# Patient Record
Sex: Male | Born: 1935 | Race: White | Hispanic: No | Marital: Married | Smoking: Former smoker
Health system: Southern US, Community
[De-identification: ages and names within clinical notes are randomized; demographics above are authoritative.]

## PROBLEM LIST (undated history)

## (undated) DIAGNOSIS — F1011 Alcohol abuse, in remission: Secondary | ICD-10-CM

## (undated) DIAGNOSIS — Z8631 Personal history of diabetic foot ulcer: Secondary | ICD-10-CM

## (undated) DIAGNOSIS — Z9181 History of falling: Secondary | ICD-10-CM

## (undated) DIAGNOSIS — I5032 Chronic diastolic (congestive) heart failure: Principal | ICD-10-CM

## (undated) DIAGNOSIS — I10 Essential (primary) hypertension: Secondary | ICD-10-CM

## (undated) DIAGNOSIS — F419 Anxiety disorder, unspecified: Secondary | ICD-10-CM

## (undated) DIAGNOSIS — E785 Hyperlipidemia, unspecified: Secondary | ICD-10-CM

## (undated) DIAGNOSIS — N289 Disorder of kidney and ureter, unspecified: Secondary | ICD-10-CM

## (undated) DIAGNOSIS — Z993 Dependence on wheelchair: Secondary | ICD-10-CM

## (undated) DIAGNOSIS — K219 Gastro-esophageal reflux disease without esophagitis: Secondary | ICD-10-CM

## (undated) DIAGNOSIS — Z860100 Personal history of colon polyps, unspecified: Secondary | ICD-10-CM

## (undated) DIAGNOSIS — M5137 Other intervertebral disc degeneration, lumbosacral region: Secondary | ICD-10-CM

## (undated) DIAGNOSIS — M51379 Other intervertebral disc degeneration, lumbosacral region without mention of lumbar back pain or lower extremity pain: Secondary | ICD-10-CM

## (undated) DIAGNOSIS — K766 Portal hypertension: Secondary | ICD-10-CM

## (undated) DIAGNOSIS — E1142 Type 2 diabetes mellitus with diabetic polyneuropathy: Secondary | ICD-10-CM

## (undated) DIAGNOSIS — F32A Depression, unspecified: Secondary | ICD-10-CM

## (undated) DIAGNOSIS — F418 Other specified anxiety disorders: Secondary | ICD-10-CM

## (undated) DIAGNOSIS — E119 Type 2 diabetes mellitus without complications: Secondary | ICD-10-CM

## (undated) DIAGNOSIS — F329 Major depressive disorder, single episode, unspecified: Secondary | ICD-10-CM

## (undated) DIAGNOSIS — L97319 Non-pressure chronic ulcer of right ankle with unspecified severity: Secondary | ICD-10-CM

## (undated) DIAGNOSIS — I739 Peripheral vascular disease, unspecified: Secondary | ICD-10-CM

## (undated) DIAGNOSIS — Z8614 Personal history of Methicillin resistant Staphylococcus aureus infection: Secondary | ICD-10-CM

## (undated) DIAGNOSIS — Z794 Long term (current) use of insulin: Secondary | ICD-10-CM

## (undated) HISTORY — PX: LAPAROSCOPIC CHOLECYSTECTOMY: SUR755

## (undated) HISTORY — DX: Portal hypertension: K76.6

## (undated) HISTORY — DX: Alcohol abuse, in remission: F10.11

## (undated) HISTORY — DX: Other specified anxiety disorders: F41.8

## (undated) HISTORY — DX: Peripheral vascular disease, unspecified: I73.9

## (undated) HISTORY — DX: Chronic diastolic (congestive) heart failure: I50.32

## (undated) HISTORY — PX: TOE AMPUTATION: SHX809

---

## 1997-06-23 ENCOUNTER — Other Ambulatory Visit: Admission: RE | Admit: 1997-06-23 | Discharge: 1997-06-23 | Payer: Self-pay | Admitting: Cardiology

## 1997-08-31 ENCOUNTER — Other Ambulatory Visit: Admission: RE | Admit: 1997-08-31 | Discharge: 1997-08-31 | Payer: Self-pay | Admitting: Cardiology

## 1997-09-26 ENCOUNTER — Other Ambulatory Visit: Admission: RE | Admit: 1997-09-26 | Discharge: 1997-09-26 | Payer: Self-pay | Admitting: Cardiology

## 1998-04-17 ENCOUNTER — Inpatient Hospital Stay (HOSPITAL_COMMUNITY): Admission: EM | Admit: 1998-04-17 | Discharge: 1998-04-19 | Payer: Self-pay | Admitting: Emergency Medicine

## 1998-04-17 ENCOUNTER — Encounter: Payer: Self-pay | Admitting: Emergency Medicine

## 1998-04-18 ENCOUNTER — Encounter: Payer: Self-pay | Admitting: Cardiology

## 1998-08-17 ENCOUNTER — Ambulatory Visit (HOSPITAL_COMMUNITY): Admission: RE | Admit: 1998-08-17 | Discharge: 1998-08-17 | Payer: Self-pay | Admitting: Psychiatry

## 1999-01-24 ENCOUNTER — Encounter: Admission: RE | Admit: 1999-01-24 | Discharge: 1999-04-24 | Payer: Self-pay | Admitting: Internal Medicine

## 1999-07-17 ENCOUNTER — Encounter: Admission: RE | Admit: 1999-07-17 | Discharge: 1999-10-15 | Payer: Self-pay | Admitting: Internal Medicine

## 1999-08-21 ENCOUNTER — Encounter: Payer: Self-pay | Admitting: Specialist

## 1999-08-21 ENCOUNTER — Encounter: Admission: RE | Admit: 1999-08-21 | Discharge: 1999-08-21 | Payer: Self-pay | Admitting: Specialist

## 1999-10-17 ENCOUNTER — Encounter: Admission: RE | Admit: 1999-10-17 | Discharge: 2000-01-15 | Payer: Self-pay | Admitting: Internal Medicine

## 2000-03-05 ENCOUNTER — Encounter: Admission: RE | Admit: 2000-03-05 | Discharge: 2000-05-26 | Payer: Self-pay | Admitting: Internal Medicine

## 2000-06-11 ENCOUNTER — Encounter: Admission: RE | Admit: 2000-06-11 | Discharge: 2000-09-09 | Payer: Self-pay | Admitting: Internal Medicine

## 2000-10-01 ENCOUNTER — Encounter: Admission: RE | Admit: 2000-10-01 | Discharge: 2000-12-01 | Payer: Self-pay | Admitting: Internal Medicine

## 2001-03-11 ENCOUNTER — Encounter: Admission: RE | Admit: 2001-03-11 | Discharge: 2001-03-16 | Payer: Self-pay | Admitting: Internal Medicine

## 2001-06-10 ENCOUNTER — Encounter (HOSPITAL_BASED_OUTPATIENT_CLINIC_OR_DEPARTMENT_OTHER): Admission: RE | Admit: 2001-06-10 | Discharge: 2001-06-15 | Payer: Self-pay | Admitting: Internal Medicine

## 2001-09-07 ENCOUNTER — Encounter (HOSPITAL_BASED_OUTPATIENT_CLINIC_OR_DEPARTMENT_OTHER): Admission: RE | Admit: 2001-09-07 | Discharge: 2001-09-11 | Payer: Self-pay | Admitting: Internal Medicine

## 2001-11-25 ENCOUNTER — Encounter: Payer: Self-pay | Admitting: General Surgery

## 2001-11-30 ENCOUNTER — Ambulatory Visit (HOSPITAL_COMMUNITY): Admission: RE | Admit: 2001-11-30 | Discharge: 2001-12-01 | Payer: Self-pay | Admitting: General Surgery

## 2001-11-30 HISTORY — PX: INGUINAL HERNIA REPAIR: SUR1180

## 2001-12-28 ENCOUNTER — Encounter (HOSPITAL_BASED_OUTPATIENT_CLINIC_OR_DEPARTMENT_OTHER): Admission: RE | Admit: 2001-12-28 | Discharge: 2002-03-28 | Payer: Self-pay | Admitting: Internal Medicine

## 2002-05-28 ENCOUNTER — Encounter: Payer: Self-pay | Admitting: Cardiology

## 2002-05-28 ENCOUNTER — Encounter: Admission: RE | Admit: 2002-05-28 | Discharge: 2002-05-28 | Payer: Self-pay | Admitting: Cardiology

## 2002-09-21 ENCOUNTER — Ambulatory Visit (HOSPITAL_COMMUNITY): Admission: RE | Admit: 2002-09-21 | Discharge: 2002-09-21 | Payer: Self-pay | Admitting: Gastroenterology

## 2002-09-21 ENCOUNTER — Encounter (INDEPENDENT_AMBULATORY_CARE_PROVIDER_SITE_OTHER): Payer: Self-pay | Admitting: *Deleted

## 2002-09-21 HISTORY — PX: COLONOSCOPY WITH ESOPHAGOGASTRODUODENOSCOPY (EGD): SHX5779

## 2003-05-10 ENCOUNTER — Encounter (HOSPITAL_BASED_OUTPATIENT_CLINIC_OR_DEPARTMENT_OTHER): Admission: RE | Admit: 2003-05-10 | Discharge: 2003-05-16 | Payer: Self-pay | Admitting: Internal Medicine

## 2003-05-17 ENCOUNTER — Inpatient Hospital Stay (HOSPITAL_COMMUNITY): Admission: EM | Admit: 2003-05-17 | Discharge: 2003-05-24 | Payer: Self-pay | Admitting: Emergency Medicine

## 2003-06-21 ENCOUNTER — Encounter (HOSPITAL_BASED_OUTPATIENT_CLINIC_OR_DEPARTMENT_OTHER): Admission: RE | Admit: 2003-06-21 | Discharge: 2003-08-25 | Payer: Self-pay | Admitting: Internal Medicine

## 2003-10-18 ENCOUNTER — Inpatient Hospital Stay (HOSPITAL_COMMUNITY): Admission: EM | Admit: 2003-10-18 | Discharge: 2003-11-01 | Payer: Self-pay

## 2003-11-17 ENCOUNTER — Ambulatory Visit (HOSPITAL_COMMUNITY): Admission: RE | Admit: 2003-11-17 | Discharge: 2003-11-17 | Payer: Self-pay | Admitting: Internal Medicine

## 2004-05-22 ENCOUNTER — Encounter: Admission: RE | Admit: 2004-05-22 | Discharge: 2004-05-22 | Payer: Self-pay | Admitting: Internal Medicine

## 2004-05-23 ENCOUNTER — Ambulatory Visit (HOSPITAL_COMMUNITY): Admission: RE | Admit: 2004-05-23 | Discharge: 2004-05-23 | Payer: Self-pay | Admitting: Internal Medicine

## 2004-07-26 ENCOUNTER — Encounter (HOSPITAL_BASED_OUTPATIENT_CLINIC_OR_DEPARTMENT_OTHER): Admission: RE | Admit: 2004-07-26 | Discharge: 2004-10-09 | Payer: Self-pay | Admitting: Surgery

## 2005-04-02 ENCOUNTER — Ambulatory Visit (HOSPITAL_COMMUNITY): Admission: RE | Admit: 2005-04-02 | Discharge: 2005-04-02 | Payer: Self-pay | Admitting: Internal Medicine

## 2005-07-31 ENCOUNTER — Encounter (HOSPITAL_BASED_OUTPATIENT_CLINIC_OR_DEPARTMENT_OTHER): Admission: RE | Admit: 2005-07-31 | Discharge: 2005-10-29 | Payer: Self-pay | Admitting: Surgery

## 2005-08-02 ENCOUNTER — Ambulatory Visit (HOSPITAL_COMMUNITY): Admission: RE | Admit: 2005-08-02 | Discharge: 2005-08-02 | Payer: Self-pay | Admitting: Internal Medicine

## 2005-08-02 ENCOUNTER — Encounter: Payer: Self-pay | Admitting: Vascular Surgery

## 2005-10-30 ENCOUNTER — Encounter (HOSPITAL_BASED_OUTPATIENT_CLINIC_OR_DEPARTMENT_OTHER): Admission: RE | Admit: 2005-10-30 | Discharge: 2005-11-28 | Payer: Self-pay | Admitting: Surgery

## 2005-12-05 ENCOUNTER — Encounter (HOSPITAL_BASED_OUTPATIENT_CLINIC_OR_DEPARTMENT_OTHER): Admission: RE | Admit: 2005-12-05 | Discharge: 2006-01-06 | Payer: Self-pay | Admitting: Surgery

## 2006-01-16 ENCOUNTER — Encounter (HOSPITAL_BASED_OUTPATIENT_CLINIC_OR_DEPARTMENT_OTHER): Admission: RE | Admit: 2006-01-16 | Discharge: 2006-02-27 | Payer: Self-pay | Admitting: Surgery

## 2006-02-28 ENCOUNTER — Encounter (HOSPITAL_BASED_OUTPATIENT_CLINIC_OR_DEPARTMENT_OTHER): Admission: RE | Admit: 2006-02-28 | Discharge: 2006-03-03 | Payer: Self-pay | Admitting: Surgery

## 2006-03-07 ENCOUNTER — Encounter (HOSPITAL_BASED_OUTPATIENT_CLINIC_OR_DEPARTMENT_OTHER): Admission: RE | Admit: 2006-03-07 | Discharge: 2006-06-05 | Payer: Self-pay | Admitting: Surgery

## 2006-06-10 ENCOUNTER — Encounter (HOSPITAL_BASED_OUTPATIENT_CLINIC_OR_DEPARTMENT_OTHER): Admission: RE | Admit: 2006-06-10 | Discharge: 2006-09-08 | Payer: Self-pay | Admitting: Surgery

## 2006-09-03 ENCOUNTER — Encounter (HOSPITAL_BASED_OUTPATIENT_CLINIC_OR_DEPARTMENT_OTHER): Admission: RE | Admit: 2006-09-03 | Discharge: 2006-09-12 | Payer: Self-pay | Admitting: Surgery

## 2007-01-05 ENCOUNTER — Ambulatory Visit (HOSPITAL_COMMUNITY): Admission: RE | Admit: 2007-01-05 | Discharge: 2007-01-05 | Payer: Self-pay | Admitting: Internal Medicine

## 2007-01-27 ENCOUNTER — Ambulatory Visit (HOSPITAL_COMMUNITY): Admission: RE | Admit: 2007-01-27 | Discharge: 2007-01-27 | Payer: Self-pay | Admitting: Internal Medicine

## 2007-05-28 ENCOUNTER — Ambulatory Visit (HOSPITAL_COMMUNITY): Admission: RE | Admit: 2007-05-28 | Discharge: 2007-05-28 | Payer: Self-pay | Admitting: Internal Medicine

## 2007-07-06 ENCOUNTER — Ambulatory Visit (HOSPITAL_COMMUNITY): Admission: RE | Admit: 2007-07-06 | Discharge: 2007-07-06 | Payer: Self-pay | Admitting: Internal Medicine

## 2008-04-18 ENCOUNTER — Encounter (HOSPITAL_BASED_OUTPATIENT_CLINIC_OR_DEPARTMENT_OTHER): Admission: RE | Admit: 2008-04-18 | Discharge: 2008-07-17 | Payer: Self-pay | Admitting: General Surgery

## 2008-07-19 ENCOUNTER — Encounter (HOSPITAL_BASED_OUTPATIENT_CLINIC_OR_DEPARTMENT_OTHER): Admission: RE | Admit: 2008-07-19 | Discharge: 2008-10-17 | Payer: Self-pay | Admitting: Internal Medicine

## 2008-10-01 ENCOUNTER — Ambulatory Visit (HOSPITAL_COMMUNITY): Admission: RE | Admit: 2008-10-01 | Discharge: 2008-10-01 | Payer: Self-pay | Admitting: Internal Medicine

## 2008-10-19 ENCOUNTER — Encounter (HOSPITAL_BASED_OUTPATIENT_CLINIC_OR_DEPARTMENT_OTHER): Admission: RE | Admit: 2008-10-19 | Discharge: 2008-12-01 | Payer: Self-pay | Admitting: Internal Medicine

## 2008-12-22 ENCOUNTER — Inpatient Hospital Stay (HOSPITAL_COMMUNITY): Admission: EM | Admit: 2008-12-22 | Discharge: 2008-12-26 | Payer: Self-pay | Admitting: Emergency Medicine

## 2008-12-29 ENCOUNTER — Encounter (HOSPITAL_BASED_OUTPATIENT_CLINIC_OR_DEPARTMENT_OTHER): Admission: RE | Admit: 2008-12-29 | Discharge: 2009-02-27 | Payer: Self-pay | Admitting: Internal Medicine

## 2009-02-03 ENCOUNTER — Emergency Department (HOSPITAL_COMMUNITY): Admission: EM | Admit: 2009-02-03 | Discharge: 2009-02-03 | Payer: Self-pay | Admitting: Emergency Medicine

## 2009-03-09 ENCOUNTER — Ambulatory Visit: Payer: Self-pay | Admitting: Cardiovascular Disease

## 2009-03-09 ENCOUNTER — Inpatient Hospital Stay (HOSPITAL_COMMUNITY): Admission: EM | Admit: 2009-03-09 | Discharge: 2009-03-27 | Payer: Self-pay | Admitting: Emergency Medicine

## 2009-03-10 ENCOUNTER — Encounter (INDEPENDENT_AMBULATORY_CARE_PROVIDER_SITE_OTHER): Payer: Self-pay | Admitting: Internal Medicine

## 2009-04-12 ENCOUNTER — Encounter (HOSPITAL_BASED_OUTPATIENT_CLINIC_OR_DEPARTMENT_OTHER): Admission: RE | Admit: 2009-04-12 | Discharge: 2009-07-11 | Payer: Self-pay | Admitting: Internal Medicine

## 2009-07-19 ENCOUNTER — Encounter (HOSPITAL_BASED_OUTPATIENT_CLINIC_OR_DEPARTMENT_OTHER): Admission: RE | Admit: 2009-07-19 | Discharge: 2009-10-17 | Payer: Self-pay | Admitting: Internal Medicine

## 2009-10-19 ENCOUNTER — Encounter: Admission: RE | Admit: 2009-10-19 | Discharge: 2009-12-02 | Payer: Self-pay | Admitting: Internal Medicine

## 2009-11-09 ENCOUNTER — Ambulatory Visit (HOSPITAL_COMMUNITY): Admission: RE | Admit: 2009-11-09 | Discharge: 2009-11-09 | Payer: Self-pay | Admitting: Internal Medicine

## 2009-11-14 ENCOUNTER — Ambulatory Visit (HOSPITAL_COMMUNITY): Admission: RE | Admit: 2009-11-14 | Discharge: 2009-11-14 | Payer: Self-pay | Admitting: Internal Medicine

## 2009-11-21 ENCOUNTER — Encounter (HOSPITAL_BASED_OUTPATIENT_CLINIC_OR_DEPARTMENT_OTHER): Payer: Self-pay | Admitting: Internal Medicine

## 2009-11-21 ENCOUNTER — Ambulatory Visit (HOSPITAL_COMMUNITY): Admission: RE | Admit: 2009-11-21 | Discharge: 2009-11-21 | Payer: Self-pay | Admitting: Internal Medicine

## 2009-11-21 ENCOUNTER — Ambulatory Visit: Payer: Self-pay | Admitting: Cardiology

## 2009-12-05 ENCOUNTER — Encounter (HOSPITAL_BASED_OUTPATIENT_CLINIC_OR_DEPARTMENT_OTHER): Admission: RE | Admit: 2009-12-05 | Discharge: 2009-12-29 | Payer: Self-pay | Admitting: Internal Medicine

## 2009-12-15 ENCOUNTER — Emergency Department (HOSPITAL_COMMUNITY): Admission: EM | Admit: 2009-12-15 | Discharge: 2009-12-15 | Payer: Self-pay | Admitting: Emergency Medicine

## 2009-12-20 ENCOUNTER — Encounter (HOSPITAL_BASED_OUTPATIENT_CLINIC_OR_DEPARTMENT_OTHER): Payer: Self-pay | Admitting: General Surgery

## 2009-12-20 ENCOUNTER — Ambulatory Visit: Payer: Self-pay | Admitting: Cardiology

## 2009-12-20 ENCOUNTER — Ambulatory Visit (HOSPITAL_COMMUNITY): Admission: RE | Admit: 2009-12-20 | Discharge: 2009-12-20 | Payer: Self-pay | Admitting: General Surgery

## 2009-12-20 HISTORY — PX: TRANSTHORACIC ECHOCARDIOGRAM: SHX275

## 2010-01-01 ENCOUNTER — Encounter (HOSPITAL_BASED_OUTPATIENT_CLINIC_OR_DEPARTMENT_OTHER): Admission: RE | Admit: 2010-01-01 | Discharge: 2010-01-17 | Payer: Self-pay | Admitting: Internal Medicine

## 2010-01-02 ENCOUNTER — Encounter: Payer: Self-pay | Admitting: Endocrinology

## 2010-01-15 ENCOUNTER — Ambulatory Visit: Payer: Self-pay | Admitting: Endocrinology

## 2010-01-15 DIAGNOSIS — K219 Gastro-esophageal reflux disease without esophagitis: Secondary | ICD-10-CM

## 2010-01-15 DIAGNOSIS — F1011 Alcohol abuse, in remission: Secondary | ICD-10-CM | POA: Insufficient documentation

## 2010-01-15 DIAGNOSIS — I10 Essential (primary) hypertension: Secondary | ICD-10-CM | POA: Insufficient documentation

## 2010-01-15 DIAGNOSIS — E785 Hyperlipidemia, unspecified: Secondary | ICD-10-CM | POA: Insufficient documentation

## 2010-01-15 DIAGNOSIS — I739 Peripheral vascular disease, unspecified: Secondary | ICD-10-CM

## 2010-01-15 DIAGNOSIS — K766 Portal hypertension: Secondary | ICD-10-CM

## 2010-01-15 DIAGNOSIS — E1142 Type 2 diabetes mellitus with diabetic polyneuropathy: Secondary | ICD-10-CM

## 2010-01-15 DIAGNOSIS — D649 Anemia, unspecified: Secondary | ICD-10-CM

## 2010-01-15 DIAGNOSIS — L97409 Non-pressure chronic ulcer of unspecified heel and midfoot with unspecified severity: Secondary | ICD-10-CM | POA: Insufficient documentation

## 2010-01-15 DIAGNOSIS — E876 Hypokalemia: Secondary | ICD-10-CM

## 2010-01-15 DIAGNOSIS — E109 Type 1 diabetes mellitus without complications: Secondary | ICD-10-CM | POA: Insufficient documentation

## 2010-01-15 DIAGNOSIS — E871 Hypo-osmolality and hyponatremia: Secondary | ICD-10-CM | POA: Insufficient documentation

## 2010-01-15 DIAGNOSIS — R0989 Other specified symptoms and signs involving the circulatory and respiratory systems: Secondary | ICD-10-CM | POA: Insufficient documentation

## 2010-01-15 DIAGNOSIS — F329 Major depressive disorder, single episode, unspecified: Secondary | ICD-10-CM

## 2010-01-15 DIAGNOSIS — M545 Low back pain: Secondary | ICD-10-CM

## 2010-01-15 HISTORY — DX: Alcohol abuse, in remission: F10.11

## 2010-01-15 HISTORY — DX: Peripheral vascular disease, unspecified: I73.9

## 2010-01-15 HISTORY — DX: Portal hypertension: K76.6

## 2010-02-07 ENCOUNTER — Encounter: Payer: Self-pay | Admitting: Endocrinology

## 2010-02-12 ENCOUNTER — Ambulatory Visit: Payer: Self-pay | Admitting: Endocrinology

## 2010-02-19 ENCOUNTER — Inpatient Hospital Stay (HOSPITAL_COMMUNITY)
Admission: EM | Admit: 2010-02-19 | Discharge: 2010-02-22 | Payer: Self-pay | Source: Home / Self Care | Attending: Internal Medicine | Admitting: Internal Medicine

## 2010-02-27 ENCOUNTER — Inpatient Hospital Stay (HOSPITAL_COMMUNITY)
Admission: EM | Admit: 2010-02-27 | Discharge: 2010-03-07 | Payer: Self-pay | Source: Home / Self Care | Attending: Internal Medicine | Admitting: Internal Medicine

## 2010-03-07 LAB — GLUCOSE, CAPILLARY
Glucose-Capillary: 112 mg/dL — ABNORMAL HIGH (ref 70–99)
Glucose-Capillary: 172 mg/dL — ABNORMAL HIGH (ref 70–99)
Glucose-Capillary: 129 mg/dL — ABNORMAL HIGH (ref 70–99)
Glucose-Capillary: 145 mg/dL — ABNORMAL HIGH (ref 70–99)

## 2010-03-22 ENCOUNTER — Encounter: Payer: Self-pay | Admitting: Endocrinology

## 2010-03-22 ENCOUNTER — Ambulatory Visit
Admission: RE | Admit: 2010-03-22 | Discharge: 2010-03-22 | Payer: Self-pay | Source: Home / Self Care | Attending: Endocrinology | Admitting: Endocrinology

## 2010-03-25 ENCOUNTER — Encounter (HOSPITAL_BASED_OUTPATIENT_CLINIC_OR_DEPARTMENT_OTHER): Payer: Self-pay | Admitting: Internal Medicine

## 2010-04-05 NOTE — Assessment & Plan Note (Signed)
Summary: 1 MO ROV /NWS #   Vital Signs:  Patient profile:   77 year old male Height:      74 inches (187.96 cm) Weight:      281 pounds (127.73 kg) BMI:     36.21 O2 Sat:      92 % on Room air Temp:     98.3 degrees F (36.83 degrees C) oral Pulse rate:   72 / minute Pulse rhythm:   regular BP sitting:   128 / 72  (left arm) Cuff size:   large  Vitals Entered By: Brenton Grills CMA Duncan Dull) (March 22, 2010 11:31 AM)  O2 Flow:  Room air CC: 1 month F/U/pt is no longer taking Hydroxyine, Lisinopril 20mg , Clonidine, Ceftin, or using Systane eye drops.aj Is Patient Diabetic? Yes   Primary Provider:  Baltazar Najjar MD  CC:  1 month F/U/pt is no longer taking Hydroxyine, Lisinopril 20mg , Clonidine, Ceftin, and or using Systane eye drops.aj.  History of Present Illness: he brings a record of his cbg's from [facility], which i have reviewed today.  despite increasing lantus to 80 units two times a day, he continues to require approx 40 units of as needed novolog, total per day.  pt states he feels well in general.    Current Medications (verified): 1)  Silvasorb  Gel (Wound Dressings) .... Apply To Affected Areas Once Daily 2)  Amitriptyline Hcl 50 Mg Tabs (Amitriptyline Hcl) .Marland Kitchen.. 1 By Mouth At Bedtime 3)  Lexapro 10 Mg Tabs (Escitalopram Oxalate) .Marland Kitchen.. 1 By Mouth Once Daily 4)  Hydroxyzine Hcl 25 Mg Tabs (Hydroxyzine Hcl) .Marland Kitchen.. 1 Every 6 Hours As Needed For Itching 5)  Imdur 60 Mg Xr24h-Tab (Isosorbide Mononitrate) .Marland Kitchen.. 1 By Mouth Once Daily 6)  Lopressor 100 Mg Tabs (Metoprolol Tartrate) .Marland Kitchen.. 1 By Mouth Two Times A Day (150mg ) 7)  Ativan 0.5 Mg Tabs (Lorazepam) .Marland Kitchen.. 1 Every 8 Hours As Needed For Anxiety 8)  Lantus 100 Unit/ml Soln (Insulin Glargine) .... 25 Units At Bedtime 9)  Novolog 100 Unit/ml Soln (Insulin Aspart) .... Before Meals and At Bedtime Sliding Scale 15-20-23-25-28 Units 10)  Zyprexa 5 Mg Tabs (Olanzapine) .Marland Kitchen.. 1 By Mouth At Bedtime 11)  Aspirin Ec 81 Mg Tbec  (Aspirin) .Marland Kitchen.. 1 By Mouth Once Daily 12)  Neurontin 600 Mg Tabs (Gabapentin) .Marland Kitchen.. 1 By Mouth Two Times A Day 13)  Oxycodone Hcl 5 Mg Tabs (Oxycodone Hcl) .Marland Kitchen.. 1 Every 4 Hours As Needed For Pain 14)  Oxycontin 30 Mg Xr12h-Tab (Oxycodone Hcl) .Marland Kitchen.. 1 By Mouth Every 12 Hours As Needed For Pain 15)  Artificial Tears  Soln (Artificial Tear Solution) .Marland Kitchen.. 1 Drop in Each Each Eye Every 6 Hours While Awake 16)  Decubi-Vite  Caps (Multiple Vitamins-Minerals) .... 2 By Mouth Once Daily 17)  Ferrous Sulfate 325 (65 Fe) Mg Tabs (Ferrous Sulfate) .Marland Kitchen.. 1 By Mouth Once Daily 18)  Folic Acid 1 Mg Tabs (Folic Acid) .Marland Kitchen.. 1 By Mouth Once Daily 19)  Lidoderm 5 % Ptch (Lidocaine) .Marland Kitchen.. 1 Patch Every 12 Hours 20)  Lisinopril 40 Mg Tabs (Lisinopril) .Marland Kitchen.. 1 By Mouth Once Daily 21)  Loratadine 10 Mg Tabs (Loratadine) .Marland Kitchen.. 1 By Mouth Once Daily 22)  Milk of Magnesia 400 Mg/63ml Susp (Magnesium Hydroxide) .... As Needed 23)  Miralax  Powd (Polyethylene Glycol 3350) .Marland KitchenMarland KitchenMarland Kitchen 17 Grams Once Daily 24)  Oxybutynin Chloride 10 Mg Xr24h-Tab (Oxybutynin Chloride) .Marland Kitchen.. 1 By Mouth At Bedtime 25)  Senna Lax 8.6 Mg Tabs (Sennosides) .... 2  Tablets By Mouth At Bedtime 26)  Systane Ultra 0.4-0.3 % Soln (Polyethyl Glycol-Propyl Glycol) .Marland Kitchen.. 1 Drop in Each Eye Three Times A Day 27)  Zantac 150 Mg Tabs (Ranitidine Hcl) .Marland Kitchen.. 1 By Mouth At Bedtime 28)  Albuterol Sulfate 0.63 Mg/66ml Nebu (Albuterol Sulfate) .... As Needed Every 4 Hours 29)  Lisinopril 20 Mg Tabs (Lisinopril) .Marland Kitchen.. 1 Tablet By Mouth Once Daily 30)  Clonidine Hcl 0.1 Mg Tabs (Clonidine Hcl) .Marland Kitchen.. 1 Tablet Every 6 Hours As Needed 31)  Furosemide 40 Mg Tabs (Furosemide) .Marland Kitchen.. 1 1/2  Tablets (60mg ) By Mouth Once Daily 32)  Tramadol Hcl 50 Mg  Tabs (Tramadol Hcl) .Marland Kitchen.. 1 Tablet Every 6 Hours As Needed For Pain 33)  Ipratropium Bromide 0.02 % Soln (Ipratropium Bromide) .... As Needed Every 4 Hours For Sob 34)  Hydralazine Hcl 25 Mg Tabs (Hydralazine Hcl) .Marland Kitchen.. 1 Tablet By Mouth Every 6  Hours  Allergies (verified): No Known Drug Allergies  Past History:  Past Medical History: Last updated: 01/15/2010 ANEMIA (ICD-285.9) DIABETIC FOOT ULCER (ICD-707.14) UNSPECIFIED PERIPHERAL VASCULAR DISEASE (ICD-443.9) BACK PAIN, LUMBAR (ICD-724.2) POLYNEUROPATHY (ICD-357.9) HYPONATREMIA (ICD-276.1) DEPRESSION (ICD-311) HYPERTENSION (ICD-401.9) PORTAL HYPERTENSION (ICD-572.3) ALCOHOL ABUSE, IN REMISSION (ICD-305.03) HYPOKALEMIA (ICD-276.8) DYSLIPIDEMIA (ICD-272.4) GERD (ICD-530.81) IDDM (ICD-250.01)  Review of Systems  The patient denies hypoglycemia.    Physical Exam  General:  morbidly obese.  no distress.  in wheelchair Lungs:  Clear to auscultation bilaterally, except for rales at the left base (unchanged). Normal respiratory effort.    Impression & Recommendations:  Problem # 1:  IDDM (ICD-250.01) his cbg's are still too high to see patterns throughout the day.    Medications Added to Medication List This Visit: 1)  Novolog 100 Unit/ml Soln (Insulin aspart) .... Before meals and at bedtime sliding scale 2)  Oxycontin 30 Mg Xr12h-tab (Oxycodone hcl) .Marland Kitchen.. 1 by mouth every 12 hours as needed for pain 3)  Furosemide 40 Mg Tabs (Furosemide) .Marland Kitchen.. 1 1/2  tablets (60mg ) by mouth once daily 4)  Tramadol Hcl 50 Mg Tabs (Tramadol hcl) .Marland Kitchen.. 1 tablet every 6 hours as needed for pain 5)  Ipratropium Bromide 0.02 % Soln (Ipratropium bromide) .... As needed every 4 hours for sob 6)  Hydralazine Hcl 25 Mg Tabs (Hydralazine hcl) .Marland Kitchen.. 1 tablet by mouth every 6 hours 7)  Lantus 100 Unit/ml Soln (Insulin glargine) .... 90 units two times a day  Other Orders: Est. Patient Level III (13244)  Patient Instructions: 1)  check your blood sugar 4 times a day--before the 3 meals, and at bedtime.  also check if you have symptoms of your blood sugar being too high or too low.  please keep a record of the readings and bring it to your next appointment here.  please call [place] sooner if  resident is having cbg < 100. 2)  for now, increase lantus to 90 units two times a day 3)  same prn novolog for now. 4)  Please schedule a follow-up appointment in 2-3 weeks.   Orders Added: 1)  Est. Patient Level III [01027]  Appended Document: 1 MO ROV /NWS # on the way out of the office after appointment, pt stumbles and abrades right forearm.   dressing applied.

## 2010-04-05 NOTE — Assessment & Plan Note (Signed)
Summary: NEW ENDO CONSULT/ DM / MEDICARE/BCBS/MEDICAID [place] /NWS  #   Vital Signs:  Patient profile:   77 year old male Height:      74 inches (187.96 cm) Weight:      292.25 pounds (132.84 kg) BMI:     37.66 O2 Sat:      91 % on Room air Temp:     98.3 degrees F (36.83 degrees C) oral Pulse rate:   67 / minute BP sitting:   146 / 82  (left arm) Cuff size:   large  Vitals Entered By: Brenton Grills CMA Duncan Dull) (January 15, 2010 2:42 PM)  O2 Flow:  Room air CC: New Endo/DM/[facility]/aj   Primary [name]:  Baltazar Najjar MD  CC:  New Endo/DM/[facility]/aj.  History of Present Illness: pt states many years h/o dm.  he has several chronic complications, as liosted under [facility].  he has been on insulin x "a few years."  he takes lantus and prn novolog.  my nurse has obtained a cbg record. it varies from high-100's to mid-300's.   pt says his diet is provided to him at [facility].  his exercise is severely limited by being confined to a wheelchair.   symptomatically, pt states a few years of mild easy bruising at the forearms, but no assoc pain.  Current Medications (verified): 1)  Silvasorb  Gel (Wound Dressings) .... Apply To Affected Areas Once Daily 2)  Amitriptyline Hcl 50 Mg Tabs (Amitriptyline Hcl) .Marland Kitchen.. 1 By Mouth At Bedtime 3)  Lexapro 10 Mg Tabs (Escitalopram Oxalate) .Marland Kitchen.. 1 By Mouth Once Daily 4)  Hydroxyzine Hcl 25 Mg Tabs (Hydroxyzine Hcl) .Marland Kitchen.. 1 Every 6 Hours As Needed For Itching 5)  Imdur 60 Mg Xr24h-Tab (Isosorbide Mononitrate) .Marland Kitchen.. 1 By Mouth Once Daily 6)  Lopressor 100 Mg Tabs (Metoprolol Tartrate) .Marland Kitchen.. 1 By Mouth Two Times A Day 7)  Ativan 0.5 Mg Tabs (Lorazepam) .Marland Kitchen.. 1 Every 8 Hours As Needed For Anxiety 8)  Lantus 100 Unit/ml Soln (Insulin Glargine) .... 50 Units Two Times A Day 9)  Novolog 100 Unit/ml Soln (Insulin Aspart) .... Before Meals and At Bedtime Sliding Scale 15-20-23-25-28 Units 10)  Zyprexa 5 Mg Tabs (Olanzapine) .Marland Kitchen.. 1 By Mouth At  Bedtime 11)  Aspirin Ec 81 Mg Tbec (Aspirin) .Marland Kitchen.. 1 By Mouth Once Daily 12)  Neurontin 600 Mg Tabs (Gabapentin) .Marland Kitchen.. 1 By Mouth Two Times A Day 13)  Oxycodone Hcl 5 Mg Tabs (Oxycodone Hcl) .Marland Kitchen.. 1 Every 4 Hours As Needed For Pain 14)  Oxycontin 15 Mg Xr12h-Tab (Oxycodone Hcl) .Marland Kitchen.. 1 By Mouth Every 12 Hours 15)  Artificial Tears  Soln (Artificial Tear Solution) .Marland Kitchen.. 1 Drop in Each Each Eye Every 6 Hours While Awake 16)  Decubi-Vite  Caps (Multiple Vitamins-Minerals) .... 2 By Mouth Once Daily 17)  Ferrous Sulfate 325 (65 Fe) Mg Tabs (Ferrous Sulfate) .Marland Kitchen.. 1 By Mouth Once Daily 18)  Folic Acid 1 Mg Tabs (Folic Acid) .Marland Kitchen.. 1 By Mouth Once Daily 19)  Lidoderm 5 % Ptch (Lidocaine) .Marland Kitchen.. 1 Patch Every 12 Hours 20)  Lisinopril 40 Mg Tabs (Lisinopril) .Marland Kitchen.. 1 By Mouth Once Daily 21)  Loratadine 10 Mg Tabs (Loratadine) .Marland Kitchen.. 1 By Mouth Once Daily 22)  Milk of Magnesia 400 Mg/69ml Susp (Magnesium Hydroxide) .... As Needed 23)  Miralax  Powd (Polyethylene Glycol 3350) .Marland KitchenMarland KitchenMarland Kitchen 17 Grams Once Daily 24)  Oxybutynin Chloride 10 Mg Xr24h-Tab (Oxybutynin Chloride) .Marland Kitchen.. 1 By Mouth At Bedtime 25)  Senna Lax 8.6  Mg Tabs (Sennosides) .... 2 Tablets By Mouth At Bedtime 26)  Systane Ultra 0.4-0.3 % Soln (Polyethyl Glycol-Propyl Glycol) .Marland Kitchen.. 1 Drop in Each Eye Three Times A Day 27)  Zantac 150 Mg Tabs (Ranitidine Hcl) .Marland Kitchen.. 1 By Mouth At Bedtime 28)  Albuterol Sulfate 0.63 Mg/25ml Nebu (Albuterol Sulfate) .... As Needed Every 4 Hours  Allergies (verified): No Known Drug Allergies  Past History:  Past Medical History: ANEMIA (ICD-285.9) DIABETIC FOOT ULCER (ICD-707.14) UNSPECIFIED PERIPHERAL VASCULAR DISEASE (ICD-443.9) BACK PAIN, LUMBAR (ICD-724.2) POLYNEUROPATHY (ICD-357.9) HYPONATREMIA (ICD-276.1) DEPRESSION (ICD-311) HYPERTENSION (ICD-401.9) PORTAL HYPERTENSION (ICD-572.3) ALCOHOL ABUSE, IN REMISSION (ICD-305.03) HYPOKALEMIA (ICD-276.8) DYSLIPIDEMIA (ICD-272.4) GERD (ICD-530.81) IDDM (ICD-250.01)  Family  History: Reviewed history and no changes required. no dm in immediate family  Social History: Reviewed history and no changes required. lives at [facility] married retired  Review of Systems       The patient complains of weight gain and headaches.  The patient denies fever.         denies blurry vision, chest pain, n/v, urinary frequency, cramps, excessive diaphoresis, memory loss, depression, hypoglycemia, and rhinorrhea.  he has chronic sob.  Physical Exam  General:  morbidly obese.  no distress.  in wheelchair Head:  head: no deformity eyes: no periorbital swelling, no proptosis external nose and ears are normal mouth: no lesion seen Neck:  Supple without thyroid enlargement or tenderness.  Lungs:  Clear to auscultation bilaterally, except for rales at the left base. Normal respiratory effort.  Heart:  Regular rate and rhythm without gallops noted. Normal S1,S2.   there is a soft syst murmur Msk:  muscle bulk is grossly normal, but strength is diffusely 4/5.  no obvious joint swelling.   Pulses:  no carotid bruit  Extremities:  legs are bandaged trace right pedal edema and trace left pedal edema.  trace right pedal edema, trace left pedal edema, and mycotic toenails.   Neurologic:  cn 2-12 grossly intact.   readily moves all 4's.   sensation is intact to touch on the feet  Skin:  normal texture and temp.  no rash.  not diaphoretic  Cervical Nodes:  No significant adenopathy.  Psych:  Alert and cooperative; normal mood and affect; normal attention span and concentration.     Impression & Recommendations:  Problem # 1:  IDDM (ICD-250.01) needs increased rx.  Problem # 2:  ABNORMAL CHEST SOUNDS (ICD-786.7) ? new incidental finding  Problem # 3:  DEPRESSION (ICD-311) this complicates the rx of #1, although his residing at [facility] helps a lot  Medications Added to Medication List This Visit: 1)  Silvasorb Gel (Wound dressings) .... Apply to affected  areas once daily 2)  Amitriptyline Hcl 50 Mg Tabs (Amitriptyline hcl) .Marland Kitchen.. 1 by mouth at bedtime 3)  Lexapro 10 Mg Tabs (Escitalopram oxalate) .Marland Kitchen.. 1 by mouth once daily 4)  Hydroxyzine Hcl 25 Mg Tabs (Hydroxyzine hcl) .Marland Kitchen.. 1 every 6 hours as needed for itching 5)  Imdur 60 Mg Xr24h-tab (Isosorbide mononitrate) .Marland Kitchen.. 1 by mouth once daily 6)  Lopressor 100 Mg Tabs (Metoprolol tartrate) .Marland Kitchen.. 1 by mouth two times a day 7)  Ativan 0.5 Mg Tabs (Lorazepam) .Marland Kitchen.. 1 every 8 hours as needed for anxiety 8)  Lantus 100 Unit/ml Soln (Insulin glargine) .... 65 units two times a day 9)  Novolog 100 Unit/ml Soln (Insulin aspart) .... Before meals and at bedtime sliding scale 15-20-23-25-28 units 10)  Zyprexa 5 Mg Tabs (Olanzapine) .Marland Kitchen.. 1 by mouth at bedtime 11)  Aspirin Ec 81 Mg Tbec (Aspirin) .Marland Kitchen.. 1 by mouth once daily 12)  Neurontin 600 Mg Tabs (Gabapentin) .Marland Kitchen.. 1 by mouth two times a day 13)  Oxycodone Hcl 5 Mg Tabs (Oxycodone hcl) .Marland Kitchen.. 1 every 4 hours as needed for pain 14)  Oxycontin 15 Mg Xr12h-tab (Oxycodone hcl) .Marland Kitchen.. 1 by mouth every 12 hours 15)  Artificial Tears Soln (Artificial tear solution) .Marland Kitchen.. 1 drop in each each eye every 6 hours while awake 16)  Decubi-vite Caps (Multiple vitamins-minerals) .... 2 by mouth once daily 17)  Ferrous Sulfate 325 (65 Fe) Mg Tabs (Ferrous sulfate) .Marland Kitchen.. 1 by mouth once daily 18)  Folic Acid 1 Mg Tabs (Folic acid) .Marland Kitchen.. 1 by mouth once daily 19)  Lidoderm 5 % Ptch (Lidocaine) .Marland Kitchen.. 1 patch every 12 hours 20)  Lisinopril 40 Mg Tabs (Lisinopril) .Marland Kitchen.. 1 by mouth once daily 21)  Loratadine 10 Mg Tabs (Loratadine) .Marland Kitchen.. 1 by mouth once daily 22)  Milk of Magnesia 400 Mg/38ml Susp (Magnesium hydroxide) .... As needed 23)  Miralax Powd (Polyethylene glycol 3350) .Marland KitchenMarland KitchenMarland Kitchen 17 grams once daily 24)  Oxybutynin Chloride 10 Mg Xr24h-tab (Oxybutynin chloride) .Marland Kitchen.. 1 by mouth at bedtime 25)  Senna Lax 8.6 Mg Tabs (Sennosides) .... 2 tablets by mouth at bedtime 26)  Systane Ultra 0.4-0.3  % Soln (Polyethyl glycol-propyl glycol) .Marland Kitchen.. 1 drop in each eye three times a day 27)  Zantac 150 Mg Tabs (Ranitidine hcl) .Marland Kitchen.. 1 by mouth at bedtime 28)  Albuterol Sulfate 0.63 Mg/63ml Nebu (Albuterol sulfate) .... As needed every 4 hours  Other Orders: New Patient Level IV (04540)  Patient Instructions: 1)  good diet and exercise habits significanly improve the control of your diabetes.  please let me know if you wish to be referred to a dietician.  high blood sugar is very risky to your health.  you should see an eye doctor every year. 2)  controlling your blood pressure and cholesterol drastically reduces the damage diabetes does to your body.  this also applies to quitting smoking.  please discuss these with your doctor.  you should take an aspirin every day, unless you have been advised by a doctor not to. 3)  check your blood sugar 4 times a day--before the 3 meals, and at bedtime.  also check if you have symptoms of your blood sugar being too high or too low.  please keep a record of the readings and bring it to your next appointment here.  please call us sooner if you are having low blood sugar episodes. 4)  we will need to take this complex situation in stages 5)  for now, increase lantus to 65 units two times a day 6)  same novolog for now. 7)  the rales at the left base need follow-up, if this is a new finding. 8)  Please schedule a follow-up appointment in 1 month.   Orders Added: 1)  New Patient Level IV [[id number]]

## 2010-04-05 NOTE — Miscellaneous (Signed)
Summary: [facility]   Imported By: Sherian Rein 01/19/2010 10:41:22  _____________________________________________________________________  External Attachment:    Type:   Image     Comment:   External Document

## 2010-04-05 NOTE — Assessment & Plan Note (Signed)
Summary: 1 MTH FU---STC   Vital Signs:  Patient profile:   77 year old male Height:      74 inches (187.96 cm) Weight:      292.19 pounds (132.81 kg) BMI:     37.65 O2 Sat:      93 % on Room air Temp:     97.7 degrees F (36.50 degrees C) oral Pulse rate:   86 / minute BP sitting:   160 / 90  (right arm) Cuff size:   large  Vitals Entered By: Brenton Grills CMA Duncan Dull) (February 13, 2010 8:45 AM)  O2 Flow:  Room air CC: 1 month F/U/aj Is Patient Diabetic? Yes   Primary Provider:  Baltazar Najjar MD  CC:  1 month F/U/aj.  History of Present Illness: pt states he feels well in general.   ashley, ma, has called [facility], and obtained a cbg record, which i have reviwed today.  it varies from 170-300, with no trend throughout the day.    Current Medications (verified): 1)  Silvasorb  Gel (Wound Dressings) .... Apply To Affected Areas Once Daily 2)  Amitriptyline Hcl 50 Mg Tabs (Amitriptyline Hcl) .Marland Kitchen.. 1 By Mouth At Bedtime 3)  Lexapro 10 Mg Tabs (Escitalopram Oxalate) .Marland Kitchen.. 1 By Mouth Once Daily 4)  Hydroxyzine Hcl 25 Mg Tabs (Hydroxyzine Hcl) .Marland Kitchen.. 1 Every 6 Hours As Needed For Itching 5)  Imdur 60 Mg Xr24h-Tab (Isosorbide Mononitrate) .Marland Kitchen.. 1 By Mouth Once Daily 6)  Lopressor 100 Mg Tabs (Metoprolol Tartrate) .Marland Kitchen.. 1 By Mouth Two Times A Day (150mg ) 7)  Ativan 0.5 Mg Tabs (Lorazepam) .Marland Kitchen.. 1 Every 8 Hours As Needed For Anxiety 8)  Lantus 100 Unit/ml Soln (Insulin Glargine) .... 65 Units Two Times A Day 9)  Novolog 100 Unit/ml Soln (Insulin Aspart) .... Before Meals and At Bedtime Sliding Scale 15-20-23-25-28 Units 10)  Zyprexa 5 Mg Tabs (Olanzapine) .Marland Kitchen.. 1 By Mouth At Bedtime 11)  Aspirin Ec 81 Mg Tbec (Aspirin) .Marland Kitchen.. 1 By Mouth Once Daily 12)  Neurontin 600 Mg Tabs (Gabapentin) .Marland Kitchen.. 1 By Mouth Two Times A Day 13)  Oxycodone Hcl 5 Mg Tabs (Oxycodone Hcl) .Marland Kitchen.. 1 Every 4 Hours As Needed For Pain 14)  Oxycontin 15 Mg Xr12h-Tab (Oxycodone Hcl) .Marland Kitchen.. 1 By Mouth Every 12 Hours 15)   Artificial Tears  Soln (Artificial Tear Solution) .Marland Kitchen.. 1 Drop in Each Each Eye Every 6 Hours While Awake 16)  Decubi-Vite  Caps (Multiple Vitamins-Minerals) .... 2 By Mouth Once Daily 17)  Ferrous Sulfate 325 (65 Fe) Mg Tabs (Ferrous Sulfate) .Marland Kitchen.. 1 By Mouth Once Daily 18)  Folic Acid 1 Mg Tabs (Folic Acid) .Marland Kitchen.. 1 By Mouth Once Daily 19)  Lidoderm 5 % Ptch (Lidocaine) .Marland Kitchen.. 1 Patch Every 12 Hours 20)  Lisinopril 40 Mg Tabs (Lisinopril) .Marland Kitchen.. 1 By Mouth Once Daily 21)  Loratadine 10 Mg Tabs (Loratadine) .Marland Kitchen.. 1 By Mouth Once Daily 22)  Milk of Magnesia 400 Mg/22ml Susp (Magnesium Hydroxide) .... As Needed 23)  Miralax  Powd (Polyethylene Glycol 3350) .Marland KitchenMarland KitchenMarland Kitchen 17 Grams Once Daily 24)  Oxybutynin Chloride 10 Mg Xr24h-Tab (Oxybutynin Chloride) .Marland Kitchen.. 1 By Mouth At Bedtime 25)  Senna Lax 8.6 Mg Tabs (Sennosides) .... 2 Tablets By Mouth At Bedtime 26)  Systane Ultra 0.4-0.3 % Soln (Polyethyl Glycol-Propyl Glycol) .Marland Kitchen.. 1 Drop in Each Eye Three Times A Day 27)  Zantac 150 Mg Tabs (Ranitidine Hcl) .Marland Kitchen.. 1 By Mouth At Bedtime 28)  Albuterol Sulfate 0.63 Mg/26ml Nebu (Albuterol Sulfate) .... As  Needed Every 4 Hours 29)  Lisinopril 20 Mg Tabs (Lisinopril) .Marland Kitchen.. 1 Tablet By Mouth Once Daily 30)  Clonidine Hcl 0.1 Mg Tabs (Clonidine Hcl) .Marland Kitchen.. 1 Tablet Every 6 Hours As Needed 31)  Furosemide 40 Mg Tabs (Furosemide) .Marland Kitchen.. 1 Tablet By Mouth Once Daily 32)  Ceftin 500 Mg Tabs (Cefuroxime Axetil) .Marland Kitchen.. 1 Tablet Two Times A Day X 10 Days  Allergies (verified): No Known Drug Allergies  Past History:  Past Medical History: Last updated: 01/15/2010 ANEMIA (ICD-285.9) DIABETIC FOOT ULCER (ICD-707.14) UNSPECIFIED PERIPHERAL VASCULAR DISEASE (ICD-443.9) BACK PAIN, LUMBAR (ICD-724.2) POLYNEUROPATHY (ICD-357.9) HYPONATREMIA (ICD-276.1) DEPRESSION (ICD-311) HYPERTENSION (ICD-401.9) PORTAL HYPERTENSION (ICD-572.3) ALCOHOL ABUSE, IN REMISSION (ICD-305.03) HYPOKALEMIA (ICD-276.8) DYSLIPIDEMIA (ICD-272.4) GERD  (ICD-530.81) IDDM (ICD-250.01)  Review of Systems  The patient denies hypoglycemia.    Physical Exam  General:  morbidly obese.  no distress.  in wheelchair Skin:  injection sites at the anterior abdomen are without lesions.     Impression & Recommendations:  Problem # 1:  IDDM (ICD-250.01) needs increased rx  Medications Added to Medication List This Visit: 1)  Lopressor 100 Mg Tabs (Metoprolol tartrate) .Marland Kitchen.. 1 by mouth two times a day (150mg ) 2)  Lantus 100 Unit/ml Soln (Insulin glargine) .... 80 units two times a day 3)  Lisinopril 20 Mg Tabs (Lisinopril) .Marland Kitchen.. 1 tablet by mouth once daily 4)  Clonidine Hcl 0.1 Mg Tabs (Clonidine hcl) .Marland Kitchen.. 1 tablet every 6 hours as needed 5)  Furosemide 40 Mg Tabs (Furosemide) .Marland Kitchen.. 1 tablet by mouth once daily 6)  Ceftin 500 Mg Tabs (Cefuroxime axetil) .Marland Kitchen.. 1 tablet two times a day x 10 days  Other Orders: Est. Patient Level III (16109)  Patient Instructions: 1)  check your blood sugar 4 times a day--before the 3 meals, and at bedtime.  also check if you have symptoms of your blood sugar being too high or too low.  please keep a record of the readings and bring it to your next appointment here.  please call [place] sooner if resident is having cbg < 100. 2)  please note it is important to check cbg at hs, as well as qac. 3)  we will need to take this complex situation in stages. 4)  for now, increase lantus to 80 units two times a day 5)  same novolog for now. 6)  Please schedule a follow-up appointment in 1 month.   Orders Added: 1)  Est. Patient Level III [[id number]]

## 2010-04-05 NOTE — Miscellaneous (Signed)
Summary: [facility]   Imported By: Lester Dwight [place] 03/27/2010 12:30:51  _____________________________________________________________________  External Attachment:    Type:   Image     Comment:   External Document

## 2010-04-05 NOTE — Letter (Signed)
Summary: [facility]   [facility]   Imported By: Sherian Rein 02/16/2010 09:01:02  _____________________________________________________________________  External Attachment:    Type:   Image     Comment:   External Document

## 2010-04-12 ENCOUNTER — Ambulatory Visit (INDEPENDENT_AMBULATORY_CARE_PROVIDER_SITE_OTHER): Payer: Medicare Other | Admitting: Endocrinology

## 2010-04-12 ENCOUNTER — Encounter: Payer: Self-pay | Admitting: Endocrinology

## 2010-04-12 DIAGNOSIS — E109 Type 1 diabetes mellitus without complications: Secondary | ICD-10-CM

## 2010-04-12 DIAGNOSIS — M6530 Trigger finger, unspecified finger: Secondary | ICD-10-CM | POA: Insufficient documentation

## 2010-04-19 NOTE — Assessment & Plan Note (Signed)
Summary: 2-3 wk f.u  #/cd   Vital Signs:  Patient profile:   77 year old male Height:      74 inches (187.96 cm) Weight:      272.25 pounds (123.75 kg) BMI:     35.08 O2 Sat:      95 % on Room air Temp:     100.0 degrees F (37.78 degrees C) oral Pulse rate:   68 / minute Pulse rhythm:   regular BP sitting:   130 / 78  (left arm) Cuff size:   large  Vitals Entered By: Brenton Grills CMA Duncan Dull) (April 12, 2010 11:15 AM)  O2 Flow:  Room air CC: 2-3 week F/U/aj Is Patient Diabetic? Yes   Primary Provider:  Baltazar Najjar MD  CC:  2-3 week F/U/aj.  History of Present Illness: pt states he feels well in general, except for few weeks of slight "trigger finger," symptoms of several fingers of the left hand.  no assoc pain.  [facility], and obtained a copy of the cbg record.  it varies from 69-300.  it is in general lowest in the am.  on some of the entries, it is not possible to determine if the entry is "6u," or "64."    Current Medications (verified): 1)  Silvasorb  Gel (Wound Dressings) .... Apply To Affected Areas Once Daily 2)  Amitriptyline Hcl 50 Mg Tabs (Amitriptyline Hcl) .Marland Kitchen.. 1 By Mouth At Bedtime 3)  Lexapro 10 Mg Tabs (Escitalopram Oxalate) .Marland Kitchen.. 1 By Mouth Once Daily 4)  Hydroxyzine Hcl 25 Mg Tabs (Hydroxyzine Hcl) .Marland Kitchen.. 1 Every 6 Hours As Needed For Itching 5)  Imdur 60 Mg Xr24h-Tab (Isosorbide Mononitrate) .Marland Kitchen.. 1 By Mouth Once Daily 6)  Lopressor 100 Mg Tabs (Metoprolol Tartrate) .Marland Kitchen.. 1 By Mouth Two Times A Day (150mg ) 7)  Ativan 0.5 Mg Tabs (Lorazepam) .Marland Kitchen.. 1 Every 8 Hours As Needed For Anxiety 8)  Novolog 100 Unit/ml Soln (Insulin Aspart) .... Before Meals and At Bedtime Sliding Scale 9)  Zyprexa 5 Mg Tabs (Olanzapine) .Marland Kitchen.. 1 By Mouth At Bedtime 10)  Aspirin Ec 81 Mg Tbec (Aspirin) .Marland Kitchen.. 1 By Mouth Once Daily 11)  Neurontin 600 Mg Tabs (Gabapentin) .Marland Kitchen.. 1 By Mouth Two Times A Day 12)  Oxycodone Hcl 5 Mg Tabs (Oxycodone Hcl) .Marland Kitchen.. 1 Every 4  Hours As Needed For Pain 13)  Oxycontin 30 Mg Xr12h-Tab (Oxycodone Hcl) .Marland Kitchen.. 1 By Mouth Every 12 Hours As Needed For Pain 14)  Artificial Tears  Soln (Artificial Tear Solution) .Marland Kitchen.. 1 Drop in Each Each Eye Every 6 Hours While Awake 15)  Decubi-Vite  Caps (Multiple Vitamins-Minerals) .... 2 By Mouth Once Daily 16)  Ferrous Sulfate 325 (65 Fe) Mg Tabs (Ferrous Sulfate) .Marland Kitchen.. 1 By Mouth Once Daily 17)  Folic Acid 1 Mg Tabs (Folic Acid) .Marland Kitchen.. 1 By Mouth Once Daily 18)  Lidoderm 5 % Ptch (Lidocaine) .Marland Kitchen.. 1 Patch Every 12 Hours 19)  Lisinopril 40 Mg Tabs (Lisinopril) .Marland Kitchen.. 1 By Mouth Once Daily 20)  Loratadine 10 Mg Tabs (Loratadine) .Marland Kitchen.. 1 By Mouth Once Daily 21)  Milk of Magnesia 400 Mg/27ml Susp (Magnesium Hydroxide) .... As Needed 22)  Miralax  Powd (Polyethylene Glycol 3350) .Marland KitchenMarland KitchenMarland Kitchen 17 Grams Once Daily 23)  Oxybutynin Chloride 10 Mg Xr24h-Tab (Oxybutynin Chloride) .Marland Kitchen.. 1 By Mouth At Bedtime 24)  Senna Lax 8.6 Mg Tabs (Sennosides) .... 2 Tablets By Mouth At Bedtime 25)  Systane Ultra 0.4-0.3 % Soln (Polyethyl Glycol-Propyl Glycol) .Marland KitchenMarland KitchenMarland Kitchen  1 Drop in Each Eye Three Times A Day 26)  Zantac 150 Mg Tabs (Ranitidine Hcl) .Marland Kitchen.. 1 By Mouth At Bedtime 27)  Albuterol Sulfate 0.63 Mg/13ml Nebu (Albuterol Sulfate) .... As Needed Every 4 Hours 28)  Lisinopril 20 Mg Tabs (Lisinopril) .Marland Kitchen.. 1 Tablet By Mouth Once Daily 29)  Clonidine Hcl 0.1 Mg Tabs (Clonidine Hcl) .Marland Kitchen.. 1 Tablet Every 6 Hours As Needed 30)  Furosemide 40 Mg Tabs (Furosemide) .Marland Kitchen.. 1 1/2  Tablets (60mg ) By Mouth Once Daily 31)  Tramadol Hcl 50 Mg  Tabs (Tramadol Hcl) .Marland Kitchen.. 1 Tablet Every 6 Hours As Needed For Pain 32)  Ipratropium Bromide 0.02 % Soln (Ipratropium Bromide) .... As Needed Every 4 Hours For Sob 33)  Hydralazine Hcl 25 Mg Tabs (Hydralazine Hcl) .Marland Kitchen.. 1 Tablet By Mouth Every 6 Hours 34)  Lantus 100 Unit/ml Soln (Insulin Glargine) .... 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  The patient denies syncope.    Physical Exam  General:  morbidly obese.  no distress.  in wheelchair Extremities:  left hand: few flexion contractures   Impression & Recommendations:  Problem # 1:  IDDM (ICD-250.01) he needs some adjustment in his therapy  Problem # 2:  TRIGGER FINGER (ICD-727.03) Assessment: New  Other Orders: Est. Patient Level III (16109) Est. Patient Level IV (60454)  Patient Instructions: 1)  check your blood sugar 4 times a day--before the 3 meals, and at bedtime.  also check if you have symptoms of your blood sugar being too high or too low.  please keep a record of the readings and bring it to your next appointment here.  please call [place] sooner if resident is having cbg < 100. 2)  for now, change lantus to 110 units each am, and 70 units each evening. 3)  change prn novolog to: 4)  < 200: none 5)  200's: 2 units 6)  300's:  4 units 7)  over 400: 6 units 8)  Please schedule a follow-up appointment in 2-3 weeks. 9)  please send a legible cbg record with resident to each appointment.  10)  resident is advised to ask pcp if he should have any treatment for his "trigger fingers."   Orders Added: 1)  Est. Patient Level III [09811] 2)  Est. Patient Level IV [91478]

## 2010-05-14 ENCOUNTER — Encounter: Payer: Self-pay | Admitting: Endocrinology

## 2010-05-14 ENCOUNTER — Ambulatory Visit (INDEPENDENT_AMBULATORY_CARE_PROVIDER_SITE_OTHER): Payer: Medicare Other | Admitting: Endocrinology

## 2010-05-14 DIAGNOSIS — E109 Type 1 diabetes mellitus without complications: Secondary | ICD-10-CM

## 2010-05-14 LAB — TSH: TSH: 1.974 u[IU]/mL (ref 0.350–4.500)

## 2010-05-14 LAB — POCT CARDIAC MARKERS: CKMB, poc: 1.6 ng/mL (ref 1.0–8.0)

## 2010-05-14 LAB — POCT I-STAT, CHEM 8
Glucose, Bld: 51 mg/dL — ABNORMAL LOW (ref 70–99)
HCT: 37 % — ABNORMAL LOW (ref 39.0–52.0)
Hemoglobin: 12.6 g/dL — ABNORMAL LOW (ref 13.0–17.0)
Potassium: 3.3 mEq/L — ABNORMAL LOW (ref 3.5–5.1)
Sodium: 145 mEq/L (ref 135–145)
TCO2: 35 mmol/L (ref 0–100)

## 2010-05-14 LAB — WOUND CULTURE

## 2010-05-14 LAB — GLUCOSE, CAPILLARY
Glucose-Capillary: 103 mg/dL — ABNORMAL HIGH (ref 70–99)
Glucose-Capillary: 107 mg/dL — ABNORMAL HIGH (ref 70–99)
Glucose-Capillary: 109 mg/dL — ABNORMAL HIGH (ref 70–99)
Glucose-Capillary: 111 mg/dL — ABNORMAL HIGH (ref 70–99)
Glucose-Capillary: 111 mg/dL — ABNORMAL HIGH (ref 70–99)
Glucose-Capillary: 115 mg/dL — ABNORMAL HIGH (ref 70–99)
Glucose-Capillary: 118 mg/dL — ABNORMAL HIGH (ref 70–99)
Glucose-Capillary: 122 mg/dL — ABNORMAL HIGH (ref 70–99)
Glucose-Capillary: 123 mg/dL — ABNORMAL HIGH (ref 70–99)
Glucose-Capillary: 123 mg/dL — ABNORMAL HIGH (ref 70–99)
Glucose-Capillary: 127 mg/dL — ABNORMAL HIGH (ref 70–99)
Glucose-Capillary: 128 mg/dL — ABNORMAL HIGH (ref 70–99)
Glucose-Capillary: 134 mg/dL — ABNORMAL HIGH (ref 70–99)
Glucose-Capillary: 135 mg/dL — ABNORMAL HIGH (ref 70–99)
Glucose-Capillary: 138 mg/dL — ABNORMAL HIGH (ref 70–99)
Glucose-Capillary: 139 mg/dL — ABNORMAL HIGH (ref 70–99)
Glucose-Capillary: 139 mg/dL — ABNORMAL HIGH (ref 70–99)
Glucose-Capillary: 141 mg/dL — ABNORMAL HIGH (ref 70–99)
Glucose-Capillary: 141 mg/dL — ABNORMAL HIGH (ref 70–99)
Glucose-Capillary: 143 mg/dL — ABNORMAL HIGH (ref 70–99)
Glucose-Capillary: 152 mg/dL — ABNORMAL HIGH (ref 70–99)
Glucose-Capillary: 156 mg/dL — ABNORMAL HIGH (ref 70–99)
Glucose-Capillary: 159 mg/dL — ABNORMAL HIGH (ref 70–99)
Glucose-Capillary: 163 mg/dL — ABNORMAL HIGH (ref 70–99)
Glucose-Capillary: 164 mg/dL — ABNORMAL HIGH (ref 70–99)
Glucose-Capillary: 164 mg/dL — ABNORMAL HIGH (ref 70–99)
Glucose-Capillary: 169 mg/dL — ABNORMAL HIGH (ref 70–99)
Glucose-Capillary: 171 mg/dL — ABNORMAL HIGH (ref 70–99)
Glucose-Capillary: 193 mg/dL — ABNORMAL HIGH (ref 70–99)
Glucose-Capillary: 220 mg/dL — ABNORMAL HIGH (ref 70–99)
Glucose-Capillary: 247 mg/dL — ABNORMAL HIGH (ref 70–99)
Glucose-Capillary: 31 mg/dL — CL (ref 70–99)
Glucose-Capillary: 32 mg/dL — CL (ref 70–99)
Glucose-Capillary: 79 mg/dL (ref 70–99)
Glucose-Capillary: 93 mg/dL (ref 70–99)
Glucose-Capillary: 98 mg/dL (ref 70–99)

## 2010-05-14 LAB — COMPREHENSIVE METABOLIC PANEL
ALT: 12 U/L (ref 0–53)
ALT: 13 U/L (ref 0–53)
ALT: 17 U/L (ref 0–53)
AST: 21 U/L (ref 0–37)
AST: 22 U/L (ref 0–37)
Alkaline Phosphatase: 101 U/L (ref 39–117)
Alkaline Phosphatase: 75 U/L (ref 39–117)
Alkaline Phosphatase: 90 U/L (ref 39–117)
Alkaline Phosphatase: 93 U/L (ref 39–117)
BUN: 19 mg/dL (ref 6–23)
BUN: 29 mg/dL — ABNORMAL HIGH (ref 6–23)
CO2: 31 mEq/L (ref 19–32)
CO2: 31 mEq/L (ref 19–32)
CO2: 33 mEq/L — ABNORMAL HIGH (ref 19–32)
CO2: 35 mEq/L — ABNORMAL HIGH (ref 19–32)
Calcium: 9.1 mg/dL (ref 8.4–10.5)
Calcium: 9.1 mg/dL (ref 8.4–10.5)
Chloride: 102 mEq/L (ref 96–112)
Chloride: 108 mEq/L (ref 96–112)
Chloride: 108 mEq/L (ref 96–112)
Creatinine, Ser: 1.24 mg/dL (ref 0.4–1.5)
Creatinine, Ser: 4.03 mg/dL — ABNORMAL HIGH (ref 0.4–1.5)
GFR calc Af Amer: 18 mL/min — ABNORMAL LOW (ref >60)
GFR calc non Af Amer: 14 mL/min — ABNORMAL LOW (ref >60)
GFR calc non Af Amer: 43 mL/min — ABNORMAL LOW (ref >60)
GFR calc non Af Amer: 46 mL/min — ABNORMAL LOW (ref >60)
GFR calc non Af Amer: 47 mL/min — ABNORMAL LOW (ref >60)
Glucose, Bld: 137 mg/dL — ABNORMAL HIGH (ref 70–99)
Glucose, Bld: 159 mg/dL — ABNORMAL HIGH (ref 70–99)
Glucose, Bld: 42 mg/dL — CL (ref 70–99)
Glucose, Bld: 52 mg/dL — ABNORMAL LOW (ref 70–99)
Glucose, Bld: 77 mg/dL (ref 70–99)
Potassium: 3.3 mEq/L — ABNORMAL LOW (ref 3.5–5.1)
Potassium: 3.6 mEq/L (ref 3.5–5.1)
Potassium: 4.2 mEq/L (ref 3.5–5.1)
Potassium: 4.4 mEq/L (ref 3.5–5.1)
Sodium: 142 mEq/L (ref 135–145)
Sodium: 145 mEq/L (ref 135–145)
Sodium: 146 mEq/L — ABNORMAL HIGH (ref 135–145)
Total Bilirubin: 0.4 mg/dL (ref 0.3–1.2)
Total Bilirubin: 0.5 mg/dL (ref 0.3–1.2)
Total Protein: 6.5 g/dL (ref 6.0–8.3)
Total Protein: 7.5 g/dL (ref 6.0–8.3)

## 2010-05-14 LAB — CBC
HCT: 33.8 % — ABNORMAL LOW (ref 39.0–52.0)
HCT: 36.7 % — ABNORMAL LOW (ref 39.0–52.0)
HCT: 37 % — ABNORMAL LOW (ref 39.0–52.0)
HCT: 40.1 % (ref 39.0–52.0)
Hemoglobin: 11 g/dL — ABNORMAL LOW (ref 13.0–17.0)
Hemoglobin: 11.1 g/dL — ABNORMAL LOW (ref 13.0–17.0)
Hemoglobin: 12.3 g/dL — ABNORMAL LOW (ref 13.0–17.0)
Hemoglobin: 13.3 g/dL (ref 13.0–17.0)
MCH: 30.1 pg (ref 26.0–34.0)
MCH: 30.3 pg (ref 26.0–34.0)
MCH: 30.4 pg (ref 26.0–34.0)
MCH: 30.7 pg (ref 26.0–34.0)
MCH: 30.9 pg (ref 26.0–34.0)
MCHC: 32.3 g/dL (ref 30.0–36.0)
MCHC: 32.8 g/dL (ref 30.0–36.0)
MCHC: 33.2 g/dL (ref 30.0–36.0)
MCHC: 33.2 g/dL (ref 30.0–36.0)
MCV: 93.2 fL (ref 78.0–100.0)
MCV: 96.3 fL (ref 78.0–100.0)
Platelets: 110 10*3/uL — ABNORMAL LOW (ref 150–400)
Platelets: 129 10*3/uL — ABNORMAL LOW (ref 150–400)
Platelets: 135 10*3/uL — ABNORMAL LOW (ref 150–400)
Platelets: 194 10*3/uL (ref 150–400)
RBC: 3.66 MIL/uL — ABNORMAL LOW (ref 4.22–5.81)
RDW: 16.3 % — ABNORMAL HIGH (ref 11.5–15.5)
RDW: 16.4 % — ABNORMAL HIGH (ref 11.5–15.5)
RDW: 16.4 % — ABNORMAL HIGH (ref 11.5–15.5)
RDW: 16.9 % — ABNORMAL HIGH (ref 11.5–15.5)
RDW: 16.9 % — ABNORMAL HIGH (ref 11.5–15.5)
WBC: 10.4 10*3/uL (ref 4.0–10.5)
WBC: 8 10*3/uL (ref 4.0–10.5)
WBC: 8 10*3/uL (ref 4.0–10.5)

## 2010-05-14 LAB — MAGNESIUM
Magnesium: 2.2 mg/dL (ref 1.5–2.5)
Magnesium: 2.9 mg/dL — ABNORMAL HIGH (ref 1.5–2.5)

## 2010-05-14 LAB — BASIC METABOLIC PANEL
BUN: 20 mg/dL (ref 6–23)
BUN: 22 mg/dL (ref 6–23)
BUN: 68 mg/dL — ABNORMAL HIGH (ref 6–23)
BUN: 90 mg/dL — ABNORMAL HIGH (ref 6–23)
CO2: 27 mEq/L (ref 19–32)
CO2: 30 mEq/L (ref 19–32)
CO2: 35 mEq/L — ABNORMAL HIGH (ref 19–32)
Calcium: 8.8 mg/dL (ref 8.4–10.5)
Calcium: 9.2 mg/dL (ref 8.4–10.5)
Chloride: 102 mEq/L (ref 96–112)
Chloride: 106 mEq/L (ref 96–112)
Chloride: 98 mEq/L (ref 96–112)
Creatinine, Ser: 1.42 mg/dL (ref 0.4–1.5)
Creatinine, Ser: 2.01 mg/dL — ABNORMAL HIGH (ref 0.4–1.5)
GFR calc Af Amer: 58 mL/min — ABNORMAL LOW (ref >60)
GFR calc Af Amer: >60 mL/min (ref >60)
GFR calc non Af Amer: 32 mL/min — ABNORMAL LOW (ref >60)
GFR calc non Af Amer: 52 mL/min — ABNORMAL LOW (ref >60)
Glucose, Bld: 132 mg/dL — ABNORMAL HIGH (ref 70–99)
Glucose, Bld: 141 mg/dL — ABNORMAL HIGH (ref 70–99)
Glucose, Bld: 154 mg/dL — ABNORMAL HIGH (ref 70–99)
Potassium: 3.1 mEq/L — ABNORMAL LOW (ref 3.5–5.1)
Potassium: 3.3 mEq/L — ABNORMAL LOW (ref 3.5–5.1)
Potassium: 4.1 mEq/L (ref 3.5–5.1)
Potassium: 4.1 mEq/L (ref 3.5–5.1)
Potassium: 4.3 mEq/L (ref 3.5–5.1)
Potassium: 4.3 mEq/L (ref 3.5–5.1)
Sodium: 138 mEq/L (ref 135–145)
Sodium: 140 mEq/L (ref 135–145)
Sodium: 142 mEq/L (ref 135–145)
Sodium: 142 mEq/L (ref 135–145)

## 2010-05-14 LAB — POCT I-STAT 3, ART BLOOD GAS (G3+)
Acid-Base Excess: 9 mmol/L — ABNORMAL HIGH (ref 0.0–2.0)
O2 Saturation: 93 %
TCO2: 38 mmol/L (ref 0–100)
pO2, Arterial: 70 mmHg — ABNORMAL LOW (ref 80.0–100.0)

## 2010-05-14 LAB — BLOOD GAS, ARTERIAL
Acid-Base Excess: 5.2 mmol/L — ABNORMAL HIGH (ref 0.0–2.0)
Drawn by: 24513
FIO2: 0.32 %
O2 Saturation: 98.6 %
TCO2: 32.7 mmol/L (ref 0–100)
pO2, Arterial: 108 mmHg — ABNORMAL HIGH (ref 80.0–100.0)

## 2010-05-14 LAB — CK TOTAL AND CKMB (NOT AT ARMC)
CK, MB: 1.9 ng/mL (ref 0.3–4.0)
CK, MB: 2.1 ng/mL (ref 0.3–4.0)
CK, MB: 7.6 ng/mL (ref 0.3–4.0)
Relative Index: 1.6 (ref 0.0–2.5)
Relative Index: 2.7 — ABNORMAL HIGH (ref 0.0–2.5)
Total CK: 117 U/L (ref 7–232)
Total CK: 136 U/L (ref 7–232)
Total CK: 143 U/L (ref 7–232)

## 2010-05-14 LAB — PHOSPHORUS: Phosphorus: 3.6 mg/dL (ref 2.3–4.6)

## 2010-05-14 LAB — RENAL FUNCTION PANEL
CO2: 30 mEq/L (ref 19–32)
Calcium: 9.6 mg/dL (ref 8.4–10.5)
Creatinine, Ser: 1.38 mg/dL (ref 0.4–1.5)
Glucose, Bld: 178 mg/dL — ABNORMAL HIGH (ref 70–99)
Phosphorus: 2.3 mg/dL (ref 2.3–4.6)
Sodium: 140 mEq/L (ref 135–145)

## 2010-05-14 LAB — DIFFERENTIAL
Basophils Relative: 0 % (ref 0–1)
Basophils Relative: 0 % (ref 0–1)
Eosinophils Absolute: 0 10*3/uL (ref 0.0–0.7)
Eosinophils Absolute: 0.1 10*3/uL (ref 0.0–0.7)
Eosinophils Absolute: 0.4 10*3/uL (ref 0.0–0.7)
Eosinophils Relative: 0 % (ref 0–5)
Eosinophils Relative: 2 % (ref 0–5)
Lymphs Abs: 3.6 10*3/uL (ref 0.7–4.0)
Monocytes Absolute: 0.2 10*3/uL (ref 0.1–1.0)
Monocytes Relative: 1 % — ABNORMAL LOW (ref 3–12)
Monocytes Relative: 10 % (ref 3–12)
Neutro Abs: 12.5 10*3/uL — ABNORMAL HIGH (ref 1.7–7.7)
Neutrophils Relative %: 68 % (ref 43–77)
Neutrophils Relative %: 72 % (ref 43–77)
Neutrophils Relative %: 77 % (ref 43–77)
nRBC: 0 /100 WBC

## 2010-05-14 LAB — CARDIAC PANEL(CRET KIN+CKTOT+MB+TROPI)
Relative Index: 2.2 (ref 0.0–2.5)
Total CK: 212 U/L (ref 7–232)
Troponin I: 0.03 ng/mL (ref 0.00–0.06)

## 2010-05-14 LAB — HEPARIN INDUCED THROMBOCYTOPENIA PNL
Heparin Induced Plt Ab: NEGATIVE
Patient O.D.: 0.14
UFH High Dose UFH H: 0 % Release
UFH Low Dose 0.1 IU/mL: 1 % Release
UFH Low Dose 0.5 IU/mL: 1 % Release
UFH SRA Result: NEGATIVE

## 2010-05-14 LAB — MRSA PCR SCREENING
MRSA by PCR: NEGATIVE
MRSA by PCR: NEGATIVE

## 2010-05-14 LAB — AMMONIA: Ammonia: 16 umol/L (ref 11–35)

## 2010-05-15 LAB — GLUCOSE, CAPILLARY
Glucose-Capillary: 155 mg/dL — ABNORMAL HIGH (ref 70–99)
Glucose-Capillary: 192 mg/dL — ABNORMAL HIGH (ref 70–99)
Glucose-Capillary: 206 mg/dL — ABNORMAL HIGH (ref 70–99)
Glucose-Capillary: 225 mg/dL — ABNORMAL HIGH (ref 70–99)
Glucose-Capillary: 226 mg/dL — ABNORMAL HIGH (ref 70–99)
Glucose-Capillary: 249 mg/dL — ABNORMAL HIGH (ref 70–99)
Glucose-Capillary: 258 mg/dL — ABNORMAL HIGH (ref 70–99)
Glucose-Capillary: 315 mg/dL — ABNORMAL HIGH (ref 70–99)
Glucose-Capillary: 344 mg/dL — ABNORMAL HIGH (ref 70–99)
Glucose-Capillary: 378 mg/dL — ABNORMAL HIGH (ref 70–99)
Glucose-Capillary: 413 mg/dL — ABNORMAL HIGH (ref 70–99)

## 2010-05-16 LAB — CBC
HCT: 35.6 % — ABNORMAL LOW (ref 39.0–52.0)
Platelets: 167 10*3/uL (ref 150–400)
RDW: 16.8 % — ABNORMAL HIGH (ref 11.5–15.5)
WBC: 8.5 10*3/uL (ref 4.0–10.5)

## 2010-05-16 LAB — GLUCOSE, CAPILLARY
Glucose-Capillary: 287 mg/dL — ABNORMAL HIGH (ref 70–99)
Glucose-Capillary: 295 mg/dL — ABNORMAL HIGH (ref 70–99)
Glucose-Capillary: 346 mg/dL — ABNORMAL HIGH (ref 70–99)
Glucose-Capillary: 350 mg/dL — ABNORMAL HIGH (ref 70–99)
Glucose-Capillary: 386 mg/dL — ABNORMAL HIGH (ref 70–99)
Glucose-Capillary: 409 mg/dL — ABNORMAL HIGH (ref 70–99)
Glucose-Capillary: 432 mg/dL — ABNORMAL HIGH (ref 70–99)
Glucose-Capillary: 490 mg/dL — ABNORMAL HIGH (ref 70–99)
Glucose-Capillary: 216 mg/dL — ABNORMAL HIGH (ref 70–99)
Glucose-Capillary: 242 mg/dL — ABNORMAL HIGH (ref 70–99)
Glucose-Capillary: 261 mg/dL — ABNORMAL HIGH (ref 70–99)
Glucose-Capillary: 388 mg/dL — ABNORMAL HIGH (ref 70–99)

## 2010-05-16 LAB — BASIC METABOLIC PANEL
BUN: 18 mg/dL (ref 6–23)
GFR calc non Af Amer: >60 mL/min (ref >60)
Potassium: 3.9 mEq/L (ref 3.5–5.1)

## 2010-05-16 LAB — DIFFERENTIAL
Basophils Absolute: 0 10*3/uL (ref 0.0–0.1)
Lymphocytes Relative: 11 % — ABNORMAL LOW (ref 12–46)
Neutro Abs: 6.8 10*3/uL (ref 1.7–7.7)

## 2010-05-16 LAB — TROPONIN I: Troponin I: 0.05 ng/mL (ref 0.00–0.06)

## 2010-05-16 LAB — CK TOTAL AND CKMB (NOT AT ARMC)
CK, MB: 2.2 ng/mL (ref 0.3–4.0)
Relative Index: INVALID (ref 0.0–2.5)
Total CK: 62 U/L (ref 7–232)

## 2010-05-17 LAB — GLUCOSE, CAPILLARY: Glucose-Capillary: 477 mg/dL — ABNORMAL HIGH (ref 70–99)

## 2010-05-20 LAB — BASIC METABOLIC PANEL
BUN: 10 mg/dL (ref 6–23)
BUN: 25 mg/dL — ABNORMAL HIGH (ref 6–23)
BUN: 75 mg/dL — ABNORMAL HIGH (ref 6–23)
CO2: 23 mEq/L (ref 19–32)
CO2: 25 mEq/L (ref 19–32)
CO2: 26 mEq/L (ref 19–32)
CO2: 26 mEq/L (ref 19–32)
CO2: 28 mEq/L (ref 19–32)
CO2: 20 mEq/L (ref 19–32)
Calcium: 8.1 mg/dL — ABNORMAL LOW (ref 8.4–10.5)
Calcium: 8.4 mg/dL (ref 8.4–10.5)
Calcium: 8.4 mg/dL (ref 8.4–10.5)
Calcium: 8.4 mg/dL (ref 8.4–10.5)
Calcium: 8.6 mg/dL (ref 8.4–10.5)
Chloride: 105 mEq/L (ref 96–112)
Chloride: 107 mEq/L (ref 96–112)
Chloride: 109 mEq/L (ref 96–112)
Chloride: 101 mEq/L (ref 96–112)
Creatinine, Ser: 1.05 mg/dL (ref 0.4–1.5)
Creatinine, Ser: 6.54 mg/dL — ABNORMAL HIGH (ref 0.4–1.5)
Creatinine, Ser: 8.78 mg/dL — ABNORMAL HIGH (ref 0.4–1.5)
Creatinine, Ser: 9.02 mg/dL — ABNORMAL HIGH (ref 0.4–1.5)
GFR calc Af Amer: 18 mL/min — ABNORMAL LOW (ref >60)
GFR calc Af Amer: 7 mL/min — ABNORMAL LOW (ref >60)
GFR calc Af Amer: >60 mL/min (ref >60)
GFR calc Af Amer: 7 mL/min — ABNORMAL LOW (ref >60)
GFR calc non Af Amer: >60 mL/min (ref >60)
Glucose, Bld: 162 mg/dL — ABNORMAL HIGH (ref 70–99)
Glucose, Bld: 197 mg/dL — ABNORMAL HIGH (ref 70–99)
Glucose, Bld: 104 mg/dL — ABNORMAL HIGH (ref 70–99)
Potassium: 3.3 mEq/L — ABNORMAL LOW (ref 3.5–5.1)
Potassium: 3.6 mEq/L (ref 3.5–5.1)
Potassium: 4 mEq/L (ref 3.5–5.1)
Potassium: 5.9 mEq/L — ABNORMAL HIGH (ref 3.5–5.1)
Sodium: 137 mEq/L (ref 135–145)
Sodium: 138 mEq/L (ref 135–145)
Sodium: 141 mEq/L (ref 135–145)
Sodium: 145 mEq/L (ref 135–145)

## 2010-05-20 LAB — URINALYSIS, ROUTINE W REFLEX MICROSCOPIC
Ketones, ur: NEGATIVE mg/dL
Leukocytes, UA: NEGATIVE
Leukocytes, UA: NEGATIVE
Nitrite: NEGATIVE
Nitrite: NEGATIVE
Protein, ur: 100 mg/dL — AB
Protein, ur: >300 mg/dL — AB
Specific Gravity, Urine: 1.027 (ref 1.005–1.030)
Urobilinogen, UA: 0.2 mg/dL (ref 0.0–1.0)
Urobilinogen, UA: 1 mg/dL (ref 0.0–1.0)
pH: 5.5 (ref 5.0–8.0)

## 2010-05-20 LAB — RENAL FUNCTION PANEL
Albumin: 3.1 g/dL — ABNORMAL LOW (ref 3.5–5.2)
Albumin: 3.4 g/dL — ABNORMAL LOW (ref 3.5–5.2)
BUN: 37 mg/dL — ABNORMAL HIGH (ref 6–23)
BUN: 59 mg/dL — ABNORMAL HIGH (ref 6–23)
BUN: 78 mg/dL — ABNORMAL HIGH (ref 6–23)
CO2: 22 mEq/L (ref 19–32)
CO2: 24 mEq/L (ref 19–32)
CO2: 24 mEq/L (ref 19–32)
Calcium: 8.2 mg/dL — ABNORMAL LOW (ref 8.4–10.5)
Calcium: 8.6 mg/dL (ref 8.4–10.5)
Calcium: 7.7 mg/dL — ABNORMAL LOW (ref 8.4–10.5)
Calcium: 7.8 mg/dL — ABNORMAL LOW (ref 8.4–10.5)
Chloride: 96 mEq/L (ref 96–112)
Creatinine, Ser: 10.23 mg/dL — ABNORMAL HIGH (ref 0.4–1.5)
Creatinine, Ser: 12.1 mg/dL — ABNORMAL HIGH (ref 0.4–1.5)
Creatinine, Ser: 11.23 mg/dL — ABNORMAL HIGH (ref 0.4–1.5)
Creatinine, Ser: 15.76 mg/dL — ABNORMAL HIGH (ref 0.4–1.5)
GFR calc Af Amer: 5 mL/min — ABNORMAL LOW (ref >60)
GFR calc Af Amer: 3 mL/min — ABNORMAL LOW (ref >60)
GFR calc Af Amer: 4 mL/min — ABNORMAL LOW (ref >60)
GFR calc non Af Amer: 4 mL/min — ABNORMAL LOW (ref >60)
GFR calc non Af Amer: 3 mL/min — ABNORMAL LOW (ref >60)
GFR calc non Af Amer: 3 mL/min — ABNORMAL LOW (ref >60)
Glucose, Bld: 207 mg/dL — ABNORMAL HIGH (ref 70–99)
Glucose, Bld: 121 mg/dL — ABNORMAL HIGH (ref 70–99)
Phosphorus: 5.4 mg/dL — ABNORMAL HIGH (ref 2.3–4.6)
Phosphorus: 6.8 mg/dL — ABNORMAL HIGH (ref 2.3–4.6)
Phosphorus: 8 mg/dL — ABNORMAL HIGH (ref 2.3–4.6)
Phosphorus: 8.9 mg/dL — ABNORMAL HIGH (ref 2.3–4.6)
Potassium: 4.5 mEq/L (ref 3.5–5.1)
Sodium: 136 mEq/L (ref 135–145)
Sodium: 140 mEq/L (ref 135–145)

## 2010-05-20 LAB — GLUCOSE, CAPILLARY
Glucose-Capillary: 105 mg/dL — ABNORMAL HIGH (ref 70–99)
Glucose-Capillary: 117 mg/dL — ABNORMAL HIGH (ref 70–99)
Glucose-Capillary: 126 mg/dL — ABNORMAL HIGH (ref 70–99)
Glucose-Capillary: 146 mg/dL — ABNORMAL HIGH (ref 70–99)
Glucose-Capillary: 153 mg/dL — ABNORMAL HIGH (ref 70–99)
Glucose-Capillary: 157 mg/dL — ABNORMAL HIGH (ref 70–99)
Glucose-Capillary: 160 mg/dL — ABNORMAL HIGH (ref 70–99)
Glucose-Capillary: 176 mg/dL — ABNORMAL HIGH (ref 70–99)
Glucose-Capillary: 183 mg/dL — ABNORMAL HIGH (ref 70–99)
Glucose-Capillary: 196 mg/dL — ABNORMAL HIGH (ref 70–99)
Glucose-Capillary: 204 mg/dL — ABNORMAL HIGH (ref 70–99)
Glucose-Capillary: 212 mg/dL — ABNORMAL HIGH (ref 70–99)
Glucose-Capillary: 222 mg/dL — ABNORMAL HIGH (ref 70–99)
Glucose-Capillary: 233 mg/dL — ABNORMAL HIGH (ref 70–99)
Glucose-Capillary: 234 mg/dL — ABNORMAL HIGH (ref 70–99)
Glucose-Capillary: 262 mg/dL — ABNORMAL HIGH (ref 70–99)
Glucose-Capillary: 272 mg/dL — ABNORMAL HIGH (ref 70–99)
Glucose-Capillary: 275 mg/dL — ABNORMAL HIGH (ref 70–99)
Glucose-Capillary: 366 mg/dL — ABNORMAL HIGH (ref 70–99)
Glucose-Capillary: 101 mg/dL — ABNORMAL HIGH (ref 70–99)
Glucose-Capillary: 102 mg/dL — ABNORMAL HIGH (ref 70–99)
Glucose-Capillary: 106 mg/dL — ABNORMAL HIGH (ref 70–99)
Glucose-Capillary: 111 mg/dL — ABNORMAL HIGH (ref 70–99)
Glucose-Capillary: 129 mg/dL — ABNORMAL HIGH (ref 70–99)
Glucose-Capillary: 148 mg/dL — ABNORMAL HIGH (ref 70–99)
Glucose-Capillary: 157 mg/dL — ABNORMAL HIGH (ref 70–99)
Glucose-Capillary: 160 mg/dL — ABNORMAL HIGH (ref 70–99)
Glucose-Capillary: 166 mg/dL — ABNORMAL HIGH (ref 70–99)
Glucose-Capillary: 178 mg/dL — ABNORMAL HIGH (ref 70–99)
Glucose-Capillary: 67 mg/dL — ABNORMAL LOW (ref 70–99)
Glucose-Capillary: 73 mg/dL (ref 70–99)
Glucose-Capillary: 79 mg/dL (ref 70–99)
Glucose-Capillary: 98 mg/dL (ref 70–99)
Glucose-Capillary: 98 mg/dL (ref 70–99)

## 2010-05-20 LAB — CBC
HCT: 25 % — ABNORMAL LOW (ref 39.0–52.0)
HCT: 25.3 % — ABNORMAL LOW (ref 39.0–52.0)
HCT: 25.8 % — ABNORMAL LOW (ref 39.0–52.0)
HCT: 26.1 % — ABNORMAL LOW (ref 39.0–52.0)
HCT: 26.1 % — ABNORMAL LOW (ref 39.0–52.0)
HCT: 27.4 % — ABNORMAL LOW (ref 39.0–52.0)
HCT: 30.8 % — ABNORMAL LOW (ref 39.0–52.0)
Hemoglobin: 8.9 g/dL — ABNORMAL LOW (ref 13.0–17.0)
Hemoglobin: 9 g/dL — ABNORMAL LOW (ref 13.0–17.0)
Hemoglobin: 9.3 g/dL — ABNORMAL LOW (ref 13.0–17.0)
Hemoglobin: 9.4 g/dL — ABNORMAL LOW (ref 13.0–17.0)
Hemoglobin: 9.4 g/dL — ABNORMAL LOW (ref 13.0–17.0)
Hemoglobin: 9.9 g/dL — ABNORMAL LOW (ref 13.0–17.0)
MCHC: 35.3 g/dL (ref 30.0–36.0)
MCHC: 35.3 g/dL (ref 30.0–36.0)
MCHC: 35.5 g/dL (ref 30.0–36.0)
MCHC: 35.5 g/dL (ref 30.0–36.0)
MCHC: 35.8 g/dL (ref 30.0–36.0)
MCHC: 36 g/dL (ref 30.0–36.0)
MCV: 91.2 fL (ref 78.0–100.0)
MCV: 91.3 fL (ref 78.0–100.0)
MCV: 91.5 fL (ref 78.0–100.0)
MCV: 92.2 fL (ref 78.0–100.0)
MCV: 92.3 fL (ref 78.0–100.0)
Platelets: 211 10*3/uL (ref 150–400)
Platelets: 74 10*3/uL — ABNORMAL LOW (ref 150–400)
Platelets: 81 10*3/uL — ABNORMAL LOW (ref 150–400)
Platelets: DECREASED 10*3/uL (ref 150–400)
RBC: 2.74 MIL/uL — ABNORMAL LOW (ref 4.22–5.81)
RBC: 2.74 MIL/uL — ABNORMAL LOW (ref 4.22–5.81)
RBC: 2.83 MIL/uL — ABNORMAL LOW (ref 4.22–5.81)
RBC: 3.01 MIL/uL — ABNORMAL LOW (ref 4.22–5.81)
RBC: 3.38 MIL/uL — ABNORMAL LOW (ref 4.22–5.81)
RDW: 16.1 % — ABNORMAL HIGH (ref 11.5–15.5)
RDW: 16.2 % — ABNORMAL HIGH (ref 11.5–15.5)
RDW: 16.5 % — ABNORMAL HIGH (ref 11.5–15.5)
RDW: 16.6 % — ABNORMAL HIGH (ref 11.5–15.5)
RDW: 16.8 % — ABNORMAL HIGH (ref 11.5–15.5)
WBC: 4.2 10*3/uL (ref 4.0–10.5)
WBC: 5.3 10*3/uL (ref 4.0–10.5)
WBC: 6.5 10*3/uL (ref 4.0–10.5)

## 2010-05-20 LAB — CK TOTAL AND CKMB (NOT AT ARMC)
CK, MB: 2.1 ng/mL (ref 0.3–4.0)
Relative Index: 1.1 (ref 0.0–2.5)
Total CK: 187 U/L (ref 7–232)

## 2010-05-20 LAB — COMPREHENSIVE METABOLIC PANEL
Albumin: 2.7 g/dL — ABNORMAL LOW (ref 3.5–5.2)
BUN: 23 mg/dL (ref 6–23)
BUN: 38 mg/dL — ABNORMAL HIGH (ref 6–23)
CO2: 26 mEq/L (ref 19–32)
Chloride: 97 mEq/L (ref 96–112)
Creatinine, Ser: 1.28 mg/dL (ref 0.4–1.5)
Creatinine, Ser: 1.83 mg/dL — ABNORMAL HIGH (ref 0.4–1.5)
GFR calc non Af Amer: 36 mL/min — ABNORMAL LOW (ref >60)
Glucose, Bld: 100 mg/dL — ABNORMAL HIGH (ref 70–99)
Total Bilirubin: 0.6 mg/dL (ref 0.3–1.2)
Total Bilirubin: 0.8 mg/dL (ref 0.3–1.2)
Total Protein: 5.5 g/dL — ABNORMAL LOW (ref 6.0–8.3)

## 2010-05-20 LAB — CARDIAC PANEL(CRET KIN+CKTOT+MB+TROPI)
Relative Index: 0.9 (ref 0.0–2.5)
Total CK: 227 U/L (ref 7–232)
Troponin I: 0.05 ng/mL (ref 0.00–0.06)
Troponin I: 0.05 ng/mL (ref 0.00–0.06)

## 2010-05-20 LAB — TSH: TSH: 0.954 u[IU]/mL (ref 0.350–4.500)

## 2010-05-20 LAB — URINE CULTURE: Culture: NO GROWTH

## 2010-05-20 LAB — DIFFERENTIAL
Basophils Absolute: 0 10*3/uL (ref 0.0–0.1)
Lymphocytes Relative: 4 % — ABNORMAL LOW (ref 12–46)
Neutro Abs: 4.7 10*3/uL (ref 1.7–7.7)
Neutrophils Relative %: 89 % — ABNORMAL HIGH (ref 43–77)

## 2010-05-20 LAB — URINE MICROSCOPIC-ADD ON

## 2010-05-20 LAB — POCT CARDIAC MARKERS: Troponin i, poc: <0.05 ng/mL (ref 0.00–0.09)

## 2010-05-20 LAB — POCT I-STAT 3, ART BLOOD GAS (G3+)
Acid-Base Excess: 2 mmol/L (ref 0.0–2.0)
Bicarbonate: 25.5 mEq/L — ABNORMAL HIGH (ref 20.0–24.0)
O2 Saturation: 92 %
pO2, Arterial: 59 mmHg — ABNORMAL LOW (ref 80.0–100.0)

## 2010-05-20 LAB — WOUND CULTURE

## 2010-05-20 LAB — TROPONIN I: Troponin I: 0.02 ng/mL (ref 0.00–0.06)

## 2010-05-20 LAB — VANCOMYCIN, TROUGH: Vancomycin Tr: 59 ug/mL (ref 10.0–20.0)

## 2010-05-20 LAB — CULTURE, BLOOD (ROUTINE X 2): Culture: NO GROWTH

## 2010-05-22 NOTE — Assessment & Plan Note (Signed)
Summary: 1 mos f/u   Vital Signs:  Patient profile:   77 year old male Height:      74 inches (187.96 cm) Weight:      267.38 pounds (121.54 kg) BMI:     34.45 O2 Sat:      95 % on Room air Temp:     98.9 degrees F (37.17 degrees C) oral Pulse rate:   77 / minute Pulse rhythm:   regular BP sitting:   140 / 76  (left arm) Cuff size:   large  Vitals Entered By: Brenton Grills CMA Duncan Dull) (May 14, 2010 11:01 AM)  O2 Flow:  Room air CC: 1 month F/U/episodes of syncope per pt Low CBG/aj Is Patient Diabetic? Yes Comments Pt is no longer taking Lisinopril 20mg , Clonidine or Tramadol   Primary Provider:  Baltazar Najjar MD  CC:  1 month F/U/episodes of syncope per pt Low CBG/aj.  History of Present Illness: pt states he feels well in general, except during hypoglycemia.  he brings a record of his cbg's which i have reviewed today.  it is often 50-70 in the early hrs of the am.   he reports weight gain.    Current Medications (verified): 1)  Woun'dres Hydrogel Wound Dress  Gel (Wound Dressings) .... Apply To Affected Areas Once Daily 2)  Amitriptyline Hcl 50 Mg Tabs (Amitriptyline Hcl) .Marland Kitchen.. 1 By Mouth At Bedtime 3)  Lexapro 20 Mg Tabs (Escitalopram Oxalate) .Marland Kitchen.. 1 Tablet By Mouth Once Daily 4)  Hydroxyzine Hcl 25 Mg Tabs (Hydroxyzine Hcl) .Marland Kitchen.. 1 Every 6 Hours As Needed For Itching 5)  Imdur 60 Mg Xr24h-Tab (Isosorbide Mononitrate) .Marland Kitchen.. 1 By Mouth Once Daily 6)  Lopressor 100 Mg Tabs (Metoprolol Tartrate) .Marland Kitchen.. 1 By Mouth Two Times A Day (150mg ) 7)  Ativan 0.5 Mg Tabs (Lorazepam) .Marland Kitchen.. 1 Every 8 Hours As Needed For Anxiety 8)  Novolog 100 Unit/ml Soln (Insulin Aspart) .... Before Meals and At Bedtime Sliding Scale 9)  Zyprexa 5 Mg Tabs (Olanzapine) .Marland Kitchen.. 1 By Mouth At Bedtime 10)  Aspirin Ec 81 Mg Tbec (Aspirin) .Marland Kitchen.. 1 By Mouth Once Daily 11)  Neurontin 600 Mg Tabs (Gabapentin) .Marland Kitchen.. 1 By Mouth Two Times A Day 12)  Oxycodone Hcl 5 Mg Tabs (Oxycodone Hcl) .Marland Kitchen.. 1 Every 4 Hours As Needed  For Pain 13)  Oxycontin 30 Mg Xr12h-Tab (Oxycodone Hcl) .Marland Kitchen.. 1 By Mouth Every 12 Hours As Needed For Pain 14)  Artificial Tears  Soln (Artificial Tear Solution) .Marland Kitchen.. 1 Drop in Each Each Eye Every 6 Hours While Awake 15)  Decubi-Vite  Caps (Multiple Vitamins-Minerals) .... 2 By Mouth Once Daily 16)  Ferrous Sulfate 325 (65 Fe) Mg Tabs (Ferrous Sulfate) .Marland Kitchen.. 1 By Mouth Once Daily 17)  Folic Acid 1 Mg Tabs (Folic Acid) .Marland Kitchen.. 1 By Mouth Once Daily 18)  Lidoderm 5 % Ptch (Lidocaine) .Marland Kitchen.. 1 Patch Every 12 Hours 19)  Lisinopril 40 Mg Tabs (Lisinopril) .Marland Kitchen.. 1 By Mouth Once Daily 20)  Loratadine 10 Mg Tabs (Loratadine) .Marland Kitchen.. 1 By Mouth Once Daily 21)  Milk of Magnesia 400 Mg/57ml Susp (Magnesium Hydroxide) .... As Needed 22)  Miralax  Powd (Polyethylene Glycol 3350) .Marland KitchenMarland KitchenMarland Kitchen 17 Grams Once Daily 23)  Oxybutynin Chloride 10 Mg Xr24h-Tab (Oxybutynin Chloride) .Marland Kitchen.. 1 By Mouth At Bedtime 24)  Senna Lax 8.6 Mg Tabs (Sennosides) .... 2 Tablets By Mouth At Bedtime 25)  Systane Ultra 0.4-0.3 % Soln (Polyethyl Glycol-Propyl Glycol) .Marland Kitchen.. 1 Drop in Each Eye Three Times A Day 26)  Zantac 150 Mg Tabs (Ranitidine Hcl) .Marland Kitchen.. 1 By Mouth At Bedtime 27)  Albuterol Sulfate 0.63 Mg/86ml Nebu (Albuterol Sulfate) .... As Needed Every 4 Hours 28)  Lisinopril 20 Mg Tabs (Lisinopril) .Marland Kitchen.. 1 Tablet By Mouth Once Daily 29)  Clonidine Hcl 0.1 Mg Tabs (Clonidine Hcl) .Marland Kitchen.. 1 Tablet Every 6 Hours As Needed 30)  Furosemide 40 Mg Tabs (Furosemide) .Marland Kitchen.. 1 1/2  Tablets (60mg ) By Mouth Twice Daily 31)  Tramadol Hcl 50 Mg  Tabs (Tramadol Hcl) .Marland Kitchen.. 1 Tablet Every 6 Hours As Needed For Pain 32)  Ipratropium Bromide 0.02 % Soln (Ipratropium Bromide) .... As Needed Every 4 Hours For Sob 33)  Hydralazine Hcl 25 Mg Tabs (Hydralazine Hcl) .Marland Kitchen.. 1 Tablet By Mouth Every 6 Hours 34)  Lantus 100 Unit/ml Soln (Insulin Glargine) .... 90 Units Two Times A Day 35)  Santyl 250 Unit/gm Oint (Collagenase) .... Apply Once Daily 36)  Cymbalta 30 Mg Cpep (Duloxetine  Hcl) .Marland Kitchen.. 1 Capsule By Mouth At Bedtime  Allergies (verified): No Known Drug Allergies  Past History:  Past Medical History: Last updated: 01/15/2010 ANEMIA (ICD-285.9) DIABETIC FOOT ULCER (ICD-707.14) UNSPECIFIED PERIPHERAL VASCULAR DISEASE (ICD-443.9) BACK PAIN, LUMBAR (ICD-724.2) POLYNEUROPATHY (ICD-357.9) HYPONATREMIA (ICD-276.1) DEPRESSION (ICD-311) HYPERTENSION (ICD-401.9) PORTAL HYPERTENSION (ICD-572.3) ALCOHOL ABUSE, IN REMISSION (ICD-305.03) HYPOKALEMIA (ICD-276.8) DYSLIPIDEMIA (ICD-272.4) GERD (ICD-530.81) IDDM (ICD-250.01)  Review of Systems  The patient denies syncope.    Physical Exam  General:  morbidly obese.  no distress.  in wheelchair. Skin:  injection sites at the anterior abdomen are without lesions.     Impression & Recommendations:  Problem # 1:  IDDM (ICD-250.01) overcontrolled.    Medications Added to Medication List This Visit: 1)  Woun'dres Hydrogel Wound Dress Gel (Wound dressings) .... Apply to affected areas once daily 2)  Lexapro 20 Mg Tabs (Escitalopram oxalate) .Marland Kitchen.. 1 tablet by mouth once daily 3)  Furosemide 40 Mg Tabs (Furosemide) .Marland Kitchen.. 1 1/2  tablets (60mg ) by mouth twice daily 4)  Santyl 250 Unit/gm Oint (Collagenase) .... Apply once daily 5)  Cymbalta 30 Mg Cpep (Duloxetine hcl) .Marland Kitchen.. 1 capsule by mouth at bedtime  Other Orders: Est. Patient Level III (91478)  Patient Instructions: 1)  check your blood sugar 4 times a day--before the 3 meals, and at bedtime.  also check if you have symptoms of your blood sugar being too high or too low.  please keep a record of the readings and bring it to your next appointment here.  please call [place] sooner if resident is having cbg < 100. 2)  reduce lantus to 110 units each am, and 60 units each evening. 3)  continue prn novolog: 4)  < 200: none 5)  200's: 2 units 6)  300's:  4 units 7)  over 400: 6 units 8)  Please schedule a follow-up appointment in 6 weeks. 9)  please send a legible cbg  record with resident to each appointment.    Orders Added: 1)  Est. Patient Level III [29562]

## 2010-05-22 NOTE — Miscellaneous (Signed)
Summary: BG log sheet/[facility]  BG log sheet/[facility]   Imported By: Lester [place] 05/16/2010 08:56:48  _____________________________________________________________________  External Attachment:    Type:   Image     Comment:   External Document

## 2010-05-23 LAB — GLUCOSE, CAPILLARY: Glucose-Capillary: 420 mg/dL — ABNORMAL HIGH (ref 70–99)

## 2010-06-06 LAB — GLUCOSE, CAPILLARY
Glucose-Capillary: 385 mg/dL — ABNORMAL HIGH (ref 70–99)
Glucose-Capillary: 327 mg/dL — ABNORMAL HIGH (ref 70–99)

## 2010-06-07 LAB — BASIC METABOLIC PANEL
BUN: 17 mg/dL (ref 6–23)
BUN: 24 mg/dL — ABNORMAL HIGH (ref 6–23)
CO2: 31 mEq/L (ref 19–32)
CO2: 31 mEq/L (ref 19–32)
Calcium: 8.8 mg/dL (ref 8.4–10.5)
Chloride: 101 mEq/L (ref 96–112)
Creatinine, Ser: 1.43 mg/dL (ref 0.4–1.5)
Creatinine, Ser: 1.55 mg/dL — ABNORMAL HIGH (ref 0.4–1.5)
Creatinine, Ser: 1.55 mg/dL — ABNORMAL HIGH (ref 0.4–1.5)
GFR calc Af Amer: 53 mL/min — ABNORMAL LOW (ref >60)
GFR calc non Af Amer: 44 mL/min — ABNORMAL LOW (ref >60)
GFR calc non Af Amer: 48 mL/min — ABNORMAL LOW (ref >60)
Glucose, Bld: 168 mg/dL — ABNORMAL HIGH (ref 70–99)
Potassium: 3.7 mEq/L (ref 3.5–5.1)
Sodium: 138 mEq/L (ref 135–145)

## 2010-06-07 LAB — CBC
HCT: 33 % — ABNORMAL LOW (ref 39.0–52.0)
HCT: 36.3 % — ABNORMAL LOW (ref 39.0–52.0)
Hemoglobin: 11 g/dL — ABNORMAL LOW (ref 13.0–17.0)
Hemoglobin: 11.1 g/dL — ABNORMAL LOW (ref 13.0–17.0)
Hemoglobin: 12.6 g/dL — ABNORMAL LOW (ref 13.0–17.0)
MCHC: 34.6 g/dL (ref 30.0–36.0)
MCHC: 35.1 g/dL (ref 30.0–36.0)
MCHC: 35.3 g/dL (ref 30.0–36.0)
MCHC: 35.7 g/dL (ref 30.0–36.0)
MCV: 95.1 fL (ref 78.0–100.0)
MCV: 95.2 fL (ref 78.0–100.0)
MCV: 95.3 fL (ref 78.0–100.0)
Platelets: 117 10*3/uL — ABNORMAL LOW (ref 150–400)
Platelets: 130 10*3/uL — ABNORMAL LOW (ref 150–400)
RBC: 3.26 MIL/uL — ABNORMAL LOW (ref 4.22–5.81)
RBC: 3.31 MIL/uL — ABNORMAL LOW (ref 4.22–5.81)
RBC: 3.81 MIL/uL — ABNORMAL LOW (ref 4.22–5.81)
RDW: 15.1 % (ref 11.5–15.5)
RDW: 15.1 % (ref 11.5–15.5)
RDW: 15.2 % (ref 11.5–15.5)
RDW: 15.8 % — ABNORMAL HIGH (ref 11.5–15.5)
WBC: 17.7 10*3/uL — ABNORMAL HIGH (ref 4.0–10.5)

## 2010-06-07 LAB — POCT I-STAT 3, ART BLOOD GAS (G3+)
Acid-Base Excess: 5 mmol/L — ABNORMAL HIGH (ref 0.0–2.0)
Bicarbonate: 31 mEq/L — ABNORMAL HIGH (ref 20.0–24.0)
O2 Saturation: 93 %
Patient temperature: 101
TCO2: 32 mmol/L (ref 0–100)
pH, Arterial: 7.392 (ref 7.350–7.450)
pO2, Arterial: 73 mmHg — ABNORMAL LOW (ref 80.0–100.0)

## 2010-06-07 LAB — COMPREHENSIVE METABOLIC PANEL
ALT: 18 U/L (ref 0–53)
AST: 27 U/L (ref 0–37)
Alkaline Phosphatase: 67 U/L (ref 39–117)
CO2: 29 mEq/L (ref 19–32)
Calcium: 9.3 mg/dL (ref 8.4–10.5)
GFR calc Af Amer: 58 mL/min — ABNORMAL LOW (ref >60)
Glucose, Bld: 133 mg/dL — ABNORMAL HIGH (ref 70–99)
Potassium: 3.8 mEq/L (ref 3.5–5.1)
Sodium: 143 mEq/L (ref 135–145)
Total Protein: 6.9 g/dL (ref 6.0–8.3)

## 2010-06-07 LAB — DIFFERENTIAL
Basophils Absolute: 0 10*3/uL (ref 0.0–0.1)
Basophils Relative: 0 % (ref 0–1)
Basophils Relative: 0 % (ref 0–1)
Basophils Relative: 0 % (ref 0–1)
Eosinophils Absolute: 0.1 10*3/uL (ref 0.0–0.7)
Eosinophils Absolute: 0.3 10*3/uL (ref 0.0–0.7)
Eosinophils Relative: 1 % (ref 0–5)
Eosinophils Relative: 4 % (ref 0–5)
Lymphocytes Relative: 10 % — ABNORMAL LOW (ref 12–46)
Lymphs Abs: 0.8 10*3/uL (ref 0.7–4.0)
Monocytes Absolute: 0.7 10*3/uL (ref 0.1–1.0)
Monocytes Absolute: 1.1 10*3/uL — ABNORMAL HIGH (ref 0.1–1.0)
Monocytes Relative: 10 % (ref 3–12)
Monocytes Relative: 6 % (ref 3–12)
Monocytes Relative: 7 % (ref 3–12)
Neutro Abs: 8.5 10*3/uL — ABNORMAL HIGH (ref 1.7–7.7)
Neutrophils Relative %: 80 % — ABNORMAL HIGH (ref 43–77)

## 2010-06-07 LAB — URINALYSIS, ROUTINE W REFLEX MICROSCOPIC
Ketones, ur: NEGATIVE mg/dL
Protein, ur: 100 mg/dL — AB
Urobilinogen, UA: 1 mg/dL (ref 0.0–1.0)
pH: 8 (ref 5.0–8.0)

## 2010-06-07 LAB — CULTURE, BLOOD (ROUTINE X 2): Culture: NO GROWTH

## 2010-06-07 LAB — URINE CULTURE: Colony Count: 60000

## 2010-06-07 LAB — GLUCOSE, CAPILLARY
Glucose-Capillary: 141 mg/dL — ABNORMAL HIGH (ref 70–99)
Glucose-Capillary: 158 mg/dL — ABNORMAL HIGH (ref 70–99)
Glucose-Capillary: 203 mg/dL — ABNORMAL HIGH (ref 70–99)
Glucose-Capillary: 217 mg/dL — ABNORMAL HIGH (ref 70–99)
Glucose-Capillary: 219 mg/dL — ABNORMAL HIGH (ref 70–99)
Glucose-Capillary: 240 mg/dL — ABNORMAL HIGH (ref 70–99)
Glucose-Capillary: 264 mg/dL — ABNORMAL HIGH (ref 70–99)
Glucose-Capillary: 272 mg/dL — ABNORMAL HIGH (ref 70–99)
Glucose-Capillary: 276 mg/dL — ABNORMAL HIGH (ref 70–99)
Glucose-Capillary: 393 mg/dL — ABNORMAL HIGH (ref 70–99)
Glucose-Capillary: 64 mg/dL — ABNORMAL LOW (ref 70–99)
Glucose-Capillary: 91 mg/dL (ref 70–99)
Glucose-Capillary: 91 mg/dL (ref 70–99)
Glucose-Capillary: 98 mg/dL (ref 70–99)

## 2010-06-07 LAB — CK TOTAL AND CKMB (NOT AT ARMC)
CK, MB: 0.7 ng/mL (ref 0.3–4.0)
Relative Index: INVALID (ref 0.0–2.5)

## 2010-06-07 LAB — CARDIAC PANEL(CRET KIN+CKTOT+MB+TROPI)
Relative Index: INVALID (ref 0.0–2.5)
Total CK: 93 U/L (ref 7–232)
Total CK: 95 U/L (ref 7–232)
Troponin I: 0.02 ng/mL (ref 0.00–0.06)

## 2010-06-07 LAB — URINE MICROSCOPIC-ADD ON

## 2010-06-07 LAB — BRAIN NATRIURETIC PEPTIDE: Pro B Natriuretic peptide (BNP): 333 pg/mL — ABNORMAL HIGH (ref 0.0–100.0)

## 2010-06-07 LAB — PROTIME-INR
INR: 1.16 (ref 0.00–1.49)
Prothrombin Time: 14.7 seconds (ref 11.6–15.2)

## 2010-06-07 LAB — APTT: aPTT: 34 seconds (ref 24–37)

## 2010-06-08 LAB — GLUCOSE, CAPILLARY: Glucose-Capillary: 216 mg/dL — ABNORMAL HIGH (ref 70–99)

## 2010-06-10 LAB — GLUCOSE, CAPILLARY: Glucose-Capillary: 223 mg/dL — ABNORMAL HIGH (ref 70–99)

## 2010-06-25 ENCOUNTER — Other Ambulatory Visit (INDEPENDENT_AMBULATORY_CARE_PROVIDER_SITE_OTHER): Payer: Medicare Other

## 2010-06-25 ENCOUNTER — Encounter: Payer: Self-pay | Admitting: Endocrinology

## 2010-06-25 ENCOUNTER — Ambulatory Visit (INDEPENDENT_AMBULATORY_CARE_PROVIDER_SITE_OTHER): Payer: Medicare Other | Admitting: Endocrinology

## 2010-06-25 VITALS — BP 152/92 | HR 68 | Temp 98.0°F | Wt 270.0 lb

## 2010-06-25 DIAGNOSIS — E109 Type 1 diabetes mellitus without complications: Secondary | ICD-10-CM

## 2010-06-25 DIAGNOSIS — E119 Type 2 diabetes mellitus without complications: Secondary | ICD-10-CM

## 2010-06-25 NOTE — Patient Instructions (Addendum)
blood tests are being ordered for you today.  We will forward your test results to [facility]. Please make a follow-up appointment in 3 months

## 2010-06-25 NOTE — Progress Notes (Signed)
  Subjective:    [name], male    DOB: [birth year], 77 y.o.   MRN: [id number]  HPI pt states he feels well in general--better than at last ov.  Our office has [facility], and has obtained a record of his cbg's.  It varies from 110-400.  It is in general higher as the day goes on.   Past Medical History  Diagnosis Date  . ANEMIA 01/15/2010  . DIABETIC FOOT ULCER 01/15/2010  . UNSPECIFIED PERIPHERAL VASCULAR DISEASE 01/15/2010  . BACK PAIN, LUMBAR 01/15/2010  . POLYNEUROPATHY 01/15/2010  . HYPONATREMIA 01/15/2010  . DEPRESSION 01/15/2010  . HYPERTENSION 01/15/2010  . 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 DYSLIPIDEMIA 01/15/2010  . HYPOKALEMIA 01/15/2010  . GERD 01/15/2010  . IDDM 01/15/2010   No past surgical history on file.  reports that he has quit smoking. He does not have any smokeless tobacco history on file. His alcohol and drug histories not on file. Retired Lives at [employer] [facility]. Married. family history is negative for Diabetes. Allergies not on file  Review of Systems denies hypoglycemia sxs.    Objective:   Physical Exam elderly, frail, no distress.  In wheelchair. Ext:  Bandages present.  No leg edema    Fructosamine=310 (converts to a1c of 6.8) Lab Results  Component Value Date   HGBA1C 6.6* 06/25/2010   Assessment & Plan:  Dm is still overcontrolled.

## 2010-06-26 LAB — FRUCTOSAMINE: Fructosamine: 310 umol/L — ABNORMAL HIGH (ref <285)

## 2010-07-17 NOTE — Assessment & Plan Note (Signed)
[facility]   NAME:  SANTE, BIEDERMANN                  ACCOUNT NO.:  [id number]   MEDICAL RECORD NO.:  [id number]      DATE OF BIRTH:  1929/03/15   PHYSICIAN:  Joanne Gavel, M.D.         VISIT DATE:  08/24/2008                                   OFFICE VISIT   HISTORY OF PRESENT ILLNESS:  Mr. Carpenter is a 77 year old male with multiple  venous ulcerations and possibly also there is an arterial component and  a neuropathic component.   PHYSICAL EXAMINATION:  A left plantar ulcer 1.5 x 1.6, a right heel  ulcer 1.0 x 1.0, a left posterior leg ulceration which is 1.9 x 1.0, and  a left medial ankle 3.5 x 1.4.  Aside from the left lateral leg which  has healthy-appearing base, the others have thick slough and I believe  there will benefit by Santyl dressings.   PLAN:  Puracol and hydrogel to left lateral wound.  The right heel, left  medial, and plantar are wounds dressed with Santyl and Adaptic and to be  changed 3 times a week.  See the patient in 7 days.      Joanne Gavel, M.D.  Electronically Signed     RA/MEDQ  D:  08/24/2008  T:  08/25/2008  Job:  [id number]

## 2010-07-17 NOTE — Assessment & Plan Note (Signed)
[facility]   NAME:  [name], [name]                  ACCOUNT NO.:  [id number]   MEDICAL RECORD NO.:  [id number]      DATE OF BIRTH:  1929/04/17   PHYSICIAN:  Maxwell Caul, M.D.      VISIT DATE:                                   OFFICE VISIT   [name] is a gentleman we have [facility]  for essentially 3 major wounds at this point;  1. He has a wound on the medial aspect of his left ankle just inferior      to the medial malleolus.  2. On the left plantar foot.  3. On the right heel.   All of these wounds have essentially been in the same state and have  been receiving Puracol AG.  There has been some improvement in the  general appearance of all of these wounds.  All of them underwent a  light non-excisional debridement to remove surface slough.  We then  reapplied the Puracol AG to his major wounds.  He is to continue in the  healing sandal.  To the left leg, the Puracol, hydrogel will be covered  by Adaptic, Kerlix, and Coban.  To the right heel, the Puracol, hydrogel  will be covered by a dry dressing.  We will see him again in 2 weeks.           ______________________________  Maxwell Caul, M.D.     MGR/MEDQ  D:  10/20/2008  T:  10/20/2008  Job:  [id number]

## 2010-07-17 NOTE — Consult Note (Signed)
[name], [name]                  ACCOUNT NO.:  [id number]   MEDICAL RECORD NO.:  [id number]          PATIENT TYPE:  REC   LOCATION:  FOOT                         FACILITY:  [facility]   PHYSICIAN:  Jonelle Sports. Cheryll Cockayne, M.D. DATE OF BIRTH:  06-06-1929   DATE OF CONSULTATION:  04/20/2008  DATE OF DISCHARGE:                                 CONSULTATION   HISTORY:  This 77 year old white male who is currently a resident of the  [facility] is seen for management of a plantar ulcer on the  left foot.  He has, in addition, areas of dermatitis extending up on the  inner right ankle and up almost to mid calf and also a small open ulcer  just inferior to the malleolus on that side.   On the right heel posteriorly, he has a small pressure area that is  blister like in appearance.   These apparently have been present for several weeks and most recently  had some topical treatments applied by [facility] (Dr. Leanord Hawking) without improvement and he is now referred for more  aggressive treatment here.   The patient is formally a patient in this facility and has a fairly  detailed history on the file such as simply not available at this moment  due to his inability to provide it.   We do know that he has a history of tuberculosis, chronic asthma, and  emphysema, has had back and neck surgery in the past presumably due to  degenerative disk disease.  He has longstanding diabetes with neuropathy  and insensate feet.  He has history of chronic alcohol abuse with  chronic liver disease on that basis and he has known cataracts.   He does not currently smoke, and of course, where he does not have  access to alcoholic beverage, etc.   Regular medications include Precose, Lantus twice daily, OxyContin,  Ultram, folic acid, multiple vitamin, hydrochlorothiazide, vitamin C,  aspirin, Prilosec, potassium chloride, Lyrica, MiraLax, Pamelor, Zoloft,  Imdur, Lopressor, Lopid,  TriCor, clonidine, Norvasc, lisinopril,  hydralazine, and Artificial Tears.  The exact doses of these are  recorded in the nurses' notes and are not repeated here.   He has no known medicinal allergies.   On examination today, blood pressure is 115/63, pulse 76, respirations  18, temperature 97.8.  This is an elderly gentleman who appears his  stated age and is in no immediate distress.  He does not have jaundice  or ascites despite his known chronic liver disease.  He is cooperative  with the examiner but not reliable in his history that he is able to  give.   In his lower extremities, pulses are palpable but diminished.  He has a  small pressure area on the posterior aspect of his right heel which  measures approximately  0.9 x 0.9 cm and is covered with a blister.   On his left lower extremity, there is evidence of an inflammatory  dermatitis extending from about mid calf on the medial aspect of the leg  down over the  entire medial malleolar area through the instep and onto  the plantar aspect of the foot.  At the malleolus itself, there is a  small ulcer measuring 0.5 x 0.3 x 0.2 cm which is fairly clean in its  base and so forth.   On the plantar aspect of the foot, there is an ulcer which is partially  covered by calloused skin which is very macerated and it is only after  debridement that we can determine the size of this ulcer 3.0 x 2.6 x 0.2  cm.  There is modest drainage but no specific odor.  More proximal to  this, there is a more shallow ulcer measuring 0.9 x 2.0 x 0.1 cm again  covered largely with macerated skin.   IMPRESSION:  Diabetes mellitus with plantar and medial malleolar ulcers  on the left and posterior heel pressure sore on the right.   DISPOSITION:  1. The wound on the right heel is debrided selectively unroofing the      skin and this is then treated with an application of Neosporin and      covered with a protective pad.  2. The wound on the medial  malleolar aspect of the left lower      extremity requires no debridement and it will be treated with an      application of ketoconazole and a small protective pad.  3. The plantar ulcers are both widely debrided selectively of the      macerated skin and loose tissue at the margins of the wound and      then they are treated likewise with applications of ketoconazole      covered then with heavy alginate padding and absorptive pad and      that extremity will be placed in a total contact cast (EZ Cast).  4. The receiving facility will be instructed to change the dressing on      the right heel, cleansing it and replacing it with similar dressing      on a daily basis.  They are encouraged to leave the total contact      cast unattended but to protect it from injury or moisture.  5. Followup visit will be here in 5 days.  6. Incidentally, fungal and bacterial cultures were made from the      plantar ulcer on the left foot.           ______________________________  Jonelle Sports. Cheryll Cockayne, M.D.     RES/MEDQ  D:  04/20/2008  T:  04/21/2008  Job:  [id number]

## 2010-07-17 NOTE — Assessment & Plan Note (Signed)
[facility]   NAME:  [name], [name] NO.:  [id number]   MEDICAL RECORD NO.:  [id number]      DATE OF BIRTH:  Niemczyk 19, 1931   PHYSICIAN:  Maxwell Caul, M.D.      VISIT DATE:                                   OFFICE VISIT   [name] is a gentleman we have been [facility]  for essentially 3 major wounds at this point.  He has a wound on the  medial aspect of his left ankle just inferior to the medial malleolus, 2  on the left plantar foot roughly the fourth metatarsophalangeal joint on  the right heel lateral aspect.  All of these wounds have been  essentially in the same state and is receiving for Puracol AG.  All of  them have some improvement in the general appearance.  None of them  required debridement or culture today.  I was somewhat concerned about  the degree of erythema in the left leg and put in a call to the facility  to see if this was felt to be changed.  I will follow up on this.  The  right leg has chronic venous stasis changes.  Both of them, I felt  needed, Kerlix, Coban wraps.  We applied TCA bilaterally.   To the wounds we continue with the Puracol AG hydrogel regimen.  I have  previously thought about putting Apligraf on all of these wounds and  that will certainly be a consideration if we stall at this point.           ______________________________  Maxwell Caul, M.D.     MGR/MEDQ  D:  11/03/2008  T:  11/04/2008  Job:  [id number]

## 2010-07-17 NOTE — Assessment & Plan Note (Signed)
[facility]   [name], [name]                  ACCOUNT NO.:  [id number]   MEDICAL RECORD NO.:  [id number]      DATE OF BIRTH:  Dec 22, 1929   PHYSICIAN:  Jake Shark A. Tanda Rockers, M.D. VISIT DATE:  08/27/2006                                   OFFICE VISIT   SUBJECTIVE:  [name] is a 77 year old man whom we have followed for  bilateral Wagner 2 diabetic foot ulcers.  Over the last 2 weeks.  We  have treated him with Iodosorb ointment daily with antiseptic soap  washes and offloading utilizing his custom shoes and the wearing of  cotton socks.  He returns.  He denies excessive pain, malodor or fever.  He is accompanied by his wife.   OBJECTIVE:  Blood pressure is 139/82, respirations 20, pulse rate 72,  temperature 98.2.  Inspection of the wounds is as follows.  Wound #1 on the left plantar  surface shows decrease in area.  There is 100% granulation.  No evidence  of infection.  Wound #7 on the right second toe shows a healthy  granulating base following removal of the impacted Iodosorb.  Wound #8  similarly shows dramatic improvement with decrease in volume.  There is  no drainage from either wound.  There is no evidence of active  infection.  The capillary refill is normal.  The pulse is slightly  palpable bilaterally.   ASSESSMENT:  Clinical improvement with offloading and Iodosorb gel.   PLAN:  We will continue local care utilizing antiseptic soap, the  topical application of Iodosorb gel daily, and continuation of  offloading with [place] custom shoes.  We will reevaluate the patient in 2  weeks.  We have given the patient and the wife an opportunity to ask  questions.  They seem to understand.  They are encouraged and express  gratitude for having been seen in the clinic      Harold A. Tanda Rockers, M.D.  Electronically Signed     HAN/MEDQ  D:  08/27/2006  T:  08/27/2006  Job:  [id number]

## 2010-07-17 NOTE — Assessment & Plan Note (Signed)
[facility]   NAMEHERRON, FERO                  ACCOUNT NO.:  [id number]   MEDICAL RECORD NO.:  [id number]      DATE OF BIRTH:  11-26-1929   PHYSICIAN:  Jonelle Sports. Sevier, M.D.  VISIT DATE:  08/04/2008                                   OFFICE VISIT   HISTORY:  This 77 year old white male is followed for a diabetic foot  ulcer on the plantar aspect of the left foot, near the fourth metatarsal  head area, as well as for several venous stasis-type wounds on the  distal lower extremities and one is probably a pressure area on the  posterior aspect of his right heel.  Since he was last seen by me  several weeks ago, he has indeed made significant improvement in all of  these wounds and frankly, his legs overall looked the best I have seen.  He arrives today really with only 2 wounds of particular concern, the  one is the plantar wound which now measures 1.5 x 1.5 x 0.1 cm and has a  reasonably clean base with a little surrounding callus.  His right heel  wound measures 1.3 x 1.5 x 0.1 and still is slightly shaggy in the base  but with no surrounding erythema, infection, and so forth.  What  previously had been called an ulcer and a old scar area on the medial  aspect of his left ankle, now is dry and appears essentially healed and  a small posterior lesion on his left lower extremity has essentially  healed as well.  He still has a great deal of dry skin and scaling.  He  has had no fever, chills, or systemic symptoms.   EXAMINATION:  Blood pressure is 118/54, pulse 69, respirations 22,  temperature 98.3.  The wound sizes are as indicated above.  The patient  is in no distress.   IMPRESSION:  Satisfactory course of multiple wounds on bilateral lower  extremities, diabetic, pressure, and venous stasis in nature.   DISPOSITION:  The plantar wound is debrided of its surrounding callus  otherwise needs very little.  Likewise, the posterior heel wound is not  quite  ready for any active debridement.   We will continue with Santyl and Adaptic and an absorptive gauze on the  heel wound, and this will be changed 3 times weekly at his receiving  facility.  We will use Puracol Ag and hydrogel on the plantar wound.  This likewise will be changed 3 times weekly.  We have treated his legs  extensively with UltraMide 25 to deal with a scaly skin, and he has been  placed in bilateral Kerlix and Coban wraps.  These will be replaced with  each dressing change at the receiving facility.   Followup visit will be here in 1 week.           ______________________________  Jonelle Sports. Cheryll Cockayne, M.D.     RES/MEDQ  D:  08/31/2008  T:  09/01/2008  Job:  [id number]

## 2010-07-17 NOTE — Assessment & Plan Note (Signed)
[facility]   NAMEASAPH, SERENA                  ACCOUNT NO.:  [id number]   MEDICAL RECORD NO.:  [id number]      DATE OF BIRTH:  [birth year]   PHYSICIAN:  Jonelle Sports. Sevier, M.D.  VISIT DATE:  06/22/2008                                   OFFICE VISIT   HISTORY:  This 77 year old white man, who is confined in a continuing  care institution, is followed for several wounds on his lower extremity  to include a lateral pressure wound on his right heel, a plantar ulcer  underlying his fifth MP joint area on the plantar aspect of the left  foot, and also a very superficial ulcer on the medial malleolar aspect  of that left lower extremity.   He has been in a total contact cast on the left lower extremity since  his last visit and this apparently has been well tolerated.   PHYSICAL EXAMINATION:  Blood pressure 145/69, pulse 73, respirations 18,  and temperature 97.7.  Blood glucose determination not recorded, but was  done at his facility this morning.   The wound on the plantar aspect of the left foot now measures 0.7 x 0.3  x 0.1 cm, looks quite healthy, still has some surrounding callus.  The  right lateral heel wound prior to debridement measures 0.9 x 0.7 x 0.2  cm, but does have some overhang of skin from approximately 11 o'clock to  6 o'clock.  This is estimated at about 2 mm.   There is no significant drainage or odor and the wound base looks  reasonably healthy.   The area of the left medial malleolus is partially epithelialized, but  continues apparently to weep and has some accumulated debris, which  appears to be somewhat macerated over much of the surface of that foot.   IMPRESSION:  Generally, satisfactory course of multiple wounds of both  lower extremities.   DISPOSITION:  The wound on the right heel is debrided by obliterating  the undermining or overlap of the loose skin and the wound appears  pretty satisfactory with very little needing to be  done to the wound  base.   The wound on the plantar aspect of the left foot is debrided of  surrounding callus and loose skin at the wound with selective  debridements.   The wound on the medial aspect of the left lower extremity at the  malleolar area is debrided of the accumulated debris and macerated  tissue.   The wounds will then be treated with an Allevyn pad to that on the right  heel, Neosporin and Allevyn pad to that on the plantar aspect of the  left foot, and silver alginate with a SofSorb pad to the medial  malleolar area on the left.  The left lower extremity then be returned  to a total contact cast.  The wound on the dorsal PIP joint of the right  second toe, which is a hammertoe has healed and that will be treated  simply with application of a Silopad.   The receiving facility is given instructions to leave all dressings in  place and to keep them clean and dry.   They are encouraged try to keep the Silopad on the patient's second  toe  as we have done here.   Follow up visit will be here in 1 week.           ______________________________  Jonelle Sports. Cheryll Cockayne, M.D.     RES/MEDQ  D:  06/22/2008  T:  06/22/2008  Job:  [id number]

## 2010-07-17 NOTE — Assessment & Plan Note (Signed)
[facility]   NAMEMarland Kitchen  ALANN, AVEY NO.:  [id number]   MEDICAL RECORD NO.:  [id number]      DATE OF BIRTH:  06-Aug-1929   PHYSICIAN:  Maxwell Caul, M.D. VISIT DATE:  09/08/2008                                   OFFICE VISIT   LOCATION:  [facility].   Mr. Macek is a gentleman, who is a type 2 diabetic.  We have been  following him chronically for diabetic ulcers on his feet as well as  probable coexistent venous stasis ulcers on his distal lower  extremities.  Currently, his wounds include a wound on the right heel,  his left medial ankle, his left plantar foot, and the left lower  extremity posteriorly.  The wounds on his heel and the plantar foot are  diabetic wounds.  The other wounds are probably stasis.  Most recently  Dr. Cheryll Cockayne has been following him.  He has been getting Santyl to the  wounds on his legs, largely Puracol AG and hydrogel to the diabetic  wounds on his feet.   On examination, his temperature is 98 at September 04, 2008.  All of these  wounds look dramatically improved to me since the last time, I saw him.  The left plantar foot wound measures 1.9 x 1.9 x 0.3 and this is a  healthy-looking wound without any drainage.  The area on the right heel  measures 1.9 x 1.7 x 0.3, again much healthier than the last time I saw  this, and although this is not changed all that dramatically in terms of  size and certainly appearance of the wound is better.  He has an  extensive venous stasis ulcer on the left medial ankle just below the  medial malleolus.   IMPRESSION:  Combined diabetic and venous stasis ulcerations.  I have  changed all of these dressings to Puracol AG, hydrogel.  We have used  Kerlix, Coban wraps, which will be changed at the facility 3 times a  week.  He is to continue in the bilateral healing sandal.  We will see  him again in 2 weeks.           ______________________________  Maxwell Caul, M.D.     MGR/MEDQ  D:  09/08/2008  T:  09/09/2008  Job:  [id number]

## 2010-07-17 NOTE — Assessment & Plan Note (Signed)
[facility]   NAMEKINGSTEN, ENFIELD                  ACCOUNT NO.:  [id number]   MEDICAL RECORD NO.:  [id number]      DATE OF BIRTH:  [birth year]   PHYSICIAN:  Leonie Man, M.D.    VISIT DATE:  06/08/2008                                   OFFICE VISIT   PROBLEMS:  1. Diabetic foot ulcer left foot at the plantar fourth metacarpal      phalangeal joint.  2. Left shin ulcer which is new, probably secondary to chafing in his      easy cast.  3. Left medial malleolar wound, now fully epithelialized but with      erythema.  4. Left heel lateral plantar ulcer.  5. Right heel Achilles insertion ulcer.  6. Second toe right foot proximal interphalangeal joint hammertoe with      ulcer.   HISTORY OF PRESENT ILLNESS:  Mr. Tienda is a 77 year old gentleman  currently a resident in a nursing home with a history of asthma,  emphysema, diabetes 2 with nephropathy, alcoholic liver disease, and  hypertension.  He is being followed by our [facility] for the  wound issues as outlined above.   His current therapy as of May 25, 2008, he had a callus excision  around his diabetic foot ulcer and had this covered with an Allevyn pad  and to his left heel and left foot and he had an easy cast placed on the  left side.  On the right foot, he had an Allevyn pad placed on the right  heel and a Silopad placed over the second toe.  He returns today for  reevaluation.  He is an obese man who shows some mild dementia and is a  poor historian.  He is here with his wife who has been taking care of  him in the nursing home.   PHYSICAL EXAMINATION:  His vital signs were stable.  1. On examination, the diabetic foot ulcer on the left side at the      fourth MP joint is clean.  There is no drainage.  On probing, there      is no penetration down to the toe bone or tendon.  This is a Wagner      grade 2 ulcer.  2. Left shin.  There is a stage II abrasion over the shin which is      clean.   This Descoteaux have been caused by some shear injury and is      within the cast.  3. Left medial malleolar ulcer is now clean.  However, this area is      somewhat macerated and is erythematous, although it is not tender.  4. Left heel is small and clean, also without any odor or discharge,      not requiring debridement.  5. Right heel is clean, also without odor or discharge.  No      debridement required.  6. Second toe right foot is clean with a small eschar subjacent to      which are clean epithelializing tissues.   In general, the periwound skin of all wounds with the exception for the  left medial malleolar area is intact.  The left medial malleolar area  does shows some erythema around this area of maceration.  There is a  mild inflammatory dermatitis of both legs with associated edema.  The  left leg being somewhat more smaller than the right.   ASSESSMENT:  Modest improvement as compared to his previous estimate on  May 25, 2008.  Today's treatment, the wounds were cleaned and there  was no debridement necessary.  The left lower extremity and the Allevyn  pad was placed over the shin wounds and the plantar wounds and total  contact cast is placed on the left  lower extremity.  On the right side, I removed the toenail of the hallux  of the right foot which was being sloughed off.  We put a toe sock on  his second and Allevyn pad on the right heel.   We will follow up with him in 1 week for cast change and wound  evaluation.      Leonie Man, M.D.  Electronically Signed     PB/MEDQ  D:  06/08/2008  T:  06/09/2008  Job:  [id number]

## 2010-07-17 NOTE — Assessment & Plan Note (Signed)
[facility]   NAMEMarland [name]  DYKE, WEIBLE                  ACCOUNT NO.:  [id number]   MEDICAL RECORD NO.:  [id number]      DATE OF BIRTH:  1929-05-10   PHYSICIAN:  Joanne Gavel, M.D.         VISIT DATE:  07/20/2008                                   OFFICE VISIT   This is a 77 year old diabetic male with several ulcerations of the left  foot.  There is a plantar surface ulceration and there was a medial  malleolus ulceration.  He has been treated with total contact casting.  Today, the plantar wound is 0.7 x 0.1 cm and is very superficial.  There  is a tiny abrasion which is new on the lateral aspect of the left foot  and the medial malleolus lesion appears essentially healed.   PLAN:  Neosporin and Allevyn to the abrasion of the lateral aspect of  the foot.  Neosporin and Allevyn on the plantar surface.  Allevyn on the  medial malleolar surface and total contact casting.   FOLLOW UP:  See in 7 days.      Joanne Gavel, M.D.  Electronically Signed     RA/MEDQ  D:  07/20/2008  T:  07/21/2008  Job:  [id number]

## 2010-07-17 NOTE — Assessment & Plan Note (Signed)
[facility]   NAMEHORRACE, [name]                  ACCOUNT NO.:  [id number]   MEDICAL RECORD NO.:  [id number]      DATE OF BIRTH:  1929/10/21   PHYSICIAN:  Leonie Man, M.D.    VISIT DATE:  05/12/2008                                   OFFICE VISIT   PROBLEM?  WagnerI DFU left foot  [name] is a 77 year old gentleman and a resident of the [facility].  He is being followed for a plantar ulcer, diabetic foot, Wagner  1 on his left foot at the fourth metatarsal head.  He also has a small  ulcer on the right heel, which is healing well.  The ulcer of his left  heel is now completely healed with just some loose eschar, which was  removed.   On examination, the plantar ulcer at the metatarsal head on the left  foot is clean.  There is some mild amount of callus overlying the wound,  which is trimmed away.  The base of the wound appears to be clean.  On  probing the wound, there is no sinus track going deep into the foot.   This wound and the heel wound are now treated with hydrogel and a bulky  wrap.  This is to be changed every other day by the nurses at the living  center and followup here will be in 2 weeks. Consideration for an EZ  Cast will be made at that time      Leonie Man, M.D.  Electronically Signed     PB/MEDQ  D:  05/12/2008  T:  05/13/2008  Job:  [id number]

## 2010-07-17 NOTE — Assessment & Plan Note (Signed)
[facility]   NAMEMarland Kitchen  GINA, COSTILLA                  ACCOUNT NO.:  [id number]   MEDICAL RECORD NO.:  [id number]      DATE OF BIRTH:  1930-01-09   PHYSICIAN:  Joanne Gavel, M.D.         VISIT DATE:  07/13/2008                                   OFFICE VISIT   HISTORY OF PRESENT ILLNESS:  A 77 year old male with diabetic foot  ulcers possibly with a venous stasis also present.  He had a total  contact cast on his left foot, this has been removed.  It shows mostly  healing particularly of the medial malleolus, but the plantar wound is  still present and unchanged.  It does not require debridement today.  The right foot, however, has a heel ulcer which appears to be  reactivated.  This was debrided including removal of slough and some  surrounding skin and it is now bright red at its base.  For the left  foot, we will continue Allevyn dressings and a full contact casting.  For the right heel, we will use Allevyn or Profore dressing and see in 7  days.      Joanne Gavel, M.D.  Electronically Signed     RA/MEDQ  D:  07/13/2008  T:  07/14/2008  Job:  [id number]

## 2010-07-17 NOTE — Assessment & Plan Note (Signed)
[facility]   NAMEMICHAIAH, [name]                  ACCOUNT NO.:  [id number]   MEDICAL RECORD NO.:  [id number]      DATE OF BIRTH:  1929/12/10   PHYSICIAN:  Jonelle Sports. Sevier, M.D.  VISIT DATE:  07/06/2008                                   OFFICE VISIT   HISTORY:  This 77 year old white male was followed for a diabetic foot  ulcers on the plantar aspect of his left foot at the first MP joint  area, one on the posterior aspect of his left heel and big macerated  venous drainage area on the left medial malleolar area.   When seen last week, he had apparently scratched underneath his total  contact cast with coat hanger and had induced some damage to the left  pretibial area.  This was covered with an Allevyn pad.  Upon cast  removal today, the Allevyn pad was nowhere in evidence and there was  some neatly folded paper towels there.  Despite all this, he denies any  manipulation of the wound.   Examination today shows the macerated area on the left medial malleolar  area to be somewhat better, although there is still considerable  superficial debris there.  The right heel wound is dramatically improved  and appears to have one layer of epithelialization on it.  The plantar  wound on the left measures 0.6 x 0.6 x 0.2 cm and again has some slough,  but generally looks improved.   Last week he had for the first time a new wound over the medial aspect  of the first MP joint likely sustained with cast removal with a cast  cutter having generated a linear wound here.  That had been treated with  application of Steri-Strips.  That area now measures 2.0 x 0.3 x 0.2 cm  having dehisced slightly, but appears to be progressing toward healing.   IMPRESSION:  Improvement all wounds, right heel and left lower  extremity.   DISPOSITION:  The plantar wound on the left foot is debrided of the  slough and loosened skin without difficulty.  It was then treated with  an application  of triple antibiotic and Allevyn pad.  The wounds on the  anterior pretibial area do not require debridement.  They are treated  with an application of Allevyn pad.   The right heel wound does not require any debridement and again was  treated with an application of Allevyn pad.   The left medial malleolar area is debrided of lot of macerated skin  debris, but really appears better than it has in the past.  It was  treated again with an application of silver alginate, covered by an  absorptive pad.   The left lower extremity was then placed in a total contact cast.   Followup visit will be in 1 week.          ______________________________  Jonelle Sports. Cheryll Cockayne, M.D.    RES/MEDQ  D:  07/06/2008  T:  07/07/2008  Job:  [id number]

## 2010-07-17 NOTE — Assessment & Plan Note (Signed)
[facility]   NAMETAYVEON, LOMBARDO                  ACCOUNT NO.:  [id number]   MEDICAL RECORD NO.:  [id number]      DATE OF BIRTH:  19-Dec-1929   PHYSICIAN:  Maxwell Caul, M.D. VISIT DATE:  09/09/2006                                   OFFICE VISIT   PURPOSE AT TODAY'S VISIT:  Review of Wegener's grade II by the bilateral  diabetic wounds.  He has been treated with Iodosorb ointment daily with  antiseptic soap washes and offloading using his custom shoes and cotton  socks.  He returns.  He denies excessive pain, malodor, or fever.  He is  accompanied by his wife.   EXAMINATION:  Temperature is 97.5, pulse 91, respirations 18, blood  pressure is 188/92.  CBG 126.  WOUND EXAM:  The aforementioned wounds, which have been recorded in our  wound planner, are all considerably improved.  The wound on the right  second toe previously labored as #7, the wound on his left second toe  previously labeled as #8, all are close to being resolved.  The area on  his left plantar surface is almost completely epithelialized.  I am  optimistic that everything is going to heal over.   IMPRESSION:  Wegener's grade II diabetic wounds.  These have all  considerably improved.  I am going to go ahead and discontinue him on  the antiseptic soap washes.  His custom-made foot wear seems to be doing  a good job on this and we will follow him in the facility.  I do not  think there is a need for him to return here.  His wife was somewhat  concerned about further breakdown.  However, I will try and follow this  in the nursing facility.  He is discharged to his own custom foot wear.           ______________________________  Maxwell Caul, M.D.     MGR/MEDQ  D:  09/09/2006  T:  09/09/2006  Job:  [id number]

## 2010-07-17 NOTE — Assessment & Plan Note (Signed)
[facility]   NAMEMOHANAD, CARSTEN                  ACCOUNT NO.:  [id number]   MEDICAL RECORD NO.:  [id number]      DATE OF BIRTH:  Jun 25, 1929   PHYSICIAN:  Lenon Curt. Chilton Si, M.D.   VISIT DATE:  10/13/2008                                   OFFICE VISIT   HISTORY:  A 77 year old male who reports to Wound Care for recheck of  multiple diabetic ulcers of his leg and feet.  The wounds will be  described below.  He feels that he is doing fairly well.  There  continues to be erythema and scaling of his lower legs.  There is  perhaps a new scab on the right shin in the upper area that has not  really fully opened up yet.  Other wounds are about the same as  previously described but certainly appeared clean in regards to wound  base at this time.   PHYSICAL EXAMINATION:  Wound #15 on the left plantar surface is a very  chronic wound measuring 1.4 x 1.3 x 0.1 cm.  It is over the fifth  metatarsal head.  There is pink material which penetrates rather deeply  and I think the depth of the wound actually is more than 0.1 cm noted  above.  However, this tissue is reasonably clean appearing and healthy  pink in color.  Wound #19 of the left medial ankle is very shallow.  It  is 4.4 x 1.3 x 0.1 cm.  It has new tissue growth from the bottom and  edges and again appears quite clean and there is a wound of the left  lower extremity now measuring 3.0 x 3.0 x 0.1 cm in diameter and the  wound to the right heel measuring 1.1 x 1.3 x 0.1.  The right heel  ulceration has some darkening of the central portion.  It does not show  any evidence of purulent drainage or odor.  This wound is not as healthy  as the others in appearance.  He will ultimately need some debridement.  We elected to let it mature longer before and this was done.   TREATMENT:  All wounds described above received Puracol AG, hydrogel,  Adaptic, and gauze dressing.  This is to be repeated every 3 days at his  nursing  home at [facility] on [address].   There continue to be areas of scabbing at the right hallux major and  right second toe which only need to be observed and to have Neosporin  and a Band-Aid applied daily.   ICD-9 CODE:  707.14, ulcer of the heel.  707.15, ulcer of the foot.  707.13, ulcer of the ankle.  250.83 diabetes mellitus with ulcer.      Lenon Curt Chilton Si, M.D.  Electronically Signed     AGG/MEDQ  D:  10/13/2008  T:  10/13/2008  Job:  [id number]   cc:   [facility]

## 2010-07-17 NOTE — Assessment & Plan Note (Signed)
[facility]   [name], [name]                  ACCOUNT NO.:  [id number]   MEDICAL RECORD NO.:  [id number]      DATE OF BIRTH:  Sep 24, 1929   PHYSICIAN:  Jonelle Sports. Sevier, M.D.  VISIT DATE:  07/27/2008                                   OFFICE VISIT   HISTORY:  This is a 77 year old white male who is rather debilitated is  followed for diabetic ulcers one on the plantar aspect of the left foot  at the fifth metatarsal head area, another medially on the left foot at  the first MP joint area, and one in the posterior supramalleolar aspect  of the left heel and ankle.  In addition, he has had an ulcer on the  right posterior heel, which is thought to be primarily pressure in  nature.  The left lower extremity has been treated with total contact  cast with appropriate topical treatments to the wounds and the right  heel has been treated with application of Neosporin and Aleve.   The patient arrives today with a note from the home health nurse  indicating that there has been more drainage from the right heel.  He  has had no fever, chills, or systemic symptoms.   PHYSICAL EXAMINATION:  VITAL SIGNS:  Blood pressure 124/69, pulse 54,  respirations 16, and temperature 98.2.   Three wounds on the left foot are documented in the chart and all are  making progress.   The wound on the right heel now measures 0.2 x 0.2 x 0.2 cm, does not  probe to bone, but there is considerable surrounding erythema and warmth  to include up into the fatty part of the calf in that leg itself.  This  certainly would indicate likely cellulitis, if not some underlying  osteomyelitis.   IMPRESSION:  Satisfactory course and diabetic foot ulcers, left lower  extremity, possible deep infection in association with right heel ulcer.   DISPOSITION:  The wounds do not specifically require debridement today,  those on the left foot are treated with applications of Aleve and that  extremity is  placed in a total contact cast.   The right heel wound is cultured and following this, the patient was  placed on doxycycline 100 mg b.i.d. for 3 days, then 100 per day with a  total of 16 given.  The right lower extremity wound has been cultured  and that area will be x-rayed to rule out osteomyelitis.   The patient's follow up visit will be here in 1 week.           ______________________________  Jonelle Sports. Cheryll Cockayne, M.D.     RES/MEDQ  D:  07/27/2008  T:  07/28/2008  Job:  [id number]

## 2010-07-17 NOTE — Assessment & Plan Note (Signed)
[facility]   NAMEJACON, [name]                  ACCOUNT NO.:  [id number]   MEDICAL RECORD NO.:  [id number]      DATE OF BIRTH:  1929-03-11   PHYSICIAN:  Leonie Man, M.D.    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   HPI  77 year old morbidly obese gentleman.  The patient has 3 major  ulcerations on his right heel.  There is a 1.0 x 1.0 x 0.2 ulcer.  Next  on the left plantar surface, there is a 0.3 x 0.3 x 0.2 plantar ulcer  overlying the fourth metatarsophalangeal joint and on the area of the  left heel medially, there is a diabetic foot ulcer that is posterior to  the lateral malleolus.  The patient was treated with some selective  debridement, Allevyn pads and a left-sided total contact cast on his  last visit here.  He returns today for evaluation.   EXAM  The patient does not appear to be in any acute distress.  On examination, his temperature is 98, pulse 56, respirations 18, and  blood pressure 137/73.  There is a significant amount of slough on the left posterior medial  malleolar area.  This is debrided with a curette down to cleaner  granulating tissues.  The right-sided heel ulcer is clean and does not  require any debridement.  However, the patient does have significant  amount of venous stasis and multiple bullous formations over the  anterior right leg and significant swelling.  We will treat his right  heel ulcer with an Allevyn pad, and we will put him in a Profore Lite.  Hopefully, this will significantly offload his heel.  On the left leg, I  decided not to put his contact cast back on but to treat this left ankle  ulcer with which is posterior to the malleolus with a calcium alginate  and Promogran.  The left plantar will be treated with an offloading felt  strip and the foot will be placed in a healing sandal.   DISPOSITION:  We  will follow up with [name] in 1 week.      Leonie Man, M.D.  Electronically Signed     PB/MEDQ  D:  08/03/2008  T:  08/04/2008  Job:  [id number]

## 2010-07-17 NOTE — Assessment & Plan Note (Signed)
[facility]   NAMECORBETT, MOULDER                  ACCOUNT NO.:  [id number]   MEDICAL RECORD NO.:  [id number]      DATE OF BIRTH:  November 26, 1929   PHYSICIAN:  Joanne Gavel, M.D.              VISIT DATE:                                   OFFICE VISIT   PROBLEMS:  1. Diabetic foot ulcer, left foot, fourth metacarpophalangeal joint.  2. Left shin ulcer.  3. Left medial malleolar wound, almost fully epithelialized.  4. Left heel plantar wound.  5. Right heel Achilles ulcer  6. Second toe right foot proximal interphalangeal joint hammertoe with      ulcer.   HISTORY OF PRESENT ILLNESS:  The patient was seen 7 days ago.  He is a  diabetic with nephropathy.  He was treated with Allevyn pads on the  wounds, on the left and right foot and received a total contact cast on  the left.   PHYSICAL EXAMINATION:  VITAL SIGNS:  Stable.  GENERAL:  The ulcers are essentially unchanged.  He complains of pain  only in the heel ulcer on the right foot.  There is no sign of  inflammation or infection in this area.  There is some desquamation  superficially of all the periwound skin, but aside from some stasis  changes of both calves the situation appears good.   ASSESSMENT:  Improvement is very modest, but certainly no sign of  increased difficulty.  No debridement is necessary.  Previous dressings  are replaced.  Follow up in 1 week for wound evaluation and cast change.           ______________________________  Joanne Gavel, M.D.     RA/MEDQ  D:  06/15/2008  T:  06/16/2008  Job:  [id number]

## 2010-07-17 NOTE — Assessment & Plan Note (Signed)
[facility]   NAMEMONTREY, [name]                  ACCOUNT NO.:  [id number]   MEDICAL RECORD NO.:  [id number]      DATE OF BIRTH:  1929-06-24   PHYSICIAN:  Jonelle Sports. Sevier, M.D.  VISIT DATE:  05/25/2008                                   OFFICE VISIT   HISTORY OF PRESENT ILLNESS:  This is a 77 year old white male resident  of [facility] who has been followed primarily for a diabetic foot  ulcer on the plantar aspect of the left foot underlying the fourth  metatarsal head.  He has from time to time had another little minor  bumps and bruises and is not particularly attentive to the self-care of  his lower extremities.  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   The repeated accumulation of heavy callus on his left foot shows that  our efforts at offloading with a posted healing sandal have not been  satisfactory.   He comes today with a new area of partial-thickness skin avulsion on the  anterior pretibial area on the right and also a developing pressure sore  on the dorsal aspect of the PIP joint of the right second toe which is a  hammertoe.   As far as the general symptoms of concern, he is oblivious to any  particular problems.   PHYSICAL EXAMINATION:  Blood pressure 162/92, pulse 69, respirations 20,  and temperature 98.1.  The ulcer on the plantar aspect of his left foot  now measures 1.1 x 0.2 cm, is not malodorous, has minimal drainage, but  has again much surrounding callus on the large part of the weightbearing  aspect of that foot.   On the ankle area, that foot is a rough area that is not clearly an open  ulcer, but measures approximately 0.2 x 0.2 x 0.1 cm.   In addition on the right heel, he has an old ulcer which is healed and  has had eschar removed.  On the right pretibial area, he has a small  partial-thickness abraded area measuring 0.9 x 0.1 cm.   He does indeed have a  pressure area on the dorsal aspect of the right  second toe measuring 0.2 x 0.3 x 0.1 cm with a tiny opening therein.   IMPRESSION:  Failure to progress, left plantar diabetic foot ulcer,  likely secondary to failure to offload.   The small skin avulsion in the pretibial area on the right developing  pressure sore on the dorsal aspect of the right second toe.   DISPOSITION:  The ulcer itself does not require direct debridement on  the left foot, but he does require repairing away of extensive callus  extending around the margins of that wound.   None of his other wounds require any debridement at this point.   He will be treated with an Allevyn pad to the right heel, Silopad to the  right second toe and his wife is given a second one and I have asked her  to come to manage that in terms of this being a difficult item for the  facility to keep up with.   The partial-thickness skin avulsed area  on the right pretibial area will  be treated with Neosporin and a Band-Aid to be changed daily.   The ulcer on the left foot will be treated with an application of  Neosporin and absorptive pad and the rough area on the medial aspect of  that heel will be covered with an Allevyn pad and that extremity will be  placed in EasyCast.   The patient does use a walker and all involved are cautioned that this  Backes change his whole balance and he will have to be extremely careful to  avoid falling.   Followup visit here will be in 1 week for cast change and then in 2  weeks to the physician.           ______________________________  Jonelle Sports. Cheryll Cockayne, M.D.     RES/MEDQ  D:  05/25/2008  T:  05/26/2008  Job:  [id number]

## 2010-07-17 NOTE — Assessment & Plan Note (Signed)
[facility]   NAME:  HILLIS, MCPHATTER NO.:  [id number]   MEDICAL RECORD NO.:  [id number]      DATE OF BIRTH:  1930/02/08   PHYSICIAN:  Maxwell Caul, M.D. VISIT DATE:  04/25/2008                                   OFFICE VISIT   Mr. [name] is a gentleman who has [facility]  for diabetic-related foot ulcers.  He had a reopening of a wound on his  left plantar foot, and he has been referred here for further evaluation.  He was seen by Dr. Cheryll Cockayne last week who noted a wound on the right heel  and a wound on the left plantar foot.  He also noted a wound on the  medial malleolar aspect of the left lower extremity with significant  coexistent maceration.  He described the ketoconazole cream widely to  the lower extremity, an alginate dressing, absorptive pad, and put the  patient in a TCC.  The right heel had Neosporin and Allevyn pad.   Cultures done by Dr. Cheryll Cockayne grew methicillin-resistant staph aureus from  the left plantar wound.  The patient returns in followup.   On examination, he is afebrile.  The area on the left medial surrounding  the left calcaneus, left medial malleolus, and extending up into the  left lateral leg is macerated, erythematous, and warm.  He is insensate,  so he does not feel much pain; however, I could not quite tell whether  this was a contact dermatitis, perhaps a fungal dermatitis, or a  cellulitis.  The wound on the left plantar foot was debrided of some  surface slough, but does not look unhealthy.  Also, the right heel wound  appears to be on its way to resolving.   IMPRESSION:  1. Wagner grade 2 wound, left plantar foot.  We continued with a      silver-based dressing to this in the form of Silverlon, a bulky      dressing, and put him back in a healing sandal.  2. Question cellulitis, left leg.  I am really uncertain whether this      degree of erythema extended for what Dr.  Cheryll Cockayne saw or not and      whether the etiology of this was contact dermatitis from maceration      or whether this with cellulitis or plus or minus a fungal      cellulitis.  I prescribed Lotrisone.  I gave him a course of      doxycycline and rifampin.  Because of my uncertainty about this      area of his left leg, I did not put him back in a total contact      cast.  We will follow this in the facility.  3. Right heel decubitus.  We continued with Neosporin and a protective      dressing today.  We applied an Allevyn Heel.   I will follow him in the facility.  I contacted the facility nurse to  let her know about my observations.  She actually came with the patient  last week and noted some maceration around the small wound on the medial  malleolus.  However, I think this is more extensive and I simply cannot  rule out cellulitis.  Therefore, doxycycline and rifampin was started.  He will follow this closely in the nursing home facility and also in 2  weeks' time here.           ______________________________  Maxwell Caul, M.D.     MGR/MEDQ  D:  04/25/2008  T:  04/26/2008  Job:  [id number]

## 2010-07-17 NOTE — Assessment & Plan Note (Signed)
[facility]   NAMEMURLIN, SCHRIEBER                  ACCOUNT NO.:  [id number]   MEDICAL RECORD NO.:  [id number]      DATE OF BIRTH:  [birth year]   PHYSICIAN:  Jonelle Sports. Sevier, M.D.  VISIT DATE:  06/29/2008                                   OFFICE VISIT   HISTORY:  This 77 year old white male has been followed for diabetic  foot ulcers on the posterior aspect of the right heel at the area of the  Achilles tendon insertion and another on the plantar aspect of the left  foot and finally a third one on the pretibial area on the left.  In  addition, he has a generally weepy area not actually ulcerated on the  medial aspect of his left ankle and he also has a small rubbed area on  the dorsal aspect of the PIP joint of the right second toe.   He reports generally no problems since his last visit here.  He has been  in a total contact cast on the left lower extremity and with focal  dressings on the right.   PHYSICAL EXAMINATION:  Blood pressure 148/67, pulse 69, respirations 18,  and temperature 97.5.   The patient seems to have a new injury today in the area of the left  first MP joint, a quite linear in nature and breathing briskly, which  would appear to have been generated unintentionally with the cast  removal saw.  This area is cleaned up and a small amount of Neosporin  was applied and using tincture and benzoin for adhesability, the area is  pulled together with Steri-Strips and should do quite well.   His plantar ulcer on that left lower extremity now measures 0.2 x 0.2 x  0.1 cm and is rapidly approaching healing with no spreading inflammation  and no concerns regarding infection.   The wound on the posterior right heel measures 0.9 x 1.0 x 0.2 cm and  again is quite clean.   The draining sort of macerated the area on the medial aspect of the left  lower extremity at the ankle area persist.   Of concern on the right lower extremity is considerable  warmth,  erythema, and edema in the most of that distal calf, which would appear  to be associated with some degree of cellulitis.   IMPRESSION:  Satisfactory course with the multiple diabetic foot wounds  with the exception of possible cellulitis, now right lower extremity.   DISPOSITION:  1. The new wound on the left first metatarsophalangeal area is dressed      as described above.  2. The wound on the plantar aspect of the left foot will be dealt with      an application of Neosporin and an Allevyn pad.  The area on the      medial ankle that will be covered with silver alginate.  The entire      extremity will receive 0.1% triamcinolone cream application and      that extremity will then be placed in a total contact cast.   The right lower extremity will be treated likewise with a widespread  application of 0.1% triamcinolone cream.  The heel wound will be treated  with Neosporin  and an Allevyn pad.  The toe will be protected with a  sore pad and that extremity will be placed in a Profore wrap with  approximately 15% reduction in tension.   Because the concern of cellulitis in that extremity and with his having  previously had MRSA in that heel ulcer, he is to be restarted on his  doxycycline and rifampin, which he had taken for a course earlier since  under our care.   Follow up visit here will be in 1 week.            ______________________________  Jonelle Sports. Cheryll Cockayne, M.D.     RES/MEDQ  D:  06/29/2008  T:  06/30/2008  Job:  [id number]

## 2010-07-17 NOTE — Assessment & Plan Note (Signed)
[facility]   NAME:  [name], [name]                  ACCOUNT NO.:  [id number]   MEDICAL RECORD NO.:  [id number]      DATE OF BIRTH:  [birth year]   PHYSICIAN:  Maxwell Caul, M.D. VISIT DATE:  09/22/2008                                   OFFICE VISIT   HISTORY:  Mr. Woulfe is a gentleman we have been following for a difficult  set of diabetic wounds.  He also has probably coexistent venous stasis  on his distal lower extremities.  We have largely been following wounds  on the right heel, his left medial ankle, and his left plantar foot.  I  have been using Puracol AG to all of these wounds.   PHYSICAL EXAMINATION:  His temperature is 97.6.  Although, the wounds  look considerably better than what I have seen him in the past, they  really have not changed much from his last visit on September 08, 2008.  The  area on the left medial ankle underwent a nonexcisional debridement of  surface slough, callus, and eschar.  This cleans up nicely.  The area on  the right heel measuring 1.3 x 1.5 x 0.3 does not look much different  from last time.  In similar fashion, the left plantar foot wound does  not look any different at all.   IMPRESSION:  Combined diabetic and venous stasis ulceration.  I  continued the Puracol AG hydrogel based dressings under Kerlix and Coban  wrap.  I have asked for an Apligraf to see if we can get these wounds to  close down some.  At that point, I Charrette put him in bilateral total  contact cast to fully protect the wound area.  We will see him again in  2 weeks.           ______________________________  Maxwell Caul, M.D.     MGR/MEDQ  D:  09/22/2008  T:  09/23/2008  Job:  [id number]

## 2010-07-17 NOTE — Assessment & Plan Note (Signed)
[facility]   NAME:  [name], [name]                  ACCOUNT NO.:  [id number]   MEDICAL RECORD NO.:  [id number]      DATE OF BIRTH:  Harnois 27, 1931   PHYSICIAN:  Maxwell Caul, M.D. VISIT DATE:  08/18/2008                                   OFFICE VISIT   [name] is a 77 year old man that we have been following for severe  venous stasis ulcerations, probably complicated by arterial  insufficiency.  He has wounds on his left medial malleolus and left  lateral leg.  He also has a wound on the right heel, which is probably  mostly a decubitus ulceration.  Most recently, he has been treated with  mostly alginate-based dressings to the left leg under a Profore Lite.   On examination, he returns today.  He is afebrile.  Both of his legs had  tense edema, I think secondary to severe venous stasis.  All of his  wounds on the left leg all underwent nonexcisional debridements except  for the one on the medial and the lateral left calf.  We applied silver  alginate to all the wounds with an Xtrasorb over the left medial  malleolus.  The area on the right heel, we applied Puracol Ag, a dry  dressing and we put both legs in Profore Lites.  I will review the  wounds with a wound care nurse at the nursing home facility where he  lives on Monday.  He is already on Keflex, and I think this should  continue.   IMPRESSION:  Severe bilateral venous stasis disease.  We have dressed  the left leg predominately with silver alginate, the right heel with  Puracol, both legs in Profore Lite, and we continued with the Keflex  that has been ordered.           ______________________________  Maxwell Caul, M.D.     MGR/MEDQ  D:  08/18/2008  T:  08/19/2008  Job:  [id number]

## 2010-07-17 NOTE — Assessment & Plan Note (Signed)
[facility]   [name], [name]                  ACCOUNT NO.:  [id number]   MEDICAL RECORD NO.:  [id number]      DATE OF BIRTH:  1929/09/07   PHYSICIAN:  Jonelle Sports. Sevier, M.D.  VISIT DATE:  05/04/2008                                   OFFICE VISIT   HISTORY:  This 77 year old resident of [name] is being followed  for a plantar ulcer, diabetic in nature, on the left foot at the fourth  metatarsal head area.  He also has a small ulcer on the heel of that  foot and another at the medial ankle area there.   He arrives today also with a small ulcer on the lateral aspect of the  right heel, which he says occurred when somebody pulled off loose skin.   He was found to have a few staph growing in his primary wound a week  ago, MRSA in nature and so was begun on doxycycline and rifampin, which  he apparently has tolerated adequately.   Blood pressure 194/86, pulse 69, respirations 18, temperature 97.6.  The wounds are all characterized in the nurse's notes in the chart with  exact measurements etc.  Basically, they all look satisfactory and  should be expected to heal although the offloading of the principal  ulcer on the plantar aspect of the left foot apparently is a problem.   IMPRESSION:  Gradual progress in principal diabetic foot ulcer of left  foot and also other minor pressure-type lesions.   DISPOSITION:  The primary wound is debrided both of a small amount of  slough in its base and of loose skin and surrounding callus at its  margins.  They remains fairly granular, does not have any striking  amount of drainage, and is free of odor.   The wound on the posteromedial aspect of the left heel, as well as that  on the medial left ankle and the new wound on the right heel all looks  satisfactory and require no significant debridement.  Some dry flaky  skin is removed from several areas, but this is really not a significant  debridement.   The wounds  will be treated with applications of Neosporin and absorptive  pads.  He will be maintained in the healing sandal with 2 layers of felt  padding posting that left foot to proximal to the wound.   The receiving facility is instructed to continue him on his antibiotics  as before and to change his dressings on a daily basis, cleansing the  wounds, and replacing the Neosporin and appropriate dressings.   Followup visit will be here in 8 days because I am having the facility  to obtain a comprehensive metabolic panel and make sure there has been  no intolerance of either the doxycycline or the rifampin.           ______________________________  Jonelle Sports. Cheryll Cockayne, M.D.     RES/MEDQ  D:  05/04/2008  T:  05/05/2008  Job:  [id number]

## 2010-07-17 NOTE — Assessment & Plan Note (Signed)
[facility]   NAMESHAARAV, [name]                  ACCOUNT NO.:  [id number]   MEDICAL RECORD NO.:  [id number]      DATE OF BIRTH:  06/26/1929   PHYSICIAN:  Jonelle Sports. Cheryll Cockayne, M.D.  VISIT DATE:  09/28/2008                                   OFFICE VISIT   HISTORY:  This 77 [facility] is being  followed for multiple wounds, some probably diabetic in nature and  others venous stasis related.  He has a plantar ulcer in the left fifth  metatarsal head area, an ulcer on the tip of the right second toe, an  ulcer on the medial left ankle, and several stasis ulcers on the right  lower extremity as well as a pressure ulcer on the posterior right heel.  Most recently, these have all been treated similarly because of the  limitations to the patient's management where he is located and that  management has consisted of Puracol Ag, hydrogel, dry dressings, Kerlix,  and Coban wraps to the extremities.  These have been changed three times  weekly at the nursing center.   The patient had been seen last week by Dr. Leanord Hawking, who had suggested  putting Apligraf on all these wounds just to try to reduce the wound  burden.   The patient arrives today reporting no real change.  His right posterior  heel lesion remains the most painful all of his lesions to him.   He also has 1 new area of abraded skin on the right pretibial area.   PHYSICAL EXAMINATION:  Blood pressure 143/61, pulse 54, respirations 19,  temperature 98.5, and blood sugar 145 mg/dL at the facility this  morning.   All the various wounds as outlined above have been measured and those  are recorded in the nurse's notes within the chart, will not be repeated  in detail here.   It is noted that none of the wounds at this point are entirely clean and  certainly not clean enough to place Apligraf, but my concern would be  that with the conditions and the degree of followup at the  facility  where he is located, it will be hard to justify the cost and other  issues associated with the use of Apligraf.   Of some concern is the fact that the patient now apparently has some  cellulitis with warmth and swelling in the right lower leg more  anteriorly than posteriorly and probably related to the low-grade  cellulitis secondary to his stasis and/or traumatic wounds as opposed to  being related to the right posterior heel pressure ulcer.  All the other  ulcers are essentially the same in size as they have been and none  appear to be hugely problematic.   IMPRESSION:  1. Multiple wounds, diabetic and stasis.  2. Probable cellulitis, right lower extremity.  3. Probable overall situation, which dictates more or less Palliative      Care.   DISPOSITION:  Now, the wounds are actively debrided today, all will be  treated with reapplications of Puracol Ag, hydrogel, bulky dressings,  and then a Kerlix wrap.  He will be placed on cephalexin 500 mg t.i.d.  for total of 10 days for his  right lower extremity cellulitis.  There is  nothing specifically that I could culture with any confidence  that it would be related to what Harjo be in that extremity and if he  fails to respond, he Kage need to go on antistaphylococcal medication.   Follow up visit will be here in 1 week.           ______________________________  Jonelle Sports. Cheryll Cockayne, M.D.     RES/MEDQ  D:  09/28/2008  T:  09/29/2008  Job:  [id number]
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
## 2010-07-17 NOTE — Assessment & Plan Note (Signed)
[facility]   [name], [name]                  ACCOUNT NO.:  [id number]   MEDICAL RECORD NO.:  [id number]      DATE OF BIRTH:  Oct 09, 1929   PHYSICIAN:  Jonelle Sports. Sevier, M.D.  VISIT DATE:  08/10/2008                                   OFFICE VISIT   HISTORY:  This 77 year old white male with vascular compromise in his  lower extremities as well as venous stasis, has been followed for a  diabetic foot ulcer on the plantar aspect of the left foot at the fifth  metatarsal head area, a stasis-type lesion on the medial aspect of the  left heel, and a posterior lesion at the right heel.  He had previously  been treated with total contact cast, but this has been poorly  tolerated.  Since coming out of the cast, he has now developed a new  partial-thickness wound on the posterolateral aspect of the left calf  which appears to be stasis in etiology.  It measures 5.0 x 5.5 cm.  The  other wounds are as described in the nurse's notes.   IMPRESSION:  Extremely difficult patient who is in essentially a  palliative care situation with [place], who now has a new stasis wound in the  left lower extremity as well.   DISPOSITION:  The plantar wound on the left is sharply debrided of some  callus and semi-necrotic material.  It is then treated with a donut for  its protection and silver alginate.  The new wound on the left lateral  calf is treated with application of an Adaptic pad.  The left medial  malleolar wound likewise is treated with calcium alginate, and then that  entire left lower extremity was placed in a Profore Lite dressing and  the patient in a healing sandal.  The wound on the right heel is doing  satisfactorily and is treated simply with application of a healing pad  and is kept in healing sandal there as well.  He is to continue on  cephalexin at his present dose which was first prescribed last week, and  he is to use moisturizing lotion on the right lower  extremity.   His followup visit will be here in 1 week.           ______________________________  Jonelle Sports. Cheryll Cockayne, M.D.     RES/MEDQ  D:  08/10/2008  T:  08/11/2008  Job:  [id number]

## 2010-07-20 NOTE — Assessment & Plan Note (Signed)
[facility]   [name], [name]                  ACCOUNT NO.:  [id number]   MEDICAL RECORD NO.:  [id number]      DATE OF BIRTH:  11/04/1929   PHYSICIAN:  Theresia Majors. Tanda Rockers, M.D. VISIT DATE:  10/25/2005                                     OFFICE VISIT   SUBJECTIVE:  [name] is a 77 year old man who has undergone hyperbaric  oxygen treatment and concurrent antibiotic treatment for a Wagner's grade 3  diabetic foot ulcer.  He has completed 28 dives of an ordered 30 dives.  He  is for wound evaluation.  During the interim he has had less drainage.  There has been no pain and no fever.   OBJECTIVE:  VITAL SIGNS:  Blood pressure 190/86, respirations 18, pulse rate  72 and regular.  He is afebrile.  His capillary blood glucose is 162 mg%.  EXTREMITIES:  Inspection of the wound shows a completely granulated bed on  the volar aspect of the left foot at the head of the metatarsal.  There is  no excessive drainage and there is no malodor.  An area of thick exudate was  sharply debrided using a #10 blade with hemorrhage control with direct  pressure.  There was little or no pain.   ASSESSMENT:  Improved wound.   PLAN:  We are recommending that we proceed with the placement of an Apligraf  and the continuation of HBO for an additional 10 dives, for a total of 40  dives.  We will proceed with insurance pre-certification.  In the interim,  we will continue his dives.  We will place him in a Promogram dressing with  off-loading provided by a Cam walker.           ______________________________  Theresia Majors Tanda Rockers, M.D.     [name]  D:  10/25/2005  T:  10/25/2005  Job:  [id number]

## 2010-07-20 NOTE — H&P (Signed)
NAME:  [name], [name]                            ACCOUNT NO.:  [id number]   MEDICAL RECORD NO.:  [id number]                   PATIENT TYPE:  INP   LOCATION:  1843                                 FACILITY:  [facility]   PHYSICIAN:  Lonia Blood, M.D.                   DATE OF BIRTH:  01/05/1930   DATE OF ADMISSION:  10/18/2003  DATE OF DISCHARGE:                                HISTORY & PHYSICAL   REASON FOR ADMISSION:  Weakness.   HISTORY OF PRESENT ILLNESS:  This is a 77 year old white male brought to the  emergency room by his wife who said that he has been progressively getting  weak in the past days to weeks, and the patient has been sitting for the  most part on the recliner and drinking.  Per wife also, the patient has not  only been unable to move but has had some incontinence of urine too.  Wife  was tired and unable to take care of the patient any more.  The patient  denied any trauma.  He just mentions elements of back pain.  He has had a  previous history of staph sepsis with osteomyelitis in 1988 with some  elements of myelopathy.  The patient is not a good historian.  The wife has  already left the hospital and indicated that she does not want to be called  regarding the patient's care unless it is an absolute emergency.   PAST MEDICAL HISTORY:  From records:  1. Hemachromatosis.  2. Diabetes type 2.  3. Hypertension.  4. 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  5. Incarcerated hernia status post repair in September 2003.  6. Previous Cardiolite that was negative in 2003 by Dr. Patty Sermons.   MEDICATIONS:  Unknown at this point.   ALLERGIES:  The patient has no known drug allergies.   FAMILY HISTORY/SOCIAL HISTORY:  Not obtainable at this point because the  patient is mildly obtunded.   REVIEW OF SYSTEMS:  Grossly positive.  Patient answers yes to virtually  everything most likely secondary to his obtundation.   PHYSICAL EXAMINATION:  VITAL SIGNS:   Temperature is 98.4, blood pressure  153/78, pulse is 118, respiratory rate is 22.  His sats are 98% on room air.  GENERAL:  He is alert.  He is confused, obese, no acute distress.  HEENT:  He is disheveled with some poor dentition.  No pallor.  Mild  jaundice.  NECK:  Supple.  No lymphadenopathy.  CHEST:  Clear to auscultation bilaterally with no wheezes.  HEART:  Tachycardic with no murmurs.  ABDOMEN:  Obese, nontender, with positive bowel sounds, with questionable  ascites.  GU:  The patient has evidence of incontinence.  Difficulty passing urine.  His bladder seems full.  EXTREMITIES:  Show 3+ pedal edema bilaterally.  SKIN:  Mottled and unkempt.   His labs showed sodium of  129, potassium is 4, chloride 96, BUN is 31,  creatinine is 1.  White count is 9, hemoglobin 13.5, platelets 93.  His CK  is 160 total.  UA shows an orange urine with trace hemoglobin, trace  bilirubin, ketones of 40, total protein 30, positive leukocyte esterase,  urine WBCs 3-6 with many bacteria and some hyaline casts.  His alcohol level  is 295.  UDS also is positive for benzodiazepines.   ASSESSMENT:  This is a 77 year old gentleman with a history of  hemachromatosis, questionable diabetes, and hypertension brought in by wife  secondary to gradual worsening and weakness and failure to thrive.  The most  likely picture based on all the archives is a history of alcohol  intoxication in patient at this point and history is not really readily  available.  Other possible causes of the patient's symptoms include  depression secondary to his medical issues and his inability to walk Howry  also be related to the myelopathy and surgery he had on the back previously.  The patient seems to have anasarca and some tell tale signs of liver  disease.   PLAN:  1. Alcohol intoxication.  We will admit the patient to telemetry, observe     him closely, and start him on Librium taper for alcohol withdrawal.  In     the  meantime, we will give him thiamine and folate and follow him     closely.  We will also check Tylenol level if his LFTs seem to be     elevated.  2. Hyponatremia.  This is most likely secondary to some liver involvement.     We will check LFTs at this point and see what the patient's liver     function looks like.  I suspect the patient Parr have had cirrhosis from a     combination of his hemachromatosis and his alcohol abuse which Bozza have     been chronic.  We will cautiously observe the patient, not give him so     much fluid at this point, until we known what his liver status is.  In     the meantime, however, the patient has massive lower extremity swelling.     We will probably start him on something like spironolactone at this     point.  3. Thrombocytopenia.  This Schermerhorn also be related to his alcohol abuse as     mentioned above.  4. Bacteruria and pyuria.  The patient Agar have some elements of urinary     tract infection, other possibilities are benign prostatic hypertrophy or     prostatitis.  He is not febrile and there is no evidence of     pyelonephritis.  However, since the patient is symptomatic and having     problems passing urine, we will insert a Foley in the patient, and we     will culture his urine.  In the meantime, I will empirically start him on     Cipro times three days.  5. Mild acidosis.  The patient's bicarb is 19 which is slightly acidotic but     his gap is only 4 indicating that it is probably not a non-gap acidosis     Fennema be related to his alcoholism.  6. History of diabetes.  The patient has a remote history of diabetes,     however, his sugar looks okay today.  We will go ahead and check a     hemoglobin A1c  and see what happens.  7. Hypertension.  __________  hypertension from previous history and the     patient's blood pressure is elevated today.  However, it is not clear    what is going on, so I will start him on spironolactone for both his      ascites versus lower extremity edema and we will also follow his blood     pressure while in the hospital.   DISPOSITION:  We will get case manager involved for both his polysubstance  abuse as well as placement since the patient's wife seems to be having  problem.  She is already angry and left and insists that she can not take  care of the patient on her own.                                                Lonia Blood, M.D.    Verlin Grills  D:  10/18/2003  T:  10/19/2003  Job:  [id number]

## 2010-07-20 NOTE — Assessment & Plan Note (Signed)
[facility]   NAMEZACHARIUS, FUNARI                  ACCOUNT NO.:  [id number]   MEDICAL RECORD NO.:  [id number]      DATE OF BIRTH:  07-11-1929   PHYSICIAN:  Theresia Majors. Tanda Rockers, M.D.      VISIT DATE:                                   OFFICE VISIT   VITAL SIGNS:  Blood pressure 172/71, respirations 20, pulse rate 67,  temperature 98.3.   PURPOSE OF TODAY'S VISIT:  Mr. [name] returns for followup of a diabetic  foot ulcer of his left volar foot.  He has also been treated for an  atopic dermatitic lesion on the medial aspect of the left foot.  In the  interim, he denies excessive drainage, malodor, pain, or fever.   WOUND EXAM:  Inspection of the lower extremity shows that there is trace  edema.  The wound on the left plantar surface is clean.  There is no  exudate.  There appears to be advancement of epithelium.  There is no  exposed bone or tendon.  There is no evidence of infection.  Wound #4 on  the right second toe has a clean eschar and is determined to be healed.  Wound #5 of the left ankle has responded to the Protopic ointment.   DIAGNOSIS:  Improved wounds as per above.   MANAGEMENT PLAN & GOAL:  We will continue the offloading healing sandal  and reevaluate the patient in 2 weeks.  In the interim, he will continue  to have daily antiseptic soap washings of the foot and will utilize the  offloading healing sandal.  If he develops recurrence of the ulceration  on the medial left foot, we will consider resumption of Protopic  ointment.  We have explained this approach to the patient and his wife  in terms that they seem to understand.  We will make a copy of this note  available to the nursing center at [facility].           ______________________________  Theresia Majors. Tanda Rockers, M.D.     [name]  D:  03/17/2006  T:  03/17/2006  Job:  [id number]   cc:   Joetta Manners [facility]

## 2010-07-20 NOTE — Assessment & Plan Note (Signed)
[facility]   NAMECARRICK, RIJOS                  ACCOUNT NO.:  [id number]   MEDICAL RECORD NO.:  [id number]      DATE OF BIRTH:  04-Feb-1930   PHYSICIAN:  Theresia Majors. Tanda Rockers, M.D. VISIT DATE:  03/31/2006                                   OFFICE VISIT   VITAL SIGNS:  Blood pressure is 182/89, respirations 24, pulse rate 82,  temperature 98.1.  Capillary blood glucose was 335 mg percent.   PURPOSE OF TODAY'S VISIT:  Mr. Clabaugh returns for followup of a left  plantar ulcer associated with diabetes.  In the interim, we have treated  him with healing sandal and felt strip offloading. In the interim he  denies that there has been excessive drainage, malodor, pain, or fever.   WOUND EXAM:  Inspection of the lower extremity shows that wounds #4 and  #5 have completely resolved.  The area on the plantar surface of the  left foot shows dramatic decrease in size.  There are areas of  desquamation and callus which underwent a debridement.  There was a  small area of necrosis which underwent a full thickness debridement with  hemorrhage control with direct pressure.   ASSESSMENT:  Clinical response with improving wound, diabetic foot  wound.   MANAGEMENT PLAN & GOAL:  We will continue offloading with the healing  sandal.  We will continue daily antiseptic soap washings and a cotton  white sock for dressing.  We will reevaluate the patient in two weeks.           ______________________________  Theresia Majors Tanda Rockers, M.D.     [name]  D:  03/31/2006  T:  04/01/2006  Job:  [id number]

## 2010-07-20 NOTE — Assessment & Plan Note (Signed)
[facility]   NAMEFREDDIE, [name]                  ACCOUNT NO.:  [id number]   MEDICAL RECORD NO.:  [id number]      DATE OF BIRTH:  Apr 19, 1929   PHYSICIAN:  Jake Shark A. Tanda Rockers, M.D. VISIT DATE:  08/13/2006                                   OFFICE VISIT   SUBJECTIVE:  [name] is a 77 year old man who we follow for multiple  Wagner grade 2 diabetic foot ulcers involving his lower extremities.  In  the interim he has been treated with Iodosorb gel, daily antiseptic soap  washes and wearing custom shoes.  He returns complaining of new  breakdowns on both feet.  There has been no interim fever or excessive  pain, drainage or malodor.  The patient is accompanied by his wife.   OBJECTIVE:  Blood pressure 183/96, respirations 18, pulse rate 93,  temperature 97.7.  Inspection of the lower extremities shows that there  has been a blister over the right second toe, as well as the left second  toe.  These blisters have spontaneously ruptured leaving behind  nonviable tissue.   Full-thickness debridements were performed of both wounds #7 and 8  without difficulty.  The patient tolerated the debridement well.  Hemorrhage was controlled with direct pressure.  Wound #1 on the left  plantar surface underwent a full-thickness debridement of nonviable  tissue.  Wound #1 shows clinical improvement.  Wounds #7 and 8 are new  and appear to be the result of direct pressure.   PLAN:  We have changed the wound care orders to include the continuation  of antiseptic soap washes daily, thorough drying of the feet, and the  topical application of Iodosorb gel.  He is to continuously wear his  custom orthotics except when he is in the bed.  We will reevaluate this  patient in two weeks to assess his response to offloading therapy and  infection control.  There is no evidence of ascending infection.  There  is no evidence of associated ischemia.      Harold A. Tanda Rockers, M.D.  Electronically Signed     HAN/MEDQ  D:  08/13/2006  T:  08/13/2006  Job:  [id number]

## 2010-07-20 NOTE — Assessment & Plan Note (Signed)
[facility]   NAME:  [name], [name]                  ACCOUNT NO.:  [id number]   MEDICAL RECORD NO.:  [id number]            DATE OF BIRTH:   PHYSICIAN:  Theresia Majors. Tanda Rockers, M.D. VISIT DATE:  10/02/2005                                     OFFICE VISIT   SUBJECTIVE:  [name] is a 77 year old man who is currently undergoing  hyperbaric oxygen treatment for a left Wagner grade 3 ulceration of his left  foot.  During the interim he has been treated with a bulky dressing and  support offloading.  He reports that there has been some drainage and  malodor.  He denies fever or pain.  Today he has completed 13 of a total  ordered 30 dives.   VITAL SIGNS:  His blood pressure is 176/100, respiratory rate 20, pulse rate  is 94, he is afebrile.   PURPOSE OF TODAY'S VISIT:   WOUND EXAM:  Inspection of the left lower extremity shows that there is a  hyperemic area surrounding the volar ulceration.  The ulcer was  photographed, measured and entered into the wound expert.  There was a halo  of erythema and a moderate amount of drainage.  Areas of nonviable tissue  and desquamated skin were sharply debrided with hemorrhage controlled with  silver nitrate.  There was minimum pain.   WOUND SINCE LAST VISIT:   CHANGE IN INTERVAL MEDICAL HISTORY:   DIAGNOSIS:   TREATMENT:   ANESTHETIC USED:   TISSUE DEBRIDED:   LEVEL:   CHANGE IN MEDS:   COMPRESSION BANDAGE:   OTHER:   ASSESSMENT:  Probable concurrent methicillin-resistant Staphylococcus aureus  infection.   MANAGEMENT PLAN & GOAL:  We will continue his HBO, continue serial  debridements as needed with daily dressing change to include __________ gel.  In addition, we will add doxycycline 100 mg by mouth twice daily and Septra  DS 1 by mouth twice daily.  We will re-evaluate the patient's wound in one  week but we will continue his daily HBO treatment per protocol.           ______________________________  Theresia Majors.  Tanda Rockers, M.D.     [name]  D:  10/02/2005  T:  10/02/2005  Job:  [id number]

## 2010-07-20 NOTE — Assessment & Plan Note (Signed)
[facility]   NAMEBROOX, LONIGRO                  ACCOUNT NO.:  [id number]   MEDICAL RECORD NO.:  [id number]      DATE OF BIRTH:  1929-06-24   PHYSICIAN:  Theresia Majors. Tanda Rockers, M.D. VISIT DATE:  01/27/2006                                     OFFICE VISIT   VITAL SIGNS:  The blood pressure is 170/80, respirations 16, pulse rate 72,  and he is afebrile.   PURPOSE OF TODAY'S VISIT:  [name] is a 77 year old man who we have followed  for a Wagner grade 3 diabetic foot ulcer.  This wound responded to  hyperbaric oxygen treatment and off-loading.  He is now completely healed.  We had fitted him with custom shoes and inserts.  He presented a week ago  with some areas of abrasion due to tight fitting of the shoe.  He was  referred back to the orthotist for shoe modification.  He returns for  followup.  In the interim, he has worn a Darco healing sandal.   WOUND EXAM:  The left third toe dorsal ulcer is resolved.  There is an area  of full thickness blister, but this appears to be resolving.  There has been  no evidence of continued trauma from the shoe.  On the left foot, there is  no ulcer at all.  We have recommended that the patient resume.   DIAGNOSIS:  No evidence of continued injury.  Apparent adequate adjustment  of inserts and shoes.   MANAGEMENT PLAN & GOAL:  The patient is to resume wearing his custom shoes  and inserts.  He is to inspect his feet frequently.  We have sent  instructions to the institution in which he resides.  If any evidence of  injury is noted, he is to discontinue wearing the shoes and be reevaluated  by the orthotist, or to be re-referred to the Wound Center.  The patient and  his wife seem to understand.  We will reevaluate him in 2 weeks p.r.n.           ______________________________  Theresia Majors. Tanda Rockers, M.D.     [name]  D:  01/27/2006  T:  01/27/2006  Job:  [id number]

## 2010-07-20 NOTE — Assessment & Plan Note (Signed)
[facility]   NAMEMarland Kitchen  [name], [name]                  ACCOUNT NO.:  [id number]   MEDICAL RECORD NO.:  [id number]      DATE OF BIRTH:  August 03, 1929   PHYSICIAN:  Maxwell Caul, M.D. VISIT DATE:  11/01/2005                                     OFFICE VISIT   PURPOSE OF TODAY'S VISIT:  [name] was seen today in followup from his  hyperbaric oxygen.  He is currently being treated for a Wagner's grade 3  diabetic foot ulcer.  I am seeing him today for wound evaluation.  During  the interim, he has had less drainage, no pain or fever.   WOUND EXAM:  There is a completely granulated wound bed on the volar aspect  of his left foot at the head of the left fifth metatarsal.  There is no  excessive drainage and no malodor.  No debridement was felt to be required.   DIAGNOSIS:  Improved Wagner's grade 3 diabetic foot ulcer.  He is continuing  his hyperbaric oxygen treatments.  His dressings were not changed and  include Prisma, Hydrogel with a Profore Lite and Cam walker.      Maxwell Caul, M.D.     MGR/MEDQ  D:  11/01/2005  T:  11/02/2005  Job:  [id number]

## 2010-07-20 NOTE — Assessment & Plan Note (Signed)
[facility]   NAMEPHILLIPPE, [name]                  ACCOUNT NO.:  [id number]   MEDICAL RECORD NO.:  [id number]      DATE OF BIRTH:  12/03/1929   PHYSICIAN:  Jonelle Sports. Sevier, M.D.  VISIT DATE:  08/23/2005                                     OFFICE VISIT   VITAL SIGNS:  Examination reveals blood pressure of 158/98, pulse 64,  regular, respirations 24, temperature 98.4.   PURPOSE OF TODAY'S VISIT:  This 77 year old white male is seen by me for the  first time but has been followed for an extended period of time with an open  diabetic ulcer underlying the fifth metatarsal head on the left foot.  Apparently there was MRSA infection in this, and that has been treated, and  he is off all such treatment now.   The patient's wife says that his wound she thinks looks better, although it  is still source of occasional pain to him, but this is usually a shooting  pain that occurs without predictability and is not in any way directly  related to pressure or position.  There has been no increased odor, no  increased swelling, no fever or systemic symptoms.   As stated above, he has finished all his antibiotics.   WOUND EXAM:  Exam is otherwise limited to the left lower extremity, where on  its plantar surface there is a very shallow ulcer now, measuring 1.4 x 1.4 x  0.5 cm with a partially granulated, somewhat speckled base with minimal to  moderate serous drainage and with no odor.  There is some slight partial  thickness undermining at the wound margins.   DIAGNOSIS:  Satisfactory course of diabetic wound, plantar aspect, left  foot.   MANAGEMENT PLAN & GOAL:  1.  The wound is sharply debrided, partial thickness, to remove some of the      adjacent macerated callus and the slight partial-thickness overhang at      the wound's margins.  2.  The wound base is then treated with an application of Prisma, and this      is covered with Mepitel, in turn overlain with  Neosporin and an      absorptive pad.  The foot is then dressed with a dry dressing, and a      foam donut is placed around the area.  3.  He is returned to his posted healing sandal.  4.  A small amount of materials, namely Prisma and Mepitel, are sent with      the patient to the receiving facility along with directions on how the      wound should be dressed and the request that this be done every 3 days.  5.  Return visit here will be in 2 weeks.           ______________________________  Jonelle Sports Cheryll Cockayne, M.D.     RES/MEDQ  D:  08/23/2005  T:  08/23/2005  Job:  [id number]

## 2010-07-20 NOTE — Assessment & Plan Note (Signed)
[facility]   NAME:  [name], [name]                  ACCOUNT NO.:  [id number]   MEDICAL RECORD NO.:  [id number]            DATE OF BIRTH:   PHYSICIAN:  Theresia Majors. Tanda Rockers, M.D. VISIT DATE:  06/24/2006                                   OFFICE VISIT   VITAL SIGNS:  Blood pressure is 130/71, respirations are 18, pulse rate  63, temperature 97.6.  Capillary blood glucose is 140 mg%.   PURPOSE OF TODAY'S VISIT:  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  He returns for followup.  In the interim, he reports that there has been  recurrent blister formation on the tops of the right foot and there has  noted some drainage on the white sock that he wears on the left foot.  There has been no fever.  There is no excessive pain.  He continues to  be ambulatory.  He is accompanied by his wife.   WOUND EXAM:  Inspection of the right foot shows that there is minimum  edema.  There is a bounding dorsalis pedis pulse and there is no obvious  ulceration.  On the left foot, there is trace edema on the plantar  surface of the lateral remnant of the third metatarsal.  There is a  superficial ulcer that extends into the dermis.  There is no  inflammation. There is no evidence of infection.  The dorsalis pedis  pulse is readily palpable.  We have inspected his offloading healing  sandal and there appears to be undue pressure, which corresponds with  the ulceration.   DIAGNOSIS:  Plantar ulceration, likely related to inadequate offloading.   MANAGEMENT PLAN & GOAL:  We have readjusted his healing sandal by adding  a felt strip to offload the culprit area.   In addition, we have made a requisition requesting adjustment of his  custom shoes and orthotics to relieve the crowding in the toe box.  We  have instructed Ms. Tomasetti to return to [facility] with  the shoes and the  requisition and, perhaps, a modification can be made to allow for Mr.  Lagares to continue to wear the custom insert with, if necessary, removal of  the complete toe box from the shoe.  We will reevaluate the patient in 2  weeks.  In the interim, he will continue to wear the offloading healing  sandal.  We have given the patient, and his wife, an opportunity to ask  questions.  They seem to be satisfied with this approach and express  gratitude for having been seen in the clinic.      Harold A. Tanda Rockers, M.D.  Electronically Signed     HAN/MEDQ  D:  06/24/2006  T:  06/24/2006  Job:  [id number]

## 2010-07-20 NOTE — Assessment & Plan Note (Signed)
[facility]   NAMEMarland Kitchen  THEODEN, MAUCH                  ACCOUNT NO.:  [id number]   MEDICAL RECORD NO.:  [id number]      DATE OF BIRTH:  27-Sep-1929   PHYSICIAN:  Maxwell Caul, M.D.      VISIT DATE:                                   OFFICE VISIT   PURPOSE OF TODAY'S VISIT:  Followup Wagner's grade 2 wounds on the  plantar surface of his left foot and venous statis ulcer on the right  leg.  He has been using antiseptic soap washes to the plantar ulcer on  the left foot and being offload with a healing sandal.  He had received  a Profore to the right leg.  He reports no excessive drainage, fever or  pain.  He remains at [facility].   WOUND EXAM:  The wound on the right leg has healed.  There is good edema  control.  On the left plantar ulcer, this wound is contracting.  There  is good epithelization starting.   WOUND CARE PLAN AND FOLLOWUP:  The left plantar wound received a partial  thickness debridement.  No anesthesia was necessary.  There was no  hemostasis required.  I used a #10 blade for this.  The wound underneath  is epithelizing nicely.  We were applied a Profore of the right leg.  I  have written a prescription for bilateral below knee pressure stockings  20-40 mm.  I will discuss this with the facility next week.  His wife  tells me he has Medicare and Medicaid in the facility.   We will see him back in one week.  If we can obtain the gradient  pressure stockings I think the Profore can be removed at this point.  He  is to continue with the healing sandal.           ______________________________  Maxwell Caul, M.D.     MGR/MEDQ  D:  05/02/2006  T:  05/03/2006  Job:  [id number]

## 2010-07-20 NOTE — Assessment & Plan Note (Signed)
[facility]   NAME:  HODGES, TREIBER                  ACCOUNT NO.:  [id number]   MEDICAL RECORD NO.:  [id number]           DATE OF BIRTH:   PHYSICIAN:  Theresia Majors. Tanda Rockers, M.D. VISIT DATE:  10/08/2005                                     OFFICE VISIT   SUBJECTIVE:  Mr. Flannagan is seen in follow-up for a Wegner grade 3 ulceration on  the volar aspect of his left foot.  He is concurrently undergoing hyperbaric  oxygen treatment and has completed dive #17 out of a prescribed treatment  plan of 30.  He reports that there has been moderate drainage from the wound  but there has been no fever and no increased pain.   OBJECTIVE:  VITAL SIGNS:  Blood pressure is 176/96, respirations are 20,  pulse rate is 84, temperature is 98.9.  Capillary blood glucose is 299 mg  percent.  EXTREMITIES:  Inspection of the foot shows that there is moderate amount of  peri wound erythema.  The wound was photographed, cultured, and entered into  the Wound Expert.  Areas of nonviable tissue were debrided with a #10 blade  with hemorrhage control with direct pressure.  A Q-tip was used to probe the  wound, extend it down to periosteum.  There was some serous drainage, but  absolutely no malodor.   ASSESSMENT:  Improved wound but there appears to be significant failure to  off-load.   PLAN:  We will continue the HBO.  We will continue doxycycline and Septra  and we will place the patient in a Cam walker with fiberglass webbing to  avoid removal.  We will reevaluate the wound in 24-48 hours.           ______________________________  Theresia Majors Tanda Rockers, M.D.     [name]  D:  10/08/2005  T:  10/08/2005  Job:  [id number]

## 2010-07-20 NOTE — Assessment & Plan Note (Signed)
[facility]   NAMEGIAVANNI, ODONOVAN                  ACCOUNT NO.:  [id number]   MEDICAL RECORD NO.:  [id number]      DATE OF BIRTH:  [birth year]   PHYSICIAN:  Theresia Majors. Tanda Rockers, M.D. VISIT DATE:  01/06/2006                                     OFFICE VISIT   VITAL SIGNS:  Blood pressure is 160/90, respirations 24, pulse rate 76, and  he is afebrile.  His capillary blood glucose is 150 mg percent.   PURPOSE OF TODAY'S VISIT:  Mr. Hanigan returns for followup of a healed diabetic  foot ulcer.  In the interim, he has noted some blueness in his foot,  especially in the dependent position.  He denies drainage, malodor, or  significant pain.   WOUND EXAM:  Inspect of the left foot shows that there is no open wound on  the volar aspect of the previously located Wagner grade 3 ulcer.  There is  complete healing.  Inspection of his custom shoes shows that there appears  to be still less than optimal offloading of the 4th met head of the left  foot.  This appears to have some undue pressure.   DIAGNOSIS:  Healed wound.   MANAGEMENT PLAN & GOAL:  We are referring the patient to the orthotist to  have the custom insert modified to offload the 4th met head.  We will  reevaluate the patient following this adjustment to make sure he is  adequately offloaded and that he would not experience any complication  thereof.  We have explained this approach to the patient and his wife.  They  seem to understand and agree to be seen by [employer].  We will reevaluate them  following their adjustments.           ______________________________  Theresia Majors. Tanda Rockers, M.D.     [name]  D:  01/06/2006  T:  01/07/2006  Job:  [id number]

## 2010-07-20 NOTE — Assessment & Plan Note (Signed)
[facility]   NAME:  [name], [name]                  ACCOUNT NO.:  [id number]   MEDICAL RECORD NO.:  [id number]      DATE OF BIRTH:  1929-08-09   PHYSICIAN:  Theresia Majors. Tanda Rockers, M.D. VISIT DATE:  03/03/2006                                   OFFICE VISIT   VITAL SIGNS:  Blood pressure is 140/80, respirations 18, pulse rate 72,  his temperature is 97.7.   PURPOSE OF TODAY'S VISIT:  Mr. Lafever is a 77 year old man who is being  followed in the Wound Center for multiple ulcerations of his lower  extremities associated with his insensate diabetic feet.  His wife noted  that he had new ulcerations on his toes 3 days ago, made an immediate  appointment with the Wound Center and he has presented this morning for  evaluation.  He has had no interim fever, no excessive malodorous  drainage, and he reports no pain.   WOUND EXAM:  Inspection of his lower extremity shows that there is trace  edema on the right lower extremity.  On the dorsal aspect of the right  second toe there is a clean granulating wound which has improved since  his last visit.  There is no exudate and there is no malodor.  On the  left foot on the plantar aspect, there is a new ulceration consistent  with pressure.  The wound is oval in configuration and extends into the  subcutaneous area.  There is no excessive malodor, there is no  hyperemia, there is no drainage.  There is a new area, wound #5, on the  left medial ankle which has a dermatitic appearance with desquamation  and hyperemia but no active hemorrhage or malodor.   DIAGNOSIS:  Patient has recurrent neuropathic diabetic foot ulcers,  Wagner grade 2 on wounds #4 and #1.  Wound #5 is a new dermatitic lesion  which we have cultured, and we will rule out the concurrent presence of  fungi.   MANAGEMENT PLAN & GOAL:  We have advised the patient and have noticed  the nursing facility to discontinue the use of his custom shoes.  We are  placing him in  healing sandals with fashioned transverse felt strips to  offload the toes and to prevent further injury.  We will reevaluate him  at a weekly interval to make sure that offloading is effective and that  there are no areas of infection.  We have explained this approach to the  patient and his wife in terms that they seem to understand.  They have  been given an opportunity to ask questions.  They appreciate gratitude  for having been seen in the clinic and indicate that they will be  compliant with the above.           ______________________________  Theresia Majors. Tanda Rockers, M.D.     [name]  D:  03/03/2006  T:  03/04/2006  Job:  [id number]   cc:   [facility]

## 2010-07-20 NOTE — Assessment & Plan Note (Signed)
[facility]   [name], [name]                  ACCOUNT NO.:  [id number]   MEDICAL RECORD NO.:  [id number]      DATE OF BIRTH:  03-Oct-1929   PHYSICIAN:  Theresia Majors. Tanda Rockers, M.D. VISIT DATE:  04/14/2006                                   OFFICE VISIT   SUBJECTIVE:  The patient is a 77 year old man who we followed for a  Wagner grade-2 diabetic foot ulcer involving the plantar surface of his  left foot.  He has in the interim developed an abrasive injury to the  anterior shin of his right lower extremity.  During the interim, he  denies excessive drainage, malodor, pain, or fever.   OBJECTIVE:  Blood pressure is 175/94, respirations 18, pulse rate 85,  temperature 97.9.  Inspection of the lower extremity shows that on the  right there are fresh, full-thickness wounds to the anterior shin.  These were photographed, measured, and entered into the wound expert.  On the left plantar surface, the ulcer is clean, 100% granulated with  evidence of decrease in diameter.   ASSESSMENT:  Clinical improvement of Wagner grade-2 wound, and a new  stasis ulcer most likely related to trauma involving the right lower  extremity.   PLAN:  We will place the patient in a Pro-for wrap for his right lower  extremity, and we will continue antiseptic soap washes and offloading of  the left foot.  We will re-evaluate the patient in one week.           ______________________________  Theresia Majors. Tanda Rockers, M.D.     [name]  D:  04/14/2006  T:  04/15/2006  Job:  [id number]

## 2010-07-20 NOTE — Assessment & Plan Note (Signed)
[facility]   NAME:  [name], [name]                  ACCOUNT NO.:  [id number]   MEDICAL RECORD NO.:  [id number]      DATE OF BIRTH:  November 06, 1929   PHYSICIAN:  Theresia Majors. Tanda Rockers, M.D. VISIT DATE:  11/27/2005                                     OFFICE VISIT   SUBJECTIVE:  [name] returns for follow up of a Wagner grade 3 ulceration  involving the lateral aspect of the left foot.  The patient has undergone a  course of hyperbaric oxygen treatment and Apligraf.  He has been placed in  an offloading CAM Walker.  He denies pain, fever of swelling.   OBJECTIVE:  VITAL SIGNS:  Blood pressure is 150/90, respirations 26, pulse  rate 98 and he is afebrile.  Capillary blood glucose is 120 mg%.  EXTREMITIES:  Inspection of the wound shows that there has been significant  constriction.  The wound is of a circular configuration with a maximum  diameter 1 cm.  There is scant depth of approximately a millimeter.  Thee is  no necrosis.  There is a healthy-appearing granulating bed.   ASSESSMENT:  Improved wound.   PLAN:  1. We will continue offloading with the CAM Walker.  2. We reevaluate the patient in 10 days as needed.           ______________________________  Theresia Majors Tanda Rockers, M.D.     [name]  D:  11/27/2005  T:  11/28/2005  Job:  [id number]

## 2010-07-20 NOTE — Assessment & Plan Note (Signed)
[facility]   [name], [name]                  ACCOUNT NO.:  [id number]   MEDICAL RECORD NO.:  [id number]      DATE OF BIRTH:  April 06, 1929   PHYSICIAN:  Jake Shark A. Tanda Rockers, M.D. VISIT DATE:  07/08/2006                                   OFFICE VISIT   SUBJECTIVE:  Mr. Bry is a 77 year old man who we have followed for a  Wagner grade 2 diabetic foot ulcer involving the left volar foot.  In  the interim, he has developed a new wound on the right second toe.  He  denies interim fever, drainage, or malodor.   OBJECTIVE:  His blood pressure is 177/93, respirations are 20, pulse  rate 81, temperature 98.1.  Capillary glucose is 125 mg%.  Inspection of  the lower extremity shows that there is trace edema.  Wound #1 on the  plantar aspect of the left foot was debrided without difficulty  utilizing a 10 blade.  The hemorrhage was controlled with silver  nitrate.  The new wound, #7, is over the distal interphalangeal joint of  the second toe.  This has a crusted exudate, but there is no evidence of  deep seated infection or ascending cellulitis or lymphangitis.  There is  no critical ischemia.  The dorsalis pedis pulses are readily palpable  bilaterally.   IMPRESSION:  Wagner grade 2 diabetic foot ulcers.   PLAN:  The patient has in his possession a recently modified diabetic  foot shoe inserts.  We will begin wearing the inserts as prescribed 15  minutes at a time with frequent inspection of the foot to rule out  injury.  We will reevaluate him in 1 week to assess the continued  offloading or the adequacy of offloading of his custom orthotics.  We  have explained this approach to the wife and the patient in terms that  they both seem to understand.  They indicate that they will be compliant  as per above.      Harold A. Tanda Rockers, M.D.  Electronically Signed     HAN/MEDQ  D:  07/08/2006  T:  07/08/2006  Job:  [id number]

## 2010-07-20 NOTE — Assessment & Plan Note (Signed)
[facility]   NAMEAADYN, [name]                  ACCOUNT NO.:  [id number]   MEDICAL RECORD NO.:  [id number]      DATE OF BIRTH:  [birth year]   PHYSICIAN:  Theresia Majors. Tanda Rockers, M.D. VISIT DATE:  09/06/2005                                     OFFICE VISIT   VITAL SIGNS:  Blood pressure is 120/60, respirations 30, and he is afebrile.  Blood glucose was 139 mg%.   PURPOSE OF TODAY'S VISIT:  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  He has previously undergone a  transmetatarsal amputation of the fifth digit of the left foot.  This ulcer  has been treated with prolonged intravenous antibiotics using a PICC line.  We have seen him on occasion for debridement in the Wound Center.  During  the interim, he reports that there is persistent drainage with soilage of  his sock.  There is no particular malodor.  There has been no pain radiating  up the leg.  His blood sugars have been under reasonable control.   WOUND EXAM:  Inspection of the left foot shows that there is 1 to 2+ edema  with a volar ulcer on a prominence of the remnant of the shaft of the fifth  metatarsal.  The ulceration has necrotic tissue on the periphery and in its  center.  This was full-thickness debrided, with hemorrhage controlled with  cautery.  A Q-tip passed directly onto the periosteum, and this was  cultured.   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   TREATMENT:   ANESTHETIC USED:   TISSUE DEBRIDED:   LEVEL:   CHANGE IN MEDS:   COMPRESSION BANDAGE:   OTHER:   MANAGEMENT PLAN & GOAL:  We are proceeding with the collection of the  __________  to determine the patient's acceptability for hyperbaric oxygen  therapy.  If there are no contraindications, we will offer this patient HBO  therapy as soon as a spot is available.     ______________________________  Theresia Majors. Tanda Rockers, M.D.     [name]  D:  09/06/2005  T:  09/06/2005  Job:  [id number]

## 2010-07-20 NOTE — Assessment & Plan Note (Signed)
[facility]   NAME:  RONDAL, VANDEVELDE                  ACCOUNT NO.:  [id number]   MEDICAL RECORD NO.:  [id number]            DATE OF BIRTH:   PHYSICIAN:  Maxwell Caul, M.D. VISIT DATE:  12/16/2005                                     OFFICE VISIT   PURPOSE OF TODAY'S VISIT:  Followup of wound on his left foot that was  originally a Wagner's grade III.  He is status post HBO, also status post  Apligraf treatment  approximately 5 weeks ago.   WOUND EXAM:  This wound has completely resolved.   MANAGEMENT PLANS AND GOALS:  He can now go and have his final diabetic shoe  fitted.  He has been in a cam walker, which allowed complete resolution of  his wound; however, I have given him clearance to go back to his diabetic  foot wear.  His family and wife is much aware of what to look for, which  would necessitate for referral back here and I have discharged him at this  point with complete resolution of what was and originally Wagner's grade  III.  This is all gratifying and the patient and family expressed their  satisfaction.           ______________________________  Maxwell Caul, M.D.     MGR/MEDQ  D:  12/16/2005  T:  12/16/2005  Job:  [id number]

## 2010-07-20 NOTE — H&P (Signed)
NAME:  [name], [name]                            ACCOUNT NO.:  [id number]   MEDICAL RECORD NO.:  [id number]                   PATIENT TYPE:  INP   LOCATION:  0343                                 FACILITY:  [facility]   PHYSICIAN:  Colleen Can. Deborah Chalk, M.D.            DATE OF BIRTH:  June 29, 1929   DATE OF ADMISSION:  05/17/2003  DATE OF DISCHARGE:                                HISTORY & PHYSICAL   CHIEF COMPLAINT:  Chest pain and weakness.   HISTORY OF PRESENT ILLNESS:  The patient is a 77 year old white male who has  multiple medical problems.  He presents to the emergency room department at  [facility] today on May 17, 2003, with multiple somatic  complaints.  He has a known history of essential hypertension with excessive  alcohol consumption.   He presents to the emergency room department today with a several-day  history of chest pain, poor p.o. intake, as well as multiple episodes of  vomiting.  He states that he is basically throwing up green liquid.  However, he is able to continue to consume a significant amount of alcohol.  He drinks one fifth of moonshine each day.  His wife, who accompanies him to  the emergency room, notes that he is basically unable to walk.  He has had  significant falls.  He requires excessive assistance in order to remain  mobile.  He had significant swelling of his lower extremities yesterday and  called the office, yet he refused evaluation and stated that he was just too  weak to come into the office.  He was given Lasix with some improvement.   Here in the emergency room, he is no longer having chest pain.  He has a  fever of 102 degrees.  He is also noted to have an elevated troponin level,  however, with a negative CK-MB.  His EKG is satisfactory.  He is  subsequently admitted for medical management.   PAST MEDICAL HISTORY:  1. Hypertensive heart disease.  2. Past history of angina.  He has had previous Cardiolite in the past.  3.  Ongoing alcohol use.  4. Past tobacco use.  5. Peripheral neuropathy.  6. History of falls.  7. Wound infection of the right elbow.  8. History of urinary retention.  9. PVD with previous amputation of the toes.  10.      History of gout.  11.      Diabetes.  12.      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  13.      Previous staphylococcal infection of the spine.  14.      Multiple back surgeries.  15.      History of cholecystectomy.   ALLERGIES:  ACTOS.   CURRENT MEDICATIONS:  1. Bethanechol 10 mg t.i.d.  2. Trental 400 mg t.i.d.  3. Allopurinol 150 mg a day.  4. Lopressor 50 b.i.d.  5. Diazide 37.5/25 mg daily.  6. Glyburide 5 mg a day.  7. Nitroglycerin p.r.n.  8. Tranxene p.r.n.  9. Imdur 60 mg a day.  10.      Norvasc 5 mg a day.  11.      Potassium 8 mEq t.i.d.  12.      Quinine sulfate p.r.n.  13.      Neurontin 800 mg q.i.d.  14.      Aleve p.r.n.  15.      Humibid p.r.n.  16.      Phazyme p.r.n.  17.      Celebrex 200 mg every day.  18.      Aerobid inhaler twice a day.  19.      Tylenol No. 4 twice a day.  20.      Prevacid 30 mg a day.   FAMILY HISTORY:  Positive for hypertension and cancer.   SOCIAL HISTORY:  He is married.  He has significant alcohol consumption.  He  currently denies tobacco use.   REVIEW OF SYSTEMS:  He has had poor p.o. intake.  He has had fever over the  last few days.  He has recently finished a round of antibiotics for his  wound infection.  He has continued to have significant episodes of falling.  He basically is unable to walk.  He has had episodes of diarrhea.   Otherwise the review of systems is as noted above.   PHYSICAL EXAMINATION:  GENERAL APPEARANCE:  He is in no acute distress.  He  is conversive and able to answer questions appropriately.  VITAL SIGNS:  Temperature 102 degrees, blood pressure 109/77, heart rate  110, respirations 20.  HEENT:  He has a resolving bruise over the right orbit.  SKIN:  Warm and dry.   Color is basically unremarkable.  NECK:  Previous history of operative scaring.  LUNGS:  Basically clear.  SPINE:  Multiple scars on the posterior aspect.  CARDIAC:  Tachycardic rhythm.  There is no murmur.  ABDOMEN:  Soft.  Positive bowel sounds.  Nontender.  EXTREMITIES:  No edema.  He does have deformities of the feet with previous  amputation, as well as hammer toe.   PERTINENT LABORATORY DATA:  The troponin is 0.20.  The CK-MB is negative.  The hematocrit is 39, the white count is 5000, and the platelet count is  105,000.   Pertinent chemistries show a potassium of 2.9.  The glucose is 230.   LFTs are elevated with an SGOT of 107, SGPT 49, and total bilirubin 2.6.  His EKG shows sinus tachycardia with no acute changes.  His chest x-ray is  currently pending.   OVERALL IMPRESSION:  1. Atypical chest pain, doubtful for myocardial infarction.  2. Fever.  3. Wound infection of the right elbow.  4. Significant alcohol abuse.  5. Increased liver function tests.  6. Hypokalemia questionably related to previous diuretic use.  7. Diabetes.  8. Hypertension.  9. Falls.   PLAN:  He will be admitted to the service of Cassell Clement, M.D.  Most of  his home medicines will be continued, except will hold his diuretic therapy  for now.  Complete cultures will be obtained.  Will go ahead and start him  on vancomycin per pharmacy protocol.  He will need to be watched for alcohol  withdrawal and will be treated with Librium.  Further management to occur  per Dr. Yevonne Pax discretion.      Sharlee Blew,  N.P.                     Colleen Can. Deborah Chalk, M.D.    LC/MEDQ  D:  05/17/2003  T:  05/19/2003  Job:  [id number]

## 2010-07-20 NOTE — Assessment & Plan Note (Signed)
[facility]   NAME:  [name], [name]                  ACCOUNT NO.:  [id number]   MEDICAL RECORD NO.:  [id number]      DATE OF BIRTH:  [birth year]   PHYSICIAN:  Theresia Majors. Tanda Rockers, M.D. VISIT DATE:  11/05/2005                                     OFFICE VISIT   SUBJECTIVE:  [name] is a 77 year old man with a Wagner grade 3 ulceration  on the volar aspect of his left foot.  The patient has been treated with  hyperbaric oxygen.  He is currently in his fourth treatment for total number  of treatments of 10.  He has completed a previous 30 dives.  The wound shows  a complete granulated base and we have recommended that we proceed with an  Apligraf.  In the interim, he denies increasing fever or increasing  drainage.   VITAL SIGNS:  Blood pressure is 180/96, respiratory rate 16, pulse rate 70,  he is afebrile.  Capillary blood glucose 208 mg%.   PURPOSE OF TODAY'S VISIT:   WOUND EXAM:  Inspection of the left foot shows that the wound is clean.   WOUND SINCE LAST VISIT:   CHANGE IN INTERVAL MEDICAL HISTORY:   DIAGNOSIS:   TREATMENT:  It was copiously irrigated with saline and mechanically debrided  with a 4 x 4 cotton sponge.  Thereafter an Apligraf was placed using a  fenestrated technique without difficulty.  A Mepitel dressing was used to  hold the Apligraf in place and appropriate light dressing was applied.  The  patient was returned to a cam walker for off loading protection.   ANESTHETIC USED:   TISSUE DEBRIDED:   LEVEL:   CHANGE IN MEDS:   COMPRESSION BANDAGE:   OTHER:   IMPRESSION:  Adequate applied Apligraf.   MANAGEMENT PLAN & GOAL:  We will continue the HBO treatments.  We will  change the dressing in one week and evaluate the wound to assess the  Apligraf effect in 14 days.           ______________________________  Theresia Majors Tanda Rockers, M.D.     [name]  D:  11/05/2005  T:  11/05/2005  Job:  [id number]

## 2010-07-20 NOTE — Assessment & Plan Note (Signed)
[facility]   NAMECHATHAM, HOWINGTON                  ACCOUNT NO.:  [id number]   MEDICAL RECORD NO.:  [id number]      DATE OF BIRTH:  20-Jun-1929   PHYSICIAN:  Theresia Majors. Tanda Rockers, M.D. VISIT DATE:  02/17/2006                                   OFFICE VISIT   VITAL SIGNS:  Blood pressure is 130/87, respirations 20, pulse rate 76,  and he is afebrile.   PURPOSE OF TODAY'S VISIT:  Mr. Klutts is a 77 year old man whom we follow  for a neuropathic ulcer, classified as a Wagner's grade 2.  We have  referred him to the orthotist for interim adjustment of his footwear to  avoid pressure ulcers on the dorsum of the toes of the right foot.  He  has had that adjustment made and he returns for followup.  In the  interim he denies fever, pain or malodorous drainage.   WOUND EXAM:  Inspection of the right foot, specifically the dorsum of  the second toe, shows that the callused area has receded.  There is no  inflammation and there is no drainage.  There remains a punctate wound,  which was photographed and entered into the wound expert, but there is  no evidence of acute inflammation or infection.  Inspection of the shoe  shows that there is an enlarged area in the toe box, specifically on the  dorsum of the foot to allow for a better fit.   WOUND SINCE LAST VISIT:   CHANGE IN INTERVAL MEDICAL HISTORY:   DIAGNOSIS:  Adequate removal of the noxious footwear via modification.  No new ulcerations.   TREATMENT:   ANESTHETIC USED:   TISSUE DEBRIDED:   LEVEL:   CHANGE IN MEDS:   COMPRESSION BANDAGE:   OTHER:   MANAGEMENT PLAN & GOAL:  We are discharging the patient from the wound  center with instructions that his feet be inspected on a daily basis at  his facility.  At the first sign of any injury, he is to discontinue use  of the  footwear and he should be reevaluated by the orthotist for modification  of the footwear.  We have explained this approach to the patient and  his  wife in terms that they seem to understand.  We reassured them that we  are willing to reevaluate them in the wound center as needed.  The  patient is discharged.           ______________________________  Theresia Majors Tanda Rockers, M.D.     [name]  D:  02/17/2006  T:  02/18/2006  Job:  [id number]

## 2010-07-20 NOTE — Assessment & Plan Note (Signed)
[facility]   [name], [name]                  ACCOUNT NO.:  [id number]   MEDICAL RECORD NO.:  [id number]      DATE OF BIRTH:  1929-09-10   PHYSICIAN:  Theresia Majors. Tanda Rockers, M.D.      VISIT DATE:                                     OFFICE VISIT   SUBJECTIVE:  [name] is a 76 year old man who has undergone successful  treatment of a plantar ulcer on his left foot.  He returns complaining of a  recurrent ulceration on his 3rd toe of the left foot, on the dorsal aspect  of the toe.  In the interim, he has been fitted for custom shoes and  inserts.   OBJECTIVE:  Blood pressure is 157/80, respirations 16, pulse rate 67, and he  is afebrile.  Capillary blood glucose is 106 mg per cent.  Inspection of the  left lower extremity shows that there is a Wagner grade 1 ulceration over  the proximal interphalangeal joint dorsally of the 3rd digit.  There is no  exudate.  There is no associated cellulitis.   IMPRESSION:  Pressure ulceration due to relative diabetic neuropathy.   PLAN:  We are referring the patient immediately to [facility] for modification  of his shoe to relieve pressure.  In the interim, he is to have a padded  dressing and to resume wear of the Darco healing sandal with felt strips to  offload the 1st met head.  We will re evaluate the patient in 1 week.  Hopefully, he will have an adequate readjustment of his shoe to relieve this  pressure.  We have explained this approach to the patient and his wife in  terms that they understand.  We have complimented them for their early  intervention.           ______________________________  Theresia Majors Tanda Rockers, M.D.     [name]  D:  01/16/2006  T:  01/16/2006  Job:  [id number]   cc:   Maxwell Caul, M.D.

## 2010-07-20 NOTE — Assessment & Plan Note (Signed)
[facility]   [name], [name]                  ACCOUNT NO.:  [id number]   MEDICAL RECORD NO.:  [id number]      DATE OF BIRTH:  04-08-1929   PHYSICIAN:  Theresia Majors. Tanda Rockers, M.D. VISIT DATE:  03/10/2006                                   OFFICE VISIT   VITAL SIGNS:  Blood pressure is 167/73, respirations 20, pulse rate 78,  temperature 97.9.   PURPOSE OF TODAY'S VISIT:  [name] is a 77 year old man whom we follow  for diabetic foot ulcers on both feet.  Most recently he has had a  recurrent ulcer on the left plantar surface related to inadequate off  loading.  He also had an area of dermatitis on the medial aspect of the  left ankle.  In the interim, he reports that there has been some  malodorous drainage but there has been no fever and no swelling and no  erythema.   WOUND EXAM:  Inspection of the lower extremity shows that there is  bilateral 1+ edema.  The right second toe wound #4 has completely  resolved.  Wound #1 on the left plantar surface is completely  granulated.  There is no excessive drainage and no malodor.  The wound  was photographed and entered into the files.  Wound #5 on the ankle has  an erythematous, scaly rash with some weeping, but no definite  penetrating ulceration.   WOUND SINCE LAST VISIT:   CHANGE IN INTERVAL MEDICAL HISTORY:   DIAGNOSIS:  Resolved wound #4, stable wound #1, and wound #5 shows  evidence of dermatitic features.   TREATMENT:   ANESTHETIC USED:   TISSUE DEBRIDED:   LEVEL:   CHANGE IN MEDS:   COMPRESSION BANDAGE:   OTHER:   MANAGEMENT PLAN & GOAL:  We will continue the off loading healing sandal  for wound #1.  We will apply topical Protopic ointment to the medial  ulcer on the left malleolus and continue the local wound care.  We will  reevaluate the patient in one week.  The most recent culture showed a  Pseudomonas aeruginosa, but there is no evidence of active infection.  We will not specifically  add an antibiotic for this wound.  Rather we  will institute local  care to avoid moisture and use topical antiseptic techniques.  We have  explained this to the patient and his wife in terms that they seem to  understand.  We will reevaluate the patient in one week and change the  treatment possibly to include a CAM walker for more effective off  loading if there is no demonstrable improvement in the wound.           ______________________________  Theresia Majors Tanda Rockers, M.D.     [name]  D:  03/10/2006  T:  03/11/2006  Job:  [id number]

## 2010-07-20 NOTE — Assessment & Plan Note (Signed)
[facility]   NAME:  [name], [name]                  ACCOUNT NO.:  [id number]   MEDICAL RECORD NO.:  [id number]      DATE OF BIRTH:  [birth year]   PHYSICIAN:  Theresia Majors. Tanda Rockers, M.D. VISIT DATE:  11/19/2005                                     OFFICE VISIT   VITAL SIGNS:  Blood pressure 135/82, respirations 20, pulse rate 72.  He is  afebrile.  His capillary blood glucose is 236 mg%.   PURPOSE OF TODAY'S VISIT:  [name] is a 77 year old man who underwent a VLU  A Apligraf placement onto the left foot 2 weeks ago.  He returns for removal  of the dressing and inspection of the tape.   WOUND EXAM:  After removing the cam walker, the Apligraf take appears to  have covered the complete wound.  There is no evidence of bone exposure.  Granulation is present throughout the wound.   WOUND SINCE LAST VISIT:   CHANGE IN INTERVAL MEDICAL HISTORY:  In the interim, he denies fever, pain  or excessive swelling.   DIAGNOSIS:  Improvement of the wound status post Apligraf.   TREATMENT:  Mild blotting was performed at the edges but not directly onto  the wound itself.   ANESTHETIC USED:  None.   TISSUE DEBRIDED:  None.   LEVEL:   CHANGE IN MEDS:   COMPRESSION BANDAGE:   OTHER:   MANAGEMENT PLAN & GOAL:  We placed the patient in Hydrogel a SofSorb and re-  placed him in the cam walker.  We will reevaluate him in one week.           ______________________________  Theresia Majors. Tanda Rockers, M.D.     [name]  D:  11/19/2005  T:  11/20/2005  Job:  [id number]

## 2010-07-20 NOTE — Assessment & Plan Note (Signed)
[facility]   NAME:  ELLERY, MERONEY                  ACCOUNT NO.:  [id number]   MEDICAL RECORD NO.:  [id number]           DATE OF BIRTH:   PHYSICIAN:  Maxwell Caul, M.D. VISIT DATE:  10/18/2005                                     OFFICE VISIT   VITAL SIGNS:   PURPOSE OF TODAY'S VISIT:  Mr. Iodice was seen in conjunction with his  hyperbaric oxygen treatment today.  In the interim he feels that his wound  is improving, this seems to be also the opinion of his family.  He remains  on Doxycycline and Septra.   WOUND EXAM:  The wound on the left lateral foot has actually done very  nicely.  It has good granulation.  The central area which had been the most  penetrating part of the wound appears to be closing as well.  There was no  particular debridement that was required today.  The wound dimensions were  reviewed with the staff and appeared to be remarkably improving.  As  mentioned, there is good granulation.  The dorsalis pedis pulse is palpable.  There does not appear to be excessive amount of pain or discharge and no  evidence of infection.   IMPRESSION:  Improved Wagner grade III diabetic ulceration.  His hyperbaric  oxygen treatment is continuing and he will require reevaluation in one week.  All of this seems much improved.   WOUND SINCE LAST VISIT:   CHANGE IN INTERVAL MEDICAL HISTORY:   DIAGNOSIS:   TREATMENT:   ANESTHETIC USED:   TISSUE DEBRIDED:   LEVEL:   CHANGE IN MEDS:   COMPRESSION BANDAGE:   OTHER:   MANAGEMENT PLAN & GOAL:      Maxwell Caul, M.D.  Electronically Signed     MGR/MEDQ  D:  10/18/2005  T:  10/18/2005  Job:  [id number]

## 2010-07-20 NOTE — Discharge Summary (Signed)
NAME:  [name], [name]                            ACCOUNT NO.:  [id number]   MEDICAL RECORD NO.:  [id number]                   PATIENT TYPE:  INP   LOCATION:  0343                                 FACILITY:  [facility]   PHYSICIAN:  Cassell Clement, M.D.              DATE OF BIRTH:  01/21/1930   DATE OF ADMISSION:  05/17/2003  DATE OF DISCHARGE:  05/24/2003                                 DISCHARGE SUMMARY   FINAL DIAGNOSES:  1. Chest pain.  2. Ongoing alcohol abuse.  3. Cellulitis of arm secondary to accident at home.  4. Diabetes with diabetic neuropathy, type 2 non-insulin-dependent diabetes.  5. Hypertensive cardiovascular disease.  6. Hypokalemia.  7. Abnormal liver function studies.  8. Open wound of elbow, complicated.   OPERATIONS PERFORMED:  None.   HISTORY:  This is a 77 year old gentleman admitted as an emergency by Dr.  Deborah Chalk because of chest pain.  He has multiple medical problems and  presented with a 2-day history of chest pain.  The chest was tender to  touch.  He is also experiencing some peripheral edema.  He had called our  office the previous day with the complaint of edema but refused to come in  for an office visit.  He had a temperature of 102 and noted to have  increased troponin in the emergency room on admission here.   PAST MEDICAL HISTORY:  Positive for hypertension, angina, alcohol abuse,  tobacco abuse, neuropathy, frequent falls, wound infection, urinary  retention, peripheral vascular disease, gout, diabetes mellitus,  macrocytosis, prior staph infection of the spine, and status post  cholecystectomy and status post multiple back operations.  His orthopedist  is Dr. Otelia Sergeant.  The physical exam on admission showed vital signs with a  temperature of 102, blood pressure 109/77, heart rate of 110.  Chest is  clear.  The heart reveals sinus tachycardia.  Abdomen is nontender.  Extremities show no pedal edema of the feet.  He does have a recent  penetrating  wound of the right elbow.   Initial labs included troponin of 0.20, CK-MB was negative.  EKG showed  sinus tachycardia.  SGOT 107, SGPT 49, total bilirubin 2.6, blood sugars  230, potassium 2.9, sodium 132, BUN 10, creatinine 1.1.   HOSPITAL COURSE:  The patient was admitted to a telemetry bed.  Serial  antibiotics were obtained.  Because of his history of a long-term high-dose  alcohol intake, we watched him closely for evidence of DTs and alcohol  withdrawal.  He was given Librium and multivitamins and thiamine.  Because  of his wound infection, he was placed on IV vancomycin by pharmacy protocol.  By May 18, 2003 he was more stable.  He had had no further chest pain.  Rhythm was stable at normal sinus rhythm at 88 per minute and his  temperature had come down to 99 degrees on IV vancomycin.  His  urinalysis  was noted to be nitrite-positive.  I talked to the patient at length about  the importance of cessation of alcohol ingestion.  The patient indicated  that he did not have any intention of stopping drinking alcohol when he  returned home.  The right elbow laceration was followed closely and we asked  Dr. Otelia Sergeant to see the patient and make further recommendations regarding  wound care.  Metabolically, the patient remained stable and potassium  responded to oral potassium repletion.  Dr. Barbaraann Faster service saw the patient  and noted the deep laceration overlying the olecranon on the right felt to  be responding well to local care and antibiotics with mild surrounding  cellulitis.  They gave orders for packing with saline dressing while here  and continue that at home when he was discharged.  By March 18th, the  patient continued to complain of overall weakness but he was afebrile.  Potassium was up to 3.9.  Kidney function remained stable.  We requested  that the patient be ambulated with assistance and that he receive physical  therapy.  By March 19th, his antibiotic was changed to oral  antibiotics.  By  March 21st, he again spiked a temperature to 102 delaying his discharge.  The fever apparently was associated with difficulty voiding once the Foley  was out and the Foley actually had to be replaced therefore.  By March 22nd,  the patient was improved, had no further fever, urinalysis was negative  nitrite, lungs were clear, elbow was improving, and it was felt that the  patient was stable enough for a discharge home.  We had spoken to the  patient about possible nursing home placement but the patient was adamant  that he was going to go home.  His wife understood that if he failed the  attempt to stay at home the next step would have to be a nursing home since  she really is not able to care for him unless he is able to help himself  some.  We did order home health R.N., home health OT and PT, and an aide.  We reduced his Librium to 25 mg twice a day at discharge.  We continued Pen-  Vee K for an additional 10 days after discharge.  He is to see Dr. Patty Sermons  and also Dr. Otelia Sergeant in about 2 weeks.  Once again, we stressed the importance  of no alcohol.   LABORATORY STUDIES:  EKG showed sinus tachycardia and minimal voltage  criteria for LVH but no acute ischemic changes.  Chest x-ray showed no acute  abnormalities and minimal bibasilar atelectasis.  Hemoglobin on admission  was 13.6, on discharge 12.4.  White count was normal throughout the hospital  course.  MCV was elevated consistent with alcohol and his platelets were low  also consistent with alcohol.  Platelets ranged from 105,000 down to 79,000.  Although his total white count was normal, he had 78% polys and 11% lymphs.  Clotting studies were normal.  Potassium on admission 2.9, on discharge 4.2.  Blood sugar on admission 230, on discharge 193.  In the hospital, his liver  function studies improved reflecting the benefits of abstinence from alcohol.  His troponin-I was elevated at 0.20 and 0.18 and CK-MB's were   negative x3.  Initial urinalysis was positive nitrite and after antibiotic  therapy was negative nitrite.  Blood cultures were negative x2.  Initial  urine was no growth.  Culture of urine grew Pseudomonas aeruginosa greater  than 100,000 colonies and wound culture grew Enterococcus.   The patient was discharged improved on Norvasc 5 mg one daily, Imdur 60 mg  one daily, Urecholine 10 mg three times a day, Trental 400 mg t.i.d.,  allopurinol 300 mg 1/2 tablet daily, Lopressor 50 mg twice a day, glyburide  5 mg daily, Flovent or Aerobid 44 mcg two puffs twice a day, Protonix 40 mg  daily, Neurontin 800 mg four times a day, Therapeutic multivitamin once a  day, Librium 25 mg twice a day, K-Dur 20 mEq one four times a day,  hydrochlorothiazide 12.5 mg daily, Tylenol or Darvocet for pain, Pen-Vee K  500 mg four times a day for ten days, Nitrostat 1/150 gr. sublingually  p.r.n.  The patient is to walk with a walker and he will use a bedside  commode if necessary.  He will be on a diabetic diet, no free sweets, no  alcohol.  He is to apply Neosporin ointment and dry sterile dressing to the  right elbow daily.  He will be followed with a home health R.N., PT, OT and  aide.  He is to see Dr. Patty Sermons and Dr. Otelia Sergeant in two weeks.   CONDITION ON DISCHARGE:  Improved.                                               Cassell Clement, M.D.    TB/MEDQ  D:  06/14/2003  T:  06/14/2003  Job:  [id number]   cc:   Kerrin Champagne, M.D.  [address]  [place]  [place] [address]  Fax: [phone]

## 2010-07-20 NOTE — Discharge Summary (Signed)
NAME:  [name], [name]                            ACCOUNT NO.:  [id number]   MEDICAL RECORD NO.:  [id number]                   PATIENT TYPE:  INP   LOCATION:  5155                                 FACILITY:  [facility]   PHYSICIAN:  Lonia Blood, M.D.                   DATE OF BIRTH:  19-Apr-1929   DATE OF ADMISSION:  10/18/2003  DATE OF DISCHARGE:  11/01/2003                                 DISCHARGE SUMMARY   PRIMARY CARE PHYSICIAN:  The patient is unassigned to [place].   DISCHARGE DIAGNOSES:  1.  Chronic alcoholism and acute intoxication.  2.  Alcoholic psychosis.  3.  Hypertension.  4.  Diabetes, type 2.  5.  Hyponatremia.  6.  Thrombocytopenia.  7.  Bacterial urinary tract infection.  8.  Mild acidosis.  9.  Gout.  10. History of osteomyelitis.  11. Alcoholic liver disease.  12. Dysphagia.  13. Malnutrition.   DISCHARGE MEDICATIONS:  1.  Thiamine 100 mg p.o. daily.  2.  Folic acid 1 mg p.o. daily.  3.  Insulin sliding scale.  4.  Nicotine patch 21 mg in 24 hours daily.  5.  Prevacid 30 mg daily.  6.  Aldactone 25 mg daily.  7.  K-Dur 20 mEq daily.  8.  Lexapro 10 mg p.o. q.a.m.  9.  Lantus 4 units q.12 h.  10. Ensure with Fiber, vanilla, daily.  11. Augmentin 875 mg q.12 h. for 5 more days.   DIET:  Dysphagia I diet.   CONSULTATIONS:  1.  Gastroenterology, Dr. Petra Kuba.  2.  Psychiatry, Dr. Antonietta Breach.   PROCEDURES PERFORMED:  1.  CT of head without contrast performed on October 18, 2003 that showed      chronic ischemic changes and atrophy with no acute changes.  2.  Chest CT without contrast that shows no evidence like a pulmonary      embolism, also performed on October 22, 2003.  It showed mild posterior      bilateral lower lobe infiltrates versus atelectasis.  3.  Chest x-ray performed on October 22, 2003 that shows mildly improved left      basilar atelectasis and possible pneumonia with stable mild bronchitic      changes.  4.  Abdominal ultrasound  performed on October 20, 2003 that showed renal      cortical atrophy, mild splenomegaly, incomplete visualization of      pancreas and aorta, markedly echogenic liver with question of fatty      infiltration versus cirrhosis.   BRIEF HISTORY AND PHYSICAL:  Mr. Bergemann was admitted on October 18, 2003, having  been brought in by his wife with history of passing out and weakness.  Per  wife, the patient was unable to move anywhere.  He was previously being seen  by Dr. Cassell Clement, who was his cardiologist.  While arriving in the  hospital, the patient was found to be intoxicated with an alcohol level of  over 200.  He was also felt to be hypertensive with a low sodium and also  had low platelet count.  The patient was also unable to put weight on his  feet.   HOSPITAL COURSE:  PROBLEM #1 - ALCOHOL INTOXICATION:  The patient was placed  on alcohol intoxication and withdrawal protocol.  He was initially on the  Librium protocol and completed that; he was still confused and was placed on  Ativan withdrawal protocol.  He continued to remain confused and was  subsequently seen by psychiatry.  It was assumed that his alcohol  intoxication was just an acute on chronic event.  Probably, the patient is  having some psychosis secondary to alcoholism.  He was placed on thiamine  and folate at that point.  __________ at the time of discharge.   PROBLEM #2 - HYPONATREMIA:  This was found on admission, however, was  thought to be a combination of his liver problem as well as dehydration.  That was corrected also prior to his discharge.   PROBLEM #3 - THROMBOCYTOPENIA:  He had thrombocytopenia also secondary to  his alcoholism.   PROBLEM #4 - URINARY TRACT INFECTION:  The patient had a UTI, thus he was  started on ciprofloxacin initially and at the time of discharge, the patient  was discharged on Augmentin.   PROBLEM #5 - ASPIRATION PNEUMONIA:  The patient's subsequent chest x-ray  showed that  the patient Carrigan have aspirated while he was intoxicated, as he  had bilateral infiltrates, questionable pneumonia.  He was started on Zosyn  IV during hospitalization and discharged on oral Augmentin for a further 3  days.   PROBLEM #6 - CIRRHOSIS:  The patient obviously had elevated LFTs and  evidence of chronic alcoholism and per abdominal ultrasound, it seems like  patient had liver cirrhosis as well.  He was on lactulose, which was  discontinued secondary to patient's diarrhea.  The patient Degollado resume  lactulose in the nursing home when he is stable.   PROBLEM #7 - GASTROINTESTINAL BLEED:  While in the hospital, the patient had  positive blood in his stool.  He was seen by gastroenterology for that, as  well as possibly evaluation for PEG tube placement.  The patient did not get  any colonoscopy as the patient's wife insists that the patient does not want  any heroic measures and he is to continue with some form of comfort  measures.   PROBLEM #8 - HYPERTENSION:  The patient had high blood pressure on admission  and subsequently treated and while in the hospital, his blood pressure has  remained stable in the acceptable range.   PROBLEM #9 - FAILURE TO THRIVE:  With all the deteriorating medical  problems, the patient seemed to have had severe failure to thrive; this is  probably secondary to his alcoholism as well as his medical problems.  He  had previously been doing okay prior to all these episodes, according to  wife, but now wife says she is tired of taking care of him and he is not  taking care of himself.  He was seen by psychiatry and also was deemed not  able to take care of himself so subsequently, wife made a decision to put  him in a skilled nursing facility and we referred the patient's wife there.  He has been agitated occasionally, but he is currently on the right  medication per psychiatry.  He has suffered with depression since 1986, which Lavine be playing a role  into all this.   PROBLEM #10 - DIABETES:  There is a history of diabetes in the patient,  however, his blood sugar has remained adequately controlled during this  hospitalization.  He is currently on insulin and I will send him to a  nursing facility on the same insulin.                                                Lonia Blood, M.D.    Verlin Grills  D:  11/01/2003  T:  11/01/2003  Job:  [id number]

## 2010-07-20 NOTE — Assessment & Plan Note (Signed)
[facility]   NAME:  [name], [name]                  ACCOUNT NO.:  [id number]   MEDICAL RECORD NO.:  [id number]           DATE OF BIRTH:   PHYSICIAN:  Theresia Majors. Tanda Rockers, M.D. VISIT DATE:  07/26/2005                                     OFFICE VISIT   SUBJECTIVE:  Mr. [name] is a 77 year old man who is undergoing hyperbaric  oxygen treatment for a Wagner's 3 diabetic foot ulcer.  He has completed his  eighth dive.  We are reevaluating his wound.  In the interim he has had a  moderate amount of serous exudate with no particular malodor and no increase  in cellulitis or fever.   OBJECTIVE:  His vital signs are stable.  He is afebrile.  Inspection of the  wound shows that there is indeed a moderate amount of exudate, some which  has been thickened and adherent to the base of the wound.  The wound was  photographed, measured, and entered into wound expert.  Thereafter, the  wound was debrided full thickness with hemorrhage control with direct  pressure.  We have applied a layered portion of prisma colloidal dressing  for matrix dressing to provide for protection and antibiotic sterilization.   ASSESSMENT:  Response to hyperbaric oxygen treatment.   PLAN:  We will continue to his HBO treatment and reevaluate him in five  days.           ______________________________  Theresia Majors Tanda Rockers, M.D.     [name]  D:  09/25/2005  T:  09/25/2005  Job:  [id number]

## 2010-07-20 NOTE — Op Note (Signed)
   NAME:  [name], [name]                            ACCOUNT NO.:  [id number]   MEDICAL RECORD NO.:  [id number]                   PATIENT TYPE:  AMB   LOCATION:  ENDO                                 [facility]ACILITY[facility]  [facility]   PHYSICIAN:  Graylin Shiver, M.D.                DATE OF BIRTH:  Oct 03, 1929   DATE OF PROCEDURE:  09/21/2002  DATE OF DISCHARGE:                                 OPERATIVE REPORT   PROCEDURE:  Colonoscopy.   INDICATIONS FOR PROCEDURE:  A 77 year old male with constipation, weight  loss.  This is a screening exam to evaluate the colon.   CONSENT:  Informed consent was obtained after explanation of the risks of  bleeding, infection and perforation.   PREMEDICATION:  The procedure was done immediately after an EGD.  An  additional 12.5 mcg of fentanyl  and 1 mg of Versed were given.   DESCRIPTION OF PROCEDURE:  With the patient in the left lateral decubitus  position, the rectal exam was performed and no masses were felt.  The  Olympus colonoscope was inserted into the rectum and advanced around the  colon to the cecum.  Cecal landmarks were identified.  The cecum appeared  normal.  The ascending colon revealed three small diverticulum.  The  transverse colon looked normal.  There was a small 3 mm polyp in the  descending colon which was removed with cold forceps.  There were two small  3 mm polyps in the sigmoid removed with cold forceps.  The rectum looked  normal.  He tolerated the procedure well without complications.   IMPRESSION:  1. A few scattered diverticula in the ascending colon.  2. Small descending colon polyp.  3. Two small sigmoid polyps.   PLAN:  The pathology will be checked on the polyps.                                                Graylin Shiver, M.D.    Germain Osgood  D:  09/21/2002  T:  09/21/2002  Job:  [id number]   cc:   Cassell Clement, M.D.  [address], [address]  [place]  [place] [address]  Fax: [phone]

## 2010-07-20 NOTE — Op Note (Signed)
   NAME:  [name], [name]                            ACCOUNT NO.:  [id number]   MEDICAL RECORD NO.:  [id number]                   PATIENT TYPE:  AMB   LOCATION:  ENDO                                 FACILITY:  [facility]   PHYSICIAN:  Graylin Shiver, M.D.                DATE OF BIRTH:  1929-09-04   DATE OF PROCEDURE:  09/21/2002  DATE OF DISCHARGE:                                 OPERATIVE REPORT   PROCEDURE:  Esophagogastroduodenoscopy with biopsy for CLO-test.   INDICATIONS FOR PROCEDURE:  Abdominal pain and weight loss, nausea, rule out  upper GI abnormality.  Informed consent was obtained after explanation of  the risks of bleeding, infection, and perforation.   PREMEDICATIONS:  Fentanyl 50 mcg IV, Versed 5 mg IV.   DESCRIPTION OF PROCEDURE:  With the patient in the left lateral decubitus  position, the Olympus gastroscope was inserted into the oropharynx and  passed into the esophagus.  It was advanced down the esophagus and into the  stomach and into the duodenum.  The second portion and bulb of the duodenum  looked normal.  The stomach showed a diffuse, erythematous appearance to the  mucosa compatible with gastritis.  There were a few scattered erosions  present in the antrum.  Biopsy for CLO-test was obtained from the stomach.  No lesions were seen in either the fundus or cardia on retroflexion of the  gastroscope.  The esophagus looked normal in its entirety.  He tolerated the  procedure well without complications.   IMPRESSION:  Gastritis with scattered erosions at the antrum. This patient  does take Celebrex which contribute to the gastritis and presence of  erosions of the antrum.  He is also on Prevacid.  I would recommend that he  would remain no the Prevacid as long as he takes the Celebrex.  The biopsy  for CLO-test will be checked to look for any evidence of Helicobacter pylori  and if it is present, it will be treated.  It is possible that the patient's  nausea and  intermittent vomiting could be secondary to the presence of  gastritis with erosions.                                                  Graylin Shiver, M.D.    Germain Osgood  D:  09/21/2002  T:  09/21/2002  Job:  [id number]   cc:   Cassell Clement, M.D.  [address], [address]  [place]  [place] [address]  Fax: [phone]

## 2010-07-20 NOTE — Assessment & Plan Note (Signed)
[facility]   NAMEMarland Kitchen  [name], [name]                  ACCOUNT NO.:  [id number]   MEDICAL RECORD NO.:  [id number]      DATE OF BIRTH:  October 28, 1929   PHYSICIAN:  Maxwell Caul, M.D. VISIT DATE:  05/16/2006                                   OFFICE VISIT   VITAL SIGNS:   PURPOSE OF TODAY'S VISIT:  Followup of diabetic foot wound on the bottom  of the plantar aspect of his right foot and venous stasis ulcerations on  the legs.  To the left plantar wound we have been using antiseptic soap,  protective adhesive and a healing sandal.  On the predominant right leg  stasis ulcerations, he had been receiving a Profore.   WOUND EXAM:  The stasis wounds on the right leg have healed completely.  We have managed to obtain graded pressure stockings.  The left plantar  ulcer did not look quite as good as last time; however, inspecting his  healing sandal showed that the felt had moved and I suspect this was all  related to inadequate pressure relief.   WOUND SINCE LAST VISIT:   CHANGE IN INTERVAL MEDICAL HISTORY:   DIAGNOSIS:   TREATMENT:   ANESTHETIC USED:   TISSUE DEBRIDED:   LEVEL:   CHANGE IN MEDS:   COMPRESSION BANDAGE:   OTHER:   MANAGEMENT PLAN & GOAL:  No debridement was necessary to the left  plantar wound.  We have continued with the adhesive protective dressing  with felt offloading and a new healing sandal.  We have applied his own  graded pressure stockings, which the facility can take off at night.  We  will see him again in two weeks; however, I am hopeful that with the new  healing sandal and proper offloading we will continue to see improvement  in the plantar wound.           ______________________________  Maxwell Caul, M.D.     MGR/MEDQ  D:  05/16/2006  T:  05/17/2006  Job:  [id number]

## 2010-07-20 NOTE — Assessment & Plan Note (Signed)
[facility]   NAME:  [name], [name]                  ACCOUNT NO.:  [id number]   MEDICAL RECORD NO.:  [id number]            DATE OF BIRTH:   PHYSICIAN:  Theresia Majors. Tanda Rockers, M.D. VISIT DATE:  05/30/2006                                   OFFICE VISIT   VITAL SIGNS:  Blood pressure is 170/80, respirations 20, pulse rate 86,  temperature 97.8.   PURPOSE OF TODAY'S VISIT:  Mr. Gangi is a 77 year old man who we follow  for Wegner grade II ulceration over the volar aspect of his left lower  extremity.  The patient has been interimly treated with a modified  offloading healing sandal using felt strips.  There has been no interim  drainage, malodor, pain or fever.  He does not inspect his feet daily,  but his wife, who visits him in the nursing facility, watches the foot  on a daily basis.   WOUND EXAM:  Inspection of the left foot shows that there is trace  edema.  There is some desquamation, but there is no penetration of the  ulceration.  The desquamation was sharply debrided.  There was no  hemorrhage.  There appears to be 100% reepithelization.  There is no  evidence of infection.  Capillary refill is brisk.  There are faint  pedal pulses appreciated.  There is no evidence of ischemia.   DIAGNOSIS:  Clinical improvement.   MANAGEMENT PLAN & GOAL:  We will continue the patient in the modified  healing sandal.  We will see him in 2 weeks.  Hopefully, he will be  completely resolved and he will be able to return to his custom  orthotics.  The wife will continue to inspect his feet daily and if she  notices any breakdown, she will call for an interim appointment.      Harold A. Tanda Rockers, M.D.  Electronically Signed     HAN/MEDQ  D:  05/30/2006  T:  05/30/2006  Job:  [id number]

## 2010-07-20 NOTE — Assessment & Plan Note (Signed)
[facility]   NAMEALDEN, [name]                  ACCOUNT NO.:  [id number]   MEDICAL RECORD NO.:  [id number]      DATE OF BIRTH:  02-26-1930   PHYSICIAN:  Theresia Majors. Tanda Rockers, M.D.      VISIT DATE:                                   OFFICE VISIT   VITAL SIGNS:  Blood pressure is 140/80, respirations 20, pulse rate 80,  and he is afebrile.   PURPOSE OF TODAY'S VISIT:  [name] return for followup of a plantar  ulcer on the volar aspect of the left foot.  The patient has completed  HBO therapy and currently has custom shoes and orthotics.  He denies  pain, fever.  He is complaining of a new wound on his right lower  extremity.   WOUND EXAM:  Inspection of the feet shows that on the right there is a  Wagner grade 1 ulceration involving the second distal interphalangeal  joint dorsally.  There is a halo of erythema.  This wound has been  covered by DuoDerm pad.  Wounds #3 and #1 are completely resolved.   DIAGNOSIS:  New Wagner grade 1 ulceration of the second toe of the right  foot.   MANAGEMENT PLAN & GOAL:  We are referring the patient to [facility] to have  his custom shoe adjusted.  Most likely, all he needs is a bubble stretch  on the dorsum of the second toe.  In the interim, he will continue to  wear a thick sock and apply antiseptic soap.  We will reevaluate the  patient in 1 week to make sure that the orthotics has been adjusted and  that there is no extension of the ulcer.           ______________________________  Theresia Majors. Tanda Rockers, M.D.     [name]  D:  02/10/2006  T:  02/11/2006  Job:  [id number]

## 2010-07-20 NOTE — Assessment & Plan Note (Signed)
[facility]   NAMEJABAR, [name]                  ACCOUNT NO.:  [id number]   MEDICAL RECORD NO.:  [id number]      DATE OF BIRTH:  22-Dec-1929   PHYSICIAN:  Theresia Majors. Tanda Rockers, M.D.      VISIT DATE:                                     OFFICE VISIT   SUBJECTIVE:  [name] returns for a followup of a Wagner 3 ulceration of the  left lateral foot.  The patient is concurrently undergoing hyperbaric oxygen  treatment.  During the interim, he reports that there has been significantly  decreased drainage and decreased malodor.  He has had an interim culture and  remains on Doxycycline and Septra.   OBJECTIVE:  VITAL SIGNS:  Are stable.  He is afebrile.  EXTREMITIES:  Inspection of the foot shows that the ulcer has been  adequately off-loaded with the cam walker and the fiberglass.  His dressing  shows some evidence of saturation and continues to weep most likely a serous  drainage from deep penetration of the ulcer down into the joint.  A full  thickness debridement was achieved with rongeurs.  There was bleeding that  was controlled easily with digital pressure.  The wound is a circumferential  wound with a diameter of 2.6 cm.  The more superficial areas of the wound  have a depth of 0.3 cm.  There is a sinus tract at the center of the wound  which extends down to bone.  The dorsalis pedis pulse is readily palpable.  There was no pain with the debridement.  No specimens were forwarded.  The  last culture obtained on October 08, 2005 has been re-incubated.  They did  show a few gram-positive rods and a few positive cocci in pairs.   ASSESSMENT:  Improved wound plan.  We will continue his hyperbaric oxygen  treatment, and we will re-evaluate him with a wound assessment in one week.           ______________________________  Theresia Majors. Tanda Rockers, M.D.     [name]  D:  10/11/2005  T:  10/11/2005  Job:  [id number]

## 2010-07-20 NOTE — Assessment & Plan Note (Signed)
[facility]   NAMEMarland Kitchen  CRISTOFHER, LIVECCHI                  ACCOUNT NO.:  [id number]   MEDICAL RECORD NO.:  [id number]      DATE OF BIRTH:  November 19, 1929   PHYSICIAN:  Maxwell Caul, M.D.      VISIT DATE:                                   OFFICE VISIT   PURPOSE FOR TODAY'S VISIT:  Mr. Menz returns today in followup.  He is a  gentleman who we have been following for a Wagner's grade 2 ulceration  of the vulvar aspect of his left lower extremity.  He has been using a  healing sandal.  The last time he was here we felt that this was well on  the way to resolution.  He returns today in followup.   Unfortunately, his wife states that probably sometime over the last week  he has developed what looks to be a blood blister under the  aforementioned area.  There is no evidence that he is systemically ill.  There is no fever.  And no other drainage was noted.  He had continued  to use the healing sandal.   On examination, his temperature was 98.2.  Pulse 74.  Respirations 22.  Blood pressure 130/87.   WOUND EXAM:  There was indeed sanguinous drainage under a blister in the  exact same area that we have been treating previously.  This underwent a  full thickness debridement with a #15 blade.  We did not use anything  for anesthesia and hemostasis was with a silver nitrate stick and direct  pressure.  The blister was debrided down to a clean base.   IMPRESSION:  Recurrent ulcer Wagner's grade 2 wound.  Unfortunately he  has developed a blister here.  The reason for this is unclear.  It looks  as though his felt offloading healing sandals really should have been  doing a better job loading this area.  Nevertheless, we ahve rechecked  the sandals.  We will apply daily antiseptic soap washes.  Polysporin of  Theodora Blow and continue in the healing sandal.  Also unfortunately  we had written for him to return to his diabetic footwear on the right.  However, this developed a  pressure area in the top of his right second  toe which is a hammered deformity.  This appears healed by he is also  back in a healing sandal on the right.   He very well might need to be remeasured for better diabetic footwear.   We will see him again in a weeks time.           ______________________________  Maxwell Caul, M.D.     MGR/MEDQ  D:  06/13/2006  T:  06/13/2006  Job:  [phone] [id number]

## 2010-07-20 NOTE — Assessment & Plan Note (Signed)
[facility]   NAME:  [name], [name]                  ACCOUNT NO.:  [id number]   MEDICAL RECORD NO.:  [id number]      DATE OF BIRTH:  1929-06-13   PHYSICIAN:  Theresia Majors. Tanda Rockers, M.D. VISIT DATE:  08/09/2005                                     OFFICE VISIT   VITAL SIGNS:  Blood pressure 180/88, pulse rate 68, respiratory rate 20, he  is afebrile.   PURPOSE OF TODAY'S VISIT:  [name] returns for followup of an ulceration on  the volar aspect of his left foot. In the interim he has not had the  dressing changed but two times. He denies increased drainage, malodor or  pain.   WOUND EXAM:  There is 2+ edema associated bilaterally. There dorsalis pedis  is 2+ on the left foot. The ulcer itself shows an area of maceration. The  dimensions have decreased minimally.   WOUND SINCE LAST VISIT:  Slow healing most likely related to inadequate off  loading.   CHANGE IN INTERVAL MEDICAL HISTORY:  Not given.   DIAGNOSIS:  Not Given.   TREATMENT:  Not given.   ANESTHETIC USED:  Not given.   TISSUE DEBRIDED:  Not given.   LEVEL:  Not given.   CHANGE IN MEDS:  Not given.   COMPRESSION BANDAGE:  Not given.   OTHER:  The footwear is that of a healing sandle but there is no provision  for off loading.   MANAGEMENT PLAN & GOAL:  We placed a Felt strip in the bottom of the sandle  to provide off loading. We will institute daily antiseptic soak with a dry  dressing and topical neosporin. We will see the patient in two weeks. We  have indicated these change and orders on the accompanying records to be  returned to [facility].           ______________________________  Theresia Majors. Tanda Rockers, M.D.    [name]  D:  08/09/2005  T:  08/09/2005  Job:  [id number]

## 2010-07-20 NOTE — Assessment & Plan Note (Signed)
[facility]   NAME:  [name], [name]                  ACCOUNT NO.:  [id number]   MEDICAL RECORD NO.:  [id number]      DATE OF BIRTH:  30-Jan-1930   PHYSICIAN:  Theresia Majors. Tanda Rockers, M.D. VISIT DATE:  09/18/2005                                     OFFICE VISIT   VITAL SIGNS:  Stable; he is afebrile.   PURPOSE OF TODAY'S VISIT:  [name] is a 77 year old man with a Wagner 3  diabetic foot ulcer who is currently undergoing HBO therapy.  He is due for  his wound assessment following his third dive.   WOUND EXAM:  The inspection of the volar aspect of the left foot shows that  there is a persistence of the ulceration.  A Q-tip was used to probe down to  periosteum.  There are no loculations.  The hyperemia is significantly  receded.  Pedal pulses remain palpable.   DIAGNOSIS:  Improving wound.   MANAGEMENT PLAN & GOAL:  We will continue his HBO therapy.  We will  reevaluate the wound in 5 days. In the interim, we will fit him with a Darco  healing sandal and provide him with felt strips to off-load the head of the  metatarsal.           ______________________________  Theresia Majors. Tanda Rockers, M.D.     [name]  D:  09/18/2005  T:  09/18/2005  Job:  [id number]

## 2010-07-20 NOTE — Op Note (Signed)
NAME:  [name], [name]                            ACCOUNT NO.:  [id number]   MEDICAL RECORD NO.:  [id number]                   PATIENT TYPE:  OIB   LOCATION:  2899                                 FACILITY:  [facility]   PHYSICIAN:  Gita Kudo, M.D.              DATE OF BIRTH:  1930/02/23   DATE OF PROCEDURE:  11/30/2001  DATE OF DISCHARGE:                                 OPERATIVE REPORT   PREOPERATIVE DIAGNOSIS:  Left inguinal hernia, sliding.   POSTOPERATIVE DIAGNOSIS:  Left inguinal hernia, sliding.   PROCEDURE:  Large left indirect sliding inguinal hernia repair with Prolene  mesh plug and patch.   SURGEON:  Gita Kudo, M.D.   ANESTHESIA:  General.   CLINICAL HISTORY:  A 76 year old male with an enlarging bulge in his left  groin over at least five years.  Physical examination discloses an only  partially-reducible inguinal hernia.   OPERATIVE FINDINGS:  The patient had a large, incarcerated left inguinal  hernia that was sliding with colon as the abdominal component.   DESCRIPTION OF PROCEDURE:  Under satisfactory general endotracheal  anesthesia, having received 1.0 g Ancef preop, the patient's abdomen was  prepped and draped in a standard fashion.  A left lower abdominal transverse  incision was made and carried down to and through external oblique and  external ring.  The cord and its contents were mobilized and the large sac  freed all around up to the internal ring.  It was then opened and excess sac  trimmed away.  Then the sac was closed securely with 0 Prolene suture and  returned to the abdominal cavity.  The floor of the canal was weak with a  small defect consistent with a direct hernia.   The floor was then closed with a running 0 Prolene suture, taking wide bites  of the transversalis fascia.  After two bites, a 2 x 4 inch piece of Prolene  mesh was tailored into a plug and inserted with the suture.  Then the  closure was continued up to the internal  ring and the ring made snug and the  ends of the suture left long, and the plug was in good position at the  internal ring.  Following this the remaining mesh was tailored into an oval  to go around the cord structures.  It fit in the floor of the canal and a  slit was made to go at the internal ring, where the cord could exit.  It was  then anchored at the internal ring with the previously-placed suture and  tacked around the periphery with interrupted 0 Prolene to the inguinal  ligament below and the internal oblique above.  The tails were then brought  around the cord and sutured to each other in the fascia above and lateral to  the cord.  The wound was lavaged with saline, infiltrated with Marcaine,  and  closed in layers with running 2-0 Vicryl for external oblique, 2-0 Vicryl  for  deep fascia, 3-0 Vicryl for subcu, and Steri-Strips for skin.  Sterile  absorbent dressings were applied.  Sponge and needle counts correct.  No  complications.  The patient will be kept overnight for observation and  discharged in the morning.                                               Gita Kudo, M.D.    MRL/MEDQ  D:  11/30/2001  T:  11/30/2001  Job:  [id number]   cc:   Bryan Lemma. Manus Gunning, M.D.  [address] [place]  [place] [address]  Fax: [phone]   Clovis Pu. Patty Sermons, M.D.

## 2010-07-20 NOTE — Assessment & Plan Note (Signed)
[facility]   NAME:  SAMIR, ISHAQ                  ACCOUNT NO.:  [id number]   MEDICAL RECORD NO.:  [id number]      DATE OF BIRTH:  07-05-1929   PHYSICIAN:  Theresia Majors. Tanda Rockers, M.D.      VISIT DATE:                                     OFFICE VISIT   PURPOSE OF TODAY'S VISIT:  Mr. Milledge Tkach is a 77 year old man who is  currently a resident at the [facility]. His primary care physician  is Dr. Leanord Hawking. The patient is re-referred to the Wound Center for evaluation  of an ulceration of his left foot.   IMPRESSION:  1.  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  2.  Severe stasis cellulitis of the right lower extremity.   RECOMMENDATIONS:  On wound number one, the Wagoner 2 diabetic foot ulcer, we  will offer the patient off loading therapy initially with a custom felt  donut to be placed in his healing sandal. We will continue diabetic foot  hygiene utilizing antiseptic soap and rinsing daily. He Willeford wear a plain  cotton sock as the dressing. No antibiotic is recommended.   For wound number two, the severe stasis in the right lower extremity, we are  recommending placement of a multi-layered compression wrap.   SUBJECTIVE:  Mr. Chrostowski is a 77 year old who was last seen in the Wound Center  on Mcmackin 25, 2006. At that time he was a resident of [facility] and  under the primary care of Dr. Chilton Si. The patient was successfully treated  over a course of weeks for stasis ulceration and stasis cellulitis. He had  done well until approximately 2-3 months ago when a small amount of  breakdown was noted over the fourth metatarsal phalangeal joint. 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 He was  ultimately referred to the Wound Center for evaluation.   OBJECTIVE:  VITAL SIGNS:  On Today's exam the patient's vital signs are  stable. His blood pressure is 150/86, temperature 98.4, pulse rate 60,  respirations 20.  HEENT EXAM:  Clear.  NECK:  Supple, trachea is midline.  LUNGS:  Clear.  ABDOMEN:  Soft.  EXTREMITIES:  Exam is abnormal. On the right there is 2-3+ edema with  tenderness in the gator and the soleus area. There are no ulcerations. There  is a +2 dorsalis pedis pulse readily palpable. There are no ischemic changes  in the foot. The hyperemia and the stasis changes are prominent.   On the left lower extremity there is a surgical absence of the fifth digit.  At the head of the fourth metatarsal phalangeal joint on the volar aspect of  the foot there is a full thickness ulceration measuring 1.7 cm in diameter,  it does not appear to extend down into the bone or tendon. The dorsalis  pedis pulse is palpable. There is no malodor, there is scant drainage.   NEUROLOGIC:  The patient retains protective sensation.   ASSESSMENT:  1.  Wagoner grade 3 ulceration volar aspect of the left foot.  2.  Stasis cellulitis, right foot.   PLAN:  Offloading of wound number one with antiseptic soap  and daily  cleansing and a white cotton sock is the dressing. Follow up in 1 week.   Wound number two - multi-layer compression wrap. Follow up in the Wound  Center in 1 week.           ______________________________  Theresia Majors. Tanda Rockers, M.D.     [name]  D:  08/01/2005  T:  08/01/2005  Job:  [id number]   cc:   Maxwell Caul, M.D.  Fax: [phone]

## 2010-07-20 NOTE — Assessment & Plan Note (Signed)
[facility]   NAMETIMMY, BUBECK                  ACCOUNT NO.:  [id number]   MEDICAL RECORD NO.:  [id number]      DATE OF BIRTH:  September 04, 1929   PHYSICIAN:  Theresia Majors. Tanda Rockers, M.D.      VISIT DATE:                                     OFFICE VISIT   SUBJECTIVE:  [name] is a 77 year old diabetic with a Wagner's wound that  has been refractory to standard treatment involving serial debridements,  antibiotics and off-loading. He continues to have this ulcer on the left  fold of foot at the head of the 4th metatarsal.  We have recommended that we  proceed with hyperbaric oxygen treatment.  During the interim from his last  visit he has continued to have off-loading and Pitressin.   OBJECTIVE:  VITAL SIGNS:  Stable.  He is afebrile.  HEENT:  Clear.  The neck is supple.  There is no evidence of tympanic  membrane injury.  CARDIOVASCULAR:  The heart sounds are distant.  ABDOMEN:  Soft.  EXTREMITIES:  Suggest 2+ edema bilaterally.  The dorsalis pedis pulse are +3  and bounding bilaterally.  The wound itself has been photographed and  entered into the wound expert.  It has a transverse oval diameter of  approximately 2.2 cm.  It extends via a crevice down to and involves the  shaft and articular cartilage of the metatarsal phalangeal joint.  There is  minimum erythematous reaction. There is no callous suggestive of inadequate  off-loading.  NEUROLOGICAL:  Neurologically the patient has decreased sensation.   ASSESSMENT:  1.  A Wagner's III wound.   PLAN:  We will initiate hyperbaric oxygen utilizing 100% oxygen in two  atmospheres for 90 minutes duration and no air breaks.  We will initiate his therapy on July 16th at 10:15 a.m. We have discussed  the indications, the expectations, possible complications in terms that both  the patient and his wife seem to understand.  They give there individual and  collective verbal and written informed consent to proceed as  outlined.  We  have discussed specifically baro trauma, oxygen toxicity, claustrophobia.  We have given them an opportunity to ask questions.  They are fully informed  and wish to initiate the therapy as outlined above.           ______________________________  Theresia Majors. Tanda Rockers, M.D.     [name]  D:  09/13/2005  T:  09/13/2005  Job:  [id number]

## 2010-07-20 NOTE — Assessment & Plan Note (Signed)
[facility]   NAMEMarland [name]  RAYE, SLYTER                  ACCOUNT NO.:  [id number]   MEDICAL RECORD NO.:  [id number]      DATE OF BIRTH:  01/18/1930   PHYSICIAN:  Maxwell Caul, M.D. VISIT DATE:  05/09/2006                                   OFFICE VISIT   PURPOSE OF TODAY'S VISIT:  [name] continues to be followed here for  what was a diabetic wound on the bottom of his plantar surface of his  right foot and venous stasis ulcerations on the right leg.  To the left  foot we have been using antiseptic soap washes, a protective adhesive,  and a healing sandal.  On the right leg he has been receiving a Profore.   WOUND EXAM:  The wounds on the right leg have completely healed.  Unfortunately we have had difficulty getting graded pressure stockings,  which I am trying to work through today.  To the left plantar ulcer, the  wound continues to contract.  There is good epithelization.   MANAGEMENT PLAN & GOAL:  No debridement was necessary to the left  plantar wound.  We have continued with the adhesive protective dressing  as well as a healing sandal.  We have continued with a Profore to the  right leg.  He needs a bilateral below knee pressure stockings 20 to 40  mmHg.  We have attempted to see if we can get these covered through [facility] by ordering them through the wound care center.  It  was seen there were problems trying to order this through the nursing  home.  We will see him again in a week.           ______________________________  Maxwell Caul, M.D.     MGR/MEDQ  D:  05/09/2006  T:  05/10/2006  Job:  [id number]

## 2010-07-20 NOTE — Assessment & Plan Note (Signed)
[facility]   [name], [name]                  ACCOUNT NO.:  [id number]   MEDICAL RECORD NO.:  [id number]      DATE OF BIRTH:  26-Oct-1929   PHYSICIAN:  [name], M.D.      VISIT DATE:                                   OFFICE VISIT   SUBJECTIVE:  [name] is a 77 year old man who we followed for the  management of Wagner grade II diabetic foot ulcers.  We have had  significant difficulty in achieving adequate offloading.  Most recently  we have treated the volar aspect on the plantar surface of the left foot  and the dorsum of the right second toe with Iodosorb gel.  In the  interim, he has worn a custom shoe and insert.  There has been no  excessive drainage, malodor or fever.   OBJECTIVE:  Blood pressure 188/98.  Respirations are 18.  Pulse rate is  87.  Temperature 98.3. Inspection of the lower extremity shows that  there is trace edema. The wound on the left plantar surface shows  evidence of advance of epithelium and it is likely directly related to  adequate offloading from the patient's use of his offloading orthotics  in the interim.  The ulcer on the second toe has responded with  contraction and reepithelialization.  There is no evidence of infection.  The feet are well perfused.   IMPRESSION:  Clinical improvement.   PLAN:  We will continue the use of the Iodosorb gel sparingly on all the  wounds.  The patient will continue the custom orthotics.  We recommend  that he sleep in sheepskin booties or protective booties.  We will  reevaluate him in one month p.r.n.      [name], M.D.  Electronically Signed     HAN/MEDQ  D:  07/16/2006  T:  07/16/2006  Job:  [id number]

## 2010-07-20 NOTE — Assessment & Plan Note (Signed)
[facility]   NAMEMarland Kitchen  [name], [name]                  ACCOUNT NO.:  [id number]   MEDICAL RECORD NO.:  [id number]      DATE OF BIRTH:  22-Apr-1929   PHYSICIAN:  Maxwell Caul, M.D.      VISIT DATE:                                     OFFICE VISIT   VITAL SIGNS:  Temperature 97.8, pulse 74, respirations 16, blood pressure is  104/86.   PURPOSE OF TODAY'S VISIT:  Follow up of a wound on his left foot that was  originally a Wagoner's grade 3. He is status post hyperbaric treatment, also  status post Apligraf approximately a month ago.   WOUND EXAM:  There is almost complete resolution of this wound.   WOUND SINCE LAST VISIT:   CHANGE IN INTERVAL MEDICAL HISTORY:   DIAGNOSIS:   TREATMENT:   ANESTHETIC USED:   TISSUE DEBRIDED:  The patient had a large area of callus which I partially-  thickness debrided out to normal skin. What is left of this wound is  measured in mm, perhaps 0.2 x 0.2 cm.   LEVEL:   CHANGE IN MEDS:   COMPRESSION BANDAGE:   OTHER:   MANAGEMENT PLAN & GOAL:  I have recommended continued presence of the cam  walker to allow complete resolution of this wound. We will see him back in  10 days at which point in time I think he can be transferred back into his  diabetic footwear. He already has the right shoe. The left shoe should be  able to be worn, I think, in this timeframe.           ______________________________  Maxwell Caul, M.D.    MGR/MEDQ  D:  12/06/2005  T:  12/07/2005  Job:  [id number]

## 2010-09-03 ENCOUNTER — Ambulatory Visit (HOSPITAL_COMMUNITY)
Admission: RE | Admit: 2010-09-03 | Discharge: 2010-09-03 | Disposition: A | Discharge status: Home / Self Care | Payer: Medicare Other | Source: Ambulatory Visit | Attending: Internal Medicine | Admitting: Internal Medicine

## 2010-09-03 DIAGNOSIS — R209 Unspecified disturbances of skin sensation: Secondary | ICD-10-CM | POA: Insufficient documentation

## 2010-09-03 DIAGNOSIS — M79609 Pain in unspecified limb: Secondary | ICD-10-CM

## 2010-09-03 DIAGNOSIS — L988 Other specified disorders of the skin and subcutaneous tissue: Secondary | ICD-10-CM | POA: Insufficient documentation

## 2010-09-24 ENCOUNTER — Ambulatory Visit (INDEPENDENT_AMBULATORY_CARE_PROVIDER_SITE_OTHER): Payer: Medicare Other | Admitting: Endocrinology

## 2010-09-24 ENCOUNTER — Encounter: Payer: Self-pay | Admitting: Endocrinology

## 2010-09-24 ENCOUNTER — Other Ambulatory Visit (INDEPENDENT_AMBULATORY_CARE_PROVIDER_SITE_OTHER): Payer: Medicare Other

## 2010-09-24 VITALS — BP 144/84 | HR 68 | Temp 98.6°F | Ht 72.0 in | Wt 271.8 lb

## 2010-09-24 DIAGNOSIS — E109 Type 1 diabetes mellitus without complications: Secondary | ICD-10-CM

## 2010-09-24 LAB — HEMOGLOBIN A1C: Hgb A1c MFr Bld: 6.4 % (ref 4.6–6.5)

## 2010-09-24 NOTE — Patient Instructions (Addendum)
blood tests are being ordered for you today.  We will forward [facility] test results to [facility]. Please make a follow-up appointment in 3 months Please always send [facility] record with patient.  Pending the results of today's a1c, reduce lantus to 130 units each morning, and none in the evening.

## 2010-09-24 NOTE — Progress Notes (Signed)
Subjective:    [name], male    DOB: September 30, 1929, 77 y.o.   MRN: [id number]  HPI Our taff has called [facility], and obtained record of his cbg's which i have reviewed today.  It varies from 48-180,  It is in general higher as the day goes on.  He continues to be on lantus 100 units qam, and 60 units qpm.   Past Medical History  Diagnosis Date  . ANEMIA 01/15/2010  . DIABETIC FOOT ULCER 01/15/2010  . UNSPECIFIED PERIPHERAL VASCULAR DISEASE 01/15/2010  . BACK PAIN, LUMBAR 01/15/2010  . POLYNEUROPATHY 01/15/2010  . HYPONATREMIA 01/15/2010  . DEPRESSION 01/15/2010  . HYPERTENSION 01/15/2010  . 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 DYSLIPIDEMIA 01/15/2010  . HYPOKALEMIA 01/15/2010  . GERD 01/15/2010  . IDDM 01/15/2010    No past surgical history on file.  History   Social History  . Marital Status: Married    Spouse Name: N/A    Number of Children: N/A  . Years of Education: N/A   Occupational History  .      Retired   Social History Main Topics  . Smoking status: Former Games developer  . Smokeless tobacco: Not on file  . Alcohol Use: Not on file  . Drug Use: Not on file  . Sexually Active: Not on file   Other Topics Concern  . Not on file   Social History Narrative   Lives at [facility]    Current Outpatient Prescriptions on File Prior to Visit  Medication Sig Dispense Refill  . aspirin 81 MG tablet Take 81 mg by mouth daily.        . DULoxetine (CYMBALTA) 30 MG capsule Take 30 mg by mouth at bedtime.       Marland Kitchen escitalopram (LEXAPRO) 20 MG tablet Take 20 mg by mouth daily.        . Ferrous Sulfate (IRON) 325 (65 FE) MG TABS Take 1 tablet by mouth daily.        . folic acid (FOLVITE) 1 MG tablet Take 1 mg by mouth daily.        . furosemide (LASIX) 40 MG tablet 1 1/2 tablets (60mg ) every evening      . hydrALAZINE (APRESOLINE) 25 MG tablet Take 25 mg by mouth every 6 (six) hours.        . Hydroactive Dressings (AQUAFLO HYDROGEL WOUND  DRESS EX) Apply to affected areas once daily       . hydroxypropyl methylcellulose (ISOPTO TEARS) 2.5 % ophthalmic solution 1 drop in each eye every 6 hours while awake       . insulin aspart (NOVOLOG) 100 UNIT/ML injection Sliding scale       . insulin glargine (LANTUS) 100 UNIT/ML injection 100 units every morning, and 60 units in the evening      . ipratropium (ATROVENT) 0.02 % nebulizer solution Take 500 mcg by nebulization. As needed every 4 hours for SOB       . isosorbide mononitrate (IMDUR) 60 MG 24 hr tablet Take 60 mg by mouth daily.        . Lactulose 20 GM/30ML SOLN 30cc's every morning       . lidocaine (LIDODERM) 5 % Place 1 patch onto the skin every 12 (twelve) hours. Remove & Discard patch within 12 hours or as directed by MD       . lisinopril (PRINIVIL,ZESTRIL) 20 MG tablet Take 40 mg by mouth daily.       Marland Kitchen  loratadine (CLARITIN) 10 MG tablet Take 10 mg by mouth daily.        Marland Kitchen LORazepam (ATIVAN) 0.5 MG tablet Take 0.5 mg by mouth every 8 (eight) hours as needed.        . magnesium hydroxide (MILK OF MAGNESIA) 400 MG/5ML suspension Take by mouth daily as needed.        . metoprolol (LOPRESSOR) 100 MG tablet 2 (two) times daily. 150mg  by mouth two times daily      . Multiple Vitamins-Minerals (DECUBI-VITE) CAPS Take 2 capsules by mouth daily.        Marland Kitchen OLANZapine (ZYPREXA) 5 MG tablet Take 5 mg by mouth at bedtime.        Marland Kitchen oxybutynin (DITROPAN-XL) 10 MG 24 hr tablet Take 10 mg by mouth at bedtime.        Marland Kitchen oxycodone (OXY-IR) 5 MG capsule Take 5 mg by mouth every 4 (four) hours as needed.        Marland Kitchen oxycodone (OXYCONTIN) 30 MG TB12 Take 30 mg by mouth every 12 (twelve) hours as needed.        Bertram Gala Glycol-Propyl Glycol (SYSTANE ULTRA PF) 0.4-0.3 % SOLN 1 drop in each eye three times a day       . polyethylene glycol powder (GLYCOLAX/MIRALAX) powder Take 17 g by mouth daily.        . ranitidine (ZANTAC) 150 MG tablet Take 150 mg by mouth at bedtime.        . senna (SENOKOT)  8.6 MG tablet Take 2 tablets by mouth at bedtime.        Marland Kitchen albuterol (ACCUNEB) 0.63 MG/3ML nebulizer solution Take 1 ampule by nebulization every 4 (four) hours as needed.        Marland Kitchen amitriptyline (ELAVIL) 50 MG tablet Take 50 mg by mouth at bedtime.          No Known Allergies  Family History  Problem Relation Age of Onset  . Diabetes Neg Hx     No DM in immediate family    BP 144/84  Pulse 68  Temp(Src) 98.6 F (37 C) (Oral)  Ht 6' (1.829 m)  Wt 271 lb 12.8 oz (123.288 kg)  BMI 36.86 kg/m2  SpO2 97%  Review of Systems Denies loc    Objective:   Physical Exam elderly, frail, no distress,  Obese.  In wheelchair.   SKIN:  Insulin injection sites at the anterior abdomen are normal, except for a few ecchymoses.    Assessment & Plan:  Dm, overcontrolled

## 2010-11-24 ENCOUNTER — Emergency Department (HOSPITAL_COMMUNITY)
Admission: EM | Admit: 2010-11-24 | Discharge: 2010-11-24 | Disposition: A | Discharge status: Home / Self Care | Payer: Medicare Other | Attending: Emergency Medicine | Admitting: Emergency Medicine

## 2010-11-24 DIAGNOSIS — K2970 Gastritis, unspecified, without bleeding: Secondary | ICD-10-CM | POA: Insufficient documentation

## 2010-11-24 DIAGNOSIS — K2990 Gastroduodenitis, unspecified, without bleeding: Secondary | ICD-10-CM | POA: Insufficient documentation

## 2010-11-24 DIAGNOSIS — E119 Type 2 diabetes mellitus without complications: Secondary | ICD-10-CM | POA: Insufficient documentation

## 2010-11-24 DIAGNOSIS — Z794 Long term (current) use of insulin: Secondary | ICD-10-CM | POA: Insufficient documentation

## 2010-11-24 DIAGNOSIS — Z66 Do not resuscitate: Secondary | ICD-10-CM | POA: Insufficient documentation

## 2010-11-24 DIAGNOSIS — R1013 Epigastric pain: Secondary | ICD-10-CM | POA: Insufficient documentation

## 2010-11-24 DIAGNOSIS — I10 Essential (primary) hypertension: Secondary | ICD-10-CM | POA: Insufficient documentation

## 2010-11-24 DIAGNOSIS — I739 Peripheral vascular disease, unspecified: Secondary | ICD-10-CM | POA: Insufficient documentation

## 2010-12-24 ENCOUNTER — Encounter: Payer: Self-pay | Admitting: Endocrinology

## 2010-12-24 ENCOUNTER — Ambulatory Visit (INDEPENDENT_AMBULATORY_CARE_PROVIDER_SITE_OTHER): Payer: Medicare Other | Admitting: Endocrinology

## 2010-12-24 ENCOUNTER — Other Ambulatory Visit (INDEPENDENT_AMBULATORY_CARE_PROVIDER_SITE_OTHER): Payer: Medicare Other

## 2010-12-24 VITALS — BP 138/72 | HR 61 | Temp 98.5°F | Wt 272.0 lb

## 2010-12-24 DIAGNOSIS — Z79899 Other long term (current) drug therapy: Secondary | ICD-10-CM

## 2010-12-24 DIAGNOSIS — E109 Type 1 diabetes mellitus without complications: Secondary | ICD-10-CM

## 2010-12-24 NOTE — Patient Instructions (Addendum)
blood tests are being ordered for you today.  We will forward your test results to [facility]. Please make a follow-up appointment in 3 weeks. Please always send cbg record with patient.  Pending the results of today's a1c, reduce lantus to 110 units each morning, and none in the evening.  Add scheduled novolog, 7 unit with each meal.   Continue the same prn novolog.

## 2010-12-24 NOTE — Progress Notes (Signed)
Subjective:    [name], male    DOB: [birth year], 77 y.o.   MRN: [id number]  HPI pt states he feels well in general, except for a recent uri.  he brings a record of his cbg's which i have reviewed today.  It varies from 80-300.  It is in general higher as the day goes on.   Past Medical History  Diagnosis Date  . ANEMIA 01/15/2010  . DIABETIC FOOT ULCER 01/15/2010  . UNSPECIFIED PERIPHERAL VASCULAR DISEASE 01/15/2010  . BACK PAIN, LUMBAR 01/15/2010  . POLYNEUROPATHY 01/15/2010  . HYPONATREMIA 01/15/2010  . DEPRESSION 01/15/2010  . HYPERTENSION 01/15/2010  . 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 DYSLIPIDEMIA 01/15/2010  . HYPOKALEMIA 01/15/2010  . GERD 01/15/2010  . IDDM 01/15/2010    No past surgical history on file.  History   Social History  . Marital Status: Married    Spouse Name: N/A    Number of Children: N/A  . Years of Education: N/A   Occupational History  .      Retired   Social History Main Topics  . Smoking status: Former Games developer  . Smokeless tobacco: Not on file  . Alcohol Use: Not on file  . Drug Use: Not on file  . Sexually Active: Not on file   Other Topics Concern  . Not on file   Social History Narrative   Lives at [facility]    Current Outpatient Prescriptions on File Prior to Visit  Medication Sig Dispense Refill  . aspirin 81 MG tablet Take 81 mg by mouth daily.        . folic acid (FOLVITE) 1 MG tablet Take 1 mg by mouth daily.        . furosemide (LASIX) 40 MG tablet 1 1/2 tablets (60mg ) every evening      . furosemide (LASIX) 80 MG tablet Take 80 mg by mouth every morning.        . hydrALAZINE (APRESOLINE) 25 MG tablet Take 25 mg by mouth every 6 (six) hours.        . Hydroactive Dressings (AQUAFLO HYDROGEL WOUND DRESS EX) Apply to affected areas once daily       . hydroxypropyl methylcellulose (ISOPTO TEARS) 2.5 % ophthalmic solution 1 drop in each eye every 6 hours while awake       . insulin aspart  (NOVOLOG) 100 UNIT/ML injection Inject 7 Units into the skin 3 (three) times daily before meals. Sliding scale      . insulin glargine (LANTUS) 100 UNIT/ML injection 130 units every morning      . isosorbide mononitrate (IMDUR) 60 MG 24 hr tablet Take 60 mg by mouth daily.        . Lactulose 20 GM/30ML SOLN 30cc's every morning       . lidocaine (LIDODERM) 5 % Place 1 patch onto the skin every 12 (twelve) hours. Remove & Discard patch within 12 hours or as directed by MD       . lisinopril (PRINIVIL,ZESTRIL) 20 MG tablet Take 40 mg by mouth daily.       Marland Kitchen loratadine (CLARITIN) 10 MG tablet 1 tablet by mouth daily as needed for allergies      . LORazepam (ATIVAN) 0.5 MG tablet Take 0.5 mg by mouth every 8 (eight) hours as needed.        . magnesium hydroxide (MILK OF MAGNESIA) 400 MG/5ML suspension Take by mouth daily as needed.        Marland Kitchen  metoprolol (LOPRESSOR) 100 MG tablet 2 (two) times daily. 150mg  by mouth two times daily      . Multiple Vitamins-Minerals (DECUBI-VITE) CAPS Take 2 capsules by mouth daily.        Marland Kitchen OLANZapine (ZYPREXA) 5 MG tablet Take 5 mg by mouth at bedtime.        Marland Kitchen oxybutynin (DITROPAN-XL) 10 MG 24 hr tablet Take 10 mg by mouth at bedtime.        Marland Kitchen oxycodone (OXY-IR) 5 MG capsule Take 5 mg by mouth every 4 (four) hours as needed.        Marland Kitchen oxycodone (OXYCONTIN) 30 MG TB12 Take 30 mg by mouth 2 (two) times daily.       Bertram Gala Glycol-Propyl Glycol (SYSTANE ULTRA PF) 0.4-0.3 % SOLN 1 drop in each eye three times a day       . polyethylene glycol powder (GLYCOLAX/MIRALAX) powder Take 17 g by mouth daily.        Marland Kitchen senna (SENOKOT) 8.6 MG tablet Take 2 tablets by mouth at bedtime.          No Known Allergies  Family History  Problem Relation Age of Onset  . Diabetes Neg Hx     No DM in immediate family    BP 138/72  Pulse 61  Temp(Src) 98.5 F (36.9 C) (Oral)  Wt 272 lb (123.378 kg)  SpO2 97%   Review of Systems denies hypoglycemia    Objective:   Physical  Exam VITAL SIGNS:  See vs page GENERAL: no distress.  In wheelchair.   Feet: (in bandages--pt says he gets foot care at [facility]). Psych:  Alert/cooperative.  Oriented to self, place only.   Lab Results  Component Value Date   HGBA1C 6.5 12/24/2010       Assessment & Plan:  DM.  The pattern of his cbg's indicates the need for mealtime insulin, and less lantus

## 2011-01-14 ENCOUNTER — Encounter: Payer: Self-pay | Admitting: Endocrinology

## 2011-01-14 ENCOUNTER — Ambulatory Visit (INDEPENDENT_AMBULATORY_CARE_PROVIDER_SITE_OTHER): Payer: Medicare Other | Admitting: Endocrinology

## 2011-01-14 DIAGNOSIS — E109 Type 1 diabetes mellitus without complications: Secondary | ICD-10-CM

## 2011-01-14 NOTE — Progress Notes (Signed)
Subjective:    [name], male    DOB: 14-Dec-1929, 77 y.o.   MRN: [id number]  HPI Pt returns for f/u of insulin-requiring diabetes, dx'ed many years ago.  he brings a record of his cbg's which i have reviewed today.  It varies from 130-300's.  It is in general higher as the day goes on, but not necessarily so.  pt states he feels well in general. Past Medical History  Diagnosis Date  . ANEMIA 01/15/2010  . DIABETIC FOOT ULCER 01/15/2010  . UNSPECIFIED PERIPHERAL VASCULAR DISEASE 01/15/2010  . BACK PAIN, LUMBAR 01/15/2010  . POLYNEUROPATHY 01/15/2010  . HYPONATREMIA 01/15/2010  . DEPRESSION 01/15/2010  . HYPERTENSION 01/15/2010  . 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 DYSLIPIDEMIA 01/15/2010  . HYPOKALEMIA 01/15/2010  . GERD 01/15/2010  . IDDM 01/15/2010    No past surgical history on file.  History   Social History  . Marital Status: Married    Spouse Name: N/A    Number of Children: N/A  . Years of Education: N/A   Occupational History  .      Retired   Social History Main Topics  . Smoking status: Former Games developer  . Smokeless tobacco: Not on file  . Alcohol Use: Not on file  . Drug Use: Not on file  . Sexually Active: Not on file   Other Topics Concern  . Not on file   Social History Narrative   Lives at [facility]    Current Outpatient Prescriptions on File Prior to Visit  Medication Sig Dispense Refill  . aspirin 81 MG tablet Take 81 mg by mouth daily.        . citalopram (CELEXA) 10 MG tablet Take 30 mg by mouth daily.        . fenofibrate (TRICOR) 48 MG tablet Take 48 mg by mouth daily.        . folic acid (FOLVITE) 1 MG tablet Take 1 mg by mouth daily.        . furosemide (LASIX) 40 MG tablet 1 1/2 tablets (60mg ) every evening      . furosemide (LASIX) 80 MG tablet Take 80 mg by mouth every morning.        . hydrALAZINE (APRESOLINE) 25 MG tablet Take 25 mg by mouth every 6 (six) hours.        . Hydroactive Dressings  (AQUAFLO HYDROGEL WOUND DRESS EX) Apply to affected areas once daily       . hydroxypropyl methylcellulose (ISOPTO TEARS) 2.5 % ophthalmic solution 1 drop in each eye every 6 hours while awake       . insulin aspart (NOVOLOG) 100 UNIT/ML injection Inject 15 Units into the skin 3 (three) times daily before meals. Sliding scale      . insulin glargine (LANTUS) 100 UNIT/ML injection Inject 100 Units into the skin every morning.       . isosorbide mononitrate (IMDUR) 60 MG 24 hr tablet Take 60 mg by mouth daily.        . Lactulose 20 GM/30ML SOLN 30cc's every morning       . latanoprost (XALATAN) 0.005 % ophthalmic solution Place 1 drop into both eyes at bedtime.        . lidocaine (LIDODERM) 5 % Place 1 patch onto the skin every 12 (twelve) hours. Remove & Discard patch within 12 hours or as directed by MD       . loratadine (CLARITIN) 10 MG tablet 1 tablet  by mouth daily as needed for allergies      . LORazepam (ATIVAN) 0.5 MG tablet Take 0.5 mg by mouth every 8 (eight) hours as needed.        . magnesium hydroxide (MILK OF MAGNESIA) 400 MG/5ML suspension Take by mouth daily as needed.        . menthol-cetylpyridinium (CEPACOL) 3 MG lozenge Take 1 lozenge by mouth as needed.        . metoprolol (LOPRESSOR) 100 MG tablet 2 (two) times daily. 150mg  by mouth two times daily      . Multiple Vitamins-Minerals (DECUBI-VITE) CAPS Take 2 capsules by mouth daily.        Marland Kitchen OLANZapine (ZYPREXA) 5 MG tablet Take 5 mg by mouth at bedtime.        Marland Kitchen oxybutynin (DITROPAN-XL) 10 MG 24 hr tablet Take 10 mg by mouth at bedtime.        Marland Kitchen oxycodone (OXY-IR) 5 MG capsule Take 5 mg by mouth every 4 (four) hours as needed.        Marland Kitchen oxycodone (OXYCONTIN) 30 MG TB12 Take 30 mg by mouth 2 (two) times daily.       . pantoprazole (PROTONIX) 40 MG tablet Take 40 mg by mouth daily.        Bertram Gala Glycol-Propyl Glycol (SYSTANE ULTRA PF) 0.4-0.3 % SOLN 1 drop in each eye three times a day       . polyethylene glycol powder  (GLYCOLAX/MIRALAX) powder Take 17 g by mouth daily.        Marland Kitchen senna (SENOKOT) 8.6 MG tablet Take 2 tablets by mouth at bedtime.        . sucralfate (CARAFATE) 1 G tablet Take 1 g by mouth 2 (two) times daily.        Marland Kitchen triamcinolone (KENALOG) 0.1 % cream Apply topically daily. To both legs daily         No Known Allergies  Family History  Problem Relation Age of Onset  . Diabetes Neg Hx     No DM in immediate family    BP 148/60  Pulse 54  Temp(Src) 98.9 F (37.2 C) (Oral)  Ht 6' (1.829 m)  Wt 284 lb (128.822 kg)  BMI 38.52 kg/m2  SpO2 99%  Review of Systems denies hypoglycemia    Objective:   Physical Exam Vital signs: see vs page Gen: elderly, frail, no distress.  In wheelchair.   SKIN:  Insulin injection sites at the anterior abdomen are normal.    Assessment & Plan:  DM:  The pattern of his cbg's indicates he needs some adjustment in his therapy

## 2011-01-14 NOTE — Patient Instructions (Addendum)
Please make a follow-up appointment in 3 weeks. Please always send cbg record and current med list with patient.  Pending the results of today's a1c, reduce lantus to 100 units each morning, and none in the evening.  increase scheduled novolog to 15 units with each meal.   Change prn novolog to:  < 250: none 250-299: 2 units 300-399: 4 units Over 400: 6 units

## 2011-02-04 ENCOUNTER — Ambulatory Visit (INDEPENDENT_AMBULATORY_CARE_PROVIDER_SITE_OTHER): Payer: Medicare Other | Admitting: Endocrinology

## 2011-02-04 ENCOUNTER — Encounter: Payer: Self-pay | Admitting: Endocrinology

## 2011-02-04 DIAGNOSIS — E109 Type 1 diabetes mellitus without complications: Secondary | ICD-10-CM

## 2011-02-04 NOTE — Patient Instructions (Addendum)
Please make a follow-up appointment in 2 months. Please always send cbg record and current med list with patient.  continue lantus100 units each morning, and none in the evening.  continue scheduled novolog 15 units with each meal.   Change prn novolog to:  < 250: none 250-299: 2 units 300-399: 4 units Over 400: 6 units

## 2011-02-04 NOTE — Progress Notes (Signed)
Subjective:    [name], male    DOB: December 02, 1929, 77 y.o.   MRN: [id number]  HPI Pt returns for f/u of insulin-requiring DM (approx 1987).  He lives at [facility].  he brings a record of his cbg's which i have reviewed today.  It varies from 110-283.  There is no trend throughout the day. Past Medical History  Diagnosis Date  . ANEMIA 01/15/2010  . DIABETIC FOOT ULCER 01/15/2010  . UNSPECIFIED PERIPHERAL VASCULAR DISEASE 01/15/2010  . BACK PAIN, LUMBAR 01/15/2010  . POLYNEUROPATHY 01/15/2010  . HYPONATREMIA 01/15/2010  . DEPRESSION 01/15/2010  . HYPERTENSION 01/15/2010  . 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 DYSLIPIDEMIA 01/15/2010  . HYPOKALEMIA 01/15/2010  . GERD 01/15/2010  . IDDM 01/15/2010    No past surgical history on file.  History   Social History  . Marital Status: Married    Spouse Name: N/A    Number of Children: N/A  . Years of Education: N/A   Occupational History  .      Retired   Social History Main Topics  . Smoking status: Former Games developer  . Smokeless tobacco: Not on file  . Alcohol Use: Not on file  . Drug Use: Not on file  . Sexually Active: Not on file   Other Topics Concern  . Not on file   Social History Narrative   Lives at [facility]    Current Outpatient Prescriptions on File Prior to Visit  Medication Sig Dispense Refill  . aspirin 81 MG tablet Take 81 mg by mouth daily.        . fenofibrate (TRICOR) 48 MG tablet Take 48 mg by mouth daily.        . folic acid (FOLVITE) 1 MG tablet Take 1 mg by mouth daily.        . furosemide (LASIX) 40 MG tablet 1 1/2 tablets (60mg ) every evening      . furosemide (LASIX) 80 MG tablet Take 80 mg by mouth every morning.        Marland Kitchen guaiFENesin (ROBITUSSIN) 100 MG/5ML liquid Take 100 mg by mouth every 4 (four) hours as needed.        . hydrALAZINE (APRESOLINE) 25 MG tablet Take 25 mg by mouth every 6 (six) hours.        . Hydroactive Dressings (AQUAFLO HYDROGEL WOUND  DRESS EX) Apply to affected areas once daily       . hydroxypropyl methylcellulose (ISOPTO TEARS) 2.5 % ophthalmic solution 1 drop in each eye every 6 hours while awake       . insulin aspart (NOVOLOG) 100 UNIT/ML injection Inject 15 Units into the skin 3 (three) times daily before meals. Sliding scale      . insulin glargine (LANTUS) 100 UNIT/ML injection Inject 100 Units into the skin every morning.       Marland Kitchen ipratropium (ATROVENT) 0.02 % nebulizer solution Take 500 mcg by nebulization every 4 (four) hours as needed. For SOB       . isosorbide mononitrate (IMDUR) 60 MG 24 hr tablet Take 60 mg by mouth daily.        . Lactulose 20 GM/30ML SOLN 30cc's every morning       . latanoprost (XALATAN) 0.005 % ophthalmic solution Place 1 drop into both eyes at bedtime.        . lidocaine (LIDODERM) 5 % Place 1 patch onto the skin every 12 (twelve) hours. Remove & Discard patch within 12  hours or as directed by MD       . lisinopril (PRINIVIL,ZESTRIL) 40 MG tablet Take 40 mg by mouth daily.        Marland Kitchen loratadine (CLARITIN) 10 MG tablet 1 tablet by mouth daily as needed for allergies      . LORazepam (ATIVAN) 0.5 MG tablet Take 0.5 mg by mouth every 8 (eight) hours as needed.        . magnesium hydroxide (MILK OF MAGNESIA) 400 MG/5ML suspension Take by mouth daily as needed.        . menthol-cetylpyridinium (CEPACOL) 3 MG lozenge Take 1 lozenge by mouth as needed.        . metoprolol (LOPRESSOR) 100 MG tablet 2 (two) times daily. 150mg  by mouth two times daily      . Multiple Vitamins-Minerals (DECUBI-VITE) CAPS Take 2 capsules by mouth daily.        Marland Kitchen OLANZapine (ZYPREXA) 5 MG tablet Take 5 mg by mouth at bedtime.        Marland Kitchen oxybutynin (DITROPAN-XL) 10 MG 24 hr tablet Take 10 mg by mouth at bedtime.        Marland Kitchen oxycodone (OXY-IR) 5 MG capsule Take 5 mg by mouth every 4 (four) hours as needed.        Marland Kitchen oxycodone (OXYCONTIN) 30 MG TB12 Take 30 mg by mouth 2 (two) times daily.       . pantoprazole (PROTONIX) 40 MG  tablet Take 40 mg by mouth daily.        Bertram Gala Glycol-Propyl Glycol (SYSTANE ULTRA PF) 0.4-0.3 % SOLN 1 drop in each eye three times a day       . polyethylene glycol powder (GLYCOLAX/MIRALAX) powder Take 17 g by mouth daily.        Marland Kitchen senna (SENOKOT) 8.6 MG tablet Take 2 tablets by mouth at bedtime.        . sucralfate (CARAFATE) 1 G tablet Take 1 g by mouth 2 (two) times daily.        Marland Kitchen triamcinolone (KENALOG) 0.1 % cream Apply topically daily. To both legs daily         No Known Allergies  Family History  Problem Relation Age of Onset  . Diabetes Neg Hx     No DM in immediate family    BP 148/82  Pulse 54  Temp(Src) 98.8 F (37.1 C) (Oral)  Ht 6' (1.829 m)  Wt 284 lb (128.822 kg)  BMI 38.52 kg/m2  SpO2 95%  Review of Systems denies hypoglycemia    Objective:   Physical Exam VITAL SIGNS:  See vs page GENERAL: no distress.   Feet: (in bandages--pt says he gets foot care at [facility]).  Psych: Alert/cooperative. Oriented to self, place only     Assessment & Plan:  DM, apparently well-controlled.

## 2011-04-08 ENCOUNTER — Ambulatory Visit (INDEPENDENT_AMBULATORY_CARE_PROVIDER_SITE_OTHER): Payer: Medicare Other | Admitting: Endocrinology

## 2011-04-08 ENCOUNTER — Encounter: Payer: Self-pay | Admitting: Endocrinology

## 2011-04-08 VITALS — BP 146/82 | HR 63 | Temp 97.5°F | Ht 74.0 in | Wt 289.0 lb

## 2011-04-08 DIAGNOSIS — E109 Type 1 diabetes mellitus without complications: Secondary | ICD-10-CM

## 2011-04-08 NOTE — Progress Notes (Signed)
Subjective:    [name], male    DOB: [birth year], 78 y.o.   MRN: [id number]  HPI Pt returns for f/u of insulin-requiring DM (approx 1987). He lives at [facility]. he brings a record of his cbg's which i have reviewed today. It varies from 97-223, but most are in the 100's. There is no trend throughout the day.  pt states he feels well in general. Past Medical History  Diagnosis Date  . ANEMIA 01/15/2010  . DIABETIC FOOT ULCER 01/15/2010  . UNSPECIFIED PERIPHERAL VASCULAR DISEASE 01/15/2010  . BACK PAIN, LUMBAR 01/15/2010  . POLYNEUROPATHY 01/15/2010  . HYPONATREMIA 01/15/2010  . DEPRESSION 01/15/2010  . HYPERTENSION 01/15/2010  . 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 DYSLIPIDEMIA 01/15/2010  . HYPOKALEMIA 01/15/2010  . GERD 01/15/2010  . IDDM 01/15/2010    No past surgical history on file.  History   Social History  . Marital Status: Married    Spouse Name: N/A    Number of Children: N/A  . Years of Education: N/A   Occupational History  .      Retired   Social History Main Topics  . Smoking status: Former Games developer  . Smokeless tobacco: Not on file  . Alcohol Use: Not on file  . Drug Use: Not on file  . Sexually Active: Not on file   Other Topics Concern  . Not on file   Social History Narrative   Lives at [facility]    Current Outpatient Prescriptions on File Prior to Visit  Medication Sig Dispense Refill  . aspirin 81 MG tablet Take 81 mg by mouth daily.        . citalopram (CELEXA) 20 MG tablet Take 20 mg by mouth daily.        . fenofibrate (TRICOR) 48 MG tablet Take 48 mg by mouth daily.        . folic acid (FOLVITE) 1 MG tablet Take 1 mg by mouth daily.        . furosemide (LASIX) 40 MG tablet 1 1/2 tablets (60mg ) every evening      . furosemide (LASIX) 80 MG tablet Take 80 mg by mouth every morning.        Marland Kitchen guaiFENesin (ROBITUSSIN) 100 MG/5ML liquid Take 100 mg by mouth every 4 (four) hours as needed.        .  hydrALAZINE (APRESOLINE) 25 MG tablet Take 25 mg by mouth every 6 (six) hours.        . Hydroactive Dressings (AQUAFLO HYDROGEL WOUND DRESS EX) Apply to affected areas once daily       . hydroxypropyl methylcellulose (ISOPTO TEARS) 2.5 % ophthalmic solution 1 drop in each eye every 6 hours while awake       . insulin aspart (NOVOLOG) 100 UNIT/ML injection Inject 15 Units into the skin 3 (three) times daily before meals. Sliding scale      . insulin glargine (LANTUS) 100 UNIT/ML injection Inject 100 Units into the skin every morning.       Marland Kitchen ipratropium (ATROVENT) 0.02 % nebulizer solution Take 500 mcg by nebulization every 4 (four) hours as needed. For SOB       . isosorbide mononitrate (IMDUR) 60 MG 24 hr tablet Take 60 mg by mouth daily.        . Lactulose 20 GM/30ML SOLN 30cc's every morning       . latanoprost (XALATAN) 0.005 % ophthalmic solution Place 1 drop into both eyes at  bedtime.        . lidocaine (LIDODERM) 5 % Place 1 patch onto the skin every 12 (twelve) hours. Remove & Discard patch within 12 hours or as directed by MD       . lisinopril (PRINIVIL,ZESTRIL) 40 MG tablet Take 40 mg by mouth daily.        Marland Kitchen loratadine (CLARITIN) 10 MG tablet 1 tablet by mouth daily as needed for allergies      . LORazepam (ATIVAN) 0.5 MG tablet Take 0.5 mg by mouth every 8 (eight) hours as needed.        . magnesium hydroxide (MILK OF MAGNESIA) 400 MG/5ML suspension Take by mouth daily as needed.        . menthol-cetylpyridinium (CEPACOL) 3 MG lozenge Take 1 lozenge by mouth as needed.        . metoprolol (LOPRESSOR) 100 MG tablet 2 (two) times daily. 150mg  by mouth two times daily      . Multiple Vitamins-Minerals (DECUBI-VITE) CAPS Take 2 capsules by mouth daily.        Marland Kitchen OLANZapine (ZYPREXA) 5 MG tablet Take 5 mg by mouth at bedtime.        Marland Kitchen oxybutynin (DITROPAN-XL) 10 MG 24 hr tablet Take 10 mg by mouth at bedtime.        Marland Kitchen oxycodone (OXY-IR) 5 MG capsule Take 5 mg by mouth every 4 (four) hours  as needed.        Marland Kitchen oxycodone (OXYCONTIN) 30 MG TB12 Take 30 mg by mouth 2 (two) times daily.       . pantoprazole (PROTONIX) 40 MG tablet Take 40 mg by mouth daily.        Bertram Gala Glycol-Propyl Glycol (SYSTANE ULTRA PF) 0.4-0.3 % SOLN 1 drop in each eye three times a day       . polyethylene glycol powder (GLYCOLAX/MIRALAX) powder Take 17 g by mouth daily.        Marland Kitchen senna (SENOKOT) 8.6 MG tablet Take 2 tablets by mouth at bedtime.        . sucralfate (CARAFATE) 1 G tablet Take 1 g by mouth 2 (two) times daily.        Marland Kitchen triamcinolone (KENALOG) 0.1 % cream Apply topically daily. To both legs daily         No Known Allergies  Family History  Problem Relation Age of Onset  . Diabetes Neg Hx     No DM in immediate family    BP 146/82  Pulse 63  Temp(Src) 97.5 F (36.4 C) (Oral)  Ht 6\' 2"  (1.88 m)  Wt 289 lb (131.09 kg)  BMI 37.11 kg/m2  SpO2 97%   Review of Systems denies hypoglycemia    Objective:   Physical Exam VITAL SIGNS:  See vs page GENERAL: no distress. SKIN:  Insulin injection sites at the anterior abdomen are normal      Assessment & Plan:  DM, apparently well-controlled

## 2011-04-08 NOTE — Patient Instructions (Addendum)
Please make a follow-up appointment in 3 months. blood tests are being requested for you today. We'll send results to [facility]. Please always send cbg record and current med list with patient.  continue lantus 100 units each morning, and none in the evening.  continue scheduled novolog 15 units with each meal.   Change prn novolog to:  < 250: none 250-299: 2 units 300-399: 4 units Over 400: 6 units

## 2012-06-09 ENCOUNTER — Non-Acute Institutional Stay (SKILLED_NURSING_FACILITY): Payer: Medicare Other | Admitting: Internal Medicine

## 2012-06-09 ENCOUNTER — Encounter: Payer: Self-pay | Admitting: Internal Medicine

## 2012-06-09 DIAGNOSIS — I5032 Chronic diastolic (congestive) heart failure: Secondary | ICD-10-CM

## 2012-06-09 DIAGNOSIS — I509 Heart failure, unspecified: Secondary | ICD-10-CM

## 2012-06-09 DIAGNOSIS — R635 Abnormal weight gain: Secondary | ICD-10-CM

## 2012-06-13 DIAGNOSIS — I5032 Chronic diastolic (congestive) heart failure: Secondary | ICD-10-CM | POA: Insufficient documentation

## 2012-06-13 DIAGNOSIS — R635 Abnormal weight gain: Secondary | ICD-10-CM | POA: Insufficient documentation

## 2012-06-13 HISTORY — DX: Chronic diastolic (congestive) heart failure: I50.32

## 2012-06-13 NOTE — Assessment & Plan Note (Signed)
Gained 3 lbs in 2 days but has no other evidence of overt volume overload.  Asked staff to continue to monitor and notify us if weight continues to trend up or he develops increased dyspnea, edema, PND or rales.  No changes were made to his meds.  Is on concho NAS diet.

## 2012-06-13 NOTE — Progress Notes (Signed)
[name], male   DOB: Feb 02, 1930, 79 y.o.   MRN: [id number]  Chief Complaint: 3 lb weight gain in 2 days  HPI:  79 yo male with h/o CHF, DMII on insulin (with erratic glucose), PVD, diabetic foot ulcer, prior alcohol abuse was seen due to nursing noting 3 lb weight gain in 2 days.  When seen, he denied an increase in his dyspnea, any change in his diet, or increased edema.  He denies PND, but does have chronic orthopnea.  Staff see no difference in him.    Review of Systems:  Review of Systems  Constitutional: Negative for fever, chills and malaise/fatigue.  HENT: Negative for congestion.   Respiratory: Positive for shortness of breath. Negative for cough and sputum production.   Cardiovascular: Positive for orthopnea. Negative for chest pain, palpitations, leg swelling and PND.  Gastrointestinal: Negative for constipation.  Genitourinary: Negative for dysuria.  Neurological: Negative for loss of consciousness and headaches.  Endo/Heme/Allergies:       Diabetes  Psychiatric/Behavioral: Positive for depression.     Medications: Patient's Medications  New Prescriptions   No medications on file  Previous Medications   AMLODIPINE (NORVASC) 5 MG TABLET    Take 5 mg by mouth daily.   ASPIRIN 81 MG TABLET    Take 81 mg by mouth daily.     CITALOPRAM (CELEXA) 20 MG TABLET    Take 20 mg by mouth daily.     COAL TAR (NEUTROGENA T-GEL) 0.5 % SHAMPOO    Apply 1 application topically once a week.   DORZOLAMIDE-TIMOLOL (COSOPT) 22.3-6.8 MG/ML OPHTHALMIC SOLUTION    Place 1 drop into both eyes 2 (two) times daily.   FENOFIBRATE (TRICOR) 48 MG TABLET    Take 48 mg by mouth daily.     FOLIC ACID (FOLVITE) 1 MG TABLET    Take 1 mg by mouth daily.     FUROSEMIDE (LASIX) 40 MG TABLET    Take 40 mg by mouth 2 (two) times daily.    HYDRALAZINE (APRESOLINE) 25 MG TABLET    Take 25 mg by mouth every 6 (six) hours.     INSULIN ASPART (NOVOLOG) 100 UNIT/ML INJECTION    Inject 5 Units into the  skin 3 (three) times daily before meals. Sliding scale   INSULIN GLARGINE (LANTUS) 100 UNIT/ML INJECTION    Inject 20 Units into the skin at bedtime.    IPRATROPIUM (ATROVENT) 0.02 % NEBULIZER SOLUTION    Take 500 mcg by nebulization every 4 (four) hours as needed. For SOB    ISOSORBIDE MONONITRATE (IMDUR) 60 MG 24 HR TABLET    Take 60 mg by mouth daily.     LACTULOSE 20 GM/30ML SOLN    30cc's every morning    LAMOTRIGINE (LAMICTAL) 25 MG TABLET    Take 50 mg by mouth daily.   LATANOPROST (XALATAN) 0.005 % OPHTHALMIC SOLUTION    Place 1 drop into both eyes at bedtime.     LIDOCAINE (LIDODERM) 5 %    Place 1 patch onto the skin every 12 (twelve) hours. Remove & Discard patch within 12 hours or as directed by MD    LISINOPRIL (PRINIVIL,ZESTRIL) 40 MG TABLET    Take 40 mg by mouth daily.     LORATADINE (CLARITIN) 10 MG TABLET    1 tablet by mouth daily as needed for allergies   LORAZEPAM (ATIVAN) 0.5 MG TABLET    Take 0.5 mg by mouth every 8 (eight) hours as needed.  MAGNESIUM HYDROXIDE (MILK OF MAGNESIA) 400 MG/5ML SUSPENSION    Take by mouth daily as needed.     MEMANTINE (NAMENDA) 5 MG TABLET    Take 5 mg by mouth 2 (two) times daily.   MENTHOL-CETYLPYRIDINIUM (CEPACOL) 3 MG LOZENGE    Take 1 lozenge by mouth as needed.     METOPROLOL (LOPRESSOR) 100 MG TABLET    2 (two) times daily. 150mg  by mouth two times daily   MULTIPLE VITAMINS-MINERALS (DECUBI-VITE) CAPS    Take 2 capsules by mouth daily.     OLANZAPINE (ZYPREXA) 5 MG TABLET    Take 5 mg by mouth at bedtime.     OXYBUTYNIN (DITROPAN-XL) 10 MG 24 HR TABLET    Take 10 mg by mouth at bedtime.     OXYCODONE (OXY-IR) 5 MG CAPSULE    Take 5 mg by mouth every 6 (six) hours as needed.    OXYCODONE (OXYCONTIN) 30 MG TB12    Take 30 mg by mouth 2 (two) times daily.    PANTOPRAZOLE (PROTONIX) 40 MG TABLET    Take 40 mg by mouth daily.     POLYETHYL GLYCOL-PROPYL GLYCOL (SYSTANE ULTRA PF) 0.4-0.3 % SOLN    1 drop in each eye three times a day     POLYETHYLENE GLYCOL POWDER (GLYCOLAX/MIRALAX) POWDER    Take 17 g by mouth daily.     POTASSIUM CHLORIDE (K-DUR,KLOR-CON) 10 MEQ TABLET    Take 10 mEq by mouth daily.   SENNA (SENOKOT) 8.6 MG TABLET    Take 2 tablets by mouth at bedtime.     SUCRALFATE (CARAFATE) 1 G TABLET    Take 1 g by mouth 2 (two) times daily.     TRIAMCINOLONE (KENALOG) 0.1 % CREAM    Apply topically daily. To both legs daily   Modified Medications   No medications on file  Discontinued Medications   FUROSEMIDE (LASIX) 80 MG TABLET    Take 80 mg by mouth every morning.     GUAIFENESIN (ROBITUSSIN) 100 MG/5ML LIQUID    Take 100 mg by mouth every 4 (four) hours as needed.     HYDROACTIVE DRESSINGS (AQUAFLO HYDROGEL WOUND DRESS EX)    Apply to affected areas once daily    HYDROXYPROPYL METHYLCELLULOSE (ISOPTO TEARS) 2.5 % OPHTHALMIC SOLUTION    1 drop in each eye every 6 hours while awake      Physical Exam:  Filed Vitals:   06/09/12 1337  BP: 136/70  Pulse: 72  Temp: 97 F (36.1 C)  Resp: 19  Height: 6\' 2"  (1.88 m)  Weight: 251 lb (113.853 kg)  Physical Exam  Constitutional: He appears well-developed and well-nourished. No distress.  HENT:  Head: Normocephalic and atraumatic.  Eyes: Pupils are equal, round, and reactive to light.  Neck: Neck supple. No JVD present.  Cardiovascular: Normal rate and regular rhythm.  Exam reveals no gallop and no friction rub.   No murmur heard. Pulmonary/Chest: Effort normal and breath sounds normal.  Abdominal: Soft. He exhibits no distension.  Neurological: He is alert.  Skin: Skin is warm and dry.  Psychiatric:  Flattened affect, appears bothered by my asking him questions   Assessment/Plan Abnormal weight gain Gained 3 lbs in 2 days but has no other evidence of overt volume overload.  Asked staff to continue to monitor and notify us if weight continues to trend up or he develops increased dyspnea, edema, PND or rales.  No changes were made to his meds.  Is  on concho  NAS diet.  Chronic diastolic congestive heart failure Has some mild chronic dyspnea on exertion, but no new or worsened symptoms to suggest overt acute on chronic CHF.  See weight gain.   Family/ staff Communication: unit supervisor was present during the visit today.   Labs/tests ordered:  Continued monitoring for CHF with daily weights, concho nas diet and clinical assessment.

## 2012-06-13 NOTE — Assessment & Plan Note (Signed)
Has some mild chronic dyspnea on exertion, but no new or worsened symptoms to suggest overt acute on chronic CHF.  See weight gain.

## 2012-08-18 ENCOUNTER — Other Ambulatory Visit: Payer: Self-pay | Admitting: *Deleted

## 2012-08-18 MED ORDER — LORAZEPAM 0.5 MG PO TABS: ORAL_TABLET | ORAL | Status: DC

## 2012-09-10 ENCOUNTER — Non-Acute Institutional Stay (SKILLED_NURSING_FACILITY): Payer: Medicare Other | Admitting: Internal Medicine

## 2012-09-10 DIAGNOSIS — E109 Type 1 diabetes mellitus without complications: Secondary | ICD-10-CM

## 2012-09-10 DIAGNOSIS — I5032 Chronic diastolic (congestive) heart failure: Secondary | ICD-10-CM

## 2012-09-10 DIAGNOSIS — K5900 Constipation, unspecified: Secondary | ICD-10-CM | POA: Insufficient documentation

## 2012-09-10 DIAGNOSIS — I509 Heart failure, unspecified: Secondary | ICD-10-CM | POA: Insufficient documentation

## 2012-09-10 DIAGNOSIS — F0390 Unspecified dementia without behavioral disturbance: Secondary | ICD-10-CM | POA: Insufficient documentation

## 2012-09-10 DIAGNOSIS — G619 Inflammatory polyneuropathy, unspecified: Secondary | ICD-10-CM

## 2012-09-10 DIAGNOSIS — E876 Hypokalemia: Secondary | ICD-10-CM

## 2012-09-10 DIAGNOSIS — I10 Essential (primary) hypertension: Secondary | ICD-10-CM

## 2012-09-10 DIAGNOSIS — F329 Major depressive disorder, single episode, unspecified: Secondary | ICD-10-CM

## 2012-09-10 NOTE — Progress Notes (Signed)
[name], male   DOB: 1929/10/08, 79 y.o.   MRN: [id number]    Code Status: DNR  No Known Allergies  Chief Complaint  Patient presents with  . Medical Managment of Chronic Issues   HPI:   79 year old male with h/o CHF, DMII on insulin, PVD, depression was seen today for routine visit. He mentions feeling tired. No other complaints. He refuses care at times but takes his medications as per nursing.his weight has been stable. Reviewed cbg  Review of Systems: Constitutional: Negative for fever, chills and malaise/fatigue.  HENT: Negative for congestion.   Respiratory: Negative for cough and sputum production and shortness of breath Cardiovascular:  Negative for chest pain, palpitations, leg swelling, orthopnea and PND.  Gastrointestinal: Negative for constipation.  Genitourinary: Negative for dysuria.  Neurological: Negative for loss of consciousness and headaches.  Endo/Heme/Allergies:        Diabetes  Psychiatric/Behavioral: Positive for depression.    Past Medical History  Diagnosis Date  . ANEMIA 01/15/2010  . DIABETIC FOOT ULCER 01/15/2010  . UNSPECIFIED PERIPHERAL VASCULAR DISEASE 01/15/2010  . BACK PAIN, LUMBAR 01/15/2010  . POLYNEUROPATHY 01/15/2010  . HYPONATREMIA 01/15/2010  . DEPRESSION 01/15/2010  . HYPERTENSION 01/15/2010  . 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 DYSLIPIDEMIA 01/15/2010  . HYPOKALEMIA 01/15/2010  . GERD 01/15/2010  . IDDM 01/15/2010   Social History:   reports that he has quit smoking. He does not have any smokeless tobacco history on file. His alcohol and drug histories are not on file.  Family History  Problem Relation Age of Onset  . Diabetes Neg Hx     No DM in immediate family    Medications: Patient's Medications  New Prescriptions   No medications on file  Previous Medications   AMLODIPINE (NORVASC) 5 MG TABLET    Take 5 mg by mouth daily.   ASPIRIN 81 MG TABLET    Take 81 mg by mouth daily.     BUPROPION  (WELLBUTRIN) 75 MG TABLET    Take 75 mg by mouth daily.   CITALOPRAM (CELEXA) 20 MG TABLET    Take 20 mg by mouth daily.     COAL TAR (NEUTROGENA T-GEL) 0.5 % SHAMPOO    Apply 1 application topically once a week.   DORZOLAMIDE-TIMOLOL (COSOPT) 22.3-6.8 MG/ML OPHTHALMIC SOLUTION    Place 1 drop into both eyes 2 (two) times daily.   FENOFIBRATE (TRICOR) 48 MG TABLET    Take 48 mg by mouth daily.     FOLIC ACID (FOLVITE) 1 MG TABLET    Take 1 mg by mouth daily.     FUROSEMIDE (LASIX) 40 MG TABLET    Take 40 mg by mouth 2 (two) times daily.    INSULIN ASPART (NOVOLOG) 100 UNIT/ML INJECTION    Inject 5 Units into the skin 3 (three) times daily before meals. Sliding scale   INSULIN GLARGINE (LANTUS) 100 UNIT/ML INJECTION    Inject 20 Units into the skin at bedtime.    ISOSORBIDE MONONITRATE (IMDUR) 60 MG 24 HR TABLET    Take 60 mg by mouth daily.     LAMOTRIGINE (LAMICTAL) 25 MG TABLET    Take 50 mg by mouth daily.   LATANOPROST (XALATAN) 0.005 % OPHTHALMIC SOLUTION    Place 1 drop into both eyes at bedtime.     LIDOCAINE (LIDODERM) 5 %    Place 1 patch onto the skin every 12 (twelve) hours. Remove & Discard patch within 12 hours  or as directed by MD    LISINOPRIL (PRINIVIL,ZESTRIL) 40 MG TABLET    Take 40 mg by mouth daily.     LORAZEPAM (ATIVAN) 0.5 MG TABLET    Take one tablet by mouth every 8 hours as needed   MAGNESIUM HYDROXIDE (MILK OF MAGNESIA) 400 MG/5ML SUSPENSION    Take by mouth daily as needed.     MEMANTINE (NAMENDA) 5 MG TABLET    Take 5 mg by mouth 2 (two) times daily.   METOPROLOL (LOPRESSOR) 100 MG TABLET    2 (two) times daily. 150mg  by mouth two times daily   MULTIPLE VITAMINS-MINERALS (DECUBI-VITE) CAPS    Take 2 capsules by mouth daily.     OXYBUTYNIN (DITROPAN-XL) 10 MG 24 HR TABLET    Take 10 mg by mouth at bedtime.     OXYCODONE (OXY-IR) 5 MG CAPSULE    Take 5 mg by mouth every 6 (six) hours as needed.    OXYCODONE (OXYCONTIN) 30 MG TB12    Take 30 mg by mouth 2 (two) times  daily.    POLYETHYL GLYCOL-PROPYL GLYCOL (SYSTANE ULTRA PF) 0.4-0.3 % SOLN    1 drop in each eye three times a day    POLYETHYLENE GLYCOL POWDER (GLYCOLAX/MIRALAX) POWDER    Take 17 g by mouth daily.     POTASSIUM CHLORIDE (K-DUR,KLOR-CON) 10 MEQ TABLET    Take 10 mEq by mouth daily.   RANITIDINE (ZANTAC) 150 MG TABLET    Take 150 mg by mouth daily.   SENNA (SENOKOT) 8.6 MG TABLET    Take 2 tablets by mouth at bedtime.    Modified Medications   No medications on file  Discontinued Medications   HYDRALAZINE (APRESOLINE) 25 MG TABLET    Take 25 mg by mouth every 6 (six) hours.     IPRATROPIUM (ATROVENT) 0.02 % NEBULIZER SOLUTION    Take 500 mcg by nebulization every 4 (four) hours as needed. For SOB    LACTULOSE 20 GM/30ML SOLN    30cc's every morning    LORATADINE (CLARITIN) 10 MG TABLET    1 tablet by mouth daily as needed for allergies   MENTHOL-CETYLPYRIDINIUM (CEPACOL) 3 MG LOZENGE    Take 1 lozenge by mouth as needed.     OLANZAPINE (ZYPREXA) 5 MG TABLET    Take 5 mg by mouth at bedtime.     PANTOPRAZOLE (PROTONIX) 40 MG TABLET    Take 40 mg by mouth daily.     SUCRALFATE (CARAFATE) 1 G TABLET    Take 1 g by mouth 2 (two) times daily.     TRIAMCINOLONE (KENALOG) 0.1 % CREAM    Apply topically daily. To both legs daily     Physical Exam:  Filed Vitals:   09/10/12 1548  BP: 155/64  Pulse: 58  Temp: 97.9 F (36.6 C)  Resp: 19  Height: 6\' 2"  (1.88 m)  Weight: 245 lb (111.131 kg)  SpO2: 96%   Constitutional: He appears well-developed and well-nourished. No distress.  HENT:   Head: Normocephalic and atraumatic.  Eyes: Pupils are equal, round, and reactive to light.  Neck: Neck supple. No JVD present.  Cardiovascular: Normal rate and regular rhythm.  Exam reveals no gallop and no friction rub.    No murmur heard. Pulmonary/Chest: Effort normal and breath sounds normal.  Abdominal: Soft. He exhibits no distension.  Neurological: He is alert.  Skin: Skin is warm and dry.   Psychiatric: gets agitated easily, refuses to make eye contact  Labs reviewed:  cbg- 98- 272 but mostly between 150-220  4/14 a1c 6.3  07/10/12 glu 167, bun 27, cr 1.92, rest wnl  Assessment/Plan  Chronic diastolic congestive heart failure Stable at present. Continue lasix 40 mg bid, imdur, lisinopril and metoprolol. Continue kcl supplement. Check bmp  HTN bp remains stable. Continue amlodipine, asa  Depression Persists, will increase bupropion to 75 mg bid for now and reassess. Continue citalopram  DM Continue current insulin regimen and monitor cbg, check a1c  Chronic pain Continue current pain regimen. No new changes  Dementia Continue namenda for now and monitor  Constipation Continue bowel regimen. Stable currently, no changes   Labs/tests ordered- weight, cbg, bmp, a1c, flp

## 2012-10-29 ENCOUNTER — Non-Acute Institutional Stay (SKILLED_NURSING_FACILITY): Payer: Medicare Other | Admitting: Internal Medicine

## 2012-10-29 DIAGNOSIS — F0150 Vascular dementia without behavioral disturbance: Secondary | ICD-10-CM | POA: Insufficient documentation

## 2012-10-29 DIAGNOSIS — L97422 Non-pressure chronic ulcer of left heel and midfoot with fat layer exposed: Secondary | ICD-10-CM

## 2012-10-29 DIAGNOSIS — K5900 Constipation, unspecified: Secondary | ICD-10-CM

## 2012-10-29 DIAGNOSIS — L97409 Non-pressure chronic ulcer of unspecified heel and midfoot with unspecified severity: Secondary | ICD-10-CM

## 2012-10-29 DIAGNOSIS — I10 Essential (primary) hypertension: Secondary | ICD-10-CM

## 2012-10-29 DIAGNOSIS — I509 Heart failure, unspecified: Secondary | ICD-10-CM

## 2012-10-29 DIAGNOSIS — E109 Type 1 diabetes mellitus without complications: Secondary | ICD-10-CM

## 2012-10-29 DIAGNOSIS — G8929 Other chronic pain: Secondary | ICD-10-CM

## 2012-10-29 DIAGNOSIS — E876 Hypokalemia: Secondary | ICD-10-CM

## 2012-10-29 DIAGNOSIS — G619 Inflammatory polyneuropathy, unspecified: Secondary | ICD-10-CM

## 2012-10-29 NOTE — Progress Notes (Signed)
[name], male   DOB: Mar 06, 1929, 79 y.o.   MRN: [id number]  [name] [place]   Code Status: DNR  No Known Allergies  Chief Complaint: annual exam  HPI:  His sugars have been elevated 160-400. His bp has also been elevated with SBP 140-160. He is taking his lantus and novolog. He is non complaint with his diet and eats sweets and chocolates. He is getting wound care for his left foot ulcer.he complaints of burning sensation in his feet and legs with discomfort. He refuses care at times but takes his medications as per nursing.his weight has been stable. He denies any other complaints this visit  Review of Systems: Constitutional: Negative for fever, chills and malaise/fatigue.   HENT: Negative for congestion.    Respiratory: Negative for cough and sputum production and shortness of breath Cardiovascular:  Negative for chest pain, palpitations, leg swelling, orthopnea and PND.   Gastrointestinal: Negative for constipation.   Genitourinary: Negative for dysuria.   Neurological: Negative for loss of consciousness and headaches.   Endo/Heme/Allergies:        Diabetes  Psychiatric/Behavioral: Positive for depression. Followed by psych services    Past Medical History  Diagnosis Date  . ANEMIA 01/15/2010  . DIABETIC FOOT ULCER 01/15/2010  . UNSPECIFIED PERIPHERAL VASCULAR DISEASE 01/15/2010  . BACK PAIN, LUMBAR 01/15/2010  . POLYNEUROPATHY 01/15/2010  . HYPONATREMIA 01/15/2010  . DEPRESSION 01/15/2010  . HYPERTENSION 01/15/2010  . 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 DYSLIPIDEMIA 01/15/2010  . HYPOKALEMIA 01/15/2010  . GERD 01/15/2010  . IDDM 01/15/2010   No past surgical history on file. Social History:   reports that he has quit smoking. He does not have any smokeless tobacco history on file. His alcohol and drug histories are not on file.  Family History  Problem Relation Age of Onset  . Diabetes Neg Hx     No DM in immediate family     Medications: Patient's Medications  New Prescriptions   No medications on file  Previous Medications   AMLODIPINE (NORVASC) 5 MG TABLET    Take 5 mg by mouth daily.   ASPIRIN 81 MG TABLET    Take 81 mg by mouth daily.     BUPROPION (WELLBUTRIN) 75 MG TABLET    Take 75 mg by mouth 2 (two) times daily.    CITALOPRAM (CELEXA) 20 MG TABLET    Take 20 mg by mouth daily.     COAL TAR (NEUTROGENA T-GEL) 0.5 % SHAMPOO    Apply 1 application topically once a week.   DORZOLAMIDE-TIMOLOL (COSOPT) 22.3-6.8 MG/ML OPHTHALMIC SOLUTION    Place 1 drop into both eyes 2 (two) times daily.   FENOFIBRATE (TRICOR) 48 MG TABLET    Take 48 mg by mouth daily.     FOLIC ACID (FOLVITE) 1 MG TABLET    Take 1 mg by mouth daily.     FUROSEMIDE (LASIX) 40 MG TABLET    Take 40 mg by mouth 2 (two) times daily.    INSULIN ASPART (NOVOLOG) 100 UNIT/ML INJECTION    Inject 5 Units into the skin 3 (three) times daily before meals. Sliding scale   INSULIN GLARGINE (LANTUS) 100 UNIT/ML INJECTION    Inject 20 Units into the skin at bedtime.    ISOSORBIDE MONONITRATE (IMDUR) 60 MG 24 HR TABLET    Take 60 mg by mouth daily.     LAMOTRIGINE (LAMICTAL) 25 MG TABLET    Take 50 mg by mouth daily.  LATANOPROST (XALATAN) 0.005 % OPHTHALMIC SOLUTION    Place 1 drop into both eyes at bedtime.     LIDOCAINE (LIDODERM) 5 %    Place 1 patch onto the skin every 12 (twelve) hours. Remove & Discard patch within 12 hours or as directed by MD    LISINOPRIL (PRINIVIL,ZESTRIL) 40 MG TABLET    Take 40 mg by mouth daily.     LORAZEPAM (ATIVAN) 0.5 MG TABLET    Take one tablet by mouth every 8 hours as needed   MAGNESIUM HYDROXIDE (MILK OF MAGNESIA) 400 MG/5ML SUSPENSION    Take by mouth daily as needed.     MEMANTINE (NAMENDA) 5 MG TABLET    Take 5 mg by mouth 2 (two) times daily.   METOPROLOL (LOPRESSOR) 100 MG TABLET    2 (two) times daily. 150mg  by mouth two times daily   MULTIPLE VITAMINS-MINERALS (DECUBI-VITE) CAPS    Take 2 capsules by  mouth daily.     OXYBUTYNIN (DITROPAN-XL) 10 MG 24 HR TABLET    Take 10 mg by mouth at bedtime.     OXYCODONE (OXY-IR) 5 MG CAPSULE    Take 5 mg by mouth every 6 (six) hours as needed.    OXYCODONE (OXYCONTIN) 30 MG TB12    Take 30 mg by mouth 2 (two) times daily.    POLYETHYLENE GLYCOL POWDER (GLYCOLAX/MIRALAX) POWDER    Take 17 g by mouth 2 (two) times daily.    POTASSIUM CHLORIDE (K-DUR,KLOR-CON) 10 MEQ TABLET    Take 10 mEq by mouth daily.   RANITIDINE (ZANTAC) 150 MG TABLET    Take 150 mg by mouth daily.   SENNA (SENOKOT) 8.6 MG TABLET    Take 2 tablets by mouth at bedtime.    Modified Medications   No medications on file  Discontinued Medications   POLYETHYL GLYCOL-PROPYL GLYCOL (SYSTANE ULTRA PF) 0.4-0.3 % SOLN    1 drop in each eye three times a day      Physical Exam: Filed Vitals:   10/29/12 1147  BP: 168/74  Pulse: 64  Temp: 97.6 F (36.4 C)  Resp: 18  Height: 6\' 2"  (1.88 m)  Weight: 245 lb (111.131 kg)  SpO2: 96%   Constitutional: He appears well-developed and well-nourished. Unkempt. No distress.   HENT:   Head: Normocephalic and atraumatic.   Eyes: Pupils are equal, round, and reactive to light.   Neck: Neck supple. No JVD present.   Cardiovascular: Normal rate and regular rhythm.  Exam reveals no gallop and no friction rub.    No murmur heard. Pulmonary/Chest: Effort normal and breath sounds normal.  no wheeze, rhonchi or crackles Abdominal: Soft. He exhibits no distension. Bowel sound present Neurological: He is alert and oriented to person and place. Skin: Skin is warm and dry. Has chronic venous stasis changes on both legs. Skin dry in feet. Has 2 x 2 cm open diabetic foot ulcer at bottom of left foot. Right heel wound is dried and healed Musculoskeletal: able to move all 4, can walk from bed to the bathroom otherwise uses wheelchair, no falls.  Psychiatric: calm this visit, mood appropriate   Labs reviewed:  4/14 a1c 6.3  07/10/12 glu 167, bun 27, cr  1.92, rest wnl  09/11/12 na 137, k 3.7, glu 136, bun 23, cr 1.80, ca 9.5, tg 182, hdl 22, ldl 53  10/12/12 a1c 6.3  Assessment/Plan  Dm type 2 Elevated blood sugar recently. a1c reviewed and at goal. Will continue lantus 20  u for now and have her novolog increased to 8 u for cbg > 200 for coverage. Dietary restriciton encouraged. Recheck a1c in 3 months. Check urine microalbumin  Left foot ulcer  diabetic foot ulcer with erythema around the ulcer base. With elevated sugar readings but normal a1c and erythema around the ulcer, will treat empirically with keflex 500 mg every 12 hours for 5 days and reassess. Continue monitoring cbg. Continue aspirin, lisinopril and fenofibrate  HTN bp has been elevated. Will increase amlodipine to 10 mg daily. Continue lopressor, lasix, lisinopril for now Chronic diastolic   congestive heart failure Stable at present. Weight remains stable and he is euvolemic. Continue lasix 40 mg bid, imdur, lisinopril and metoprolol. Continue kcl supplement. Bmp reviewed  Neuropathic pain- in setting of dm causing neuropathic damage. Will have him on gabapentin 100 mg bid for now  Chronic pain With pain under control, will change oxyIR to 5 mg every 8h prn and continue oxycontin.   Hypokalemia continue kcl supplement and monitor bmp periodically  Depression Improved. Continue buproprion, lamictal and celexa for now  Dementia Continue namenda for now and monitor  Constipation Continue bowel regimen. Stable currently, no changes  Labs/tests ordered- a1c, bmp in 3 months, urine microalbumin

## 2012-11-13 ENCOUNTER — Non-Acute Institutional Stay (SKILLED_NURSING_FACILITY): Payer: Medicare Other | Admitting: Internal Medicine

## 2012-11-13 DIAGNOSIS — I509 Heart failure, unspecified: Secondary | ICD-10-CM

## 2012-11-13 DIAGNOSIS — I5032 Chronic diastolic (congestive) heart failure: Secondary | ICD-10-CM

## 2012-11-13 DIAGNOSIS — E1165 Type 2 diabetes mellitus with hyperglycemia: Secondary | ICD-10-CM

## 2012-11-13 DIAGNOSIS — E1149 Type 2 diabetes mellitus with other diabetic neurological complication: Secondary | ICD-10-CM | POA: Insufficient documentation

## 2012-11-13 DIAGNOSIS — I10 Essential (primary) hypertension: Secondary | ICD-10-CM

## 2012-11-13 NOTE — Progress Notes (Signed)
[name], male   DOB: 10/26/1929, 79 y.o.   MRN: [id number]  Chief Complaint  Patient presents with  . Acute Visit    elevated morning blood sugar    No Known Allergies  HPI 79 y/o male patient seen today for elevated blood sugar reading mostly in morning and afternoon. cbg reviewed morning cbg 250-300s and also in 250- 330s in the afternoon. He is on lantus 20 u and novolog 5 u premeals. His SBP has been also running in 140-160 recently on review Patient denies any complaints. He continues to refuse hygiene care but takes his medications.   Review of Systems: Constitutional: Negative for fever, chills and malaise/fatigue.   HENT: Negative for congestion.    Respiratory: Negative for cough and sputum production and shortness of breath Cardiovascular:  Negative for chest pain, palpitations, leg swelling, orthopnea and PND.   Gastrointestinal: Negative for constipation.   Genitourinary: Negative for dysuria.   Neurological: Negative for loss of consciousness and headaches.   Endo/Heme/Allergies:        Diabetes   Past Medical History  Diagnosis Date  . ANEMIA 01/15/2010  . DIABETIC FOOT ULCER 01/15/2010  . UNSPECIFIED PERIPHERAL VASCULAR DISEASE 01/15/2010  . BACK PAIN, LUMBAR 01/15/2010  . POLYNEUROPATHY 01/15/2010  . HYPONATREMIA 01/15/2010  . DEPRESSION 01/15/2010  . HYPERTENSION 01/15/2010  . 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 DYSLIPIDEMIA 01/15/2010  . HYPOKALEMIA 01/15/2010  . GERD 01/15/2010  . IDDM 01/15/2010   Current Outpatient Prescriptions on File Prior to Visit  Medication Sig Dispense Refill  . amLODipine (NORVASC) 5 MG tablet Take 10 mg by mouth daily.       Marland Kitchen aspirin 81 MG tablet Take 81 mg by mouth daily.        Marland Kitchen buPROPion (WELLBUTRIN) 75 MG tablet Take 75 mg by mouth 2 (two) times daily.       . citalopram (CELEXA) 20 MG tablet Take 20 mg by mouth daily.        . coal tar (NEUTROGENA T-GEL) 0.5 % shampoo Apply 1 application  topically once a week.      . dorzolamide-timolol (COSOPT) 22.3-6.8 MG/ML ophthalmic solution Place 1 drop into both eyes 2 (two) times daily.      . fenofibrate (TRICOR) 48 MG tablet Take 48 mg by mouth daily.        . folic acid (FOLVITE) 1 MG tablet Take 1 mg by mouth daily.        . furosemide (LASIX) 40 MG tablet Take 40 mg by mouth 2 (two) times daily.       . insulin aspart (NOVOLOG) 100 UNIT/ML injection Inject 5 Units into the skin 3 (three) times daily before meals. Sliding scale      . insulin glargine (LANTUS) 100 UNIT/ML injection Inject 20 Units into the skin at bedtime.       . isosorbide mononitrate (IMDUR) 60 MG 24 hr tablet Take 60 mg by mouth daily.        Marland Kitchen lamoTRIgine (LAMICTAL) 25 MG tablet Take 50 mg by mouth daily.      Marland Kitchen latanoprost (XALATAN) 0.005 % ophthalmic solution Place 1 drop into both eyes at bedtime.        . lidocaine (LIDODERM) 5 % Place 1 patch onto the skin every 12 (twelve) hours. Remove & Discard patch within 12 hours or as directed by MD       . lisinopril (PRINIVIL,ZESTRIL) 40 MG tablet Take 40 mg by  mouth daily.        Marland Kitchen LORazepam (ATIVAN) 0.5 MG tablet Take one tablet by mouth every 8 hours as needed  90 tablet  0  . magnesium hydroxide (MILK OF MAGNESIA) 400 MG/5ML suspension Take by mouth daily as needed.        . memantine (NAMENDA) 5 MG tablet Take 5 mg by mouth 2 (two) times daily.      . metoprolol (LOPRESSOR) 100 MG tablet 2 (two) times daily. 150mg  by mouth two times daily      . Multiple Vitamins-Minerals (DECUBI-VITE) CAPS Take 2 capsules by mouth daily.        Marland Kitchen oxybutynin (DITROPAN-XL) 10 MG 24 hr tablet Take 10 mg by mouth at bedtime.        Marland Kitchen oxycodone (OXY-IR) 5 MG capsule Take 5 mg by mouth every 8 (eight) hours as needed.       Marland Kitchen oxycodone (OXYCONTIN) 30 MG TB12 Take 30 mg by mouth 2 (two) times daily.       . polyethylene glycol powder (GLYCOLAX/MIRALAX) powder Take 17 g by mouth 2 (two) times daily.       . potassium chloride  (K-DUR,KLOR-CON) 10 MEQ tablet Take 10 mEq by mouth daily.      . ranitidine (ZANTAC) 150 MG tablet Take 150 mg by mouth daily.      Marland Kitchen senna (SENOKOT) 8.6 MG tablet Take 2 tablets by mouth 2 (two) times daily.        No current facility-administered medications on file prior to visit.   No Known Allergies  Physical exam  BP 169/60  Pulse 56  Temp(Src) 97.1 F (36.2 C)  Resp 18  Ht 6\' 2"  (1.88 m)  Wt 245 lb (111.131 kg)  BMI 31.44 kg/m2  SpO2 96%  Constitutional: He appears well-developed and well-nourished. No distress.  unkempt HENT:   Head: Normocephalic and atraumatic.   Eyes: Pupils are equal, round, and reactive to light.   Neck: Neck supple. No JVD present.   Cardiovascular: Normal rate and regular rhythm.  Exam reveals no gallop and no friction rub.    No murmur heard. Pulmonary/Chest: Effort normal and breath sounds normal.   Abdominal: Soft. He exhibits no distension.  Neurological: He is alert.  Psychiatric: mood stable Skin: Skin is warm and dry. Has chronic venous stasis changes on both legs. Skin dry in feet. Has 2 x 2 cm open diabetic foot ulcer at bottom of left foot. Right heel wound is dried and healed Musculoskeletal: able to move all 4, can walk from bed to the bathroom otherwise uses wheelchair, no falls.  Psychiatric: calm this visit, mood appropriate   Labs reviewed:  4/14 a1c 6.3  07/10/12 glu 167, bun 27, cr 1.92, rest wnl  7/14 glu 136, na 137, k 3.7, bun 23, cr 1.8, tg 182, hdl 22, ldl 53, t.col 111  8/11 a1c 6.3  Assessment/Plan  Dm a1c 6.3 in ugust but has elevated blood sugar reading. Unclear about the a1c result at present.  Given his elevated blood sugar wll increase lantus to 23 u and premeal insulin to 8 u tid with meals for cbg > 200. Monitor cbg. Check a1c again for reconfirmation  Hypertension With elevated sugar and poorly controlled blood sugar, will adjust his anti-hypertensives. His amlodipine was increased last visit. Will  d/c lisinopril and have him on lisinopril-hctz 20-25 mg daily for now. Continue imdur and lopressor. Monitor bp readings  Chronic diastolic congestive heart failure Stable at  present. Continue lasix 40 mg bid, imdur, lisinopril and metoprolol. Continue kcl supplement. euvolemic mostly

## 2012-11-23 ENCOUNTER — Other Ambulatory Visit: Payer: Self-pay | Admitting: *Deleted

## 2012-11-23 MED ORDER — OXYCODONE HCL ER 30 MG PO T12A: 30.0000 mg | EXTENDED_RELEASE_TABLET | Freq: Two times a day (BID) | ORAL | Status: DC

## 2012-11-25 ENCOUNTER — Non-Acute Institutional Stay (SKILLED_NURSING_FACILITY): Payer: Medicare Other | Admitting: Adult Health

## 2012-11-25 DIAGNOSIS — H409 Unspecified glaucoma: Secondary | ICD-10-CM

## 2012-11-25 DIAGNOSIS — G8929 Other chronic pain: Secondary | ICD-10-CM

## 2012-11-25 DIAGNOSIS — E785 Hyperlipidemia, unspecified: Secondary | ICD-10-CM

## 2012-11-25 DIAGNOSIS — K5900 Constipation, unspecified: Secondary | ICD-10-CM

## 2012-11-25 DIAGNOSIS — F0150 Vascular dementia without behavioral disturbance: Secondary | ICD-10-CM

## 2012-11-25 DIAGNOSIS — K219 Gastro-esophageal reflux disease without esophagitis: Secondary | ICD-10-CM

## 2012-11-25 DIAGNOSIS — I5032 Chronic diastolic (congestive) heart failure: Secondary | ICD-10-CM

## 2012-11-25 DIAGNOSIS — I10 Essential (primary) hypertension: Secondary | ICD-10-CM

## 2012-11-25 DIAGNOSIS — E1165 Type 2 diabetes mellitus with hyperglycemia: Secondary | ICD-10-CM

## 2012-11-25 DIAGNOSIS — F329 Major depressive disorder, single episode, unspecified: Secondary | ICD-10-CM

## 2012-11-25 DIAGNOSIS — R32 Unspecified urinary incontinence: Secondary | ICD-10-CM

## 2012-11-25 DIAGNOSIS — I509 Heart failure, unspecified: Secondary | ICD-10-CM

## 2012-12-07 ENCOUNTER — Encounter: Payer: Self-pay | Admitting: Adult Health

## 2012-12-07 DIAGNOSIS — H409 Unspecified glaucoma: Secondary | ICD-10-CM | POA: Insufficient documentation

## 2012-12-07 DIAGNOSIS — R32 Unspecified urinary incontinence: Secondary | ICD-10-CM | POA: Insufficient documentation

## 2012-12-07 NOTE — Assessment & Plan Note (Signed)
Is presently stable will continue lantus 20 units nightly will continue novolog 8 units prior to meals with an additional 5 units for cbg >=200

## 2012-12-07 NOTE — Progress Notes (Signed)
[name], male   DOB: February 05, 1930, 79 y.o.   MRN: [id number]  [facility]  No Known Allergies   Chief Complaint  Patient presents with  . Medical Managment of Chronic Issues    HPI: He is being seen for the management of his chronic illnesses. He is not voicing any concerns at this time. The nursing staff is not voicing any concerns at this time. His status overall is without any significant change.   Past Medical History  Diagnosis Date  . ANEMIA 01/15/2010  . DIABETIC FOOT ULCER 01/15/2010  . UNSPECIFIED PERIPHERAL VASCULAR DISEASE 01/15/2010  . BACK PAIN, LUMBAR 01/15/2010  . POLYNEUROPATHY 01/15/2010  . HYPONATREMIA 01/15/2010  . DEPRESSION 01/15/2010  . HYPERTENSION 01/15/2010  . 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 DYSLIPIDEMIA 01/15/2010  . HYPOKALEMIA 01/15/2010  . GERD 01/15/2010  . IDDM 01/15/2010    No past surgical history on file.  VITAL SIGNS BP 148/60  Pulse 64  Ht 6\' 2"  (1.88 m)  Wt 246 lb (111.585 kg)  BMI 31.57 kg/m2   Patient's Medications  New Prescriptions   No medications on file  Previous Medications   AMLODIPINE (NORVASC) 5 MG TABLET    Take 10 mg by mouth daily.    ASPIRIN 81 MG TABLET    Take 81 mg by mouth daily.     BUPROPION (WELLBUTRIN) 75 MG TABLET    Take 75 mg by mouth 2 (two) times daily.    CITALOPRAM (CELEXA) 20 MG TABLET    Take 20 mg by mouth daily.     COAL TAR (NEUTROGENA T-GEL) 0.5 % SHAMPOO    Apply 1 application topically once a week.   DORZOLAMIDE-TIMOLOL (COSOPT) 22.3-6.8 MG/ML OPHTHALMIC SOLUTION    Place 1 drop into both eyes 2 (two) times daily.   FENOFIBRATE (TRICOR) 48 MG TABLET    Take 48 mg by mouth daily.     FOLIC ACID (FOLVITE) 1 MG TABLET    Take 1 mg by mouth daily.     FUROSEMIDE (LASIX) 40 MG TABLET    Take 40 mg by mouth 2 (two) times daily.    GABAPENTIN (NEURONTIN) 100 MG CAPSULE    Take 100 mg by mouth 2 (two) times daily.   INSULIN ASPART (NOVOLOG) 100 UNIT/ML INJECTION     Inject 8 Units into the skin 3 (three) times daily before meals. Give an additional 5 units prior to meals for cbg >200   INSULIN GLARGINE (LANTUS) 100 UNIT/ML INJECTION    Inject 20 Units into the skin at bedtime.    ISOSORBIDE MONONITRATE (IMDUR) 60 MG 24 HR TABLET    Take 60 mg by mouth daily.     LAMOTRIGINE (LAMICTAL) 25 MG TABLET    Take 50 mg by mouth daily.   LATANOPROST (XALATAN) 0.005 % OPHTHALMIC SOLUTION    Place 1 drop into both eyes at bedtime.     LIDOCAINE (LIDODERM) 5 %    Place 1 patch onto the skin every 12 (twelve) hours. Remove & Discard patch within 12 hours or as directed by MD    LISINOPRIL (PRINIVIL,ZESTRIL) 40 MG TABLET    Take 40 mg by mouth daily.     LORAZEPAM (ATIVAN) 0.5 MG TABLET    Take one tablet by mouth every 8 hours as needed   MAGNESIUM HYDROXIDE (MILK OF MAGNESIA) 400 MG/5ML SUSPENSION    Take by mouth daily as needed.     MEMANTINE (NAMENDA) 5 MG TABLET  Take 5 mg by mouth 2 (two) times daily.   METOPROLOL (LOPRESSOR) 100 MG TABLET    2 (two) times daily. 150mg  by mouth two times daily   MULTIPLE VITAMINS-MINERALS (DECUBI-VITE) CAPS    Take 2 capsules by mouth daily.     OXYBUTYNIN (DITROPAN-XL) 10 MG 24 HR TABLET    Take 10 mg by mouth at bedtime.     OXYCODONE (OXY-IR) 5 MG CAPSULE    Take 5 mg by mouth every 8 (eight) hours as needed.    OXYCODONE (OXYCONTIN) 30 MG TB12    Take 30 mg by mouth 2 (two) times daily.    OXYCODONE HCL ER (OXYCONTIN) 30 MG T12A    Take 30 mg by mouth 2 (two) times daily.   POLYETHYLENE GLYCOL POWDER (GLYCOLAX/MIRALAX) POWDER    Take 17 g by mouth 2 (two) times daily.    POTASSIUM CHLORIDE (K-DUR,KLOR-CON) 10 MEQ TABLET    Take 10 mEq by mouth daily.   RANITIDINE (ZANTAC) 150 MG TABLET    Take 150 mg by mouth daily.   SENNA (SENOKOT) 8.6 MG TABLET    Take 2 tablets by mouth 2 (two) times daily.   Modified Medications   No medications on file  Discontinued Medications   No medications on file    SIGNIFICANT DIAGNOSTIC  EXAMS    LABS REVIEWED;   07-10-12: glucose 167; bun 27; creat 1.92; k+3.6; na++ 139 08-28-12: urine culture: no growth 10-12-12: hgb a1c 6.3   Review of Systems  Constitutional: Negative for malaise/fatigue.  Respiratory: Negative for cough.   Cardiovascular: Negative for chest pain.  Gastrointestinal: Negative for heartburn and constipation.  Musculoskeletal: Negative for myalgias.  Skin: Negative.   Neurological: Negative for headaches.  Psychiatric/Behavioral: Negative for depression. The patient does not have insomnia.     Physical Exam  Constitutional: He appears well-developed and well-nourished.  obese  Neck: Neck supple. No JVD present.  Cardiovascular: Normal rate, regular rhythm and intact distal pulses.   Respiratory: Effort normal and breath sounds normal. No respiratory distress. He has no wheezes.  GI: Soft. Bowel sounds are normal. He exhibits no distension.  Musculoskeletal: He exhibits no edema.  Is able to move all extremities   Neurological: He is alert.  Skin: Skin is warm and dry.       ASSESSMENT/ PLAN:  HYPERTENSION His status is unchanged will continue lopressor 100 mg twice daily; norvasc 10 mg daily; lisinopril 40 mg daily asa 81 mg daily and will monitor his status   Chronic diastolic congestive heart failure Is stable wlil continue imdur 60 mg daily lasix 40 mg twice daily with k+ 10 meq daily and will monitor his status   GERD Will continue zantac 150 mg daily   Unspecified constipation Is stable will continue miralax 17 gm twice daily senna s tabs twice daily  Diabetes mellitus type 2 with complications, uncontrolled Is presently stable will continue lantus 20 units nightly will continue novolog 8 units prior to meals with an additional 5 units for cbg >=200  Vascular dementia No change in his overall status; will continue his  namenda 5 mg twice daily and will not make changes.   DYSLIPIDEMIA Will continue tricor 145 mg daily    DEPRESSION His emotional status is stable will continue celexa 20 mg daily wellbutrin 75 mg twice daily; will continue ativan 0.5 mg every 8 hours as needed for anxiety and will continue lamictal 50 mg daily to help stabilize mood;  and will continue  to monitor his status   Chronic pain His chronic pain is being managed; he is presently denying any pain will continue neurontin 100 mg twice daily oxycontin 30 mg twice daily; lidoderm to right neck and oxycodone 5 mg every 8 hours as needed and will monitor   Urinary incontinence Will continue ditropan xl 10 mg daily   Glaucoma Will continue latanoprost to both eyes nightly; and cosopt to both eyes three times daily     Will check cbc; cmp lipids  Time spent patient 50 minutes.

## 2012-12-07 NOTE — Assessment & Plan Note (Signed)
Is stable wlil continue imdur 60 mg daily lasix 40 mg twice daily with k+ 10 meq daily and will monitor his status

## 2012-12-07 NOTE — Assessment & Plan Note (Signed)
Will continue latanoprost to both eyes nightly; and cosopt to both eyes three times daily

## 2012-12-07 NOTE — Assessment & Plan Note (Signed)
His chronic pain is being managed; he is presently denying any pain will continue neurontin 100 mg twice daily oxycontin 30 mg twice daily; lidoderm to right neck and oxycodone 5 mg every 8 hours as needed and will monitor

## 2012-12-07 NOTE — Assessment & Plan Note (Signed)
Will continue ditropan xl 10 mg daily

## 2012-12-07 NOTE — Assessment & Plan Note (Signed)
No change in his overall status; will continue his  namenda 5 mg twice daily and will not make changes.

## 2012-12-07 NOTE — Assessment & Plan Note (Signed)
His status is unchanged will continue lopressor 100 mg twice daily; norvasc 10 mg daily; lisinopril 40 mg daily asa 81 mg daily and will monitor his status

## 2012-12-07 NOTE — Assessment & Plan Note (Signed)
Will continue zantac 150 mg daily  

## 2012-12-07 NOTE — Assessment & Plan Note (Signed)
Will continue tricor 145 mg daily  

## 2012-12-07 NOTE — Assessment & Plan Note (Signed)
Is stable will continue miralax 17 gm twice daily senna s tabs twice daily

## 2012-12-07 NOTE — Assessment & Plan Note (Signed)
His emotional status is stable will continue celexa 20 mg daily wellbutrin 75 mg twice daily; will continue ativan 0.5 mg every 8 hours as needed for anxiety and will continue lamictal 50 mg daily to help stabilize mood;  and will continue to monitor his status

## 2012-12-24 ENCOUNTER — Non-Acute Institutional Stay (SKILLED_NURSING_FACILITY): Payer: Medicare Other | Admitting: Internal Medicine

## 2012-12-24 DIAGNOSIS — F3289 Other specified depressive episodes: Secondary | ICD-10-CM

## 2012-12-24 DIAGNOSIS — I10 Essential (primary) hypertension: Secondary | ICD-10-CM

## 2012-12-24 DIAGNOSIS — IMO0002 Reserved for concepts with insufficient information to code with codable children: Secondary | ICD-10-CM

## 2012-12-24 DIAGNOSIS — F0280 Dementia in other diseases classified elsewhere without behavioral disturbance: Secondary | ICD-10-CM

## 2012-12-24 DIAGNOSIS — E1165 Type 2 diabetes mellitus with hyperglycemia: Secondary | ICD-10-CM

## 2012-12-24 DIAGNOSIS — F0150 Vascular dementia without behavioral disturbance: Secondary | ICD-10-CM

## 2012-12-24 DIAGNOSIS — G8929 Other chronic pain: Secondary | ICD-10-CM

## 2012-12-24 DIAGNOSIS — F329 Major depressive disorder, single episode, unspecified: Secondary | ICD-10-CM

## 2012-12-24 NOTE — Progress Notes (Signed)
[name], male   DOB: 07-08-1929, 79 y.o.   MRN: [id number]  [name]  Chief Complaint  Patient presents with  . Medical Managment of Chronic Issues   Code status- dnr  No Known Allergies  HPI: 79 y/o male patient is being seen for routine visit. He denies any complaints this visit. He ocassionaly refuses medications. No other concerns from staff. He is getting wound care for the  Wound in his heels.      Review of Systems  Constitutional: Negative for malaise/fatigue.  Respiratory: Negative for cough.   Cardiovascular: Negative for chest pain.  Gastrointestinal: Negative for heartburn and constipation.  Musculoskeletal: Negative for myalgias.    Neurological: Negative for headaches.  Psychiatric/Behavioral: Negative for depression. The patient does not have insomnia.    Past Medical History  Diagnosis Date  . ANEMIA 01/15/2010  . DIABETIC FOOT ULCER 01/15/2010  . UNSPECIFIED PERIPHERAL VASCULAR DISEASE 01/15/2010  . BACK PAIN, LUMBAR 01/15/2010  . POLYNEUROPATHY 01/15/2010  . HYPONATREMIA 01/15/2010  . DEPRESSION 01/15/2010  . HYPERTENSION 01/15/2010  . 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 DYSLIPIDEMIA 01/15/2010  . HYPOKALEMIA 01/15/2010  . GERD 01/15/2010  . IDDM 01/15/2010   No past surgical history on file.  Medication reviewed. See MAR  Physical Exam   BP 123/60  Pulse 84  Temp(Src) 98 F (36.7 C)  Resp 18  Ht 6\' 2"  (1.88 m)  Wt 245 lb (111.131 kg)  BMI 31.44 kg/m2  SpO2 96%  Constitutional: obese , in NAD Neck: Neck supple. No JVD present.  Cardiovascular: Normal rate, regular rhythm and intact distal pulses.   Respiratory: Effort normal and breath sounds normal. No respiratory distress. He has no wheezes.  GI: Soft. Bowel sounds are normal. He exhibits no distension.  Musculoskeletal: He exhibits no edema.  Is able to move all extremities   Neurological: He is alert.  Skin: Skin is warm and dry. Wound in both his  feet, dressing clean and dry  LABS REVIEWED;    07-10-12: glucose 167; bun 27; creat 1.92; k+3.6; na++ 139 08-28-12: urine culture: no growth 10-12-12: hgb a1c 6.3   ASSESSMENT/ PLAN:  Diabetes mellitus type 2 with complications, uncontrolled Sugar readings stable, continue lantus 20 units nightly and novolog 8 units prior to meals with an additional 5 units for cbg >=200  Vascular dementia Persists. He is on namenda 5 mg twice daily for now  hypertension Well controlled with lopressor 100 mg twice daily, norvasc 10 mg daily, lisinopril 40 mg daily asa 81 mg daily. Continue this and monitor his status   Depression related to dementia continue celexa 20 mg daily, wellbutrin 75 mg twice daily and ativan 0.5 mg every 8 hours as needed for anxiety. Also to continue lamictal 50 mg daily to help stabilize mood. Monitor clinically  Chronic diastolic congestive heart failure Euvolemic, continue imdur 60 mg daily and lasix 40 mg twice daily with k+ 10 meq daily   GERD continue zantac 150 mg daily   Chronic pain Managed with neurontin 100 mg twice daily, oxycontin 30 mg twice daily, lidoderm to right neck and oxycodone 5 mg every 8 hours as needed

## 2013-02-02 ENCOUNTER — Other Ambulatory Visit: Payer: Self-pay | Admitting: *Deleted

## 2013-02-02 MED ORDER — OXYBUTYNIN CHLORIDE ER 10 MG PO TB24: 10.0000 mg | ORAL_TABLET | Freq: Every day | ORAL | Status: DC

## 2013-02-02 MED ORDER — OXYCODONE HCL ER 30 MG PO T12A: EXTENDED_RELEASE_TABLET | ORAL | Status: DC

## 2013-02-17 ENCOUNTER — Encounter: Payer: Self-pay | Admitting: Internal Medicine

## 2013-02-17 ENCOUNTER — Non-Acute Institutional Stay (SKILLED_NURSING_FACILITY): Payer: Medicare Other | Admitting: Internal Medicine

## 2013-02-17 DIAGNOSIS — I10 Essential (primary) hypertension: Secondary | ICD-10-CM

## 2013-02-17 DIAGNOSIS — I5032 Chronic diastolic (congestive) heart failure: Secondary | ICD-10-CM

## 2013-02-17 DIAGNOSIS — E1159 Type 2 diabetes mellitus with other circulatory complications: Secondary | ICD-10-CM

## 2013-02-17 DIAGNOSIS — G8929 Other chronic pain: Secondary | ICD-10-CM

## 2013-02-17 DIAGNOSIS — E1151 Type 2 diabetes mellitus with diabetic peripheral angiopathy without gangrene: Secondary | ICD-10-CM

## 2013-02-17 DIAGNOSIS — I798 Other disorders of arteries, arterioles and capillaries in diseases classified elsewhere: Secondary | ICD-10-CM

## 2013-02-17 DIAGNOSIS — I119 Hypertensive heart disease without heart failure: Secondary | ICD-10-CM

## 2013-02-17 DIAGNOSIS — E1142 Type 2 diabetes mellitus with diabetic polyneuropathy: Secondary | ICD-10-CM

## 2013-02-17 DIAGNOSIS — K5909 Other constipation: Secondary | ICD-10-CM

## 2013-02-17 DIAGNOSIS — E1149 Type 2 diabetes mellitus with other diabetic neurological complication: Secondary | ICD-10-CM

## 2013-02-17 DIAGNOSIS — F1011 Alcohol abuse, in remission: Secondary | ICD-10-CM

## 2013-02-17 DIAGNOSIS — I509 Heart failure, unspecified: Secondary | ICD-10-CM

## 2013-02-17 DIAGNOSIS — F0150 Vascular dementia without behavioral disturbance: Secondary | ICD-10-CM

## 2013-02-17 DIAGNOSIS — K5903 Drug induced constipation: Secondary | ICD-10-CM

## 2013-02-17 NOTE — Progress Notes (Signed)
[name], male   DOB: 01-01-1930, 79 y.o.   MRN: [id number]  Location:  [facility] Provider:  Gwenith Spitz. Renato Gails, D.O., C.M.D.  Code Status:  DNR   Chief Complaint  Patient presents with  . Medical Managment of Chronic Issues    HPI:  79 yo white male with dementia, depression, seborrheic dermatitis, htn, hypertriglyceridemia, chf, glaucoma, folic acid deficiency, chronic pain and pain medicine induced constipation here for long term care was seen for medical mgt of his chronic diseases.  He is often resistant to care as a consequence of his advanced dementia.    Review of Systems:  Review of Systems  Constitutional: Negative for fever.  HENT: Negative for congestion.   Eyes: Positive for blurred vision.  Cardiovascular: Negative for chest pain and palpitations.  Gastrointestinal: Positive for constipation. Negative for abdominal pain.       Relieved with current regimen  Genitourinary: Negative for dysuria.  Musculoskeletal: Positive for back pain and joint pain.  Skin: Negative for rash.       Dry scaly skin  Neurological: Negative for dizziness and loss of consciousness.  Endo/Heme/Allergies: Bruises/bleeds easily.  Psychiatric/Behavioral: Positive for memory loss. The patient has insomnia.     Medications: Patient's Medications  New Prescriptions   No medications on file  Previous Medications   AMLODIPINE (NORVASC) 5 MG TABLET    Take 10 mg by mouth daily.    ASPIRIN 81 MG TABLET    Take 81 mg by mouth daily.     BUPROPION (WELLBUTRIN) 75 MG TABLET    Take 75 mg by mouth 2 (two) times daily.    CITALOPRAM (CELEXA) 20 MG TABLET    Take 20 mg by mouth daily.     COAL TAR (NEUTROGENA T-GEL) 0.5 % SHAMPOO    Apply 1 application topically once a week.   DORZOLAMIDE-TIMOLOL (COSOPT) 22.3-6.8 MG/ML OPHTHALMIC SOLUTION    Place 1 drop into both eyes 2 (two) times daily.   FENOFIBRATE (TRICOR) 48 MG TABLET    Take 48 mg by mouth daily.     FOLIC  ACID (FOLVITE) 1 MG TABLET    Take 1 mg by mouth daily.     FUROSEMIDE (LASIX) 40 MG TABLET    Take 40 mg by mouth 2 (two) times daily.    GABAPENTIN (NEURONTIN) 100 MG CAPSULE    Take 100 mg by mouth 2 (two) times daily.   INSULIN ASPART (NOVOLOG) 100 UNIT/ML INJECTION    Inject 8 Units into the skin 3 (three) times daily before meals. Give an additional 5 units prior to meals for cbg >200   INSULIN GLARGINE (LANTUS) 100 UNIT/ML INJECTION    Inject 20 Units into the skin at bedtime.    ISOSORBIDE MONONITRATE (IMDUR) 60 MG 24 HR TABLET    Take 60 mg by mouth daily.     LAMOTRIGINE (LAMICTAL) 25 MG TABLET    Take 50 mg by mouth daily.   LATANOPROST (XALATAN) 0.005 % OPHTHALMIC SOLUTION    Place 1 drop into both eyes at bedtime.     LIDOCAINE (LIDODERM) 5 %    Place 1 patch onto the skin every 12 (twelve) hours. Remove & Discard patch within 12 hours or as directed by MD    LISINOPRIL (PRINIVIL,ZESTRIL) 40 MG TABLET    Take 40 mg by mouth daily.     LORAZEPAM (ATIVAN) 0.5 MG TABLET    Take one tablet by mouth every 8 hours as  needed   MAGNESIUM HYDROXIDE (MILK OF MAGNESIA) 400 MG/5ML SUSPENSION    Take by mouth daily as needed.     MEMANTINE (NAMENDA) 5 MG TABLET    Take 5 mg by mouth 2 (two) times daily.   METOPROLOL (LOPRESSOR) 100 MG TABLET    2 (two) times daily. 150mg  by mouth two times daily   MULTIPLE VITAMINS-MINERALS (DECUBI-VITE) CAPS    Take 2 capsules by mouth daily.     OXYBUTYNIN (DITROPAN-XL) 10 MG 24 HR TABLET    Take 1 tablet (10 mg total) by mouth at bedtime.   OXYCODONE (OXY-IR) 5 MG CAPSULE    Take 5 mg by mouth every 8 (eight) hours as needed.    OXYCODONE (OXYCONTIN) 30 MG TB12    Take 30 mg by mouth 2 (two) times daily.    OXYCODONE HCL ER (OXYCONTIN) 30 MG T12A    Take one tablet by mouth every 12 hours for pain   POLYETHYLENE GLYCOL POWDER (GLYCOLAX/MIRALAX) POWDER    Take 17 g by mouth 2 (two) times daily.    POTASSIUM CHLORIDE (K-DUR,KLOR-CON) 10 MEQ TABLET    Take 10 mEq  by mouth daily.   RANITIDINE (ZANTAC) 150 MG TABLET    Take 150 mg by mouth daily.   SENNA (SENOKOT) 8.6 MG TABLET    Take 2 tablets by mouth 2 (two) times daily.   Modified Medications   No medications on file  Discontinued Medications   No medications on file    Physical Exam: Filed Vitals:   02/17/13 1324  BP: 140/51  Pulse: 46  Temp: 97.7 F (36.5 C)  Resp: 20  Height: 6\' 2"  (1.88 m)  Weight: 246 lb (111.585 kg)  Physical Exam  Constitutional: No distress.  White male with poor hygiene  Cardiovascular: Normal rate, regular rhythm, normal heart sounds and intact distal pulses.   Pulmonary/Chest: Effort normal and breath sounds normal. No respiratory distress.  Abdominal: Soft. Bowel sounds are normal. He exhibits no distension and no mass. There is no tenderness.  Musculoskeletal: He exhibits no edema and no tenderness.  Neurological: He is alert.  Oriented to person only  Skin: Skin is warm and dry. There is pallor.  Dry scaly skin  Psychiatric:  Irritable at times, did answer questions, but prefers to be left alone    Labs reviewed: 10/12/12:  hba1c 6.3 11/16/12:  hba1c 7.1 11/27/12:  Ap 94, ast 11, alt <8, alb 3.9, ca 9.4, tc 107, tg 194, HDL 20, LDL 48 01/13/13:  Na 137, K 4.7, BUN 46, cr 2.72, Ca 9.7  Assessment/Plan 1. Vascular dementia -late stage--often refuses care despite best efforts by nursing staff to get him to bathe regularly--he is incontinent -seems this is due to his background of living in a place w/o running water per staff -doubt he receives much benefit from namenda XR at this point--would consider d/c at future visit if ok with family at next care plan -would d/c oxybutynin due to lack of benefit with his incontinence and potential interference with cognition, other side effects   2. Chronic pain -in low back;  On longstanding narcotic regimen with oxycodone 30mg  po q12h and oxycodone 5mg  q8h prn plus lidoderm patch -in context of agitation,  care refusal, irritable behavior, would favor treating pain to see if this helps, also checking for constipation  3. Hypertensive heart disease -cont lopressor, lisinopril/hctz, amlodipine and baby asa  4. Constipation due to pain medication -cont senna s, milk of mag  5. Chronic diastolic congestive heart failure -cont lasix, f/u bmp -no recent acute exacerbations  6. Alcohol abuse, in remission -has had folic acid deficiency as a result, on supplement  7. DM (diabetes mellitus) type II controlled peripheral vascular disorder -prior diabetic foot ulcer -cont lantus with novolog -last hba1c satisfactory and next due in jan  8. Diabetic peripheral neuropathy associated with type 2 diabetes mellitus -cont low dose gabapentin  Family/ staff Communication: seen with unit supervisor  Labs/tests ordered:  Hba1c, bmp in jan, apr, July, oct

## 2013-02-19 ENCOUNTER — Other Ambulatory Visit: Payer: Self-pay | Admitting: *Deleted

## 2013-02-19 MED ORDER — LORAZEPAM 0.5 MG PO TABS: ORAL_TABLET | ORAL | Status: DC

## 2013-03-05 ENCOUNTER — Other Ambulatory Visit: Payer: Self-pay | Admitting: *Deleted

## 2013-03-05 MED ORDER — OXYCODONE HCL 5 MG PO CAPS: ORAL_CAPSULE | ORAL | Status: DC

## 2013-03-05 NOTE — Telephone Encounter (Signed)
rx filled per protocol  

## 2013-03-29 ENCOUNTER — Non-Acute Institutional Stay (SKILLED_NURSING_FACILITY): Payer: Medicare Other | Admitting: Internal Medicine

## 2013-03-29 ENCOUNTER — Encounter: Payer: Self-pay | Admitting: Internal Medicine

## 2013-03-29 DIAGNOSIS — F3289 Other specified depressive episodes: Secondary | ICD-10-CM

## 2013-03-29 DIAGNOSIS — R1013 Epigastric pain: Secondary | ICD-10-CM

## 2013-03-29 DIAGNOSIS — E876 Hypokalemia: Secondary | ICD-10-CM

## 2013-03-29 DIAGNOSIS — K219 Gastro-esophageal reflux disease without esophagitis: Secondary | ICD-10-CM

## 2013-03-29 DIAGNOSIS — IMO0002 Reserved for concepts with insufficient information to code with codable children: Secondary | ICD-10-CM

## 2013-03-29 DIAGNOSIS — E118 Type 2 diabetes mellitus with unspecified complications: Secondary | ICD-10-CM

## 2013-03-29 DIAGNOSIS — E1165 Type 2 diabetes mellitus with hyperglycemia: Secondary | ICD-10-CM

## 2013-03-29 DIAGNOSIS — F0150 Vascular dementia without behavioral disturbance: Secondary | ICD-10-CM

## 2013-03-29 DIAGNOSIS — I10 Essential (primary) hypertension: Secondary | ICD-10-CM

## 2013-03-29 DIAGNOSIS — F329 Major depressive disorder, single episode, unspecified: Secondary | ICD-10-CM

## 2013-03-29 DIAGNOSIS — G8929 Other chronic pain: Secondary | ICD-10-CM

## 2013-03-29 NOTE — Progress Notes (Signed)
Patient ID: [name] BurrowMelvin T Bonnet, male   DOB: [birth year], 80 y.o.   MRN: [id number]    [name]  Chief Complaint  Patient presents with  . Medical Managment of Chronic Issues   Code status- dnr  No Known Allergies  HPI 80 y/o male patient is being seen for routine visit. He complaints of abdominal discomfort with meals. He is on treatment for constipation.He is getting wound care for the wound in his heels.       Review of Systems  Constitutional: Negative for malaise/fatigue.   Respiratory: Negative for cough.    Cardiovascular: Negative for chest pain.   Gastrointestinal: Negative for nausea and vomiting, no diarrhea Musculoskeletal: Negative for myalgias.     Neurological: Negative for headaches.   Psychiatric/Behavioral: Negative for depression. The patient does not have insomnia.      Physical exam BP 134/78  Pulse 78  Temp(Src) 98 F (36.7 C)  Resp 18  Wt 246 lb (111.585 kg)  Constitutional: obese , in NAD Neck: Neck supple. No JVD present.   Cardiovascular: Normal rate, regular rhythm and intact distal pulses.    Respiratory: Effort normal and breath sounds normal. No respiratory distress. He has no wheezes.   GI: Soft. Bowel sounds are normal. He exhibits no distension. Epigastric tenderness on exam. No rebound tenderness. No guarding or rigidity Musculoskeletal: He exhibits no edema. Is able to move all extremities   Neurological: He is alert and oriented to person Skin: Skin is warm and dry. Wound in both his feet, dressing clean and dry  LABS REVIEWED  07-10-12: glucose 167; bun 27; creat 1.92; k+3.6; na++ 139 08-28-12: urine culture: no growth 10-12-12: hgb a1c 6.3 9-14 a1c 7.1 9-14 wbc 8.7, hb 10.6, hct 30.9, plt 161, na 137, k 4.3, glu 283, bun 33, cr 2.8, chol 107, tg 194, ldl 48  ASSESSMENT/ PLAN:  epigastric pain Has hx of anemia and new onset epigastric discomfort. Will rule out gi bleed. Check cbc, guaiac stool. Will start him on omeprazole  20 mg daily for now and continue h2 blocker. Other concern is for gastroparesis in setting of dm vs worsening gastritis  hypertension Well controlled with lopressor 150 mg twice daily, norvasc 10 mg daily, imdur 60 mg daily, lasix 40 mg bid, lisinopril -hctz 20-25mg  daily asa 81 mg daily. Hold aspirin for now until the h/h is resulted and reassess need for aspirin then  Depression related to dementia continue celexa 20 mg daily, wellbutrin 75 mg twice daily and ativan 0.5 mg every 8 hours as needed for anxiety. Monitor clinically  Diabetes mellitus type 2 with complications, uncontrolled a1c reviewed. Continue lantus 23 u nightly and novolog 8 units prior to meals. Monitor cbg. Concern for gastroparesis with his abdominal discomfort. Recheck a1c  Vascular dementia Persists. He is on namenda xr 14 mg daily  Hypokalemia Continue kcl for now, check bmp  GERD continue zantac 150 mg daily and introduce omeprazole 20 mg daily  Chronic pain Managed with neurontin 100 mg twice daily, oxycontin 30 mg twice daily, lidoderm to right neck and oxycodone 5 mg every 8 hours as needed   Labs- cbc, cmp, a1c

## 2013-04-02 ENCOUNTER — Other Ambulatory Visit: Payer: Self-pay | Admitting: *Deleted

## 2013-04-02 MED ORDER — OXYCODONE HCL ER 30 MG PO T12A: EXTENDED_RELEASE_TABLET | ORAL | Status: DC

## 2013-04-02 NOTE — Telephone Encounter (Signed)
[facility] [place] 

## 2013-05-12 ENCOUNTER — Other Ambulatory Visit: Payer: Self-pay | Admitting: *Deleted

## 2013-05-12 MED ORDER — OXYCODONE HCL ER 30 MG PO T12A: EXTENDED_RELEASE_TABLET | ORAL | Status: DC

## 2013-05-12 NOTE — Telephone Encounter (Signed)
[facility] [place] 

## 2013-06-01 ENCOUNTER — Other Ambulatory Visit: Payer: Self-pay | Admitting: *Deleted

## 2013-06-01 MED ORDER — LORAZEPAM 0.5 MG PO TABS: ORAL_TABLET | ORAL | Status: DC

## 2013-06-01 NOTE — Telephone Encounter (Signed)
[facility] [place] 

## 2013-06-08 ENCOUNTER — Other Ambulatory Visit: Payer: Self-pay | Admitting: *Deleted

## 2013-06-08 MED ORDER — OXYCODONE HCL ER 30 MG PO T12A: EXTENDED_RELEASE_TABLET | ORAL | Status: DC

## 2013-06-08 NOTE — Telephone Encounter (Signed)
[facility] [place] 

## 2013-06-25 ENCOUNTER — Non-Acute Institutional Stay (SKILLED_NURSING_FACILITY): Payer: Medicare Other | Admitting: Internal Medicine

## 2013-06-25 DIAGNOSIS — R040 Epistaxis: Secondary | ICD-10-CM

## 2013-06-25 DIAGNOSIS — I959 Hypotension, unspecified: Secondary | ICD-10-CM

## 2013-06-25 DIAGNOSIS — G9340 Encephalopathy, unspecified: Secondary | ICD-10-CM

## 2013-06-25 NOTE — Progress Notes (Signed)
[name], male   DOB: 15-Apr-1929, 80 y.o.   MRN: [id number]    [name] [place]  Chief Complaint  Patient presents with  . Acute Visit    confusion, throat discomfort, nose bleed    No Known Allergies  HPI 80 y/o male patient is seen today for acute visit. He has been somewhat more confused since yesterday as per nurse. He has hx of dementia  He has been trying to get out of the bed and chair by himself and is a high fall risk. He had a fall yesterday afternoon and went on his knees. He did not hit his head. He has a nose bleed last night- sudden onset. Has hx of nose bleed in past. No witnessed trauma to nose He is currently on bactrim for MRSA infection on his foot He complaints of discomfort in his throat with problem  No fever or chills cbg this am 231- his blood sugar is normally around this range  ROS Denies fever or chills Denies nausea, vomiting or abdominal pain Denies chest pain, palpitations Denies dysuria Has had bowel movement  Past Medical History  Diagnosis Date  . ANEMIA 01/15/2010  . DIABETIC FOOT ULCER 01/15/2010  . UNSPECIFIED PERIPHERAL VASCULAR DISEASE 01/15/2010  . BACK PAIN, LUMBAR 01/15/2010  . POLYNEUROPATHY 01/15/2010  . HYPONATREMIA 01/15/2010  . DEPRESSION 01/15/2010  . HYPERTENSION 01/15/2010  . 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 DYSLIPIDEMIA 01/15/2010  . HYPOKALEMIA 01/15/2010  . GERD 01/15/2010  . IDDM 01/15/2010   [facility]. [facility]  Physical exam BP 101/66  Pulse 53  Temp(Src) 97.9 F (36.6 C)  Resp 19  SpO2 97%  Constitutional: obese , in NAD Neck: Neck supple. No JVD present.  no lymphadenopathy Nose: old blood in both nares, no active bleed Thorat: moist mucus membrane, has a small blood clot in back of the throat otherwise normal oropharynx Cardiovascular: Normal rate, regular rhythm.    Respiratory: Effort normal and breath sounds normal. No respiratory distress. He has no  wheezes.   GI: Soft. Bowel sounds are normal. He exhibits no distension. No tenderness  Musculoskeletal: He exhibits no edema. Is able to move all extremities   Neurological: following simple commands, has some confusion Skin: Skin is warm and dry. Wound in both his feet, dressing clean and dry  LABS REVIEWED  07-10-12: glucose 167; bun 27; creat 1.92; k+3.6; na++ 139 08-28-12: urine culture: no growth 10-12-12: hgb a1c 6.3 9-14 a1c 7.1 9-14 wbc 8.7, hb 10.6, hct 30.9, plt 161, na 137, k 4.3, glu 283, bun 33, cr 2.8, chol 107, tg 194, ldl 48  ASSESSMENT/ PLAN:  Acute encephalopathy New onset. Is on antibiotic for MRSA infection. No fever or chills but being an elderly will rule out ongoing infection. Check cbc with diff and cmp. Appears euvolemic. Will also send u/a with c/s. Lungs sound clear. Has bp on lower side and this could be iatrogenic. Will adjust bp meds. See below. Fall precautions. Monitor for loose stool with concern for c.diff. Encourage hydration. Rule out electrolyte imbalance  Hypotension Will decrease amlodipine to 5 mg daily and lopressor to 150 mg po daily for now. Continue other bp meds and monitor bp readings q shift for next 5 days  Epistaxis Has had episodic epistaxis in past. No active bleed at present. Monitor clinically. Stop aspirin for 3 days and then resume. We are checking cbc pigastric pain  Labs- cbc with diff, cmp ua with cs

## 2013-06-29 ENCOUNTER — Encounter: Payer: Self-pay | Admitting: Internal Medicine

## 2013-06-29 ENCOUNTER — Non-Acute Institutional Stay (SKILLED_NURSING_FACILITY): Payer: Medicare Other | Admitting: Internal Medicine

## 2013-06-29 DIAGNOSIS — R410 Disorientation, unspecified: Secondary | ICD-10-CM

## 2013-06-29 DIAGNOSIS — R42 Dizziness and giddiness: Secondary | ICD-10-CM

## 2013-06-29 DIAGNOSIS — E118 Type 2 diabetes mellitus with unspecified complications: Secondary | ICD-10-CM

## 2013-06-29 DIAGNOSIS — IMO0002 Reserved for concepts with insufficient information to code with codable children: Secondary | ICD-10-CM

## 2013-06-29 DIAGNOSIS — R404 Transient alteration of awareness: Secondary | ICD-10-CM

## 2013-06-29 DIAGNOSIS — R3981 Functional urinary incontinence: Secondary | ICD-10-CM

## 2013-06-29 DIAGNOSIS — E1165 Type 2 diabetes mellitus with hyperglycemia: Secondary | ICD-10-CM

## 2013-06-29 NOTE — Progress Notes (Signed)
[name], male   DOB: 1929-04-28, 80 y.o.   MRN: [id number]  [facility] [place] Provider:  Gwenith Spitziffany L. Renato Gailseed, D.O., C.M.D.  Code Status:  DNR  Chief Complaint  Patient presents with  . Acute Visit    persistent confusion, fall, dizziness, review of labs  . Medical Management of Chronic Issues    HPI:  80 yo white male here for long term care was seen for acute visit due to persistent confusion and dizziness.  He was seen 3 days ago for similar symptoms after a fall with epistaxis.  He is on treatment for what appears to be a colonized wound (no wbc in culture) with MRSA and proteus with bactrim for 3 wks and florastor for a month starting 4/17.  When seen 3 days ago, his bp meds were reduced and bp now reasonable for his age and fall risk.  Cbc, cmp, ua c+s were done.    Review of Systems:  Review of Systems  HENT:       Recent epistaxis  Respiratory: Negative for shortness of breath.   Cardiovascular: Negative for chest pain.  Gastrointestinal: Negative for abdominal pain.  Musculoskeletal: Positive for falls.  Neurological: Positive for dizziness.  Psychiatric/Behavioral: Positive for memory loss.       Increased confusion    Medications: Patient's Medications  New Prescriptions   No medications on file  Previous Medications   AMLODIPINE (NORVASC) 5 MG TABLET    Take 10 mg by mouth daily.    ASPIRIN 81 MG TABLET    Take 81 mg by mouth daily.     BUPROPION (WELLBUTRIN) 75 MG TABLET    Take 75 mg by mouth 2 (two) times daily.    CITALOPRAM (CELEXA) 20 MG TABLET    Take 20 mg by mouth daily.     COAL TAR (NEUTROGENA T-GEL) 0.5 % SHAMPOO    Apply 1 application topically once a week.   DORZOLAMIDE-TIMOLOL (COSOPT) 22.3-6.8 MG/ML OPHTHALMIC SOLUTION    Place 1 drop into both eyes 2 (two) times daily.   FENOFIBRATE (TRICOR) 48 MG TABLET    Take 48 mg by mouth daily.     FUROSEMIDE (LASIX) 40 MG TABLET    Take 40 mg by mouth 2 (two) times daily.    GABAPENTIN (NEURONTIN) 100 MG CAPSULE    Take 100 mg by mouth 2 (two) times daily.   INSULIN ASPART (NOVOLOG) 100 UNIT/ML INJECTION    Inject 8 Units into the skin 3 (three) times daily before meals. Give an additional 5 units prior to meals for cbg >200   INSULIN GLARGINE (LANTUS) 100 UNIT/ML INJECTION    Inject 23 Units into the skin at bedtime.    ISOSORBIDE MONONITRATE (IMDUR) 60 MG 24 HR TABLET    Take 60 mg by mouth daily.     LATANOPROST (XALATAN) 0.005 % OPHTHALMIC SOLUTION    Place 1 drop into both eyes at bedtime.     LIDOCAINE (LIDODERM) 5 %    Place 1 patch onto the skin every 12 (twelve) hours. Remove & Discard patch within 12 hours or as directed by MD    LISINOPRIL-HYDROCHLOROTHIAZIDE (PRINZIDE,ZESTORETIC) 20-25 MG PER TABLET    Take 1 tablet by mouth daily.   MAGNESIUM HYDROXIDE (MILK OF MAGNESIA) 400 MG/5ML SUSPENSION    Take by mouth daily as needed.     MEMANTINE HCL ER 28 MG CP24    Take 28 mg by mouth daily. dementia  METOPROLOL (LOPRESSOR) 100 MG TABLET    Take 150 mg by mouth daily.    MULTIPLE VITAMINS-MINERALS (DECUBI-VITE) CAPS    Take 2 capsules by mouth daily.     OMEPRAZOLE (PRILOSEC) 20 MG CAPSULE    Take 20 mg by mouth daily.   OXYBUTYNIN (DITROPAN-XL) 10 MG 24 HR TABLET    Take 1 tablet (10 mg total) by mouth at bedtime.   OXYCODONE (OXY-IR) 5 MG CAPSULE    Take one tablet by mouth every 8 hours as needed for pain.   OXYCODONE HCL ER (OXYCONTIN) 30 MG T12A    Take one tablet by mouth every 12 hours for pain   POLYETHYLENE GLYCOL POWDER (GLYCOLAX/MIRALAX) POWDER    Take 17 g by mouth 2 (two) times daily.    POTASSIUM CHLORIDE (K-DUR,KLOR-CON) 10 MEQ TABLET    Take 10 mEq by mouth daily.   PROMETHAZINE (PHENERGAN) 25 MG TABLET    Take 25 mg by mouth every 6 (six) hours as needed for nausea or vomiting.   RANITIDINE (ZANTAC) 150 MG TABLET    Take 150 mg by mouth daily.   SACCHAROMYCES BOULARDII (FLORASTOR) 250 MG CAPSULE    Take 250 mg by mouth 2 (two) times daily.    SENNA (SENOKOT) 8.6 MG TABLET    Take 2 tablets by mouth 2 (two) times daily.    SULFAMETHOXAZOLE-TRIMETHOPRIM (BACTRIM DS) 800-160 MG PER TABLET    Take 1 tablet by mouth 2 (two) times daily.  Modified Medications   Modified Medication Previous Medication   LORAZEPAM (ATIVAN) 0.5 MG TABLET LORazepam (ATIVAN) 0.5 MG tablet      Take 0.5 mg by mouth every 8 (eight) hours as needed for anxiety. Take one tablet by mouth once daily    Take one tablet by mouth once daily  Discontinued Medications   FOLIC ACID (FOLVITE) 1 MG TABLET    Take 1 mg by mouth daily.     LAMOTRIGINE (LAMICTAL) 25 MG TABLET    Take 50 mg by mouth daily.   LISINOPRIL (PRINIVIL,ZESTRIL) 40 MG TABLET    Take 40 mg by mouth daily.     MEMANTINE (NAMENDA) 5 MG TABLET    Take 5 mg by mouth 2 (two) times daily.   OXYCODONE (OXYCONTIN) 30 MG TB12    Take 30 mg by mouth 2 (two) times daily.     Physical Exam: Filed Vitals:   06/29/13 1204  BP: 140/72  Pulse: 54  Temp: 96.3 F (35.7 C)  Resp: 14  Height: 6\' 2"  (1.88 m)  Weight: 245 lb (111.131 kg)   Physical Exam  Constitutional: No distress.  Cardiovascular: Normal rate, regular rhythm, normal heart sounds and intact distal pulses.   Pulmonary/Chest: Effort normal and breath sounds normal. No respiratory distress.  Abdominal: Soft. Bowel sounds are normal. He exhibits no distension and no mass. There is no tenderness.  Musculoskeletal: Normal range of motion.  Neurological: He is alert.  Confused, c/o dizziness      Labs reviewed: 03/31/13:  BUN 28, cr 1.97 06/14/13:  Wound left plantar foot culture:  No wbc, abundant gram neg rods, abundant gram positive cocci in pairs, no squamous epithelial cells, grew out MRSA and proteus mirabilis 06/25/13:  Na 140, K 4, BUN 67, cr 3.72, tb 0.5, ap 61, ast 18, alt 10, tp 7.1, alb 4,4, Ca 9.7, wbc 12, h/h 12.3, 34.1, plts 156  Assessment/Plan 1. Acute delirium stop oxybutynin (anticholinergic) and not helpful Stop phenergan,  change to zofran  if needs this med Begin NS at 80 cc/ hr x 2 liters and repeat bmp  After ivf complete   2. Dizziness and giddiness -due probably to anticholinergic meds as above and decreased volume status  3. Functional urinary incontinence -oxybutynin not helpful anyway per nursing staff and side effects are outweighing benefits at present stop oxybutynin (anticholinergic) and not helpful  4.  DMII uncontrolled with complications:  Check hba1c next draw  Family/ staff Communication: seen with unit supervisor  Goals of care: long term care resident DNR  Labs/tests ordered:  hba1c next draw

## 2013-07-02 ENCOUNTER — Other Ambulatory Visit: Payer: Self-pay | Admitting: *Deleted

## 2013-07-02 MED ORDER — OXYCODONE HCL ER 30 MG PO T12A: EXTENDED_RELEASE_TABLET | ORAL | Status: DC

## 2013-07-02 NOTE — Telephone Encounter (Signed)
Medication refill to [facility]. 

## 2013-07-18 DIAGNOSIS — I119 Hypertensive heart disease without heart failure: Secondary | ICD-10-CM | POA: Insufficient documentation

## 2013-07-18 DIAGNOSIS — E1151 Type 2 diabetes mellitus with diabetic peripheral angiopathy without gangrene: Secondary | ICD-10-CM | POA: Insufficient documentation

## 2013-07-18 DIAGNOSIS — K5903 Drug induced constipation: Secondary | ICD-10-CM | POA: Insufficient documentation

## 2013-08-12 ENCOUNTER — Non-Acute Institutional Stay (SKILLED_NURSING_FACILITY): Payer: Medicare Other | Admitting: Internal Medicine

## 2013-08-12 DIAGNOSIS — G894 Chronic pain syndrome: Secondary | ICD-10-CM

## 2013-08-12 DIAGNOSIS — F329 Major depressive disorder, single episode, unspecified: Secondary | ICD-10-CM

## 2013-08-12 DIAGNOSIS — I10 Essential (primary) hypertension: Secondary | ICD-10-CM

## 2013-08-12 DIAGNOSIS — IMO0002 Reserved for concepts with insufficient information to code with codable children: Secondary | ICD-10-CM

## 2013-08-12 DIAGNOSIS — D72829 Elevated white blood cell count, unspecified: Secondary | ICD-10-CM

## 2013-08-12 DIAGNOSIS — E1165 Type 2 diabetes mellitus with hyperglycemia: Secondary | ICD-10-CM

## 2013-08-12 DIAGNOSIS — F0280 Dementia in other diseases classified elsewhere without behavioral disturbance: Secondary | ICD-10-CM

## 2013-08-12 DIAGNOSIS — F0150 Vascular dementia without behavioral disturbance: Secondary | ICD-10-CM

## 2013-08-12 DIAGNOSIS — F3289 Other specified depressive episodes: Secondary | ICD-10-CM

## 2013-08-12 DIAGNOSIS — F0393 Unspecified dementia, unspecified severity, with mood disturbance: Secondary | ICD-10-CM

## 2013-08-12 DIAGNOSIS — N184 Chronic kidney disease, stage 4 (severe): Secondary | ICD-10-CM

## 2013-08-12 DIAGNOSIS — K219 Gastro-esophageal reflux disease without esophagitis: Secondary | ICD-10-CM

## 2013-08-12 DIAGNOSIS — D638 Anemia in other chronic diseases classified elsewhere: Secondary | ICD-10-CM

## 2013-08-12 DIAGNOSIS — E118 Type 2 diabetes mellitus with unspecified complications: Secondary | ICD-10-CM

## 2013-08-12 NOTE — Progress Notes (Signed)
[name], male   DOB: March 13, 1929, 80 y.o.   MRN: [id number]   [facility]  Chief Complaint  Patient presents with  . Medical Management of Chronic Issues    complaining of pain of shoulders and back    No Known Allergies  Code status- DNR  HPI 80 y/o male patient is being seen for routine visit. He complains of pain of both shoulders and back. Rate pain as 8/10. He is getting wound care for the diabetic ulcer in his left foot. Staff have no concern this visit.       Review of Systems  Constitutional: Negative for malaise/fatigue.   Respiratory: Negative for cough.    Cardiovascular: Negative for chest pain.   Gastrointestinal: Negative for nausea and vomiting, no diarrhea Musculoskeletal: Negative for myalgias.     Neurological: Negative for headaches.   Psychiatric/Behavioral: Negative for depression. The patient does not have insomnia.    Past Medical History  Diagnosis Date  . ANEMIA 01/15/2010  . DIABETIC FOOT ULCER 01/15/2010  . UNSPECIFIED PERIPHERAL VASCULAR DISEASE 01/15/2010  . BACK PAIN, LUMBAR 01/15/2010  . POLYNEUROPATHY 01/15/2010  . HYPONATREMIA 01/15/2010  . DEPRESSION 01/15/2010  . HYPERTENSION 01/15/2010  . 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 DYSLIPIDEMIA 01/15/2010  . HYPOKALEMIA 01/15/2010  . GERD 01/15/2010  . IDDM 01/15/2010   No past surgical history on file. Outpatient Encounter Prescriptions as of 08/12/2013  Medication Sig  . fenofibrate (TRICOR) 145 MG tablet Take 145 mg by mouth daily.  . ondansetron (ZOFRAN) 4 MG tablet Take 4 mg by mouth every 6 (six) hours as needed for nausea or vomiting.  . ranitidine (ZANTAC) 150 MG tablet Take 150 mg by mouth daily.  Marland Kitchen amLODipine (NORVASC) 5 MG tablet Take 5 mg by mouth daily.   Marland Kitchen aspirin 81 MG tablet Take 81 mg by mouth daily.    Marland Kitchen buPROPion (WELLBUTRIN) 75 MG tablet Take 75 mg by mouth 2 (two) times daily.   . citalopram (CELEXA) 20 MG tablet Take 20 mg by mouth  daily.    . coal tar (NEUTROGENA T-GEL) 0.5 % shampoo Apply 1 application topically once a week.  . dorzolamide-timolol (COSOPT) 22.3-6.8 MG/ML ophthalmic solution Place 1 drop into both eyes 2 (two) times daily.  . furosemide (LASIX) 40 MG tablet Take 40 mg by mouth 2 (two) times daily.   Marland Kitchen gabapentin (NEURONTIN) 100 MG capsule Take 100 mg by mouth 2 (two) times daily.  . insulin aspart (NOVOLOG) 100 UNIT/ML injection Inject 8 Units into the skin 3 (three) times daily before meals. Give an additional 5 units prior to meals for cbg >200  . insulin glargine (LANTUS) 100 UNIT/ML injection Inject 23 Units into the skin at bedtime.   . isosorbide mononitrate (IMDUR) 60 MG 24 hr tablet Take 60 mg by mouth daily.    Marland Kitchen latanoprost (XALATAN) 0.005 % ophthalmic solution Place 1 drop into both eyes at bedtime.    . lidocaine (LIDODERM) 5 % Place 1 patch onto the skin every 12 (twelve) hours. Remove & Discard patch within 12 hours or as directed by MD   . lisinopril-hydrochlorothiazide (PRINZIDE,ZESTORETIC) 20-25 MG per tablet Take 1 tablet by mouth daily.  Marland Kitchen LORazepam (ATIVAN) 0.5 MG tablet Take 0.5 mg by mouth every 8 (eight) hours as needed for anxiety. Take one tablet by mouth once daily  . magnesium hydroxide (MILK OF MAGNESIA) 400 MG/5ML suspension Take by mouth daily as needed.    . Memantine  HCl ER 28 MG CP24 Take 28 mg by mouth daily. dementia  . metoprolol (LOPRESSOR) 100 MG tablet Take 150 mg by mouth daily.   . Multiple Vitamins-Minerals (DECUBI-VITE) CAPS Take 2 capsules by mouth daily.    Marland Kitchen omeprazole (PRILOSEC) 20 MG capsule Take 20 mg by mouth daily.  Marland Kitchen oxybutynin (DITROPAN-XL) 10 MG 24 hr tablet Take 1 tablet (10 mg total) by mouth at bedtime.  Marland Kitchen oxycodone (OXY-IR) 5 MG capsule Take one tablet by mouth every 8 hours as needed for pain.  . OxyCODONE HCl ER (OXYCONTIN) 30 MG T12A Take one tablet by mouth every 12 hours for pain  . polyethylene glycol powder (GLYCOLAX/MIRALAX) powder Take 17 g  by mouth 2 (two) times daily.   . potassium chloride (K-DUR,KLOR-CON) 10 MEQ tablet Take 10 mEq by mouth daily.  Marland Kitchen senna (SENOKOT) 8.6 MG tablet Take 2 tablets by mouth 2 (two) times daily.   . [DISCONTINUED] fenofibrate (TRICOR) 48 MG tablet Take 48 mg by mouth daily.    . [DISCONTINUED] promethazine (PHENERGAN) 25 MG tablet Take 25 mg by mouth every 6 (six) hours as needed for nausea or vomiting.  . [DISCONTINUED] ranitidine (ZANTAC) 150 MG tablet Take 150 mg by mouth daily.       Physical Exam BP 131/79  Pulse 54  Temp(Src) 97.6 F (36.4 C)  Resp 18  Ht 6' 2"  (1.88 m)  Wt 245 lb (111.131 kg)  BMI 31.44 kg/m2  Constitutional: obese , in NAD Neck: Neck supple. No JVD present.   Cardiovascular: Normal rate, regular rhythm and intact distal pulses.    Respiratory: Effort normal and breath sounds normal. No respiratory distress. He has no wheezes.   GI: Soft. Bowel sounds are normal. He exhibits no distension. No tenderness with palpation. No rebound tenderness. No guarding or rigidity Musculoskeletal: He exhibits no edema. Is able to move all extremities. No pain illicited on exam on both shoulders Neurological: He is alert and oriented to person and season Skin: Skin is warm and dry. Wound in left foot (upper lateral plantar), dressing clean and dry. Wound bed is clean, no drainage.   Labs: 07-10-12: glucose 167; bun 27; creat 1.92; k+3.6; na++ 139 08-28-12: urine culture: no growth 10-12-12: hgb a1c 6.3 9-14 a1c 7.1 9-14 wbc 8.7, hb 10.6, hct 30.9, plt 161, na 137, k 4.3, glu 283, bun 33, cr 2.8, chol 107, tg 194, ldl 48 06/25/13: wbc 12.0, Hgb 12.3, Hct 34.1, MCV 12.3, Plt 156, Na 140, K 4.0, Cl 97, Glucose 130, BUN 67, Cr 3.72, eGFR 14 07/16/13: HgbA1C 7.2  ASSESSMENT/ PLAN:  1. GERD (gastroesophageal reflux disease) Continue omeprazole 75m PO QD and ranitidine 155mPO QD. Symptoms currently controlled  2. Type II or unspecified type diabetes mellitus with unspecified  complication, uncontrolled Last HgbA1C 7.2. Continue current regimen with Lantus 23units daily and insulin aspart 8 units SQ before meals. Monitor cbg. Hold off lisinopril with worsening renal function  3. Depression due to dementia Continue celexa 20 mg daily, wellbutrin 75 mg twice daily and ativan 0.5 mg every 8 hours as needed for anxiety. Monitor clinically  4. Vascular dementia Persists. He is on namenda xr 14 mg daily  5. Chronic pain syndrome Managed with neurontin 100 mg twice daily, oxycontin 30 mg twice daily, lidoderm to right neck and oxycodone 5 mg every 8 hours as needed   6. Chronic kidney disease (CKD), stage IV (severe) Renal consult for progressively worsening kidney function. Check CMP, PTH, and Vit  D at next lab draw. Discontinue lisinopril/hctz secondary to decreased renal function. Avoid NSAIDS  7. Anemia, chronic disease Most likely due to chronic kidney disease. Check CBC and erythropoietin level at next lab draw, if erythropoetin level is low, can consider epo injection   8. Unspecified essential hypertension Well controlled with current regimen: lopressor 150 mg daily, norvasc 5 mg daily, imdur 60 mg daily, lasix 40 mg bid, lisinopril -hctz 20-74m daily. Discontinue lisinopril/hctz in the setting of CKD. Will monitor BP daily.   9. Leukocytosis Unclear cause. No left shift, afebrile. Recheck CBC with diff at next lab draw   Labs: CBC with diff, CMP, PTH, vit D, erythropoietin levels  Plan discuss with nursing staff. Staff verbalize understanding and agree with plan of care

## 2013-09-02 ENCOUNTER — Non-Acute Institutional Stay (SKILLED_NURSING_FACILITY): Payer: Medicare Other | Admitting: Internal Medicine

## 2013-09-02 DIAGNOSIS — I959 Hypotension, unspecified: Secondary | ICD-10-CM

## 2013-09-02 DIAGNOSIS — R001 Bradycardia, unspecified: Secondary | ICD-10-CM

## 2013-09-02 DIAGNOSIS — I498 Other specified cardiac arrhythmias: Secondary | ICD-10-CM

## 2013-09-02 NOTE — Progress Notes (Signed)
[name], male   DOB: 20-May-1929, 80 y.o.   MRN: [id number]    [facility]  Chief Complaint  Patient presents with  . Acute Visit    low hear rate and bp readings   No Known Allergies  HPI 80 y/o male patient is seen for AV His heart rate has been running on lower side of normal with most of them between 50-60 and he mentions feeling tired. bp 110-150/ 55-80 on review Reviewed his meds and labs  Review of Systems  Constitutional: Negative for fever and chills Respiratory: Negative for cough.    Cardiovascular: Negative for chest pain.   Gastrointestinal: Negative for nausea and vomiting, no diarrhea Musculoskeletal: Negative for myalgias.     Neurological: Negative for headaches.   Psychiatric/Behavioral: Negative for depression. The patient does not have insomnia.     Past Medical History  Diagnosis Date  . ANEMIA 01/15/2010  . DIABETIC FOOT ULCER 01/15/2010  . UNSPECIFIED PERIPHERAL VASCULAR DISEASE 01/15/2010  . BACK PAIN, LUMBAR 01/15/2010  . POLYNEUROPATHY 01/15/2010  . HYPONATREMIA 01/15/2010  . DEPRESSION 01/15/2010  . HYPERTENSION 01/15/2010  . 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 DYSLIPIDEMIA 01/15/2010  . HYPOKALEMIA 01/15/2010  . GERD 01/15/2010  . IDDM 01/15/2010   Current Outpatient Prescriptions on File Prior to Visit  Medication Sig Dispense Refill  . amLODipine (NORVASC) 5 MG tablet Take 5 mg by mouth daily.       Marland Kitchen. aspirin 81 MG tablet Take 81 mg by mouth daily.        Marland Kitchen. buPROPion (WELLBUTRIN) 75 MG tablet Take 75 mg by mouth 2 (two) times daily.       . citalopram (CELEXA) 20 MG tablet Take 20 mg by mouth daily.        . coal tar (NEUTROGENA T-GEL) 0.5 % shampoo Apply 1 application topically once a week.      . dorzolamide-timolol (COSOPT) 22.3-6.8 MG/ML ophthalmic solution Place 1 drop into both eyes 2 (two) times daily.      . fenofibrate (TRICOR) 145 MG tablet Take 145 mg by mouth daily.      .  furosemide (LASIX) 40 MG tablet Take 40 mg by mouth 2 (two) times daily.       Marland Kitchen. gabapentin (NEURONTIN) 100 MG capsule Take 100 mg by mouth 2 (two) times daily.      . insulin aspart (NOVOLOG) 100 UNIT/ML injection Inject 8 Units into the skin 3 (three) times daily before meals. Give an additional 5 units prior to meals for cbg >200      . insulin glargine (LANTUS) 100 UNIT/ML injection Inject 23 Units into the skin at bedtime.       . isosorbide mononitrate (IMDUR) 60 MG 24 hr tablet Take 60 mg by mouth daily.        Marland Kitchen. latanoprost (XALATAN) 0.005 % ophthalmic solution Place 1 drop into both eyes at bedtime.        . lidocaine (LIDODERM) 5 % Place 1 patch onto the skin every 12 (twelve) hours. Remove & Discard patch within 12 hours or as directed by MD       . lisinopril-hydrochlorothiazide (PRINZIDE,ZESTORETIC) 20-25 MG per tablet Take 1 tablet by mouth daily.      Marland Kitchen. LORazepam (ATIVAN) 0.5 MG tablet Take 0.5 mg by mouth every 8 (eight) hours as needed for anxiety. Take one tablet by mouth once daily      . magnesium hydroxide (MILK OF  MAGNESIA) 400 MG/5ML suspension Take by mouth daily as needed.        . Memantine HCl ER 28 MG CP24 Take 28 mg by mouth daily. dementia      . metoprolol (LOPRESSOR) 100 MG tablet Take 150 mg by mouth daily.       . Multiple Vitamins-Minerals (DECUBI-VITE) CAPS Take 2 capsules by mouth daily.        Marland Kitchen. omeprazole (PRILOSEC) 20 MG capsule Take 20 mg by mouth daily.      . ondansetron (ZOFRAN) 4 MG tablet Take 4 mg by mouth every 6 (six) hours as needed for nausea or vomiting.      Marland Kitchen. oxybutynin (DITROPAN-XL) 10 MG 24 hr tablet Take 1 tablet (10 mg total) by mouth at bedtime.  30 tablet  5  . oxycodone (OXY-IR) 5 MG capsule Take one tablet by mouth every 8 hours as needed for pain.  90 capsule  0  . OxyCODONE HCl ER (OXYCONTIN) 30 MG T12A Take one tablet by mouth every 12 hours for pain  60 each  0  . polyethylene glycol powder (GLYCOLAX/MIRALAX) powder Take 17 g by  mouth 2 (two) times daily.       . potassium chloride (K-DUR,KLOR-CON) 10 MEQ tablet Take 10 mEq by mouth daily.      . ranitidine (ZANTAC) 150 MG tablet Take 150 mg by mouth daily.      Marland Kitchen. senna (SENOKOT) 8.6 MG tablet Take 2 tablets by mouth 2 (two) times daily.       . [DISCONTINUED] oxybutynin (DITROPAN-XL) 10 MG 24 hr tablet Take 10 mg by mouth at bedtime.         No current facility-administered medications on file prior to visit.   Physical exam BP 110/56  Pulse 55  Temp(Src) 97 F (36.1 C)  Resp 16  Constitutional: obese , in NAD Neck: Neck supple. No JVD present.   Cardiovascular: Normal rate, regular rhythm and intact distal pulses.    Respiratory: Effort normal and breath sounds normal. No respiratory distress. He has no wheezes.   GI: Soft. Bowel sounds are normal.  Musculoskeletal: able to move all extremities, trace edema Neurological: He is alert and oriented to person Skin: Skin is warm and dry. Wound in both his feet, dressing clean and dry  LABS REVIEWED 10-12-12: hgb a1c 6.3 9-14 a1c 7.1 9-14 wbc 8.7, hb 10.6, hct 30.9, plt 161, na 137, k 4.3, glu 283, bun 33, cr 2.8, chol 107, tg 194, ldl 48 06/25/13 bun 67, cr 9.603.72, glu 130, na 140, k 4.0 07/16/13 a1c 7.2 09/01/13 na 140, k 3.7, glu 224, bun 48, cr 2.38, ca 9.2, a1c 6.5  ASSESSMENT/ PLAN:  Hypotension Likely iatrogenic. Change lopressor from 150 mg a day to metoprolol succinate 50 mg bid for now. Continue norvasc 5 mg daily, lasix 40 mg bid, imdur 60 mg daily for now and monitor. If bp remains on lower side, will discontinue amlodipine next. Monitor vital signs q shift and reassess  Bradycardia Likely iatrogenic at present. On lopressor 150 mg a day. Will d/c this and have him on metoprolol succinate 50 mg bid and reassess. If no improvement with reduction of dosage, will get ekg to rule out AV block

## 2013-09-28 ENCOUNTER — Non-Acute Institutional Stay (SKILLED_NURSING_FACILITY): Payer: Medicare Other | Admitting: Internal Medicine

## 2013-09-28 ENCOUNTER — Encounter: Payer: Self-pay | Admitting: Internal Medicine

## 2013-09-28 DIAGNOSIS — E1165 Type 2 diabetes mellitus with hyperglycemia: Secondary | ICD-10-CM

## 2013-09-28 DIAGNOSIS — E118 Type 2 diabetes mellitus with unspecified complications: Principal | ICD-10-CM

## 2013-09-28 DIAGNOSIS — IMO0002 Reserved for concepts with insufficient information to code with codable children: Secondary | ICD-10-CM

## 2013-09-28 NOTE — Progress Notes (Signed)
[name], male   DOB: 11-09-1929, 80 y.o.   MRN: [id number]  [facility] Provider:  Gwenith Spitziffany L. Renato Gailseed, D.O., C.M.D.  Code Status:  DNR  Chief Complaint  Patient presents with  . Acute Visit    elevated CBGs    HPI:  80 yo white male with h/o DMII on long acting insulin with meal coverage, htn, hyperlipidemia, depression and vascular dementia here for long term care was noted to have elevated CBGs by nursing and I was asked to adjust his medications.  He is on a CONCHO NAS diet. His CBGs are running from 150-357 and seem to be highest around lunch hour.    Review of Systems:  Review of Systems  Constitutional: Negative for fever.  Respiratory: Negative for shortness of breath.   Cardiovascular: Negative for chest pain.  Gastrointestinal: Positive for constipation.  Musculoskeletal: Negative for falls.  Neurological: Negative for loss of consciousness.  Psychiatric/Behavioral: Positive for memory loss.    Medications: Patient's Medications  New Prescriptions   No medications on file  Previous Medications   AMLODIPINE (NORVASC) 5 MG TABLET    Take 5 mg by mouth daily.    ASPIRIN 81 MG TABLET    Take 81 mg by mouth daily.     BUPROPION (WELLBUTRIN) 75 MG TABLET    Take 75 mg by mouth 2 (two) times daily.    CITALOPRAM (CELEXA) 20 MG TABLET    Take 20 mg by mouth daily.     COAL TAR (NEUTROGENA T-GEL) 0.5 % SHAMPOO    Apply 1 application topically once a week.   DORZOLAMIDE-TIMOLOL (COSOPT) 22.3-6.8 MG/ML OPHTHALMIC SOLUTION    Place 1 drop into both eyes 2 (two) times daily.   FENOFIBRATE (TRICOR) 145 MG TABLET    Take 145 mg by mouth daily.   FUROSEMIDE (LASIX) 40 MG TABLET    Take 40 mg by mouth 2 (two) times daily.    GABAPENTIN (NEURONTIN) 100 MG CAPSULE    Take 100 mg by mouth 2 (two) times daily.   INSULIN ASPART (NOVOLOG) 100 UNIT/ML INJECTION    Inject 8 Units into the skin 3 (three) times daily before meals. Give an additional 5  units prior to meals for cbg >200   INSULIN GLARGINE (LANTUS) 100 UNIT/ML INJECTION    Inject 23 Units into the skin at bedtime.    ISOSORBIDE MONONITRATE (IMDUR) 60 MG 24 HR TABLET    Take 60 mg by mouth daily.     LATANOPROST (XALATAN) 0.005 % OPHTHALMIC SOLUTION    Place 1 drop into both eyes at bedtime.     LIDOCAINE (LIDODERM) 5 %    Place 1 patch onto the skin every 12 (twelve) hours. Remove & Discard patch within 12 hours or as directed by MD    LISINOPRIL-HYDROCHLOROTHIAZIDE (PRINZIDE,ZESTORETIC) 20-25 MG PER TABLET    Take 1 tablet by mouth daily.   LORAZEPAM (ATIVAN) 0.5 MG TABLET    Take 0.5 mg by mouth every 8 (eight) hours as needed for anxiety. Take one tablet by mouth once daily   MAGNESIUM HYDROXIDE (MILK OF MAGNESIA) 400 MG/5ML SUSPENSION    Take by mouth daily as needed.     MEMANTINE HCL ER 28 MG CP24    Take 28 mg by mouth daily. dementia   METOPROLOL (LOPRESSOR) 100 MG TABLET    Take 50 mg by mouth 2 (two) times daily.    MULTIPLE VITAMINS-MINERALS (DECUBI-VITE) CAPS  Take 2 capsules by mouth daily.     OMEPRAZOLE (PRILOSEC) 20 MG CAPSULE    Take 20 mg by mouth daily.   ONDANSETRON (ZOFRAN) 4 MG TABLET    Take 4 mg by mouth every 6 (six) hours as needed for nausea or vomiting.   OXYBUTYNIN (DITROPAN-XL) 10 MG 24 HR TABLET    Take 1 tablet (10 mg total) by mouth at bedtime.   OXYCODONE (OXY-IR) 5 MG CAPSULE    Take one tablet by mouth every 8 hours as needed for pain.   OXYCODONE HCL ER (OXYCONTIN) 30 MG T12A    Take one tablet by mouth every 12 hours for pain   POLYETHYLENE GLYCOL POWDER (GLYCOLAX/MIRALAX) POWDER    Take 17 g by mouth 2 (two) times daily.    POTASSIUM CHLORIDE (K-DUR,KLOR-CON) 10 MEQ TABLET    Take 10 mEq by mouth daily.   RANITIDINE (ZANTAC) 150 MG TABLET    Take 150 mg by mouth daily.   SENNA (SENOKOT) 8.6 MG TABLET    Take 2 tablets by mouth 2 (two) times daily.   Modified Medications   No medications on file  Discontinued Medications   No medications  on file    Physical Exam: Filed Vitals:   09/28/13 1516  BP: 140/70  Pulse: 76  Temp: 97.1 F (36.2 C)  Resp: 20  Height: 6\' 2"  (1.88 m)  Weight: 248 lb (112.492 kg)   Physical Exam  Constitutional: He appears well-developed and well-nourished. No distress.  Cardiovascular: Normal rate, regular rhythm and normal heart sounds.   Pulmonary/Chest: Effort normal and breath sounds normal.  Abdominal: Soft. Bowel sounds are normal.  Neurological: He is alert.  Skin: Skin is warm and dry.    Assessment/Plan 1. Type II or unspecified type diabetes mellitus with unspecified complication, uncontrolled -has labs this month--await hba1c -cont novolog 8 units for cbgs over 200, increase lantus from 23 to 28 units and continue to monitor cbgs--if he has lows would try going to 25 units -his lisinopril got stopped due to worsening CKD  Goals of care: here for long term care, DNR  Labs/tests ordered:  Has scheduled labs with hba1c and bmp in jan, April, July, oct

## 2013-11-02 ENCOUNTER — Other Ambulatory Visit: Payer: Self-pay | Admitting: *Deleted

## 2013-11-02 MED ORDER — OXYCODONE HCL ER 30 MG PO T12A: EXTENDED_RELEASE_TABLET | ORAL | Status: DC

## 2013-11-02 NOTE — Telephone Encounter (Signed)
[facility] 

## 2013-11-12 ENCOUNTER — Ambulatory Visit (INDEPENDENT_AMBULATORY_CARE_PROVIDER_SITE_OTHER): Payer: Medicare Other | Admitting: Podiatrist

## 2013-11-12 ENCOUNTER — Encounter: Payer: Self-pay | Admitting: Podiatrist

## 2013-11-12 ENCOUNTER — Non-Acute Institutional Stay (SKILLED_NURSING_FACILITY): Payer: Medicare Other | Admitting: Internal Medicine

## 2013-11-12 VITALS — BP 137/64 | HR 54 | Resp 16 | Ht 75.0 in | Wt 245.0 lb

## 2013-11-12 DIAGNOSIS — L97509 Non-pressure chronic ulcer of other part of unspecified foot with unspecified severity: Secondary | ICD-10-CM

## 2013-11-12 DIAGNOSIS — L97521 Non-pressure chronic ulcer of other part of left foot limited to breakdown of skin: Secondary | ICD-10-CM

## 2013-11-12 DIAGNOSIS — K219 Gastro-esophageal reflux disease without esophagitis: Secondary | ICD-10-CM

## 2013-11-12 DIAGNOSIS — I83009 Varicose veins of unspecified lower extremity with ulcer of unspecified site: Secondary | ICD-10-CM

## 2013-11-12 DIAGNOSIS — F418 Other specified anxiety disorders: Secondary | ICD-10-CM

## 2013-11-12 DIAGNOSIS — L97919 Non-pressure chronic ulcer of unspecified part of right lower leg with unspecified severity: Principal | ICD-10-CM

## 2013-11-12 DIAGNOSIS — I83019 Varicose veins of right lower extremity with ulcer of unspecified site: Secondary | ICD-10-CM

## 2013-11-12 DIAGNOSIS — L97909 Non-pressure chronic ulcer of unspecified part of unspecified lower leg with unspecified severity: Secondary | ICD-10-CM

## 2013-11-12 DIAGNOSIS — I10 Essential (primary) hypertension: Secondary | ICD-10-CM

## 2013-11-12 DIAGNOSIS — F341 Dysthymic disorder: Secondary | ICD-10-CM

## 2013-11-12 DIAGNOSIS — E1149 Type 2 diabetes mellitus with other diabetic neurological complication: Secondary | ICD-10-CM

## 2013-11-12 MED ORDER — SILVER SULFADIAZINE 1 % EX CREA: 1.0000 "application " | TOPICAL_CREAM | Freq: Every day | CUTANEOUS | Status: DC

## 2013-11-12 NOTE — Patient Instructions (Signed)
Dress the toe daily with silvadene cream and a light dressing.  The toe is gray due to silver nitrate that was applied today-- this is normal after this application to reduce the hypertrophic tissue growth

## 2013-11-12 NOTE — Progress Notes (Signed)
   Subjective:    [name], male    DOB: Apr 25, 1929, 80 y.o.   MRN: [id number]  HPI Comments: i have a knot on my 3rd toe on my left foot. i dont know how long ive had it. It doesn't hurt. It will hurt if i bump it. Its remained the same. At the nursing home they dress it everyday and put Vaseline on it.    Foot Pain      Review of Systems  All other systems reviewed and are negative.      Objective:   Physical Exam Vascular exam reveals palpable pedal pulses DP and 2/4 left PT pulse unable to be palpated. Neurological sensation is decreased via Semmes Weinstein monofilament at 2/5 sites left foot. Previous indication of the left fifth digit is noted. Probable amputation of the fifth metatarsal head is noted do to a defect of the plantar skin surface left. Left third toe has a large ulceration at the proximal interphalangeal joint with significant hyper granulation tissue present no exposed bone is present however concern for osteomyelitis is noted due to the hyper granulation tissue present. Contracture of digits 1, 2, 3, 4 also present. Right foot has a dressing on the foot and therefore evaluated at today's visit. He is being seen at the wound care center for the right foot per the patient    Assessment & Plan:  Ulceration left third toe  Plan: Applied silver nitrate to the ulceration itself and then Iodosorb and a dressing was applied. Gave instructions for use of Silvadene cream daily and a dressing. I will see him back in 2 weeks for followup. If any redness, swelling or signs of infection arise he is instructed to call immediately.

## 2013-11-24 ENCOUNTER — Other Ambulatory Visit: Payer: Self-pay | Admitting: *Deleted

## 2013-11-24 MED ORDER — OXYCODONE HCL ER 30 MG PO T12A: EXTENDED_RELEASE_TABLET | ORAL | Status: DC

## 2013-11-24 NOTE — Telephone Encounter (Signed)
[facility] 

## 2013-11-26 ENCOUNTER — Encounter: Payer: Self-pay | Admitting: Podiatrist

## 2013-11-26 ENCOUNTER — Ambulatory Visit (INDEPENDENT_AMBULATORY_CARE_PROVIDER_SITE_OTHER): Payer: Medicare Other | Admitting: Podiatrist

## 2013-11-26 VITALS — BP 177/77 | HR 58 | Resp 16

## 2013-11-26 DIAGNOSIS — L97312 Non-pressure chronic ulcer of right ankle with fat layer exposed: Secondary | ICD-10-CM

## 2013-11-26 DIAGNOSIS — L97521 Non-pressure chronic ulcer of other part of left foot limited to breakdown of skin: Secondary | ICD-10-CM

## 2013-11-26 DIAGNOSIS — L97509 Non-pressure chronic ulcer of other part of unspecified foot with unspecified severity: Secondary | ICD-10-CM

## 2013-11-26 DIAGNOSIS — L97309 Non-pressure chronic ulcer of unspecified ankle with unspecified severity: Secondary | ICD-10-CM

## 2013-11-26 NOTE — Progress Notes (Signed)
Subjective:  Mr. Donaghey presents for follow up of granular ulcer on the left 3rd toe.  He relates the toe has improve since the last visit.  He states he has an ulcer on the right ankle- at the previous visit he had on a large dressing and stated he was being seen by wound care.  From further questioning it appears dressing changes have been occurring at his assisted living facility.  He denies pain.  Denies any signs of infection.    Vascular exam reveals palpable pedal pulses DP and 1/4 bilateral with PT pulse unable to be palpated. Neurological sensation is decreased via Semmes Weinstein monofilament at 2/5 sites left foot. Previous amputation of the left fifth digit is noted. likely amputation of the fifth metatarsal head is noted do to a defect of the plantar skin surface left. Left third toe has a improved  ulceration at the proximal interphalangeal joint with hyper granulation tissue present no exposed bone is present. Contracture of digits 1, 2, 3, 4 also present. Right foot has a large ulceration measuring 1 cm in diameter and 4 mm in depth.  periwound mild swelling is present.   Assessment & Plan:   Ulceration left third toe , ulceration lateral right ankle  Plan: Applied silver nitrate to the ulceration itself and then Iodosorb and a dressing was applied. Gave instructions for continued use of Silvadene cream daily and a dressing. Due to the extent of the ulceration on the right ankle, a referral to the wound center is being set up.  He will continue dressings daily and will call if any concerns arise prior to his wound center appointment.

## 2013-11-27 DIAGNOSIS — F418 Other specified anxiety disorders: Secondary | ICD-10-CM

## 2013-11-27 DIAGNOSIS — I10 Essential (primary) hypertension: Secondary | ICD-10-CM | POA: Insufficient documentation

## 2013-11-27 DIAGNOSIS — I83009 Varicose veins of unspecified lower extremity with ulcer of unspecified site: Secondary | ICD-10-CM | POA: Insufficient documentation

## 2013-11-27 DIAGNOSIS — E1149 Type 2 diabetes mellitus with other diabetic neurological complication: Secondary | ICD-10-CM | POA: Insufficient documentation

## 2013-11-27 DIAGNOSIS — L97909 Non-pressure chronic ulcer of unspecified part of unspecified lower leg with unspecified severity: Secondary | ICD-10-CM

## 2013-11-27 HISTORY — DX: Other specified anxiety disorders: F41.8

## 2013-11-27 NOTE — Progress Notes (Signed)
[name], male   DOB: 08/05/1929, 80 y.o.   MRN: [id number]    Facility: [facility]  Chief Complaint  Patient presents with  . Medical Management of Chronic Issues   No Known Allergies  HPI 80 y/o male patient is seen for routine visit. He has chronic non healing ulcer in right leg, followed by wound care. He has hx of HTN, CHF, PVD, GERD among others. He has been at his baseline, refuses shower. No falls reported. No acute concerns from staff. Patient complaints of shoulder pain. cbg review- 122-300, mostly in 200s bp review 126-160/55-70  Review of Systems  Constitutional: Negative for fever, chills, diaphoresis.  HENT: Negative for congestion Eyes: Negative for blurred vision, double vision and discharge.  Respiratory: Negative for cough, sputum production, shortness of breath and wheezing.   Cardiovascular: Negative for chest pain, palpitations, orthopnea. Has leg swelling.  Gastrointestinal: Negative for heartburn, nausea, vomiting, abdominal pain, diarrhea.  Genitourinary: Negative for dysuria Musculoskeletal: Negative for back pain, falls Skin: Negative for itching and rash.  Neurological: Negative for dizziness, headaches.  Psychiatric/Behavioral: Negative for depression.    Past Medical History  Diagnosis Date  . ANEMIA 01/15/2010  . DIABETIC FOOT ULCER 01/15/2010  . UNSPECIFIED PERIPHERAL VASCULAR DISEASE 01/15/2010  . BACK PAIN, LUMBAR 01/15/2010  . POLYNEUROPATHY 01/15/2010  . HYPONATREMIA 01/15/2010  . DEPRESSION 01/15/2010  . HYPERTENSION 01/15/2010  . 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 DYSLIPIDEMIA 01/15/2010  . HYPOKALEMIA 01/15/2010  . GERD 01/15/2010  . IDDM 01/15/2010   Medication reviewed. See [facility]  Physical exam BP 134/60  Pulse 88  Temp(Src) 98 F (36.7 C)  Resp 16  Ht  (1.88 m)  Wt 246 lb (111.585 kg)  BMI 31.57 kg/m2  Constitutional: obese , in NAD Neck: Neck supple. No JVD present.     Cardiovascular: Normal rate, regular rhythm and intact distal pulses.    Respiratory: Effort normal and breath sounds normal. No respiratory distress. He has no wheezes.   GI: Soft. Bowel sounds are normal. He exhibits no distension. Epigastric tenderness on exam. No rebound tenderness. No guarding or rigidity Musculoskeletal: He exhibits no edema. Is able to move all extremities. Has chronic venous ulcer in right lateral malleolus, dressing in place, no drainage Neurological: He is alert and oriented to person Skin: Skin is warm and dry.   LABS REVIEWED  07-10-12: glucose 167; bun 27; creat 1.92; k+3.6; na++ 139 08-28-12: urine culture: no growth 10-12-12: hgb a1c 6.3 9-14 a1c 7.1 9-14 wbc 8.7, hb 10.6, hct 30.9, plt 161, na 137, k 4.3, glu 283, bun 33, cr 2.8, chol 107, tg 194, ldl 48 08-13-13 pth 42.6, ca 9.5, epo 31.9, vit d 27, wbc 6.5, hb 10.6, hct 30.2, mcv 92, plt 96, na 142, k 3.8, glu 185, bun 46, cr 2.57, lft wnl 09-01-13 na 140, k 3.7, bun 48, cr2.38, a1c 6.5  ASSESSMENT/ PLAN:  Right leg ulcer Has PVD. No signs of infection at present. Will continue wound care and pro pass supplement. Continue decub- vite. Continue santyl  hypertension Well controlled. Continue amlodipine 5 mg daily, imdur 60 mg daily, lopressor 50 mg bid and aspirin  Depression with anxiety Stable, continue celexa, wellbutrin and lorazepam daily with prn ativan  GERD continue omeprazole 20 mg daily  DM type 2 with neurological complications Reviewed a1c, continue lantus 28 u with premeal novolog. Continue tricor. Not on ACEI with impaired kidney function. Continue gabapentin for neuropathic pain. Monitor  cbg. Recheck a1c in 3 months

## 2013-12-01 ENCOUNTER — Telehealth: Payer: Self-pay | Admitting: *Deleted

## 2013-12-01 DIAGNOSIS — L97509 Non-pressure chronic ulcer of other part of unspecified foot with unspecified severity: Secondary | ICD-10-CM

## 2013-12-01 DIAGNOSIS — L97312 Non-pressure chronic ulcer of right ankle with fat layer exposed: Secondary | ICD-10-CM

## 2013-12-01 NOTE — Telephone Encounter (Signed)
Message copied by Enedina FinnerMEADOWS, Chrystel Barefield J on Wed Dec 01, 2013  9:06 AM ------      Message from: Delories HeinzEGERTON, KATHRYN P      Created: Sat Nov 27, 2013 12:00 AM      Regarding: wound center referral       Hey d.            Not sure if Morrie Sheldonshley already did this for me or not but this patient needs a referral to the wound center-- he has an ulcer on the right ankle and on the left 3rd toe as well.            Thanks! ------

## 2013-12-03 ENCOUNTER — Other Ambulatory Visit: Payer: Self-pay | Admitting: Internal Medicine

## 2013-12-03 LAB — BASIC METABOLIC PANEL
BUN: 27 mg/dL — ABNORMAL HIGH (ref 6–23)
CO2: 31 meq/L (ref 19–32)
Calcium: 9.4 mg/dL (ref 8.4–10.5)
Chloride: 101 mEq/L (ref 96–112)
Creat: 1.71 mg/dL — ABNORMAL HIGH (ref 0.50–1.35)
Glucose, Bld: 174 mg/dL — ABNORMAL HIGH (ref 70–99)
POTASSIUM: 3.8 meq/L (ref 3.5–5.3)
SODIUM: 140 meq/L (ref 135–145)

## 2013-12-03 LAB — HEMOGLOBIN A1C
HEMOGLOBIN A1C: 6.9 % — AB (ref <5.7)
Mean Plasma Glucose: 151 mg/dL — ABNORMAL HIGH (ref <117)

## 2013-12-21 ENCOUNTER — Other Ambulatory Visit: Payer: Self-pay | Admitting: *Deleted

## 2013-12-21 MED ORDER — OXYCODONE HCL ER 30 MG PO T12A: EXTENDED_RELEASE_TABLET | ORAL | Status: DC

## 2013-12-21 NOTE — Telephone Encounter (Signed)
[facility] 

## 2013-12-24 LAB — HM DIABETES EYE EXAM

## 2013-12-27 ENCOUNTER — Encounter (HOSPITAL_BASED_OUTPATIENT_CLINIC_OR_DEPARTMENT_OTHER): Payer: Medicare Other | Attending: Plastic Surgery

## 2013-12-27 DIAGNOSIS — E11621 Type 2 diabetes mellitus with foot ulcer: Secondary | ICD-10-CM | POA: Diagnosis not present

## 2013-12-27 DIAGNOSIS — L97311 Non-pressure chronic ulcer of right ankle limited to breakdown of skin: Secondary | ICD-10-CM | POA: Diagnosis not present

## 2013-12-27 DIAGNOSIS — E11622 Type 2 diabetes mellitus with other skin ulcer: Secondary | ICD-10-CM | POA: Insufficient documentation

## 2013-12-27 DIAGNOSIS — L97521 Non-pressure chronic ulcer of other part of left foot limited to breakdown of skin: Secondary | ICD-10-CM | POA: Insufficient documentation

## 2013-12-28 NOTE — Progress Notes (Signed)
[facility]  NAME:  [name] [name]                  ACCOUNT NO.:  [id number]  MEDICAL RECORD NO.:  [id number]      DATE OF BIRTH:  05/19/1929  PHYSICIAN:  Jaylin Roundy [name], DO       VISIT DATE:  12/27/2013                                  OFFICE VISIT   HISTORY OF PRESENT ILLNESS:  The patient is an 80-year-old gentleman who is here on a wheelchair for evaluation of bilateral lower extremity chronic diabetic foot ulcers.  He is in a nursing facility and has been getting some care at [facility].  PAST MEDICAL HISTORY:  He has an extensive past medical history, which includes anemia, diabetes, peripheral vascular disease, back pain, polyneuropathy, hypernatremia, depression, hypertension, hyperlipidemia, alcohol abuse, hypokalemia, gastroesophageal reflux disease.  MEDICATIONS:  Include amlodipine, aspirin, Wellbutrin, Celexa, TriCor, Lasix, Neurontin, NovoLog, Lantus, Imdur, Ativan, Brilinta.  ALLERGIES:  No known drug allergies.  PAST SURGICAL HISTORY:  Include back surgery, neck surgery, and a hernia repair.  REVIEW OF SYSTEMS:  As noted above.  FAMILY HISTORY:  Noncontributory.  PHYSICAL EXAMINATION:  On exam, he is alert and oriented.  He is cooperative.  He is pleasant for the exam.  He seems to be aware of his situation.  His breathing is unlabored.  His heart rate is regular.  He is in the wheelchair, on palpation.  His abdomen is soft and nontender. His pulses are present, but weak in the lower extremities.  He has got very dry, scaly skin on the lower extremity.  His right lateral ankle has an ulcer with the size is noted in the chart.  It is to bone, it undermines, a fair bit debridement was done.  The leg is a little bit red, the ankle is red as well.  This is certainly concerning for osteomyelitis.  The left foot third dorsal middle phalanx skin has a scab on it, but there is no redness or sign of infection.  We will get a x-ray on both  areas, check a prealbumin, hemoglobin A1c, and I did start him on Bactrim.  He does not have any allergies and I would like to see him back in a week.  He is aware if this gets worse, he needs to the emergency room.  We will do triple antibiotic on the left foot and on the right, Santyl.  Elevation, multivitamin, vitamin C, zinc and increase in his protein is strongly recommended as well.     Alizaya Oshea [name], DO     CS/MEDQ  D:  12/27/2013  T:  12/28/2013  Job:  [id number] 

## 2014-01-03 ENCOUNTER — Ambulatory Visit (HOSPITAL_COMMUNITY)
Admission: RE | Admit: 2014-01-03 | Discharge: 2014-01-03 | Disposition: A | Discharge status: Home / Self Care | Payer: Medicare Other | Source: Ambulatory Visit | Attending: Plastic Surgery | Admitting: Plastic Surgery

## 2014-01-03 ENCOUNTER — Other Ambulatory Visit (HOSPITAL_COMMUNITY): Payer: Self-pay | Admitting: Plastic Surgery

## 2014-01-03 ENCOUNTER — Encounter (HOSPITAL_BASED_OUTPATIENT_CLINIC_OR_DEPARTMENT_OTHER): Payer: Medicare Other | Attending: Plastic Surgery

## 2014-01-03 DIAGNOSIS — M869 Osteomyelitis, unspecified: Secondary | ICD-10-CM

## 2014-01-03 DIAGNOSIS — L97522 Non-pressure chronic ulcer of other part of left foot with fat layer exposed: Secondary | ICD-10-CM | POA: Insufficient documentation

## 2014-01-03 DIAGNOSIS — L97313 Non-pressure chronic ulcer of right ankle with necrosis of muscle: Secondary | ICD-10-CM | POA: Insufficient documentation

## 2014-01-03 DIAGNOSIS — E11621 Type 2 diabetes mellitus with foot ulcer: Secondary | ICD-10-CM | POA: Diagnosis not present

## 2014-01-03 DIAGNOSIS — E11622 Type 2 diabetes mellitus with other skin ulcer: Secondary | ICD-10-CM | POA: Diagnosis not present

## 2014-01-06 ENCOUNTER — Encounter (HOSPITAL_BASED_OUTPATIENT_CLINIC_OR_DEPARTMENT_OTHER): Payer: Self-pay | Admitting: *Deleted

## 2014-01-06 ENCOUNTER — Other Ambulatory Visit: Payer: Self-pay | Admitting: Plastic Surgery

## 2014-01-06 DIAGNOSIS — L97312 Non-pressure chronic ulcer of right ankle with fat layer exposed: Secondary | ICD-10-CM

## 2014-01-07 ENCOUNTER — Encounter (HOSPITAL_BASED_OUTPATIENT_CLINIC_OR_DEPARTMENT_OTHER): Payer: Self-pay | Admitting: *Deleted

## 2014-01-07 NOTE — Progress Notes (Addendum)
[facility]  SPOKE W/ PT NURSE, PAM DAVIS LPN, SEE WILL FAX MAR AND LAST WT. Marland Kitchen.  SHE STATES THAT PT MOSTLY USES W/C AND USES WALKER SOME AND STATED PT WAS HIGH RISK FALLS. [facility] IS PROVIDING TRANSPORTATION.   RECEIVED FAXED MAR FROM [facility], UPDATE MED. LIST IN EPIC.  SPOKE WITH PT EVENING NURSE, DJ LASSITER LPN, WITH INSTRUCTIONS NPO AFTER MN WITH CLEAR LIQUIDS UNTIL 0700.  STATED THAT THEIR TRANSPORTATION IS PICKING PT UP AT 0930 AT FACILITY VIA WHEELCHAIR. I WAS TOLD THAT PT CAN STAND AND PIVOT WITH ASSIST TO STRETCHER. SHE ALSO VERBALIZED UNDERSTANDING THAT PT WOULD NEED A NURSE AIDE AT D/C BACK TO FACILITY DUE TO ANESTHESIA. FAXED LIST OF MEDS THAT PT CAN TAKE AM DOS W/ SIPS OF WATER. WILL TAKE NORVASC, PRILOSEC, IMDUR, NAMENDA, GABAPENTIN, LOPRESSOR, OXYCONTIN, CELEXA  AND DO COSOPT EYE DROP.  SPOKE W/ WIFE. SHE WAS UNAWARE OF PROCEDURE BEING SCHEDULED. INFORMED THAT DR [name] SplinterSANGER WAS DEBRIDING RIGHT ANKLE.  WIFE [name] DOS ALL DEPENDS HOW SHE FEELS, SHE'S NOT BEEN WELL.

## 2014-01-10 ENCOUNTER — Encounter (HOSPITAL_BASED_OUTPATIENT_CLINIC_OR_DEPARTMENT_OTHER)
Admission: RE | Disposition: A | Discharge status: Nursing Home (Includes ICF) | Payer: Self-pay | Source: Ambulatory Visit | Attending: Plastic Surgery

## 2014-01-10 ENCOUNTER — Encounter (HOSPITAL_BASED_OUTPATIENT_CLINIC_OR_DEPARTMENT_OTHER): Payer: Self-pay | Admitting: *Deleted

## 2014-01-10 ENCOUNTER — Ambulatory Visit (HOSPITAL_BASED_OUTPATIENT_CLINIC_OR_DEPARTMENT_OTHER)
Admission: RE | Admit: 2014-01-10 | Discharge: 2014-01-10 | Disposition: A | Discharge status: Other Acute Inpatient Hospital | Payer: Medicare Other | Source: Ambulatory Visit | Attending: Plastic Surgery | Admitting: Plastic Surgery

## 2014-01-10 ENCOUNTER — Ambulatory Visit (HOSPITAL_BASED_OUTPATIENT_CLINIC_OR_DEPARTMENT_OTHER): Payer: Medicare Other | Admitting: Anesthesiology

## 2014-01-10 DIAGNOSIS — I110 Hypertensive heart disease with heart failure: Secondary | ICD-10-CM | POA: Insufficient documentation

## 2014-01-10 DIAGNOSIS — F1010 Alcohol abuse, uncomplicated: Secondary | ICD-10-CM | POA: Insufficient documentation

## 2014-01-10 DIAGNOSIS — I5032 Chronic diastolic (congestive) heart failure: Secondary | ICD-10-CM | POA: Insufficient documentation

## 2014-01-10 DIAGNOSIS — K219 Gastro-esophageal reflux disease without esophagitis: Secondary | ICD-10-CM | POA: Diagnosis not present

## 2014-01-10 DIAGNOSIS — E1140 Type 2 diabetes mellitus with diabetic neuropathy, unspecified: Secondary | ICD-10-CM | POA: Diagnosis not present

## 2014-01-10 DIAGNOSIS — E11621 Type 2 diabetes mellitus with foot ulcer: Secondary | ICD-10-CM | POA: Diagnosis not present

## 2014-01-10 DIAGNOSIS — F0150 Vascular dementia without behavioral disturbance: Secondary | ICD-10-CM | POA: Diagnosis not present

## 2014-01-10 DIAGNOSIS — E785 Hyperlipidemia, unspecified: Secondary | ICD-10-CM | POA: Diagnosis not present

## 2014-01-10 DIAGNOSIS — H409 Unspecified glaucoma: Secondary | ICD-10-CM | POA: Insufficient documentation

## 2014-01-10 DIAGNOSIS — F418 Other specified anxiety disorders: Secondary | ICD-10-CM | POA: Diagnosis not present

## 2014-01-10 DIAGNOSIS — I739 Peripheral vascular disease, unspecified: Secondary | ICD-10-CM | POA: Insufficient documentation

## 2014-01-10 DIAGNOSIS — L97312 Non-pressure chronic ulcer of right ankle with fat layer exposed: Secondary | ICD-10-CM

## 2014-01-10 DIAGNOSIS — M6530 Trigger finger, unspecified finger: Secondary | ICD-10-CM | POA: Diagnosis not present

## 2014-01-10 DIAGNOSIS — F329 Major depressive disorder, single episode, unspecified: Secondary | ICD-10-CM | POA: Diagnosis not present

## 2014-01-10 HISTORY — DX: Hyperlipidemia, unspecified: E78.5

## 2014-01-10 HISTORY — DX: Other intervertebral disc degeneration, lumbosacral region: M51.37

## 2014-01-10 HISTORY — DX: Personal history of colonic polyps: Z86.010

## 2014-01-10 HISTORY — DX: History of falling: Z91.81

## 2014-01-10 HISTORY — DX: Non-pressure chronic ulcer of right ankle with unspecified severity: L97.319

## 2014-01-10 HISTORY — DX: Type 2 diabetes mellitus without complications: Z79.4

## 2014-01-10 HISTORY — DX: Essential (primary) hypertension: I10

## 2014-01-10 HISTORY — DX: Disorder of kidney and ureter, unspecified: N28.9

## 2014-01-10 HISTORY — DX: Other intervertebral disc degeneration, lumbosacral region without mention of lumbar back pain or lower extremity pain: M51.379

## 2014-01-10 HISTORY — DX: Personal history of colon polyps, unspecified: Z86.0100

## 2014-01-10 HISTORY — DX: Depression, unspecified: F32.A

## 2014-01-10 HISTORY — DX: Peripheral vascular disease, unspecified: I73.9

## 2014-01-10 HISTORY — DX: Personal history of diabetic foot ulcer: Z86.31

## 2014-01-10 HISTORY — DX: Major depressive disorder, single episode, unspecified: F32.9

## 2014-01-10 HISTORY — DX: Anxiety disorder, unspecified: F41.9

## 2014-01-10 HISTORY — DX: Dependence on wheelchair: Z99.3

## 2014-01-10 HISTORY — DX: Type 2 diabetes mellitus without complications: E11.9

## 2014-01-10 HISTORY — DX: Gastro-esophageal reflux disease without esophagitis: K21.9

## 2014-01-10 HISTORY — DX: Type 2 diabetes mellitus with diabetic polyneuropathy: E11.42

## 2014-01-10 HISTORY — PX: INCISION AND DRAINAGE OF WOUND: SHX1803

## 2014-01-10 HISTORY — DX: Personal history of Methicillin resistant Staphylococcus aureus infection: Z86.14

## 2014-01-10 LAB — POCT I-STAT, CHEM 8
BUN: 30 mg/dL — AB (ref 6–23)
CALCIUM ION: 1.3 mmol/L (ref 1.13–1.30)
CHLORIDE: 99 meq/L (ref 96–112)
Creatinine, Ser: 2.2 mg/dL — ABNORMAL HIGH (ref 0.50–1.35)
Glucose, Bld: 135 mg/dL — ABNORMAL HIGH (ref 70–99)
HCT: 38 % — ABNORMAL LOW (ref 39.0–52.0)
Hemoglobin: 12.9 g/dL — ABNORMAL LOW (ref 13.0–17.0)
Potassium: 4.6 mEq/L (ref 3.7–5.3)
Sodium: 138 mEq/L (ref 137–147)
TCO2: 26 mmol/L (ref 0–100)

## 2014-01-10 LAB — GLUCOSE, CAPILLARY: Glucose-Capillary: 112 mg/dL — ABNORMAL HIGH (ref 70–99)

## 2014-01-10 SURGERY — IRRIGATION AND DEBRIDEMENT WOUND
Anesthesia: General | Site: Ankle | Laterality: Right

## 2014-01-10 MED ORDER — MEPERIDINE HCL 25 MG/ML IJ SOLN
6.2500 mg | INTRAMUSCULAR | Status: DC | PRN
  Filled 2014-01-10: qty 1

## 2014-01-10 MED ORDER — OXYCODONE HCL 5 MG PO TABS
5.0000 mg | ORAL_TABLET | Freq: Once | ORAL | Status: DC | PRN
  Filled 2014-01-10: qty 1

## 2014-01-10 MED ORDER — CEFAZOLIN SODIUM-DEXTROSE 2-3 GM-% IV SOLR
INTRAVENOUS | Status: AC
  Filled 2014-01-10: qty 50

## 2014-01-10 MED ORDER — LACTATED RINGERS IV SOLN
INTRAVENOUS | Status: DC
  Administered 2014-01-10 (×2): via INTRAVENOUS
  Filled 2014-01-10: qty 1000

## 2014-01-10 MED ORDER — ONDANSETRON HCL 4 MG/2ML IJ SOLN
INTRAMUSCULAR | Status: DC | PRN
  Administered 2014-01-10: 4 mg via INTRAVENOUS

## 2014-01-10 MED ORDER — HYDROMORPHONE HCL 1 MG/ML IJ SOLN
0.2500 mg | INTRAMUSCULAR | Status: DC | PRN
  Filled 2014-01-10: qty 1

## 2014-01-10 MED ORDER — FENTANYL CITRATE 0.05 MG/ML IJ SOLN
INTRAMUSCULAR | Status: AC
  Filled 2014-01-10: qty 4

## 2014-01-10 MED ORDER — CEFAZOLIN SODIUM-DEXTROSE 2-3 GM-% IV SOLR
2.0000 g | INTRAVENOUS | Status: AC
  Administered 2014-01-10: 2 g via INTRAVENOUS
  Filled 2014-01-10: qty 50

## 2014-01-10 MED ORDER — PROPOFOL 10 MG/ML IV BOLUS
INTRAVENOUS | Status: DC | PRN
  Administered 2014-01-10: 120 mg via INTRAVENOUS

## 2014-01-10 MED ORDER — EPHEDRINE SULFATE 50 MG/ML IJ SOLN
INTRAMUSCULAR | Status: DC | PRN
  Administered 2014-01-10: 5 mg via INTRAVENOUS
  Administered 2014-01-10: 15 mg via INTRAVENOUS

## 2014-01-10 MED ORDER — POLYMYXIN B SULFATE 500000 UNITS IJ SOLR
INTRAMUSCULAR | Status: DC | PRN
  Administered 2014-01-10: 500 mL

## 2014-01-10 MED ORDER — PROMETHAZINE HCL 25 MG/ML IJ SOLN
6.2500 mg | INTRAMUSCULAR | Status: DC | PRN
  Filled 2014-01-10: qty 1

## 2014-01-10 MED ORDER — FENTANYL CITRATE 0.05 MG/ML IJ SOLN
INTRAMUSCULAR | Status: DC | PRN
  Administered 2014-01-10: 50 ug via INTRAVENOUS

## 2014-01-10 MED ORDER — OXYCODONE HCL 5 MG/5ML PO SOLN
5.0000 mg | Freq: Once | ORAL | Status: DC | PRN
  Filled 2014-01-10: qty 5

## 2014-01-10 MED ORDER — LIDOCAINE HCL (CARDIAC) 20 MG/ML IV SOLN
INTRAVENOUS | Status: DC | PRN
  Administered 2014-01-10: 80 mg via INTRAVENOUS

## 2014-01-10 MED ORDER — DEXAMETHASONE SODIUM PHOSPHATE 4 MG/ML IJ SOLN
INTRAMUSCULAR | Status: DC | PRN
  Administered 2014-01-10: 8 mg via INTRAVENOUS

## 2014-01-10 SURGICAL SUPPLY — 83 items
BAG DECANTER FOR FLEXI CONT (MISCELLANEOUS) IMPLANT
BANDAGE ELASTIC 3 VELCRO ST LF (GAUZE/BANDAGES/DRESSINGS) IMPLANT
BANDAGE ELASTIC 4 VELCRO ST LF (GAUZE/BANDAGES/DRESSINGS) ×3 IMPLANT
BENZOIN TINCTURE PRP APPL 2/3 (GAUZE/BANDAGES/DRESSINGS) IMPLANT
BLADE MINI RND TIP GREEN BEAV (BLADE) IMPLANT
BLADE SURG 10 STRL SS (BLADE) ×3 IMPLANT
BLADE SURG 15 STRL LF DISP TIS (BLADE) ×1 IMPLANT
BLADE SURG 15 STRL SS (BLADE) ×2
BNDG GAUZE ELAST 4 BULKY (GAUZE/BANDAGES/DRESSINGS) ×3 IMPLANT
CANISTER OMNI JUG 16 LITER (MISCELLANEOUS) IMPLANT
CANISTER SUCTION 1200CC (MISCELLANEOUS) ×3 IMPLANT
CANISTER SUCTION 2500CC (MISCELLANEOUS) IMPLANT
CHLORAPREP W/TINT 26ML (MISCELLANEOUS) IMPLANT
CLOSURE WOUND 1/2 X4 (GAUZE/BANDAGES/DRESSINGS)
CONT SPECI 4OZ STER CLIK (MISCELLANEOUS) ×6 IMPLANT
CORDS BIPOLAR (ELECTRODE) IMPLANT
COVER MAYO STAND STRL (DRAPES) IMPLANT
COVER TABLE BACK 60X90 (DRAPES) ×3 IMPLANT
DECANTER SPIKE VIAL GLASS SM (MISCELLANEOUS) IMPLANT
DRAIN PENROSE 18X1/2 LTX STRL (DRAIN) IMPLANT
DRAPE EXTREMITY TIBURON (DRAPES) IMPLANT
DRAPE INCISE IOBAN 66X45 STRL (DRAPES) IMPLANT
DRAPE LG THREE QUARTER DISP (DRAPES) IMPLANT
DRAPE PED LAPAROTOMY (DRAPES) IMPLANT
DRSG ADAPTIC 3X8 NADH LF (GAUZE/BANDAGES/DRESSINGS) IMPLANT
DRSG EMULSION OIL 3X3 NADH (GAUZE/BANDAGES/DRESSINGS) ×3 IMPLANT
ELECT NEEDLE TIP 2.8 STRL (NEEDLE) IMPLANT
ELECT REM PT RETURN 9FT ADLT (ELECTROSURGICAL) ×3
ELECTRODE REM PT RTRN 9FT ADLT (ELECTROSURGICAL) ×1 IMPLANT
GAUZE SPONGE 4X4 12PLY STRL (GAUZE/BANDAGES/DRESSINGS) IMPLANT
GAUZE XEROFORM 1X8 LF (GAUZE/BANDAGES/DRESSINGS) IMPLANT
GAUZE XEROFORM 5X9 LF (GAUZE/BANDAGES/DRESSINGS) IMPLANT
GLOVE BIO SURGEON STRL SZ 6.5 (GLOVE) ×4 IMPLANT
GLOVE BIO SURGEONS STRL SZ 6.5 (GLOVE) ×2
GLOVE BIOGEL M 6.5 STRL (GLOVE) ×3 IMPLANT
GLOVE BIOGEL PI IND STRL 6.5 (GLOVE) ×2 IMPLANT
GLOVE BIOGEL PI INDICATOR 6.5 (GLOVE) ×4
GOWN PREVENTION PLUS LG XLONG (DISPOSABLE) ×3 IMPLANT
GOWN STRL REUS W/TWL LRG LVL3 (GOWN DISPOSABLE) ×3 IMPLANT
GOWN STRL REUS W/TWL XL LVL3 (GOWN DISPOSABLE) ×3 IMPLANT
HANDPIECE INTERPULSE COAX TIP (DISPOSABLE)
IV NS IRRIG 3000ML ARTHROMATIC (IV SOLUTION) IMPLANT
MATRIX SURGICAL PSM 7X10CM (Tissue) ×3 IMPLANT
MICROMATRIX 500MG (Tissue) ×3 IMPLANT
NEEDLE 27GAX1X1/2 (NEEDLE) IMPLANT
NEEDLE HYPO 30GX1 BEV (NEEDLE) IMPLANT
NS IRRIG 1000ML POUR BTL (IV SOLUTION) ×3 IMPLANT
PACK BASIN DAY SURGERY FS (CUSTOM PROCEDURE TRAY) ×3 IMPLANT
PAD ABD 8X10 STRL (GAUZE/BANDAGES/DRESSINGS) IMPLANT
PENCIL BUTTON HOLSTER BLD 10FT (ELECTRODE) ×3 IMPLANT
SET HNDPC FAN SPRY TIP SCT (DISPOSABLE) IMPLANT
SLEEVE SCD COMPRESS KNEE MED (MISCELLANEOUS) IMPLANT
SOLUTION PARTIC MCRMTRX 500MG (Tissue) ×1 IMPLANT
SPONGE GAUZE 4X4 12PLY STER LF (GAUZE/BANDAGES/DRESSINGS) ×3 IMPLANT
SPONGE LAP 18X18 X RAY DECT (DISPOSABLE) ×3 IMPLANT
SPONGE LAP 4X18 X RAY DECT (DISPOSABLE) IMPLANT
STAPLER VISISTAT 35W (STAPLE) IMPLANT
STOCKINETTE IMPERVIOUS LG (DRAPES) IMPLANT
STRIP CLOSURE SKIN 1/2X4 (GAUZE/BANDAGES/DRESSINGS) IMPLANT
SUCTION FRAZIER TIP 10 FR DISP (SUCTIONS) IMPLANT
SURGILUBE 2OZ TUBE FLIPTOP (MISCELLANEOUS) ×3 IMPLANT
SUT ETHILON 3 0 PS 1 (SUTURE) IMPLANT
SUT ETHILON 4 0 P 3 18 (SUTURE) IMPLANT
SUT ETHILON 5 0 PS 2 18 (SUTURE) ×3 IMPLANT
SUT PROLENE 3 0 PS 2 (SUTURE) IMPLANT
SUT SILK 3 0 PS 1 (SUTURE) IMPLANT
SUT VIC AB 3-0 FS2 27 (SUTURE) IMPLANT
SUT VIC AB 5-0 P-3 18X BRD (SUTURE) ×2 IMPLANT
SUT VIC AB 5-0 P3 18 (SUTURE) ×4
SUT VIC AB 5-0 PS2 18 (SUTURE) IMPLANT
SYR BULB IRRIGATION 50ML (SYRINGE) ×3 IMPLANT
SYR CONTROL 10ML LL (SYRINGE) IMPLANT
TAPE HYPAFIX 6 X30' (GAUZE/BANDAGES/DRESSINGS)
TAPE HYPAFIX 6X30 (GAUZE/BANDAGES/DRESSINGS) IMPLANT
TIP RIGID 35CM EVICEL (HEMOSTASIS) IMPLANT
TOWEL OR 17X24 6PK STRL BLUE (TOWEL DISPOSABLE) ×3 IMPLANT
TRAY DSU PREP LF (CUSTOM PROCEDURE TRAY) IMPLANT
TUBE CONNECTING 12'X1/4 (SUCTIONS) ×1
TUBE CONNECTING 12X1/4 (SUCTIONS) ×2 IMPLANT
UNDERPAD 30X30 INCONTINENT (UNDERPADS AND DIAPERS) ×3 IMPLANT
WATER STERILE IRR 1000ML POUR (IV SOLUTION) IMPLANT
WATER STERILE IRR 500ML POUR (IV SOLUTION) ×6 IMPLANT
YANKAUER SUCT BULB TIP NO VENT (SUCTIONS) IMPLANT

## 2014-01-10 NOTE — Interval H&P Note (Signed)
History and Physical Interval Note:  01/10/2014 2:21 PM  [name]  has presented today for surgery, with the diagnosis of RIGHT ANKLE ULCER WOUND  The various methods of treatment have been discussed with the patient and family. After consideration of risks, benefits and other options for treatment, the patient has consented to  Procedure(s): IRRIGATION AND DEBRIDEMENT RIGHT ANKLE WOUND WITH PLACEMENT OF ACELL (Right) as a surgical intervention .  The patient's history has been reviewed, patient examined, no change in status, stable for surgery.  I have reviewed the patient's chart and labs.  Questions were answered to the patient's satisfaction.     [name],[name]

## 2014-01-10 NOTE — Anesthesia Preprocedure Evaluation (Addendum)
Anesthesia Evaluation  Patient identified by MRN, date of birth, ID band Patient awake and Patient confused    Reviewed: H&P   Airway Mallampati: II  TM Distance: >3 FB Neck ROM: Limited    Dental  (+) Edentulous Upper, Edentulous Lower   Pulmonary former smoker,    Pulmonary exam normal       Cardiovascular Exercise Tolerance: Poor hypertension, Pt. on medications and Pt. on home beta blockers + Peripheral Vascular Disease and +CHF Rhythm:Regular Rate:Bradycardia  10-Jan-2014 Sinus bradycardia Left ventricular hypertrophy ST elevation, consider early repolarization, pericarditis, or injury  Transthoracic echocardiography.2-11 - Left ventricle: The cavity size was normal. Wall thickness was  increased in a pattern of moderate to severe LVH. The estimated  ejection fraction was 60%. Regional wall motion abnormalities  cannot be excluded. Doppler parameters are consistent with  abnormal left ventricular relaxation (grade 1 diastolic  dysfunction). - Aortic valve: Sclerosis without stenosis. - Mitral valve: Calcified annulus. Trivial regurgitation. . .   Neuro/Psych Anxiety Depression    GI/Hepatic GERD-  Medicated and Controlled,  Endo/Other  diabetes, Type 2, Oral Hypoglycemic Agents  Renal/GU Renal Insufficiency and CRFRenal disease     Musculoskeletal  (+) Arthritis -, Osteoarthritis,    Abdominal   Peds  Hematology  (+) anemia ,   Anesthesia Other Findings   Reproductive/Obstetrics                        Anesthesia Physical Anesthesia Plan  ASA: III  Anesthesia Plan: General   Post-op Pain Management:    Induction: Intravenous  Airway Management Planned: LMA  Additional Equipment:   Intra-op Plan:   Post-operative Plan: Extubation in OR  Informed Consent:   Dental advisory given  Plan Discussed with: CRNA and Anesthesiologist  Anesthesia Plan  Comments:         Anesthesia Quick Evaluation

## 2014-01-10 NOTE — H&P (View-Only) (Signed)
[facility]  NAME:  [name], Carmon                  ACCOUNT NO.:  [id number]  MEDICAL RECORD NO.:  [id number]      DATE OF BIRTH:  February 21, 1930  PHYSICIAN:  Wayland Denislaire [name], DO       VISIT DATE:  12/27/2013                                  OFFICE VISIT   HISTORY OF PRESENT ILLNESS:  The patient is an 80 year old gentleman who is here on a wheelchair for evaluation of bilateral lower extremity chronic diabetic foot ulcers.  He is in a nursing facility and has been getting some care at [facility].  PAST MEDICAL HISTORY:  He has an extensive past medical history, which includes anemia, diabetes, peripheral vascular disease, back pain, polyneuropathy, hypernatremia, depression, hypertension, hyperlipidemia, alcohol abuse, hypokalemia, gastroesophageal reflux disease.  MEDICATIONS:  Include amlodipine, aspirin, Wellbutrin, Celexa, TriCor, Lasix, Neurontin, NovoLog, Lantus, Imdur, Ativan, Brilinta.  ALLERGIES:  No known drug allergies.  PAST SURGICAL HISTORY:  Include back surgery, neck surgery, and a hernia repair.  REVIEW OF SYSTEMS:  As noted above.  FAMILY HISTORY:  Noncontributory.  PHYSICAL EXAMINATION:  On exam, he is alert and oriented.  He is cooperative.  He is pleasant for the exam.  He seems to be aware of his situation.  His breathing is unlabored.  His heart rate is regular.  He is in the wheelchair, on palpation.  His abdomen is soft and nontender. His pulses are present, but weak in the lower extremities.  He has got very dry, scaly skin on the lower extremity.  His right lateral ankle has an ulcer with the size is noted in the chart.  It is to bone, it undermines, a fair bit debridement was done.  The leg is a little bit red, the ankle is red as well.  This is certainly concerning for osteomyelitis.  The left foot third dorsal middle phalanx skin has a scab on it, but there is no redness or sign of infection.  We will get a x-ray on both  areas, check a prealbumin, hemoglobin A1c, and I did start him on Bactrim.  He does not have any allergies and I would like to see him back in a week.  He is aware if this gets worse, he needs to the emergency room.  We will do triple antibiotic on the left foot and on the right, Santyl.  Elevation, multivitamin, vitamin C, zinc and increase in his protein is strongly recommended as well.     Wayland Denislaire [name], DO     CS/MEDQ  D:  12/27/2013  T:  12/28/2013  Job:  [id number]

## 2014-01-10 NOTE — Anesthesia Procedure Notes (Signed)
Procedure Name: LMA Insertion Date/Time: 01/10/2014 1:20 PM Performed by: Tyrone NineSAUVE, Kenijah Benningfield F Pre-anesthesia Checklist: Patient identified, Timeout performed, Emergency Drugs available, Suction available and Patient being monitored Patient Re-evaluated:Patient Re-evaluated prior to inductionOxygen Delivery Method: Circle system utilized Preoxygenation: Pre-oxygenation with 100% oxygen Intubation Type: IV induction Ventilation: Mask ventilation with difficulty LMA: LMA inserted LMA Size: 5.0 Number of attempts: 1 Placement Confirmation: positive ETCO2 and breath sounds checked- equal and bilateral Tube secured with: Tape

## 2014-01-10 NOTE — Anesthesia Postprocedure Evaluation (Signed)
  Anesthesia Post-op Note  Patient: [name]  Procedure(s) Performed: Procedure(s): IRRIGATION AND DEBRIDEMENT RIGHT ANKLE WOUND WITH PLACEMENT OF ACELL (Right)  Patient Location: PACU  Anesthesia Type:General  Level of Consciousness: awake  Airway and Oxygen Therapy: Patient Spontanous Breathing and Patient connected to nasal cannula oxygen  Post-op Pain: mild  Post-op Assessment: Post-op Vital signs reviewed, Patient's Cardiovascular Status Stable, Respiratory Function Stable and Patent Airway  Post-op Vital Signs: Reviewed and stable  Last Vitals:  Filed Vitals:   01/10/14 1430  BP: 139/71  Pulse: 62  Temp:   Resp: 19    Complications: No apparent anesthesia complications

## 2014-01-10 NOTE — Discharge Instructions (Signed)
Place KY gel on the area of the right lateral malleolus every other day starting on Wednesday. Post Anesthesia Home Care Instructions  Activity: Get plenty of rest for the remainder of the day. A responsible adult should stay with you for 24 hours following the procedure.  For the next 24 hours, DO NOT: -Drive a car -Advertising copywriterperate machinery -Drink alcoholic beverages -Take any medication unless instructed by your physician -Make any legal decisions or sign important papers.  Meals: Start with liquid foods such as gelatin or soup. Progress to regular foods as tolerated. Avoid greasy, spicy, heavy foods. If nausea and/or vomiting occur, drink only clear liquids until the nausea and/or vomiting subsides. Call your physician if vomiting continues.  Special Instructions/Symptoms: Your throat Herrle feel dry or sore from the anesthesia or the breathing tube placed in your throat during surgery. If this causes discomfort, gargle with warm salt water. The discomfort should disappear within 24 hours. Call your surgeon if you experience:   1.  Fever over 101.0. 2.  Inability to urinate. 3.  Nausea and/or vomiting. 4.  Extreme swelling or bruising at the surgical site. 5.  Continued bleeding from the incision. 6.  Increased pain, redness or drainage from the incision. 7.  Problems related to your pain medication. 8. Any change in color, movement and/or sensation 9. Any problems and/or concerns

## 2014-01-10 NOTE — Brief Op Note (Signed)
01/10/2014  2:08 PM  PATIENT:  [name]  80 y.o. male  PRE-OPERATIVE DIAGNOSIS:  RIGHT ANKLE ULCER WOUND  POST-OPERATIVE DIAGNOSIS:  RIGHT ANKLE ULCER WOUND  PROCEDURE:  Procedure(s): IRRIGATION AND DEBRIDEMENT RIGHT ANKLE WOUND WITH PLACEMENT OF ACELL (Right)  SURGEON:  Surgeon(s) and Role:    * Claire [name], DO - Primary  PHYSICIAN ASSISTANT: Shawn Rayburn, PA  ASSISTANTS: none   ANESTHESIA:   general  EBL:  Total I/O In: 700 [I.V.:700] Out: -   BLOOD ADMINISTERED:none  DRAINS: none   LOCAL MEDICATIONS USED:  NONE  SPECIMEN:  Source of Specimen:  right lateral malleolus bone  DISPOSITION OF SPECIMEN:  micro  COUNTS:  YES  TOURNIQUET:  * No tourniquets in log *  DICTATION: .Dragon Dictation  PLAN OF CARE: Discharge to home after PACU  PATIENT DISPOSITION:  PACU - hemodynamically stable.   Delay start of Pharmacological VTE agent (>24hrs) due to surgical blood loss or risk of bleeding: no

## 2014-01-10 NOTE — Op Note (Signed)
Operative Note   DATE OF OPERATION: 01/10/2014  [facility]  SURGICAL DIVISION: Plastic Surgery  PREOPERATIVE DIAGNOSES:  Right lateral malleolus ulcer 2 x 2 x 2cm  POSTOPERATIVE DIAGNOSES:  same  PROCEDURE:  Preparation of right lateral malleolus for placement of Acell (powder 500 mg and sheet 7 x 10 cm) after debridement of skin, soft tissue, tendon and bone. 2 x 2 x 2 cm  SURGEON: Tribune CompanyClaire [name], DO  ASSISTANT: Shawn Rayburn, PA  ANESTHESIA:  General.   COMPLICATIONS: None.   INDICATIONS FOR PROCEDURE:  The patient, [name] is a 80 y.o. male born on 1929/04/27, is here for treatment of a right malleolus ulcer to bone. MRN: [id number]  CONSENT:  Informed consent was obtained directly from the patient. Risks, benefits and alternatives were fully discussed. Specific risks including but not limited to bleeding, infection, hematoma, seroma, scarring, pain, infection, contracture, asymmetry, wound healing problems, and need for further surgery were all discussed. The patient did have an ample opportunity to have questions answered to satisfaction.   DESCRIPTION OF PROCEDURE:  The patient was taken to the operating room. SCDs were placed and IV antibiotics were given. The patient's operative site was prepped and draped in a sterile fashion. A time out was performed and all information was confirmed to be correct.  General anesthesia was administered.  The area was debrided with a #10 blade, #15 blade and a curette. The wound went to bone.  The ronguer was used to debride the bone and send for culture, gram stain and sensitivity.  The area was irrigated with antibiotic solution.  Hemostasis was achieved with electrocautery.  The Acell powder was placed followed by the sheet.  The sheet was secured to the area with 5-0 Monocryl.  Ky gel was applied with adaptic, gauze, kerlex and an ace wrap. The patient tolerated the procedure well.  There were no  complications. The patient was allowed to wake from anesthesia, extubated and taken to the recovery room in satisfactory condition.

## 2014-01-10 NOTE — Transfer of Care (Signed)
Immediate Anesthesia Transfer of Care Note  Patient: [name]  Procedure(s) Performed: Procedure(s): IRRIGATION AND DEBRIDEMENT RIGHT ANKLE WOUND WITH PLACEMENT OF ACELL (Right)  Patient Location: PACU  Anesthesia Type:General  Level of Consciousness: sedated and patient cooperative  Airway & Oxygen Therapy: Patient Spontanous Breathing and Patient connected to nasal cannula oxygen  Post-op Assessment: Report given to PACU RN and Post -op Vital signs reviewed and stable  Post vital signs: Reviewed and stable  Complications: No apparent anesthesia complications

## 2014-01-11 ENCOUNTER — Encounter (HOSPITAL_BASED_OUTPATIENT_CLINIC_OR_DEPARTMENT_OTHER): Payer: Self-pay | Admitting: Plastic Surgery

## 2014-01-14 LAB — TISSUE CULTURE
Culture: NO GROWTH
GRAM STAIN: NONE SEEN

## 2014-01-15 LAB — ANAEROBIC CULTURE: GRAM STAIN: NONE SEEN

## 2014-01-17 DIAGNOSIS — E11622 Type 2 diabetes mellitus with other skin ulcer: Secondary | ICD-10-CM | POA: Diagnosis not present

## 2014-01-18 NOTE — Progress Notes (Signed)
[facility]  NAME:  [name], [name]                  ACCOUNT NO.:  [id number]  MEDICAL RECORD NO.:  [id number]      DATE OF BIRTH:  1929/12/05  PHYSICIAN:  Wayland Denislaire [name], DO       VISIT DATE:  01/17/2014                                  OFFICE VISIT   The patient is an 80 year old gentleman who is here for followup on his bilateral lower extremity ulcers.  He has 1 on his right ankle laterally and 1 on his third toe on the left foot.  He underwent debridement with ACell placement in the OR.  There is no change in his medications or social history.  He is doing very well.  His pain is well controlled. The Adaptic is still in place, so there is no sign of infection.  He is alert, oriented, cooperative, not in any distress.  He is very pleasant.  His pulses are weak but present.  No redness.  No increase in swelling.  Recommendation is to continue with the KY jelly daily.  Increase his protein and follow up in 1 week.     Wayland Denislaire [name], DO     CS/MEDQ  D:  01/17/2014  T:  01/18/2014  Job:  [id number]

## 2014-01-19 ENCOUNTER — Other Ambulatory Visit: Payer: Self-pay | Admitting: *Deleted

## 2014-01-19 MED ORDER — OXYCODONE HCL ER 30 MG PO T12A: EXTENDED_RELEASE_TABLET | ORAL | Status: DC

## 2014-01-19 NOTE — Telephone Encounter (Signed)
[facility] 

## 2014-01-24 DIAGNOSIS — E11622 Type 2 diabetes mellitus with other skin ulcer: Secondary | ICD-10-CM | POA: Diagnosis not present

## 2014-01-24 DIAGNOSIS — L97313 Non-pressure chronic ulcer of right ankle with necrosis of muscle: Secondary | ICD-10-CM | POA: Diagnosis not present

## 2014-01-24 DIAGNOSIS — L97522 Non-pressure chronic ulcer of other part of left foot with fat layer exposed: Secondary | ICD-10-CM | POA: Diagnosis not present

## 2014-01-24 DIAGNOSIS — E11621 Type 2 diabetes mellitus with foot ulcer: Secondary | ICD-10-CM | POA: Diagnosis not present

## 2014-01-25 NOTE — Progress Notes (Signed)
[facility]  NAME:  [name] PontMAY, Cashmere                  ACCOUNT NO.:  [id number]  MEDICAL RECORD NO.:  [id number]      DATE OF BIRTH:  1929/04/30  PHYSICIAN:  Wayland Denislaire [name], DO       VISIT DATE:  01/24/2014                                  OFFICE VISIT   The patient is an 80 year old male who is here for a followup on his right foot diabetic ulcer.  His cultures come back negative from the bone.  The area has some fibrous tissue, some ACell is still incorporating, so we will do Adaptic hydrogel wrap and I do want to see him back in a week.  He acknowledges understanding and agreeing with this plan.     Wayland Denislaire [name], DO     CS/MEDQ  D:  01/24/2014  T:  01/24/2014  Job:  [id number]

## 2014-02-02 ENCOUNTER — Non-Acute Institutional Stay (SKILLED_NURSING_FACILITY): Payer: Medicare Other | Admitting: Adult Health

## 2014-02-02 DIAGNOSIS — T50905A Adverse effect of unspecified drugs, medicaments and biological substances, initial encounter: Secondary | ICD-10-CM

## 2014-02-02 DIAGNOSIS — I83012 Varicose veins of right lower extremity with ulcer of calf: Secondary | ICD-10-CM

## 2014-02-02 DIAGNOSIS — E1159 Type 2 diabetes mellitus with other circulatory complications: Secondary | ICD-10-CM

## 2014-02-02 DIAGNOSIS — K219 Gastro-esophageal reflux disease without esophagitis: Secondary | ICD-10-CM

## 2014-02-02 DIAGNOSIS — I5032 Chronic diastolic (congestive) heart failure: Secondary | ICD-10-CM

## 2014-02-02 DIAGNOSIS — F0150 Vascular dementia without behavioral disturbance: Secondary | ICD-10-CM

## 2014-02-02 DIAGNOSIS — K5903 Drug induced constipation: Secondary | ICD-10-CM

## 2014-02-02 DIAGNOSIS — E1151 Type 2 diabetes mellitus with diabetic peripheral angiopathy without gangrene: Secondary | ICD-10-CM

## 2014-02-02 DIAGNOSIS — I83019 Varicose veins of right lower extremity with ulcer of unspecified site: Secondary | ICD-10-CM

## 2014-02-02 DIAGNOSIS — I83014 Varicose veins of right lower extremity with ulcer of heel and midfoot: Secondary | ICD-10-CM

## 2014-02-02 DIAGNOSIS — G8929 Other chronic pain: Secondary | ICD-10-CM

## 2014-02-02 DIAGNOSIS — I119 Hypertensive heart disease without heart failure: Secondary | ICD-10-CM

## 2014-02-02 DIAGNOSIS — I83011 Varicose veins of right lower extremity with ulcer of thigh: Secondary | ICD-10-CM

## 2014-02-02 DIAGNOSIS — E785 Hyperlipidemia, unspecified: Secondary | ICD-10-CM

## 2014-02-02 DIAGNOSIS — I83013 Varicose veins of right lower extremity with ulcer of ankle: Secondary | ICD-10-CM

## 2014-02-02 DIAGNOSIS — L97919 Non-pressure chronic ulcer of unspecified part of right lower leg with unspecified severity: Secondary | ICD-10-CM

## 2014-02-02 DIAGNOSIS — K5909 Other constipation: Secondary | ICD-10-CM

## 2014-02-02 DIAGNOSIS — F418 Other specified anxiety disorders: Secondary | ICD-10-CM

## 2014-02-02 DIAGNOSIS — I83018 Varicose veins of right lower extremity with ulcer other part of lower leg: Secondary | ICD-10-CM

## 2014-02-02 DIAGNOSIS — I83015 Varicose veins of right lower extremity with ulcer other part of foot: Secondary | ICD-10-CM

## 2014-02-06 ENCOUNTER — Encounter: Payer: Self-pay | Admitting: Adult Health

## 2014-02-06 DIAGNOSIS — E785 Hyperlipidemia, unspecified: Secondary | ICD-10-CM | POA: Insufficient documentation

## 2014-02-06 NOTE — Progress Notes (Signed)
Patient ID: [name] BurrowMelvin T [name], male   DOB: 1929/07/21, 80 y.o.   MRN: [id number]  [name]     No Known Allergies     Chief Complaint  Patient presents with  . Medical Management of Chronic Issues    HPI:  He is a long term resident of this facility being seen for the management of his chronic illnesses. He is without significant change in his overall status. He is not voicing any complaints of pain states that he feels ok. There are no nursing concerns being voiced.    Past Medical History  Diagnosis Date  . 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 Hypertension   . Peripheral vascular disease   . Depression   . Anxiety   . GERD (gastroesophageal reflux disease)   . Renal insufficiency   . Dyslipidemia   . DDD (degenerative disc disease), lumbosacral   . History of colon polyps   . History of MRSA infection     Hx chronic diabetic ulcer's secondary MRSA  . Ulcer of right ankle   . Insulin dependent type 2 diabetes mellitus   . History of diabetic ulcer of foot   . Diabetic peripheral neuropathy   . Wheelchair dependent   . At high risk for falls     Past Surgical History  Procedure Laterality Date  . Laparoscopic cholecystectomy    . Inguinal hernia repair Left 11-30-2001  . Colonoscopy with esophagogastroduodenoscopy (egd)  09-21-2002  . Transthoracic echocardiogram  12-20-2009    severe LVH/  ef 60%/  grade I diastolic dysfunction/  AV sclerosis without stenosis/  mild LAE and RAE/    . Toe amputation    . Incision and drainage of wound Right 01/10/2014    Procedure: IRRIGATION AND DEBRIDEMENT RIGHT ANKLE WOUND WITH PLACEMENT OF ACELL;  Surgeon: Wayland Denislaire [name], DO;  Location: [facility];  Service: Plastics;  Laterality: Right;    VITAL SIGNS BP 148/70 mmHg  Pulse 66  Ht 6\' 2"  (1.88 m)  Wt 246 lb (111.585 kg)  BMI 31.57 kg/m2   Outpatient Encounter Prescriptions as of 02/02/2014  Medication Sig  . amLODipine (NORVASC) 5 MG tablet  Take 5 mg by mouth every morning.   Marland Kitchen. aspirin 81 MG tablet Take 81 mg by mouth every morning.   Marland Kitchen. buPROPion (WELLBUTRIN) 75 MG tablet Take 75 mg by mouth 2 (two) times daily.   . citalopram (CELEXA) 20 MG tablet Take 20 mg by mouth every morning.   . dorzolamide-timolol (COSOPT) 22.3-6.8 MG/ML ophthalmic solution Place 1 drop into both eyes 2 (two) times daily.  . fenofibrate (TRICOR) 145 MG tablet Take 145 mg by mouth every evening.   . furosemide (LASIX) 40 MG tablet Take 40 mg by mouth 2 (two) times daily.   Marland Kitchen. gabapentin (NEURONTIN) 100 MG capsule Take 100 mg by mouth 2 (two) times daily.  . insulin aspart (NOVOLOG) 100 UNIT/ML injection Inject 8 Units into the skin 3 (three) times daily before meals. Give an additional 2 units prior to meals for cbg >200  . insulin glargine (LANTUS) 100 UNIT/ML injection Inject 28 Units into the skin at bedtime.   . isosorbide mononitrate (IMDUR) 60 MG 24 hr tablet Take 60 mg by mouth every morning.   . latanoprost (XALATAN) 0.005 % ophthalmic solution Place 1 drop into both eyes at bedtime.    . lidocaine (LIDODERM) 5 % Place 1 patch onto the skin every 12 (twelve) hours. Remove & Discard patch within  12 hours to right neck  . LORazepam (ATIVAN) 0.5 MG tablet Take 0.5 mg by mouth every 6 (six) hours as needed for anxiety. Take 0.25mg   tablet by mouth once daily  . magnesium hydroxide (MILK OF MAGNESIA) 400 MG/5ML suspension Take by mouth daily as needed.    . Memantine HCl ER (NAMENDA XR) 14 MG CP24 Take 1 tablet by mouth every morning.  . metoprolol (LOPRESSOR) 50 MG tablet Take 50 mg by mouth 2 (two) times daily.  . Multiple Vitamins-Minerals (DECUBI-VITE) CAPS Take 1 capsule by mouth 2 (two) times daily.   Marland Kitchen. omeprazole (PRILOSEC) 20 MG capsule Take 20 mg by mouth every morning.   . ondansetron (ZOFRAN) 4 MG tablet Take 4 mg by mouth every 6 (six) hours as needed for nausea or vomiting.  Marland Kitchen. oxycodone (OXY-IR) 5 MG capsule Take one tablet by mouth every 8  hours as needed for pain.  . OxyCODONE HCl ER (OXYCONTIN) 30 MG T12A Take one tablet by mouth every 12 hours for pain  . polyethylene glycol powder (GLYCOLAX/MIRALAX) powder Take 17 g by mouth 2 (two) times daily.   . potassium chloride (K-DUR,KLOR-CON) 10 MEQ tablet Take 10 mEq by mouth every morning.   . senna (SENOKOT) 8.6 MG tablet Take 2 tablets by mouth 2 (two) times daily.   . [DISCONTINUED] menthol-cetylpyridinium (CEPACOL) 3 MG lozenge Take 1 lozenge by mouth every 6 (six) hours as needed for sore throat.  . [DISCONTINUED] sulfamethoxazole-trimethoprim (BACTRIM DS) 800-160 MG per tablet Take 1 tablet by mouth 2 (two) times daily.     SIGNIFICANT DIAGNOSTIC EXAMS  LABS REVIEWED:   08-03-13: wbc 6. 5; hgb 10.6; hct 30.2; mcv 92.6; plt 96; glucose 185; bun 46 creat 2.57; k+ 3.8; na++142; liver normal albumin 3.9 pth 42.6; ca++ 9.5; EPO 31.9; vit d 27 09-01-13: glucose 224; bun 48; creat 2.38; k+ 3.7; na++140; hgb a1c 6.5  11-15-13: urine micro-albumin 17.73 12-03-13: glucose 174; bun 27; creat 1.71; k+3.8; na++140 hgb a1c 6.9     Review of Systems  Constitutional: Negative for malaise/fatigue.  Respiratory: Negative for cough and shortness of breath.   Cardiovascular: Negative for chest pain, palpitations and leg swelling.  Gastrointestinal: Negative for heartburn, abdominal pain and constipation.  Musculoskeletal: Negative for joint pain.  Skin:       Chronic ulcer right foot   Psychiatric/Behavioral: Negative for depression. The patient is not nervous/anxious.      Physical Exam  Constitutional: No distress.  Overweight   Neck: Neck supple. No JVD present.  Cardiovascular: Normal rate and regular rhythm.   Pedal pulses very faint   Respiratory: Effort normal and breath sounds normal. No respiratory distress. He has no wheezes.  GI: Soft. Bowel sounds are normal. He exhibits no distension. There is no tenderness.  Musculoskeletal: He exhibits no edema.  Is able to move all  extremities   Neurological: He is alert.  Skin: Skin is warm and dry. He is not diaphoretic.  Right outer ankle ulcer with yellow slough present; no signs of infection present        ASSESSMENT/ PLAN:  1. Hypertension: stable will continue norvasc 5 mg daily lopressor 50 mg twice daily and will monitor   2. Chronic diastolic heart failure: is stable will continue lasix 40 mg twice daily with k+ 10 meq daily; imdur 60 mg daily and will monitor  3. Gerd: will continue prilosec 20 mg daily  4.  Constipation: will continue miralax twice daily; senna 2 tabs twice  daily   5. Diabetes: will continue lantus 28 units daily and novolog 8 units with meals for cbg >200  6. Depression with anxiety: is stable will continue celexa 20 mg daily; wellbutrin 75 mg twice daily and ativan 0.25 mg daily and 0.5 mg twice daily as needed   7. Vascular dementia: is without change; will continue namenda xr 14 mg daily and will monitor  8. Dyslipidemia: will continue tricor 145 mg daily   9. Chronic pain: his pain is presently being managed will continue oxycontin 30 mg twice daily; neurontin 100 mg twice daily; lidoderm patch to his right neck daily; and oxycodone 5 mg every 8 hours as needed   10. Glaucoma; will continue latanoprost to both eyes nightly and cosopt to both eyes twice daily   11. Stasis leg ulcer: goes to wound clinic to right outer ankle ulcer; will continue to monitor his status.      Synthia Innocent NP [facility]  Contact [phone] Monday through Friday 8am- 5pm  After hours call [phone]

## 2014-02-07 ENCOUNTER — Encounter (HOSPITAL_BASED_OUTPATIENT_CLINIC_OR_DEPARTMENT_OTHER): Payer: Medicare Other | Attending: Plastic Surgery

## 2014-02-07 DIAGNOSIS — L97519 Non-pressure chronic ulcer of other part of right foot with unspecified severity: Secondary | ICD-10-CM | POA: Insufficient documentation

## 2014-02-07 DIAGNOSIS — E11621 Type 2 diabetes mellitus with foot ulcer: Secondary | ICD-10-CM | POA: Diagnosis not present

## 2014-02-07 NOTE — Progress Notes (Signed)
[facility]  NAME:  Eulah PontMAY, [place]achi                  ACCOUNT NO.:  [id number]  MEDICAL RECORD NO.:  [id number]      DATE OF BIRTH:  [birth year]  PHYSICIAN:  Wayland Denislaire [name], DO       VISIT DATE:  02/07/2014                                  OFFICE VISIT   The patient is an 80 year old male who is here for followup on his left lower extremity, ankle.  He underwent debridement with ACell placement, and he is in a facility.  There is no change in his medications or social history.  On exam, he is alert, oriented, cooperative, not in any distress.  He is pleasant.  Pupils equal.  Breathing is unlabored.  Heart rate is regular.  He understands the severity of the condition.  The area is not red and does not appear to be infected.  The ACell appears to be in place, so we will continue with the K-Y Jelly and follow up in several weeks.  He is to continue with multivitamin, vitamin C, zinc, and protein.     Wayland Denislaire [name], DO     CS/MEDQ  D:  02/07/2014  T:  02/07/2014  Job:  [id number]

## 2014-02-15 ENCOUNTER — Non-Acute Institutional Stay (SKILLED_NURSING_FACILITY): Payer: Medicare Other | Admitting: Adult Health

## 2014-02-15 ENCOUNTER — Other Ambulatory Visit: Payer: Self-pay | Admitting: *Deleted

## 2014-02-15 DIAGNOSIS — L03115 Cellulitis of right lower limb: Secondary | ICD-10-CM

## 2014-02-15 MED ORDER — OXYCODONE HCL 5 MG PO CAPS: ORAL_CAPSULE | ORAL | Status: DC

## 2014-02-15 NOTE — Telephone Encounter (Signed)
[facility] 

## 2014-02-24 ENCOUNTER — Other Ambulatory Visit: Payer: Self-pay | Admitting: Internal Medicine

## 2014-02-24 ENCOUNTER — Non-Acute Institutional Stay (SKILLED_NURSING_FACILITY): Payer: Medicare Other | Admitting: Adult Health

## 2014-02-24 DIAGNOSIS — M25571 Pain in right ankle and joints of right foot: Secondary | ICD-10-CM

## 2014-02-24 DIAGNOSIS — M869 Osteomyelitis, unspecified: Secondary | ICD-10-CM

## 2014-02-24 DIAGNOSIS — L03115 Cellulitis of right lower limb: Secondary | ICD-10-CM

## 2014-02-24 DIAGNOSIS — R600 Localized edema: Secondary | ICD-10-CM

## 2014-02-24 DIAGNOSIS — S82891A Other fracture of right lower leg, initial encounter for closed fracture: Secondary | ICD-10-CM

## 2014-02-24 DIAGNOSIS — L02419 Cutaneous abscess of limb, unspecified: Secondary | ICD-10-CM

## 2014-02-24 DIAGNOSIS — L97319 Non-pressure chronic ulcer of right ankle with unspecified severity: Secondary | ICD-10-CM

## 2014-02-24 DIAGNOSIS — L539 Erythematous condition, unspecified: Secondary | ICD-10-CM

## 2014-02-27 NOTE — Progress Notes (Signed)
[name], male   DOB: 09/29/1929, 80 y.o.   MRN: [id number]  [name]     No Known Allergies     Chief Complaint  Patient presents with  . Acute Visit    right lower leg cellulitis     HPI:  He is developing a right lower extremity cellulitis. His leg is red; hot to touch. Nursing staff reports that his leg is worse than yesterday. He is followed by wound clinic. I spoke with Dr. Leanord Hawkingobson regarding the status of his wound and right lower leg. Will get a culture and will begin abt.   Past Medical History  Diagnosis Date  . 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 Hypertension   . Peripheral vascular disease   . Depression   . Anxiety   . GERD (gastroesophageal reflux disease)   . Renal insufficiency   . Dyslipidemia   . DDD (degenerative disc disease), lumbosacral   . History of colon polyps   . History of MRSA infection     Hx chronic diabetic ulcer's secondary MRSA  . Ulcer of right ankle   . Insulin dependent type 2 diabetes mellitus   . History of diabetic ulcer of foot   . Diabetic peripheral neuropathy   . Wheelchair dependent   . At high risk for falls   . Chronic diastolic congestive heart failure 06/13/2012    Echo in 2011 with grade 1 diastolic dysfunction and normal systolic function with EF 60%.       Past Surgical History  Procedure Laterality Date  . Laparoscopic cholecystectomy    . Inguinal hernia repair Left 11-30-2001  . Colonoscopy with esophagogastroduodenoscopy (egd)  09-21-2002  . Transthoracic echocardiogram  12-20-2009    severe LVH/  ef 60%/  grade I diastolic dysfunction/  AV sclerosis without stenosis/  mild LAE and RAE/    . Toe amputation    . Incision and drainage of wound Right 01/10/2014    Procedure: IRRIGATION AND DEBRIDEMENT RIGHT ANKLE WOUND WITH PLACEMENT OF ACELL;  Surgeon: Wayland Denislaire [name], DO;  Location: [facility];  Service: Plastics;  Laterality: Right;    VITAL SIGNS BP 140/50 mmHg   Pulse 72  Ht 6\' 2"  (1.88 m)  Wt 246 lb (111.585 kg)  BMI 31.57 kg/m2   Outpatient Encounter Prescriptions as of 02/15/2014  Medication Sig  . amLODipine (NORVASC) 5 MG tablet Take 5 mg by mouth every morning.   Marland Kitchen. aspirin 81 MG tablet Take 81 mg by mouth every morning.   Marland Kitchen. buPROPion (WELLBUTRIN) 75 MG tablet Take 75 mg by mouth 2 (two) times daily.   . citalopram (CELEXA) 20 MG tablet Take 20 mg by mouth every morning.   . dorzolamide-timolol (COSOPT) 22.3-6.8 MG/ML ophthalmic solution Place 1 drop into both eyes 2 (two) times daily.  . fenofibrate (TRICOR) 145 MG tablet Take 145 mg by mouth every evening.   . furosemide (LASIX) 40 MG tablet Take 40 mg by mouth 2 (two) times daily.   Marland Kitchen. gabapentin (NEURONTIN) 100 MG capsule Take 100 mg by mouth 2 (two) times daily.  . insulin aspart (NOVOLOG) 100 UNIT/ML injection Inject 8 Units into the skin 3 (three) times daily before meals. Give an additional 2 units prior to meals for cbg >200  . insulin glargine (LANTUS) 100 UNIT/ML injection Inject 28 Units into the skin at bedtime.   . isosorbide mononitrate (IMDUR) 60 MG 24 hr tablet Take 60 mg by mouth every morning.   .Marland Kitchen  latanoprost (XALATAN) 0.005 % ophthalmic solution Place 1 drop into both eyes at bedtime.    . lidocaine (LIDODERM) 5 % Place 1 patch onto the skin every 12 (twelve) hours. Remove & Discard patch within 12 hours to right neck  . LORazepam (ATIVAN) 0.5 MG tablet Take 0.5 mg by mouth every 6 (six) hours as needed for anxiety. Take 0.25mg   tablet by mouth once daily  . magnesium hydroxide (MILK OF MAGNESIA) 400 MG/5ML suspension Take by mouth daily as needed.    . Memantine HCl ER (NAMENDA XR) 14 MG CP24 Take 1 tablet by mouth every morning.  . metoprolol (LOPRESSOR) 50 MG tablet Take 50 mg by mouth 2 (two) times daily.  . Multiple Vitamins-Minerals (DECUBI-VITE) CAPS Take 1 capsule by mouth 2 (two) times daily.   Marland Kitchen. omeprazole (PRILOSEC) 20 MG capsule Take 20 mg by mouth every morning.    . ondansetron (ZOFRAN) 4 MG tablet Take 4 mg by mouth every 6 (six) hours as needed for nausea or vomiting.  Marland Kitchen. oxycodone (OXY-IR) 5 MG capsule Take one tablet by mouth every 8 hours as needed for pain.  . OxyCODONE HCl ER (OXYCONTIN) 30 MG T12A Take one tablet by mouth every 12 hours for pain  . polyethylene glycol powder (GLYCOLAX/MIRALAX) powder Take 17 g by mouth 2 (two) times daily.   . potassium chloride (K-DUR,KLOR-CON) 10 MEQ tablet Take 10 mEq by mouth every morning.   . senna (SENOKOT) 8.6 MG tablet Take 2 tablets by mouth 2 (two) times daily.      SIGNIFICANT DIAGNOSTIC EXAMS   LABS REVIEWED:   08-03-13: wbc 6. 5; hgb 10.6; hct 30.2; mcv 92.6; plt 96; glucose 185; bun 46 creat 2.57; k+ 3.8; na++142; liver normal albumin 3.9 pth 42.6; ca++ 9.5; EPO 31.9; vit d 27 09-01-13: glucose 224; bun 48; creat 2.38; k+ 3.7; na++140; hgb a1c 6.5  11-15-13: urine micro-albumin 17.73 12-03-13: glucose 174; bun 27; creat 1.71; k+3.8; na++140 hgb a1c 6.9       ROS Constitutional: Negative for malaise/fatigue.  Respiratory: Negative for cough and shortness of breath.   Cardiovascular: Negative for chest pain, palpitations and leg swelling.  Gastrointestinal: Negative for heartburn, abdominal pain and constipation.  Musculoskeletal: Negative for joint pain.  Skin:       Chronic ulcer right foot   Psychiatric/Behavioral: Negative for depression. The patient is not nervous/anxious.   Physical Exam  Constitutional: No distress.  Overweight   Neck: Neck supple. No JVD present.  Cardiovascular: Normal rate and regular rhythm.   Pedal pulses very faint   Respiratory: Effort normal and breath sounds normal. No respiratory distress. He has no wheezes.  GI: Soft. Bowel sounds are normal. He exhibits no distension. There is no tenderness.  Musculoskeletal: He exhibits no edema.  Is able to move all extremities   Neurological: He is alert.  Skin: Skin is warm and dry. He is not diaphoretic.    Right outer ankle ulcer with yellow slough present; no signs of infection present. Right lower leg red hot inflamed.     ASSESSMENT/ PLAN:  1. Right lower extremity cellulitis: will get a culture of wound on right outer ankle; will begin cipro 500 mg twice daily for 2 weeks with zyvox 600 mg twice daily for 2 weeks and will monitor his status.     Synthia Innocenteborah Mahad Newstrom NP [facility]  Contact [phone] Monday through Friday 8am- 5pm  After hours call [phone]

## 2014-02-28 ENCOUNTER — Other Ambulatory Visit: Payer: Self-pay | Admitting: Internal Medicine

## 2014-02-28 LAB — CBC
HCT: 25.5 % — ABNORMAL LOW (ref 39.0–52.0)
Hemoglobin: 8.5 g/dL — ABNORMAL LOW (ref 13.0–17.0)
MCH: 29 pg (ref 26.0–34.0)
MCHC: 33.3 g/dL (ref 30.0–36.0)
MCV: 87 fL (ref 78.0–100.0)
MPV: 8.4 fL — ABNORMAL LOW (ref 9.4–12.4)
Platelets: 158 10*3/uL (ref 150–400)
RBC: 2.93 MIL/uL — ABNORMAL LOW (ref 4.22–5.81)
RDW: 15.7 % — ABNORMAL HIGH (ref 11.5–15.5)
WBC: 8.8 10*3/uL (ref 4.0–10.5)

## 2014-03-04 NOTE — Progress Notes (Signed)
[name], male   DOB: 09-11-1929, 81 y.o.   MRN: [id number]  [name]     No Known Allergies     Chief Complaint  Patient presents with  . Acute Visit    follow up right ankle ulcer     HPI:  His cellulitis right lower leg is slightly improving with less redness present. His right ankle wound has increased slough present with increased drainage present. He has developed areas of petechiae on both feet. He states that he "doesn't feel good".   Past Medical History  Diagnosis Date  . 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 Hypertension   . Peripheral vascular disease   . Depression   . Anxiety   . GERD (gastroesophageal reflux disease)   . Renal insufficiency   . Dyslipidemia   . DDD (degenerative disc disease), lumbosacral   . History of colon polyps   . History of MRSA infection     Hx chronic diabetic ulcer's secondary MRSA  . Ulcer of right ankle   . Insulin dependent type 2 diabetes mellitus   . History of diabetic ulcer of foot   . Diabetic peripheral neuropathy   . Wheelchair dependent   . At high risk for falls   . Chronic diastolic congestive heart failure 06/13/2012    Echo in 2011 with grade 1 diastolic dysfunction and normal systolic function with EF 60%.       Past Surgical History  Procedure Laterality Date  . Laparoscopic cholecystectomy    . Inguinal hernia repair Left 11-30-2001  . Colonoscopy with esophagogastroduodenoscopy (egd)  09-21-2002  . Transthoracic echocardiogram  12-20-2009    severe LVH/  ef 60%/  grade I diastolic dysfunction/  AV sclerosis without stenosis/  mild LAE and RAE/    . Toe amputation    . Incision and drainage of wound Right 01/10/2014    Procedure: IRRIGATION AND DEBRIDEMENT RIGHT ANKLE WOUND WITH PLACEMENT OF ACELL;  Surgeon: Wayland Denis, DO;  Location: [facility];  Service: Plastics;  Laterality: Right;    VITAL SIGNS BP 138/68 mmHg  Pulse 79  Ht  (1.88 m)  Wt 246 lb  (111.585 kg)  BMI 31.57 kg/m2   Outpatient Encounter Prescriptions as of 02/24/2014  Medication Sig  . amLODipine (NORVASC) 5 MG tablet Take 5 mg by mouth every morning.   Marland Kitchen aspirin 81 MG tablet Take 81 mg by mouth every morning.   Marland Kitchen buPROPion (WELLBUTRIN) 75 MG tablet Take 75 mg by mouth 2 (two) times daily.   . citalopram (CELEXA) 20 MG tablet Take 20 mg by mouth every morning.   . dorzolamide-timolol (COSOPT) 22.3-6.8 MG/ML ophthalmic solution Place 1 drop into both eyes 2 (two) times daily.  . fenofibrate (TRICOR) 145 MG tablet Take 145 mg by mouth every evening.   . furosemide (LASIX) 40 MG tablet Take 40 mg by mouth 2 (two) times daily.   Marland Kitchen gabapentin (NEURONTIN) 100 MG capsule Take 100 mg by mouth 2 (two) times daily.  . insulin aspart (NOVOLOG) 100 UNIT/ML injection Inject 8 Units into the skin 3 (three) times daily before meals. Give an additional 2 units prior to meals for cbg >200  . insulin glargine (LANTUS) 100 UNIT/ML injection Inject 28 Units into the skin at bedtime.   . isosorbide mononitrate (IMDUR) 60 MG 24 hr tablet Take 60 mg by mouth every morning.   . latanoprost (XALATAN) 0.005 % ophthalmic solution Place 1 drop into both  eyes at bedtime.    . lidocaine (LIDODERM) 5 % Place 1 patch onto the skin every 12 (twelve) hours. Remove & Discard patch within 12 hours to right neck  . LORazepam (ATIVAN) 0.5 MG tablet Take 0.5 mg by mouth every 6 (six) hours as needed for anxiety. Take 0.25mg   tablet by mouth once daily  . magnesium hydroxide (MILK OF MAGNESIA) 400 MG/5ML suspension Take by mouth daily as needed.    . Memantine HCl ER (NAMENDA XR) 14 MG CP24 Take 1 tablet by mouth every morning.  . metoprolol (LOPRESSOR) 50 MG tablet Take 50 mg by mouth 2 (two) times daily.  . Multiple Vitamins-Minerals (DECUBI-VITE) CAPS Take 1 capsule by mouth 2 (two) times daily.   Marland Kitchen omeprazole (PRILOSEC) 20 MG capsule Take 20 mg by mouth every morning.   . ondansetron (ZOFRAN) 4 MG tablet  Take 4 mg by mouth every 6 (six) hours as needed for nausea or vomiting.  Marland Kitchen oxycodone (OXY-IR) 5 MG capsule Take one tablet by mouth every 8 hours as needed for pain.  . OxyCODONE HCl ER (OXYCONTIN) 30 MG T12A Take one tablet by mouth every 12 hours for pain  . polyethylene glycol powder (GLYCOLAX/MIRALAX) powder Take 17 g by mouth 2 (two) times daily.   . potassium chloride (K-DUR,KLOR-CON) 10 MEQ tablet Take 10 mEq by mouth every morning.   . senna (SENOKOT) 8.6 MG tablet Take 2 tablets by mouth 2 (two) times daily.      SIGNIFICANT DIAGNOSTIC EXAMS   LABS REVIEWED:   08-03-13: wbc 6. 5; hgb 10.6; hct 30.2; mcv 92.6; plt 96; glucose 185; bun 46 creat 2.57; k+ 3.8; na++142; liver normal albumin 3.9 pth 42.6; ca++ 9.5; EPO 31.9; vit d 27 09-01-13: glucose 224; bun 48; creat 2.38; k+ 3.7; na++140; hgb a1c 6.5  11-15-13: urine micro-albumin 17.73 12-03-13: glucose 174; bun 27; creat 1.71; k+3.8; na++140 hgb a1c 6.9 02-17-14: right ankle wound culture: mrsa: zyvox      ROS Constitutional: Negative for malaise/fatigue.  Respiratory: Negative for cough and shortness of breath.   Cardiovascular: Negative for chest pain, palpitations and leg swelling.  Gastrointestinal: Negative for heartburn, abdominal pain and constipation.  Musculoskeletal: Negative for joint pain.  Skin:       Chronic ulcer right foot   Psychiatric/Behavioral: Negative for depression. The patient is not nervous/anxious.   Physical Exam  Constitutional: No distress.  Overweight   Neck: Neck supple. No JVD present.  Cardiovascular: Normal rate and regular rhythm.   Pedal pulses very faint   Respiratory: Effort normal and breath sounds normal. No respiratory distress. He has no wheezes.  GI: Soft. Bowel sounds are normal. He exhibits no distension. There is no tenderness.  Musculoskeletal: He exhibits no edema.  Is able to move all extremities   Neurological: He is alert.  Skin: Skin is warm and dry. He is not  diaphoretic.  Right outer ankle ulcer with increased slough and drainage present. The cellulitis on the right lower leg is improving Left great and 2nd toes with bloody open area Petechiae on bilateral feet in small scattered areas  ASSESSMENT/ PLAN:  1. Cellulitis right leg 2. Right ankle ulcer Will setup a mri right foot and ankle for osteomyelitis of right ankle  Will continue zyvox and will stop the cipro  Will continue to monitor his status    Synthia Innocent NP [facility]  Contact [phone] Monday through Friday 8am- 5pm  After hours call [phone]

## 2014-03-07 ENCOUNTER — Encounter (HOSPITAL_BASED_OUTPATIENT_CLINIC_OR_DEPARTMENT_OTHER): Payer: Medicare Other | Attending: Plastic Surgery

## 2014-03-07 DIAGNOSIS — L97512 Non-pressure chronic ulcer of other part of right foot with fat layer exposed: Secondary | ICD-10-CM | POA: Insufficient documentation

## 2014-03-07 DIAGNOSIS — I872 Venous insufficiency (chronic) (peripheral): Secondary | ICD-10-CM | POA: Insufficient documentation

## 2014-03-07 DIAGNOSIS — E11621 Type 2 diabetes mellitus with foot ulcer: Secondary | ICD-10-CM | POA: Insufficient documentation

## 2014-03-11 ENCOUNTER — Telehealth: Payer: Self-pay | Admitting: *Deleted

## 2014-03-11 NOTE — Telephone Encounter (Signed)
Tasha with [facility] called needing an order for an MRI that is scheduled for Monday. Called [facility]

## 2014-03-14 ENCOUNTER — Other Ambulatory Visit: Payer: Self-pay | Admitting: Internal Medicine

## 2014-03-14 ENCOUNTER — Non-Acute Institutional Stay (SKILLED_NURSING_FACILITY): Payer: Medicare Other | Admitting: Adult Health

## 2014-03-14 ENCOUNTER — Ambulatory Visit (HOSPITAL_COMMUNITY): Admission: RE | Admit: 2014-03-14 | Payer: Medicare Other | Source: Ambulatory Visit

## 2014-03-14 ENCOUNTER — Ambulatory Visit (HOSPITAL_COMMUNITY)
Admission: RE | Admit: 2014-03-14 | Discharge: 2014-03-14 | Disposition: A | Discharge status: Home / Self Care | Payer: Medicare Other | Source: Ambulatory Visit | Attending: Internal Medicine | Admitting: Internal Medicine

## 2014-03-14 DIAGNOSIS — G629 Polyneuropathy, unspecified: Secondary | ICD-10-CM

## 2014-03-14 DIAGNOSIS — I83019 Varicose veins of right lower extremity with ulcer of unspecified site: Secondary | ICD-10-CM

## 2014-03-14 DIAGNOSIS — E785 Hyperlipidemia, unspecified: Secondary | ICD-10-CM

## 2014-03-14 DIAGNOSIS — L539 Erythematous condition, unspecified: Secondary | ICD-10-CM

## 2014-03-14 DIAGNOSIS — E1151 Type 2 diabetes mellitus with diabetic peripheral angiopathy without gangrene: Secondary | ICD-10-CM

## 2014-03-14 DIAGNOSIS — L97919 Non-pressure chronic ulcer of unspecified part of right lower leg with unspecified severity: Secondary | ICD-10-CM | POA: Diagnosis not present

## 2014-03-14 DIAGNOSIS — K5903 Drug induced constipation: Secondary | ICD-10-CM

## 2014-03-14 DIAGNOSIS — E1159 Type 2 diabetes mellitus with other circulatory complications: Secondary | ICD-10-CM

## 2014-03-14 DIAGNOSIS — L02419 Cutaneous abscess of limb, unspecified: Secondary | ICD-10-CM

## 2014-03-14 DIAGNOSIS — I83018 Varicose veins of right lower extremity with ulcer other part of lower leg: Secondary | ICD-10-CM

## 2014-03-14 DIAGNOSIS — T50905A Adverse effect of unspecified drugs, medicaments and biological substances, initial encounter: Secondary | ICD-10-CM

## 2014-03-14 DIAGNOSIS — I83013 Varicose veins of right lower extremity with ulcer of ankle: Secondary | ICD-10-CM

## 2014-03-14 DIAGNOSIS — L97909 Non-pressure chronic ulcer of unspecified part of unspecified lower leg with unspecified severity: Secondary | ICD-10-CM | POA: Diagnosis present

## 2014-03-14 DIAGNOSIS — L97929 Non-pressure chronic ulcer of unspecified part of left lower leg with unspecified severity: Secondary | ICD-10-CM | POA: Insufficient documentation

## 2014-03-14 DIAGNOSIS — R600 Localized edema: Secondary | ICD-10-CM

## 2014-03-14 DIAGNOSIS — S82891A Other fracture of right lower leg, initial encounter for closed fracture: Secondary | ICD-10-CM

## 2014-03-14 DIAGNOSIS — G8929 Other chronic pain: Secondary | ICD-10-CM

## 2014-03-14 DIAGNOSIS — I119 Hypertensive heart disease without heart failure: Secondary | ICD-10-CM

## 2014-03-14 DIAGNOSIS — F0150 Vascular dementia without behavioral disturbance: Secondary | ICD-10-CM

## 2014-03-14 DIAGNOSIS — L97309 Non-pressure chronic ulcer of unspecified ankle with unspecified severity: Secondary | ICD-10-CM | POA: Insufficient documentation

## 2014-03-14 DIAGNOSIS — D649 Anemia, unspecified: Secondary | ICD-10-CM

## 2014-03-14 DIAGNOSIS — I5032 Chronic diastolic (congestive) heart failure: Secondary | ICD-10-CM

## 2014-03-14 DIAGNOSIS — I83011 Varicose veins of right lower extremity with ulcer of thigh: Secondary | ICD-10-CM

## 2014-03-14 DIAGNOSIS — I83012 Varicose veins of right lower extremity with ulcer of calf: Secondary | ICD-10-CM

## 2014-03-14 DIAGNOSIS — M25571 Pain in right ankle and joints of right foot: Secondary | ICD-10-CM

## 2014-03-14 DIAGNOSIS — K5909 Other constipation: Secondary | ICD-10-CM

## 2014-03-14 DIAGNOSIS — E1142 Type 2 diabetes mellitus with diabetic polyneuropathy: Secondary | ICD-10-CM

## 2014-03-14 DIAGNOSIS — K219 Gastro-esophageal reflux disease without esophagitis: Secondary | ICD-10-CM

## 2014-03-14 DIAGNOSIS — I83015 Varicose veins of right lower extremity with ulcer other part of foot: Secondary | ICD-10-CM

## 2014-03-14 DIAGNOSIS — H409 Unspecified glaucoma: Secondary | ICD-10-CM

## 2014-03-14 DIAGNOSIS — I83014 Varicose veins of right lower extremity with ulcer of heel and midfoot: Secondary | ICD-10-CM

## 2014-03-14 LAB — BASIC METABOLIC PANEL
BUN: 25 mg/dL — ABNORMAL HIGH (ref 6–23)
CO2: 28 mEq/L (ref 19–32)
Calcium: 9.3 mg/dL (ref 8.4–10.5)
Chloride: 102 mEq/L (ref 96–112)
Creat: 1.75 mg/dL — ABNORMAL HIGH (ref 0.50–1.35)
Glucose, Bld: 81 mg/dL (ref 70–99)
Potassium: 3.9 mEq/L (ref 3.5–5.3)
Sodium: 141 mEq/L (ref 135–145)

## 2014-03-14 LAB — HEMOGLOBIN A1C
Hgb A1c MFr Bld: 7.1 % — ABNORMAL HIGH (ref <5.7)
Mean Plasma Glucose: 157 mg/dL — ABNORMAL HIGH (ref <117)

## 2014-03-21 DIAGNOSIS — L97512 Non-pressure chronic ulcer of other part of right foot with fat layer exposed: Secondary | ICD-10-CM | POA: Diagnosis not present

## 2014-03-21 DIAGNOSIS — I872 Venous insufficiency (chronic) (peripheral): Secondary | ICD-10-CM | POA: Diagnosis not present

## 2014-03-21 DIAGNOSIS — E11621 Type 2 diabetes mellitus with foot ulcer: Secondary | ICD-10-CM | POA: Diagnosis not present

## 2014-03-22 NOTE — Progress Notes (Signed)
[facility]  NAME:  [name], Leven                       ACCOUNT NO.:  MEDICAL RECORD NO.:  [id number]      DATE OF BIRTH:  May 17, 1929  PHYSICIAN:  Wayland Denislaire [name], DO       VISIT DATE:  03/21/2014                                  OFFICE VISIT   The patient is an 81 year old male, here for a followup on his right lower extremity lateral ankle ulcer. He now has a few blood blisters on his toes and 1 on the lateral part of his foot.  None of them look infected and overall it does appear to be looking a little bit better. He has been using KY jelly on the area because he had A-Cell placed. There has been no change in his medications.  He is still at a nursing facility.  On exam, he is alert and oriented, cooperative, not in any distress.  He is very pleasant like usual.  His breathing is unlabored.  His heart rate is regular.  His pulses present.  The area does not appear infected at all and it is showing some signs of improvement.  The bone seems to be covered.  Debridement was done and we will put collagen on the area with an Ace wrap.  Multivitamin, vitamin C, zinc, elevation, and see him back in 3 weeks.  We have also talked him about trying to stay off it with pressure so that he will heal.     Wayland Denislaire [name], DO     CS/MEDQ  D:  03/21/2014  T:  03/21/2014  Job:  [id number]

## 2014-03-25 ENCOUNTER — Encounter: Payer: Self-pay | Admitting: Adult Health

## 2014-03-25 NOTE — Progress Notes (Signed)
[name], male   DOB: 18-Vanbrocklin-1931, 81 y.o.   MRN: [id number]  [name] living [place]      No Known Allergies     Chief Complaint  Patient presents with  . Medical Management of Chronic Issues    HPI:  He is a long term resident of this facility being seen for the management of his chronic illnesses. He has completed abt for his right lower leg cellulitis. He has had the mri done today and is negative for osteomyelitis. His cbg's become more elevated as the day progresses. His anemia is worse with his first guaiac negative.     Past Medical History  Diagnosis Date  . 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 Hypertension   . Peripheral vascular disease   . Depression   . Anxiety   . GERD (gastroesophageal reflux disease)   . Renal insufficiency   . Dyslipidemia   . DDD (degenerative disc disease), lumbosacral   . History of colon polyps   . History of MRSA infection     Hx chronic diabetic ulcer's secondary MRSA  . Ulcer of right ankle   . Insulin dependent type 2 diabetes mellitus   . History of diabetic ulcer of foot   . Diabetic peripheral neuropathy   . Wheelchair dependent   . At high risk for falls   . Chronic diastolic congestive heart failure 06/13/2012    Echo in 2011 with grade 1 diastolic dysfunction and normal systolic function with EF 60%.       Past Surgical History  Procedure Laterality Date  . Laparoscopic cholecystectomy    . Inguinal hernia repair Left 11-30-2001  . Colonoscopy with esophagogastroduodenoscopy (egd)  09-21-2002  . Transthoracic echocardiogram  12-20-2009    severe LVH/  ef 60%/  grade I diastolic dysfunction/  AV sclerosis without stenosis/  mild LAE and RAE/    . Toe amputation    . Incision and drainage of wound Right 01/10/2014    Procedure: IRRIGATION AND DEBRIDEMENT RIGHT ANKLE WOUND WITH PLACEMENT OF ACELL;  Surgeon: Wayland Denis, DO;  Location: [facility];  Service: Plastics;  Laterality:  Right;    VITAL SIGNS BP 136/66 mmHg  Pulse 68  Ht  (1.88 m)  Wt 244 lb 12.8 oz (111.041 kg)  BMI 31.42 kg/m2   Outpatient Encounter Prescriptions as of 03/14/2014  Medication Sig  . amLODipine (NORVASC) 5 MG tablet Take 5 mg by mouth every morning.   Marland Kitchen aspirin 81 MG tablet Take 81 mg by mouth every morning.   Marland Kitchen buPROPion (WELLBUTRIN) 75 MG tablet Take 75 mg by mouth 2 (two) times daily.   . citalopram (CELEXA) 20 MG tablet Take 20 mg by mouth every morning.   . dorzolamide-timolol (COSOPT) 22.3-6.8 MG/ML ophthalmic solution Place 1 drop into both eyes 2 (two) times daily.  . fenofibrate (TRICOR) 145 MG tablet Take 145 mg by mouth every evening.   . furosemide (LASIX) 40 MG tablet Take 40 mg by mouth 2 (two) times daily.   Marland Kitchen gabapentin (NEURONTIN) 100 MG capsule Take 100 mg by mouth 2 (two) times daily.  . insulin aspart (NOVOLOG) 100 UNIT/ML injection Inject 8 Units into the skin 3 (three) times daily before meals. Give an additional 2 units prior to meals for cbg >200  . insulin glargine (LANTUS) 100 UNIT/ML injection Inject 28 Units into the skin at bedtime.   . isosorbide mononitrate (IMDUR) 60 MG 24 hr tablet Take 60 mg  by mouth every morning.   . latanoprost (XALATAN) 0.005 % ophthalmic solution Place 1 drop into both eyes at bedtime.    . lidocaine (LIDODERM) 5 % Place 1 patch onto the skin every 12 (twelve) hours. Remove & Discard patch within 12 hours to right neck  . LORazepam (ATIVAN) 0.5 MG tablet Take 0.5 mg by mouth every 6 (six) hours as needed for anxiety. Take 0.25mg   tablet by mouth once daily  . magnesium hydroxide (MILK OF MAGNESIA) 400 MG/5ML suspension Take by mouth daily as needed.    . Memantine HCl ER (NAMENDA XR) 14 MG CP24 Take 1 tablet by mouth every morning.  . metoprolol (LOPRESSOR) 50 MG tablet Take 50 mg by mouth 2 (two) times daily.  . Multiple Vitamins-Minerals (DECUBI-VITE) CAPS Take 1 capsule by mouth 2 (two) times daily.   Marland Kitchen. omeprazole  (PRILOSEC) 20 MG capsule Take 20 mg by mouth every morning.   . ondansetron (ZOFRAN) 4 MG tablet Take 4 mg by mouth every 6 (six) hours as needed for nausea or vomiting.  Marland Kitchen. oxycodone (OXY-IR) 5 MG capsule Take one tablet by mouth every 8 hours as needed for pain.  . OxyCODONE HCl ER (OXYCONTIN) 30 MG T12A Take one tablet by mouth every 12 hours for pain  . polyethylene glycol powder (GLYCOLAX/MIRALAX) powder Take 17 g by mouth 2 (two) times daily.   . potassium chloride (K-DUR,KLOR-CON) 10 MEQ tablet Take 10 mEq by mouth every morning.   . senna (SENOKOT) 8.6 MG tablet Take 2 tablets by mouth 2 (two) times daily.      SIGNIFICANT DIAGNOSTIC EXAMS  03-14-14: right ankle tib/fib mri: 1. 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 2. T2 hyperintense probable draining abscess deep to ulceration. 3. Ankle effusion. This is probably reactive. Septic arthritis is less likely. 4. Infectious tenosynovitis of the peroneal tendons. 5. No abscess in the proximal RIGHT leg.    LABS REVIEWED:   08-03-13: wbc 6. 5; hgb 10.6; hct 30.2; mcv 92.6; plt 96; glucose 185; bun 46 creat 2.57; k+ 3.8; na++142; liver normal albumin 3.9 pth 42.6; ca++ 9.5; EPO 31.9; vit d 27 09-01-13: glucose 224; bun 48; creat 2.38; k+ 3.7; na++140; hgb a1c 6.5  11-15-13: urine micro-albumin 17.73 12-03-13: glucose 174; bun 27; creat 1.71; k+3.8; na++140 hgb a1c 6.9 02-17-14: right ankle wound culture: mrsa: zyvox  02-28-14: wbc 8.8; hgb 8.5; hct 25.5; mcv 87.1; plt 158        ROS Constitutional: Negative for malaise/fatigue.  Respiratory: Negative for cough and shortness of breath.   Cardiovascular: Negative for chest pain, palpitations and leg swelling.  Gastrointestinal: Negative for heartburn, abdominal pain and constipation.  Musculoskeletal: Negative for joint pain.  Skin:       Chronic ulcer right foot   Psychiatric/Behavioral: Negative for depression. The patient is  not nervous/anxious.      Physical Exam Constitutional: No distress.  Overweight   Neck: Neck supple. No JVD present.  Cardiovascular: Normal rate and regular rhythm.   Pedal pulses very faint   Respiratory: Effort normal and breath sounds normal. No respiratory distress. He has no wheezes.  GI: Soft. Bowel sounds are normal. He exhibits no distension. There is no tenderness.  Musculoskeletal: He exhibits no edema.  Is able to move all extremities   Neurological: He is alert.  Skin: Skin is warm and dry. He is not diaphoretic.  Right outer ankle ulcer followed by wound care Right lower leg cellulitis resolved  ASSESSMENT/ PLAN:   1. Hypertension: stable will continue norvasc 5 mg daily lopressor 50 mg twice daily and will monitor   2. Chronic diastolic heart failure: is stable will continue lasix 40 mg twice daily with k+ 10 meq daily; imdur 60 mg daily and will monitor  3. Gerd: will continue prilosec 20 mg daily  4.  Constipation: will continue miralax twice daily; senna 2 tabs twice daily   5. Diabetes: will continue lantus 28 units daily and change the novolog to 3 units prior to lunch and supper with 8 units prior to meals for cbg >=150   6. Depression with anxiety: is stable will continue celexa 20 mg daily; wellbutrin 75 mg twice daily and ativan 0.25 mg daily and 0.5 mg twice daily as needed   7. Vascular dementia: is without change; will continue namenda xr 14 mg daily and will monitor  8. Dyslipidemia: will continue tricor 145 mg daily   9. Chronic pain: his pain is presently being managed will continue oxycontin 30 mg twice daily; neurontin 100 mg twice daily; lidoderm patch to his right neck daily; and oxycodone 5 mg every 8 hours as needed   10. Glaucoma; will continue latanoprost to both eyes nightly and cosopt to both eyes twice daily   11. Stasis leg ulcer: goes to wound clinic to right outer ankle ulcer; will continue to monitor his status.   12.  Anemia: his hgb is 8.5; his first guaiac is negative; will await the final 2 and will monitor     Synthia Innocent NP [facility]  Contact [phone] Monday through Friday 8am- 5pm  After hours call [phone]

## 2014-04-11 ENCOUNTER — Encounter (HOSPITAL_BASED_OUTPATIENT_CLINIC_OR_DEPARTMENT_OTHER): Payer: Medicare Other | Attending: Plastic Surgery

## 2014-04-11 DIAGNOSIS — E11621 Type 2 diabetes mellitus with foot ulcer: Secondary | ICD-10-CM | POA: Diagnosis present

## 2014-04-11 DIAGNOSIS — L97519 Non-pressure chronic ulcer of other part of right foot with unspecified severity: Secondary | ICD-10-CM | POA: Diagnosis not present

## 2014-04-11 DIAGNOSIS — L97312 Non-pressure chronic ulcer of right ankle with fat layer exposed: Secondary | ICD-10-CM | POA: Diagnosis not present

## 2014-04-12 NOTE — Progress Notes (Signed)
[facility]  NAME:  [name], [name]                  ACCOUNT NO.:  [id number]  MEDICAL RECORD NO.:  [id number]      DATE OF BIRTH:  06-27-1929  PHYSICIAN:  Wayland Denislaire [name], DO       VISIT DATE:  04/11/2014                                  OFFICE VISIT   The patient is an 81 year old male, very pleasant.  He is here for followup on his right lateral ankle ulcer.  He has an ulcer on the lateral portion of his foot, mostly likely to pressure from lying his foot on that area.  He is alert and oriented, very pleasant as usual. There is no sign of infection.  The swelling has improved a little bit. There seems to be some granulation tissue, but minimal.  We will continue with collagen, bag balm to the leg, and followup in 2 to 3 weeks.     Wayland Denislaire [name], DO     CS/MEDQ  D:  04/11/2014  T:  04/12/2014  Job:  [id number]

## 2014-04-13 ENCOUNTER — Other Ambulatory Visit: Payer: Self-pay | Admitting: *Deleted

## 2014-04-13 MED ORDER — OXYCODONE HCL 5 MG PO CAPS: ORAL_CAPSULE | ORAL | Status: DC

## 2014-04-13 NOTE — Telephone Encounter (Signed)
[facility] 

## 2014-04-25 ENCOUNTER — Non-Acute Institutional Stay (SKILLED_NURSING_FACILITY): Payer: Medicare Other | Admitting: Adult Health

## 2014-04-25 DIAGNOSIS — D649 Anemia, unspecified: Secondary | ICD-10-CM

## 2014-04-25 DIAGNOSIS — K5903 Drug induced constipation: Secondary | ICD-10-CM

## 2014-04-25 DIAGNOSIS — T50905A Adverse effect of unspecified drugs, medicaments and biological substances, initial encounter: Secondary | ICD-10-CM | POA: Diagnosis not present

## 2014-04-25 DIAGNOSIS — F418 Other specified anxiety disorders: Secondary | ICD-10-CM

## 2014-04-25 DIAGNOSIS — E785 Hyperlipidemia, unspecified: Secondary | ICD-10-CM | POA: Diagnosis not present

## 2014-04-25 DIAGNOSIS — K219 Gastro-esophageal reflux disease without esophagitis: Secondary | ICD-10-CM

## 2014-04-25 DIAGNOSIS — L97409 Non-pressure chronic ulcer of unspecified heel and midfoot with unspecified severity: Secondary | ICD-10-CM

## 2014-04-25 DIAGNOSIS — E1151 Type 2 diabetes mellitus with diabetic peripheral angiopathy without gangrene: Secondary | ICD-10-CM

## 2014-04-25 DIAGNOSIS — K5909 Other constipation: Secondary | ICD-10-CM | POA: Diagnosis not present

## 2014-04-25 DIAGNOSIS — E1159 Type 2 diabetes mellitus with other circulatory complications: Secondary | ICD-10-CM

## 2014-04-25 DIAGNOSIS — I119 Hypertensive heart disease without heart failure: Secondary | ICD-10-CM | POA: Diagnosis not present

## 2014-04-25 DIAGNOSIS — G8929 Other chronic pain: Secondary | ICD-10-CM

## 2014-04-25 DIAGNOSIS — I5032 Chronic diastolic (congestive) heart failure: Secondary | ICD-10-CM | POA: Diagnosis not present

## 2014-05-02 ENCOUNTER — Other Ambulatory Visit: Payer: Self-pay | Admitting: *Deleted

## 2014-05-02 MED ORDER — OXYCODONE HCL ER 30 MG PO T12A: EXTENDED_RELEASE_TABLET | ORAL | Status: DC

## 2014-05-02 NOTE — Telephone Encounter (Signed)
[facility] #[phone] Fax [phone] 

## 2014-05-12 ENCOUNTER — Encounter: Payer: Self-pay | Admitting: Adult Health

## 2014-05-12 NOTE — Progress Notes (Signed)
[name], male   DOB: 10-12-1929, 81 y.o.   MRN: [id number]  [name] living [place]     No Known Allergies     Chief Complaint  Patient presents with  . Medical Management of Chronic Issues    HPI:  He is a long term resident of this facility being seen for the management of his chronic illnesses. Overall his status is stable. He is not voicing any complaints or concerns today. There are no nursing concerns today,   Past Medical History  Diagnosis Date  . 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 Hypertension   . Peripheral vascular disease   . Depression   . Anxiety   . GERD (gastroesophageal reflux disease)   . Renal insufficiency   . Dyslipidemia   . DDD (degenerative disc disease), lumbosacral   . History of colon polyps   . History of MRSA infection     Hx chronic diabetic ulcer's secondary MRSA  . Ulcer of right ankle   . Insulin dependent type 2 diabetes mellitus   . History of diabetic ulcer of foot   . Diabetic peripheral neuropathy   . Wheelchair dependent   . At high risk for falls   . Chronic diastolic congestive heart failure 06/13/2012    Echo in 2011 with grade 1 diastolic dysfunction and normal systolic function with EF 60%.       Past Surgical History  Procedure Laterality Date  . Laparoscopic cholecystectomy    . Inguinal hernia repair Left 11-30-2001  . Colonoscopy with esophagogastroduodenoscopy (egd)  09-21-2002  . Transthoracic echocardiogram  12-20-2009    severe LVH/  ef 60%/  grade I diastolic dysfunction/  AV sclerosis without stenosis/  mild LAE and RAE/    . Toe amputation    . Incision and drainage of wound Right 01/10/2014    Procedure: IRRIGATION AND DEBRIDEMENT RIGHT ANKLE WOUND WITH PLACEMENT OF ACELL;  Surgeon: Wayland Denis, DO;  Location: [facility];  Service: Plastics;  Laterality: Right;    VITAL SIGNS BP 154/68 mmHg  Pulse 68  Ht  (1.778 m)  Wt 240 lb (108.863 kg)  BMI 34.44  kg/m2   Outpatient Encounter Prescriptions as of 04/25/2014  Medication Sig  . amLODipine (NORVASC) 5 MG tablet Take 5 mg by mouth every morning.   Marland Kitchen aspirin 81 MG tablet Take 81 mg by mouth every morning.   Marland Kitchen buPROPion (WELLBUTRIN) 75 MG tablet Take 75 mg by mouth 2 (two) times daily.   . citalopram (CELEXA) 20 MG tablet Take 20 mg by mouth every morning.   . dorzolamide-timolol (COSOPT) 22.3-6.8 MG/ML ophthalmic solution Place 1 drop into both eyes 2 (two) times daily.  . fenofibrate (TRICOR) 145 MG tablet Take 145 mg by mouth every evening.   . furosemide (LASIX) 40 MG tablet Take 40 mg by mouth 2 (two) times daily.   Marland Kitchen gabapentin (NEURONTIN) 100 MG capsule Take 100 mg by mouth 2 (two) times daily.  . insulin aspart (NOVOLOG) 100 UNIT/ML injection Inject 8 Units into the skin 3 (three) times daily before meals. Give an additional 2 units prior to meals for cbg >200  . insulin glargine (LANTUS) 100 UNIT/ML injection Inject 28 Units into the skin at bedtime.   . isosorbide mononitrate (IMDUR) 60 MG 24 hr tablet Take 60 mg by mouth every morning.   . latanoprost (XALATAN) 0.005 % ophthalmic solution Place 1 drop into both eyes at bedtime.    Marland Kitchen  lidocaine (LIDODERM) 5 % Place 1 patch onto the skin every 12 (twelve) hours. Remove & Discard patch within 12 hours to right neck  . LORazepam (ATIVAN) 0.5 MG tablet Take 0.5 mg by mouth every 6 (six) hours as needed for anxiety. Take 0.25mg   tablet by mouth once daily  . magnesium hydroxide (MILK OF MAGNESIA) 400 MG/5ML suspension Take by mouth daily as needed.    . Memantine HCl ER (NAMENDA XR) 14 MG CP24 Take 1 tablet by mouth every morning.  . metoprolol (LOPRESSOR) 50 MG tablet Take 50 mg by mouth 2 (two) times daily.  . Multiple Vitamins-Minerals (DECUBI-VITE) CAPS Take 1 capsule by mouth 2 (two) times daily.   Marland Kitchen. omeprazole (PRILOSEC) 20 MG capsule Take 20 mg by mouth every morning.   . ondansetron (ZOFRAN) 4 MG tablet Take 4 mg by mouth every 6  (six) hours as needed for nausea or vomiting.  Marland Kitchen. oxycodone (OXY-IR) 5 MG capsule Take one tablet by mouth every 8 hours as needed for pain.  . OxyCODONE HCl ER (OXYCONTIN) 30 MG T12A Take one tablet by mouth every 12 hours for pain  . polyethylene glycol powder (GLYCOLAX/MIRALAX) powder Take 17 g by mouth 2 (two) times daily.   . potassium chloride (K-DUR,KLOR-CON) 10 MEQ tablet Take 10 mEq by mouth every morning.   . senna (SENOKOT) 8.6 MG tablet Take 2 tablets by mouth 2 (two) times daily.      SIGNIFICANT DIAGNOSTIC EXAMS   03-14-14: right ankle tib/fib mri: 1. 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 2. T2 hyperintense probable draining abscess deep to ulceration. 3. Ankle effusion. This is probably reactive. Septic arthritis is less likely. 4. Infectious tenosynovitis of the peroneal tendons. 5. No abscess in the proximal RIGHT leg.    LABS REVIEWED:   08-03-13: wbc 6. 5; hgb 10.6; hct 30.2; mcv 92.6; plt 96; glucose 185; bun 46 creat 2.57; k+ 3.8; na++142; liver normal albumin 3.9 pth 42.6; ca++ 9.5; EPO 31.9; vit d 27 09-01-13: glucose 224; bun 48; creat 2.38; k+ 3.7; na++140; hgb a1c 6.5  11-15-13: urine micro-albumin 17.73 12-03-13: glucose 174; bun 27; creat 1.71; k+3.8; na++140 hgb a1c 6.9 02-17-14: right ankle wound culture: mrsa: zyvox  02-28-14: wbc 8.8; hgb 8.5; hct 25.5; mcv 87.1; plt 158        ROS Constitutional: Negative for malaise/fatigue.  Respiratory: Negative for cough and shortness of breath.   Cardiovascular: Negative for chest pain, palpitations and leg swelling.  Gastrointestinal: Negative for heartburn, abdominal pain and constipation.  Musculoskeletal: Negative for joint pain.  Skin:       Chronic ulcer right foot   Psychiatric/Behavioral: Negative for depression. The patient is not nervous/anxious.      Physical Exam Constitutional: No distress.  Overweight   Neck: Neck supple. No JVD  present.  Cardiovascular: Normal rate and regular rhythm.   Pedal pulses very faint   Respiratory: Effort normal and breath sounds normal. No respiratory distress. He has no wheezes.  GI: Soft. Bowel sounds are normal. He exhibits no distension. There is no tenderness.  Musculoskeletal: He exhibits no edema.  Is able to move all extremities   Neurological: He is alert.  Skin: Skin is warm and dry. He is not diaphoretic.  Right outer ankle ulcer followed by wound care    ASSESSMENT/ PLAN:   1. Hypertension: will continue norvasc 5 mg daily lopressor 50 mg twice daily will begin lisinopril 2.5 mg daily and will check  bmp in one week; will have nursing check blood pressure twice daily and will monitor   2. Chronic diastolic heart failure: is stable will continue lasix 40 mg twice daily with k+ 10 meq daily; imdur 60 mg daily and will monitor  3. Gerd: will continue prilosec 20 mg daily  4.  Constipation: will continue miralax twice daily; senna 2 tabs twice daily   5. Diabetes: will continue lantus 28 units daily and change the novolog to 3 units prior to lunch and supper with 8 units prior to meals for cbg >=150   6. Depression with anxiety: is stable will continue celexa 20 mg daily; wellbutrin 75 mg twice daily and ativan 0.25 mg daily and 0.5 mg twice daily as needed   7. Vascular dementia: is without change; will continue namenda xr 14 mg daily and will monitor  8. Dyslipidemia: will continue tricor 145 mg daily   9. Chronic pain: his pain is presently being managed will continue oxycontin 30 mg twice daily; neurontin 100 mg twice daily; lidoderm patch to his right neck daily; and oxycodone 5 mg every 8 hours as needed   10. Glaucoma; will continue latanoprost to both eyes nightly and cosopt to both eyes twice daily   11. Stasis leg ulcer: goes to wound clinic to right outer ankle ulcer; will continue to monitor his status.   12. Anemia: his hgb is 8.5; will monitor       ROS   Physical Exam     ASSESSMENT/ PLAN:  1. Hypertension: stable will continue norvasc 5 mg daily lopressor 50 mg twice daily and will monitor   2. Chronic diastolic heart failure: is stable will continue lasix 40 mg twice daily with k+ 10 meq daily; imdur 60 mg daily and will monitor  3. Gerd: will continue prilosec 20 mg daily  4.  Constipation: will continue miralax twice daily; senna 2 tabs twice daily   5. Diabetes: will continue lantus 28 units daily and change the novolog to 3 units prior to lunch and supper with 8 units prior to meals for cbg >=150   6. Depression with anxiety: is stable will continue celexa 20 mg daily; wellbutrin 75 mg twice daily and ativan 0.25 mg daily and 0.5 mg twice daily as needed   7. Vascular dementia: is without change; will continue namenda xr 14 mg daily and will monitor  8. Dyslipidemia: will continue tricor 145 mg daily   9. Chronic pain: his pain is presently being managed will continue oxycontin 30 mg twice daily; neurontin 100 mg twice daily; lidoderm patch to his right neck daily; and oxycodone 5 mg every 8 hours as needed   10. Glaucoma; will continue latanoprost to both eyes nightly and cosopt to both eyes twice daily   11. Stasis leg ulcer: goes to wound clinic to right outer ankle ulcer; will continue to monitor his status.   12. Anemia: his hgb is 8.5; his first guaiac is negative; will await the final 2 and will monitor    Synthia Innocent NP [facility]  Contact [phone] Monday through Friday 8am- 5pm  After hours call [phone]

## 2014-05-13 ENCOUNTER — Non-Acute Institutional Stay (SKILLED_NURSING_FACILITY): Payer: Medicare Other | Admitting: Adult Health

## 2014-05-13 DIAGNOSIS — L03115 Cellulitis of right lower limb: Secondary | ICD-10-CM | POA: Diagnosis not present

## 2014-05-23 ENCOUNTER — Non-Acute Institutional Stay (SKILLED_NURSING_FACILITY): Payer: Medicare Other | Admitting: Adult Health

## 2014-05-23 DIAGNOSIS — H109 Unspecified conjunctivitis: Secondary | ICD-10-CM | POA: Diagnosis not present

## 2014-05-23 NOTE — Progress Notes (Signed)
[name], male   DOB: [birth year], 81 y.o.   MRN: [id number]  [name] living [place]     No Known Allergies     Chief Complaint  Patient presents with  . Acute Visit    bilateral eye redness     HPI:  Both eyes are swollen with redness present. There is green drainage coming from both eyes.  He states that they are painful. There are no reports of fever present.    Past Medical History  Diagnosis Date  . 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 Hypertension   . Peripheral vascular disease   . Depression   . Anxiety   . GERD (gastroesophageal reflux disease)   . Renal insufficiency   . Dyslipidemia   . DDD (degenerative disc disease), lumbosacral   . History of colon polyps   . History of MRSA infection     Hx chronic diabetic ulcer's secondary MRSA  . Ulcer of right ankle   . Insulin dependent type 2 diabetes mellitus   . History of diabetic ulcer of foot   . Diabetic peripheral neuropathy   . Wheelchair dependent   . At high risk for falls   . Chronic diastolic congestive heart failure 06/13/2012    Echo in 2011 with grade 1 diastolic dysfunction and normal systolic function with EF 60%.       Past Surgical History  Procedure Laterality Date  . Laparoscopic cholecystectomy    . Inguinal hernia repair Left 11-30-2001  . Colonoscopy with esophagogastroduodenoscopy (egd)  09-21-2002  . Transthoracic echocardiogram  12-20-2009    severe LVH/  ef 60%/  grade I diastolic dysfunction/  AV sclerosis without stenosis/  mild LAE and RAE/    . Toe amputation    . Incision and drainage of wound Right 01/10/2014    Procedure: IRRIGATION AND DEBRIDEMENT RIGHT ANKLE WOUND WITH PLACEMENT OF ACELL;  Surgeon: Wayland Denis, DO;  Location: [facility];  Service: Plastics;  Laterality: Right;    VITAL SIGNS BP 150/80 mmHg  Pulse 60  Ht  (1.778 m)  Wt 239 lb (108.41 kg)  BMI 34.29 kg/m2   Outpatient Encounter Prescriptions as of  05/23/2014  Medication Sig  . amLODipine (NORVASC) 5 MG tablet Take 5 mg by mouth every morning.   Marland Kitchen aspirin 81 MG tablet Take 81 mg by mouth every morning.   Marland Kitchen buPROPion (WELLBUTRIN) 75 MG tablet Take 75 mg by mouth 2 (two) times daily.   . citalopram (CELEXA) 20 MG tablet Take 20 mg by mouth every morning.   . dorzolamide-timolol (COSOPT) 22.3-6.8 MG/ML ophthalmic solution Place 1 drop into both eyes 2 (two) times daily.  . fenofibrate (TRICOR) 145 MG tablet Take 145 mg by mouth every evening.   . furosemide (LASIX) 40 MG tablet Take 40 mg by mouth 2 (two) times daily.   Marland Kitchen gabapentin (NEURONTIN) 100 MG capsule Take 100 mg by mouth 2 (two) times daily.  . insulin aspart (NOVOLOG) 100 UNIT/ML injection Inject 8 Units into the skin 3 (three) times daily before meals. Give an additional 2 units prior to meals for cbg >200  . insulin glargine (LANTUS) 100 UNIT/ML injection Inject 28 Units into the skin at bedtime.   . isosorbide mononitrate (IMDUR) 60 MG 24 hr tablet Take 60 mg by mouth every morning.   . latanoprost (XALATAN) 0.005 % ophthalmic solution Place 1 drop into both eyes at bedtime.    . lidocaine (LIDODERM) 5 %  Place 1 patch onto the skin every 12 (twelve) hours. Remove & Discard patch within 12 hours to right neck  . LORazepam (ATIVAN) 0.5 MG tablet Take 0.5 mg by mouth every 6 (six) hours as needed for anxiety. Take 0.25mg   tablet by mouth once daily  . magnesium hydroxide (MILK OF MAGNESIA) 400 MG/5ML suspension Take by mouth daily as needed.    . Memantine HCl ER (NAMENDA XR) 14 MG CP24 Take 1 tablet by mouth every morning.  . metoprolol (LOPRESSOR) 50 MG tablet Take 50 mg by mouth 2 (two) times daily.  . Multiple Vitamins-Minerals (DECUBI-VITE) CAPS Take 1 capsule by mouth 2 (two) times daily.   Marland Kitchen. omeprazole (PRILOSEC) 20 MG capsule Take 20 mg by mouth every morning.   . ondansetron (ZOFRAN) 4 MG tablet Take 4 mg by mouth every 6 (six) hours as needed for nausea or vomiting.  Marland Kitchen.  oxycodone (OXY-IR) 5 MG capsule Take one tablet by mouth every 8 hours as needed for pain.  . OxyCODONE HCl ER (OXYCONTIN) 30 MG T12A Take one tablet by mouth every 12 hours for pain  . polyethylene glycol powder (GLYCOLAX/MIRALAX) powder Take 17 g by mouth 2 (two) times daily.   . potassium chloride (K-DUR,KLOR-CON) 10 MEQ tablet Take 10 mEq by mouth every morning.   . senna (SENOKOT) 8.6 MG tablet Take 2 tablets by mouth 2 (two) times daily.      SIGNIFICANT DIAGNOSTIC EXAMS  03-14-14: right ankle tib/fib mri: 1. 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 2. T2 hyperintense probable draining abscess deep to ulceration. 3. Ankle effusion. This is probably reactive. Septic arthritis is less likely. 4. Infectious tenosynovitis of the peroneal tendons. 5. No abscess in the proximal RIGHT leg.    LABS REVIEWED:   08-03-13: wbc 6. 5; hgb 10.6; hct 30.2; mcv 92.6; plt 96; glucose 185; bun 46 creat 2.57; k+ 3.8; na++142; liver normal albumin 3.9 pth 42.6; ca++ 9.5; EPO 31.9; vit d 27 09-01-13: glucose 224; bun 48; creat 2.38; k+ 3.7; na++140; hgb a1c 6.5  11-15-13: urine micro-albumin 17.73 12-03-13: glucose 174; bun 27; creat 1.71; k+3.8; na++140 hgb a1c 6.9 02-17-14: right ankle wound culture: mrsa: zyvox  02-28-14: wbc 8.8; hgb 8.5; hct 25.5; mcv 87.1; plt 158      ROS Constitutional: Negative for malaise/fatigue.  Respiratory: Negative for cough and shortness of breath.   Cardiovascular: Negative for chest pain, palpitations and leg swelling.  Gastrointestinal: Negative for heartburn, abdominal pain and constipation.  Musculoskeletal: Negative for joint pain.  Skin:       Chronic ulcer right foot   Psychiatric/Behavioral: Negative for depression. The patient is not nervous/anxious.    Physical Exam Constitutional: No distress. Bilateral eyes are swollen with the right worse than the left both with green drainage present.  Overweight    Neck: Neck supple. No JVD present.  Cardiovascular: Normal rate and regular rhythm.   Pedal pulses very faint   Respiratory: Effort normal and breath sounds normal. No respiratory distress. He has no wheezes.  GI: Soft. Bowel sounds are normal. He exhibits no distension. There is no tenderness.  Musculoskeletal: He exhibits no edema.  Is able to move all extremities   Neurological: He is alert.  Skin: Skin is warm and dry. He is not diaphoretic.  Right outer ankle ulcer followed by wound care     ASSESSMENT/ PLAN:  1. bilateral conjunctivitis: will begin tobramycin eye drops to both eyes for 10 days four  times daily     Synthia Innocent NP [facility]  Contact [phone] Monday through Friday 8am- 5pm  After hours call [phone]

## 2014-05-30 ENCOUNTER — Encounter (HOSPITAL_BASED_OUTPATIENT_CLINIC_OR_DEPARTMENT_OTHER): Payer: BLUE CROSS/BLUE SHIELD

## 2014-05-30 ENCOUNTER — Non-Acute Institutional Stay (SKILLED_NURSING_FACILITY): Payer: Medicare Other | Admitting: Adult Health

## 2014-05-30 DIAGNOSIS — I119 Hypertensive heart disease without heart failure: Secondary | ICD-10-CM | POA: Diagnosis not present

## 2014-05-30 DIAGNOSIS — I83019 Varicose veins of right lower extremity with ulcer of unspecified site: Secondary | ICD-10-CM

## 2014-05-30 DIAGNOSIS — I5032 Chronic diastolic (congestive) heart failure: Secondary | ICD-10-CM

## 2014-05-30 DIAGNOSIS — D649 Anemia, unspecified: Secondary | ICD-10-CM | POA: Diagnosis not present

## 2014-05-30 DIAGNOSIS — G8929 Other chronic pain: Secondary | ICD-10-CM | POA: Diagnosis not present

## 2014-05-30 DIAGNOSIS — K219 Gastro-esophageal reflux disease without esophagitis: Secondary | ICD-10-CM | POA: Diagnosis not present

## 2014-05-30 DIAGNOSIS — H409 Unspecified glaucoma: Secondary | ICD-10-CM | POA: Diagnosis not present

## 2014-05-30 DIAGNOSIS — I83011 Varicose veins of right lower extremity with ulcer of thigh: Secondary | ICD-10-CM

## 2014-05-30 DIAGNOSIS — F0150 Vascular dementia without behavioral disturbance: Secondary | ICD-10-CM

## 2014-05-30 DIAGNOSIS — I83013 Varicose veins of right lower extremity with ulcer of ankle: Secondary | ICD-10-CM

## 2014-05-30 DIAGNOSIS — I83014 Varicose veins of right lower extremity with ulcer of heel and midfoot: Secondary | ICD-10-CM

## 2014-05-30 DIAGNOSIS — E1149 Type 2 diabetes mellitus with other diabetic neurological complication: Secondary | ICD-10-CM

## 2014-05-30 DIAGNOSIS — T50905A Adverse effect of unspecified drugs, medicaments and biological substances, initial encounter: Secondary | ICD-10-CM

## 2014-05-30 DIAGNOSIS — E785 Hyperlipidemia, unspecified: Secondary | ICD-10-CM

## 2014-05-30 DIAGNOSIS — I83018 Varicose veins of right lower extremity with ulcer other part of lower leg: Secondary | ICD-10-CM

## 2014-05-30 DIAGNOSIS — E1140 Type 2 diabetes mellitus with diabetic neuropathy, unspecified: Secondary | ICD-10-CM | POA: Diagnosis not present

## 2014-05-30 DIAGNOSIS — I83012 Varicose veins of right lower extremity with ulcer of calf: Secondary | ICD-10-CM

## 2014-05-30 DIAGNOSIS — I10 Essential (primary) hypertension: Secondary | ICD-10-CM | POA: Diagnosis not present

## 2014-05-30 DIAGNOSIS — K5909 Other constipation: Secondary | ICD-10-CM | POA: Diagnosis not present

## 2014-05-30 DIAGNOSIS — L97919 Non-pressure chronic ulcer of unspecified part of right lower leg with unspecified severity: Secondary | ICD-10-CM

## 2014-05-30 DIAGNOSIS — F418 Other specified anxiety disorders: Secondary | ICD-10-CM

## 2014-05-30 DIAGNOSIS — K5903 Drug induced constipation: Secondary | ICD-10-CM

## 2014-05-30 DIAGNOSIS — I83015 Varicose veins of right lower extremity with ulcer other part of foot: Secondary | ICD-10-CM

## 2014-06-03 DIAGNOSIS — L03115 Cellulitis of right lower limb: Secondary | ICD-10-CM | POA: Insufficient documentation

## 2014-06-03 DIAGNOSIS — L89512 Pressure ulcer of right ankle, stage 2: Secondary | ICD-10-CM | POA: Insufficient documentation

## 2014-06-03 DIAGNOSIS — Z79899 Other long term (current) drug therapy: Secondary | ICD-10-CM | POA: Insufficient documentation

## 2014-06-06 ENCOUNTER — Encounter (HOSPITAL_BASED_OUTPATIENT_CLINIC_OR_DEPARTMENT_OTHER): Payer: Medicare Other | Attending: Plastic Surgery

## 2014-06-06 DIAGNOSIS — I10 Essential (primary) hypertension: Secondary | ICD-10-CM | POA: Diagnosis not present

## 2014-06-06 DIAGNOSIS — L97319 Non-pressure chronic ulcer of right ankle with unspecified severity: Secondary | ICD-10-CM | POA: Insufficient documentation

## 2014-06-06 LAB — BASIC METABOLIC PANEL: CREATININE: 1.6 mg/dL — AB (ref 0.6–1.3)

## 2014-06-06 LAB — HEMOGLOBIN A1C: HEMOGLOBIN A1C: 6.4 % — AB (ref 4.0–6.0)

## 2014-06-09 ENCOUNTER — Non-Acute Institutional Stay (SKILLED_NURSING_FACILITY): Payer: Medicare Other | Admitting: Adult Health

## 2014-06-09 DIAGNOSIS — W19XXXA Unspecified fall, initial encounter: Secondary | ICD-10-CM

## 2014-06-09 DIAGNOSIS — Y92129 Unspecified place in nursing home as the place of occurrence of the external cause: Secondary | ICD-10-CM

## 2014-06-09 DIAGNOSIS — M150 Primary generalized (osteo)arthritis: Secondary | ICD-10-CM

## 2014-06-09 DIAGNOSIS — M159 Polyosteoarthritis, unspecified: Secondary | ICD-10-CM

## 2014-07-05 ENCOUNTER — Encounter: Payer: Self-pay | Admitting: Internal Medicine

## 2014-07-05 ENCOUNTER — Non-Acute Institutional Stay (SKILLED_NURSING_FACILITY): Payer: Medicare Other | Admitting: Internal Medicine

## 2014-07-05 DIAGNOSIS — I10 Essential (primary) hypertension: Secondary | ICD-10-CM

## 2014-07-05 DIAGNOSIS — G8929 Other chronic pain: Secondary | ICD-10-CM

## 2014-07-05 DIAGNOSIS — I5032 Chronic diastolic (congestive) heart failure: Secondary | ICD-10-CM | POA: Diagnosis not present

## 2014-07-05 DIAGNOSIS — K5909 Other constipation: Secondary | ICD-10-CM | POA: Diagnosis not present

## 2014-07-05 DIAGNOSIS — F418 Other specified anxiety disorders: Secondary | ICD-10-CM

## 2014-07-05 DIAGNOSIS — G629 Polyneuropathy, unspecified: Secondary | ICD-10-CM | POA: Diagnosis not present

## 2014-07-05 DIAGNOSIS — K5903 Drug induced constipation: Secondary | ICD-10-CM

## 2014-07-05 DIAGNOSIS — M150 Primary generalized (osteo)arthritis: Secondary | ICD-10-CM

## 2014-07-05 DIAGNOSIS — M159 Polyosteoarthritis, unspecified: Secondary | ICD-10-CM

## 2014-07-05 DIAGNOSIS — F0150 Vascular dementia without behavioral disturbance: Secondary | ICD-10-CM

## 2014-07-05 DIAGNOSIS — T50905A Adverse effect of unspecified drugs, medicaments and biological substances, initial encounter: Secondary | ICD-10-CM | POA: Diagnosis not present

## 2014-07-05 DIAGNOSIS — E1142 Type 2 diabetes mellitus with diabetic polyneuropathy: Secondary | ICD-10-CM

## 2014-07-05 NOTE — Progress Notes (Signed)
[name], male   DOB: 1929/08/09, 81 y.o.   MRN: [id number]    DATE: 07/05/14  Location:  [facility]    Place of Service: SNF (31)   Extended Emergency Contact Information Primary Emergency Contact: Eplin,Lestie C Address: [address]          [place], [place] [address] [place] Home Phone: [phone] Relation: Spouse  Advanced Directive information  DNR  Chief Complaint  Patient presents with  . Medical Management of Chronic Issues    HPI:  81 yo male long term resident seen today for f/u. He c/o generalized pain today with numbness/tingling but overall improved. No recent falls. No nursing issues. Appetite okay and he is sleeping well.  Mood stable on ativan and wellbutrin.  He takes namenda xr for dementia  DM controlled on lantus, SSI. CBG 277 today but usually 200-400s. No low BS reactions.   BP/heart stable on imdur, klor con, lisinopril, lopressor, amlodipine and lasix. SBP usually 130-140s. He takes tricor for cholesterol  For pain, he takes gabapentin, oxycodone, Oxy IR, voltaren gel, lidoderm patch  GERD/constipation controlled with omeprazole, MOM, miralax, and zofran  He takes vitamin and minerals daily  Past Medical History  Diagnosis Date  . 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 Hypertension   . Peripheral vascular disease   . Depression   . Anxiety   . GERD (gastroesophageal reflux disease)   . Renal insufficiency   . Dyslipidemia   . DDD (degenerative disc disease), lumbosacral   . History of colon polyps   . History of MRSA infection     Hx chronic diabetic ulcer's secondary MRSA  . Ulcer of right ankle   . Insulin dependent type 2 diabetes mellitus   . History of diabetic ulcer of foot   . Diabetic peripheral neuropathy   . Wheelchair dependent   . At high risk for falls   . Chronic diastolic congestive heart failure 06/13/2012    Echo in 2011 with grade 1 diastolic dysfunction and  normal systolic function with EF 60%.       Past Surgical History  Procedure Laterality Date  . Laparoscopic cholecystectomy    . Inguinal hernia repair Left 11-30-2001  . Colonoscopy with esophagogastroduodenoscopy (egd)  09-21-2002  . Transthoracic echocardiogram  12-20-2009    severe LVH/  ef 60%/  grade I diastolic dysfunction/  AV sclerosis without stenosis/  mild LAE and RAE/    . Toe amputation    . Incision and drainage of wound Right 01/10/2014    Procedure: IRRIGATION AND DEBRIDEMENT RIGHT ANKLE WOUND WITH PLACEMENT OF ACELL;  Surgeon: Wayland Denis, DO;  Location: [facility];  Service: Plastics;  Laterality: Right;    Patient Care Team: Kirt Boys, DO as PCP - General (Internal Medicine) Sharee Holster, NP as Nurse Practitioner (Nurse Practitioner)  History   Social History  . Marital Status: Married    Spouse Name: N/A  . Number of Children: N/A  . Years of Education: N/A   Occupational History  .      Retired   Social History Main Topics  . Smoking status: Former Games developer  . Smokeless tobacco: Not on file  . Alcohol Use: No     Comment: hx alcohol abuse  in remission  . Drug Use: Not on file  . Sexual Activity: Not on file   Other Topics Concern  . Not on file   Social History  Narrative   Lives at [facility]     reports that he has quit smoking. He does not have any smokeless tobacco history on file. He reports that he does not drink alcohol. His drug history is not on file.  Immunization History  Administered Date(s) Administered  . Influenza Split 12/06/2011  . Influenza Whole 12/24/2012  . Influenza-Unspecified 12/15/2013  . PPD Test 03/29/2009  . Pneumococcal Conjugate-13 08/21/2005    No Known Allergies  Medications: Patient's Medications  New Prescriptions   No medications on file  Previous Medications   AMLODIPINE (NORVASC) 5 MG TABLET    Take 5 mg by mouth every morning.    ASPIRIN 81 MG TABLET    Take 81  mg by mouth every morning.    BUPROPION (WELLBUTRIN) 75 MG TABLET    Take 75 mg by mouth 2 (two) times daily.    CITALOPRAM (CELEXA) 20 MG TABLET    Take 20 mg by mouth every morning.    DORZOLAMIDE-TIMOLOL (COSOPT) 22.3-6.8 MG/ML OPHTHALMIC SOLUTION    Place 1 drop into both eyes 2 (two) times daily.   FENOFIBRATE (TRICOR) 145 MG TABLET    Take 145 mg by mouth every evening.    FUROSEMIDE (LASIX) 40 MG TABLET    Take 40 mg by mouth 2 (two) times daily.    GABAPENTIN (NEURONTIN) 100 MG CAPSULE    Take 100 mg by mouth 2 (two) times daily.   INSULIN ASPART (NOVOLOG) 100 UNIT/ML INJECTION    Inject 8 Units into the skin 3 (three) times daily before meals. Give an additional 2 units prior to meals for cbg >200   INSULIN GLARGINE (LANTUS) 100 UNIT/ML INJECTION    Inject 28 Units into the skin at bedtime.    ISOSORBIDE MONONITRATE (IMDUR) 60 MG 24 HR TABLET    Take 60 mg by mouth every morning.    LATANOPROST (XALATAN) 0.005 % OPHTHALMIC SOLUTION    Place 1 drop into both eyes at bedtime.     LIDOCAINE (LIDODERM) 5 %    Place 1 patch onto the skin every 12 (twelve) hours. Remove & Discard patch within 12 hours to right neck   LORAZEPAM (ATIVAN) 0.5 MG TABLET    Take 0.5 mg by mouth every 6 (six) hours as needed for anxiety. Take 0.25mg   tablet by mouth once daily   MAGNESIUM HYDROXIDE (MILK OF MAGNESIA) 400 MG/5ML SUSPENSION    Take by mouth daily as needed.     MEMANTINE HCL ER (NAMENDA XR) 14 MG CP24    Take 1 tablet by mouth every morning.   METOPROLOL (LOPRESSOR) 50 MG TABLET    Take 50 mg by mouth 2 (two) times daily.   MULTIPLE VITAMINS-MINERALS (DECUBI-VITE) CAPS    Take 1 capsule by mouth 2 (two) times daily.    OMEPRAZOLE (PRILOSEC) 20 MG CAPSULE    Take 20 mg by mouth every morning.    ONDANSETRON (ZOFRAN) 4 MG TABLET    Take 4 mg by mouth every 6 (six) hours as needed for nausea or vomiting.   OXYCODONE (OXY-IR) 5 MG CAPSULE    Take one tablet by mouth every 8 hours as needed for pain.    OXYCODONE HCL ER (OXYCONTIN) 30 MG T12A    Take one tablet by mouth every 12 hours for pain   POLYETHYLENE GLYCOL POWDER (GLYCOLAX/MIRALAX) POWDER    Take 17 g by mouth 2 (two) times daily.    POTASSIUM CHLORIDE (K-DUR,KLOR-CON) 10 MEQ TABLET  Take 10 mEq by mouth every morning.    SENNA (SENOKOT) 8.6 MG TABLET    Take 2 tablets by mouth 2 (two) times daily.   Modified Medications   No medications on file  Discontinued Medications   No medications on file    Review of Systems  Unable to perform ROS: Dementia    Filed Vitals:   07/05/14 1645  BP: 153/71  Pulse: 62  Temp: 97.2 F (36.2 C)  Weight: 238 lb (107.956 kg)   Body mass index is 34.15 kg/(m^2).  Physical Exam  Constitutional: He appears well-developed and well-nourished.  Sitting in w/c  HENT:  Mouth/Throat: Oropharynx is clear and moist.  Eyes: Pupils are equal, round, and reactive to light. No scleral icterus.  Neck: Neck supple. Carotid bruit is not present. No thyromegaly present.  Cardiovascular: Normal rate, regular rhythm and intact distal pulses.  Exam reveals no gallop and no friction rub.   Murmur (1/6 SEM) heard. Trace distal LE swelling. No calf TTP  Pulmonary/Chest: Effort normal and breath sounds normal. He has no wheezes. He has no rales. He exhibits no tenderness.  Abdominal: Soft. Bowel sounds are normal. He exhibits no distension, no abdominal bruit, no pulsatile midline mass and no mass. There is no tenderness. There is no rebound and no guarding.  Lymphadenopathy:    He has no cervical adenopathy.  Neurological: He is alert.  Skin: Skin is warm and dry. No rash noted. There is erythema (RLE).  Psychiatric: He has a normal mood and affect. His behavior is normal.     Labs reviewed: Recent Results (from the past 2160 hour(s))  Basic metabolic panel     Status: Abnormal   Collection Time: 06/06/14 12:00 AM  Result Value Ref Range   Creatinine 1.6 (A) 0.6 - 1.3 mg/dL  Hemoglobin [id number]      Status: Abnormal   Collection Time: 06/06/14 12:00 AM  Result Value Ref Range   Hgb A1c MFr Bld 6.4 (A) 4.0 - 6.0 %   Orders Only on 03/14/2014  Component Date Value Ref Range Status  . Sodium 03/14/2014 141  135 - 145 mEq/L Final  . Potassium 03/14/2014 3.9  3.5 - 5.3 mEq/L Final  . Chloride 03/14/2014 102  96 - 112 mEq/L Final  . CO2 03/14/2014 28  19 - 32 mEq/L Final  . Glucose, Bld 03/14/2014 81  70 - 99 mg/dL Final  . BUN 09/60/454001/01/2015 25* 6 - 23 mg/dL Final  . Creat 98/11/914701/01/2015 1.75* 0.50 - 1.35 mg/dL Final  . Calcium 82/95/621301/01/2015 9.3  8.4 - 10.5 mg/dL Final  . Hgb Y8MA1c MFr Bld 03/14/2014 7.1* <5.7 % Final   Comment:                                                                        According to the ADA Clinical Practice Recommendations for 2011, when HbA1c is used as a screening test:     >=6.5%   Diagnostic of Diabetes Mellitus            (if abnormal result is confirmed)   5.7-6.4%   Increased risk of developing Diabetes Mellitus   References:Diagnosis and Classification of Diabetes Mellitus,Diabetes Care,2011,34(Suppl 1):S62-S69 and Standards of Medical Care  in         Diabetes - 2011,Diabetes Care,2011,34 (Suppl 1):S11-S61.     . Mean Plasma Glucose 03/14/2014 157* <117 mg/dL Final    No results found.   Assessment/Plan   ICD-9-CM ICD-10-CM   1. Chronic pain - stable 338.29 G89.29   2. Diabetic peripheral neuropathy associated with type 2 diabetes mellitus - controlled DM 250.60 E11.42    357.2 G62.9   3. Primary osteoarthritis involving multiple joints - stable 715.09 M15.0   4. Essential hypertension - stable 401.9 I10   5. Depression with anxiety - stable 300.4 F41.8   6. Vascular dementia, without behavioral disturbance - stable 290.40 F01.50   7. Chronic diastolic congestive heart failure - stable 428.32 I50.32    428.0    8. Constipation due to pain medication - stable 564.09 K59.09    E947.9 T50.905A     --cont current pain regimen  --wound care to  follow prn  --cont nutritional supplement BID  --Pt is medically stable on current tx plan. Continue current medications as ordered. PT/OT/ST as indicated. Will follow  Myrla Malanowski S. Ancil Linsey  [facility] [address] [place], [place] [address] [phone] Cell (Monday-Friday 8 AM - 5 PM) [phone] After 5 PM and follow prompts

## 2014-07-30 DIAGNOSIS — L03115 Cellulitis of right lower limb: Secondary | ICD-10-CM | POA: Insufficient documentation

## 2014-07-30 NOTE — Progress Notes (Signed)
[name], male   DOB: 02-Jan-1930, 81 y.o.   MRN: [id number]  [name] living [place]     No Known Allergies     Chief Complaint  Patient presents with  . Acute Visit    right leg cellulitis     HPI:  Nursing staff is concerned with his right lower leg. The leg is inflamed; he has chronic ulceration on his right lower leg; which is not inflamed. There are no reports of fevers present. He is not complaining of any pain present. With his history he will need aggressive abt.   Past Medical History  Diagnosis Date  . 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 Hypertension   . Peripheral vascular disease   . Depression   . Anxiety   . GERD (gastroesophageal reflux disease)   . Renal insufficiency   . Dyslipidemia   . DDD (degenerative disc disease), lumbosacral   . History of colon polyps   . History of MRSA infection     Hx chronic diabetic ulcer's secondary MRSA  . Ulcer of right ankle   . Insulin dependent type 2 diabetes mellitus   . History of diabetic ulcer of foot   . Diabetic peripheral neuropathy   . Wheelchair dependent   . At high risk for falls   . Chronic diastolic congestive heart failure 06/13/2012    Echo in 2011 with grade 1 diastolic dysfunction and normal systolic function with EF 60%.       Past Surgical History  Procedure Laterality Date  . Laparoscopic cholecystectomy    . Inguinal hernia repair Left 11-30-2001  . Colonoscopy with esophagogastroduodenoscopy (egd)  09-21-2002  . Transthoracic echocardiogram  12-20-2009    severe LVH/  ef 60%/  grade I diastolic dysfunction/  AV sclerosis without stenosis/  mild LAE and RAE/    . Toe amputation    . Incision and drainage of wound Right 01/10/2014    Procedure: IRRIGATION AND DEBRIDEMENT RIGHT ANKLE WOUND WITH PLACEMENT OF ACELL;  Surgeon: Wayland Denislaire [name], DO;  Location: [facility];  Service: Plastics;  Laterality: Right;    VITAL SIGNS BP 134/58 mmHg  Pulse 60  Ht 5'  10" (1.778 m)  Wt 238 lb (107.956 kg)  BMI 34.15 kg/m2   Outpatient Encounter Prescriptions as of 05/13/2014  Medication Sig   . amLODipine (NORVASC) 5 MG tablet Take 5 mg by mouth every morning.   Marland Kitchen. aspirin 81 MG tablet Take 81 mg by mouth every morning.   Marland Kitchen. buPROPion (WELLBUTRIN) 75 MG tablet Take 75 mg by mouth 2 (two) times daily.   . citalopram (CELEXA) 20 MG tablet Take 20 mg by mouth every morning.   . dorzolamide-timolol (COSOPT) 22.3-6.8 MG/ML ophthalmic solution Place 1 drop into both eyes 2 (two) times daily.  . fenofibrate (TRICOR) 145 MG tablet Take 145 mg by mouth every evening.   . furosemide (LASIX) 40 MG tablet Take 40 mg by mouth 2 (two) times daily.   Marland Kitchen. gabapentin (NEURONTIN) 100 MG capsule Take 100 mg by mouth 2 (two) times daily.  . insulin aspart (NOVOLOG) 100 UNIT/ML injection Inject 8 Units into the skin 3 (three) times daily before meals. Give an additional 2 units prior to meals for cbg >200  . insulin glargine (LANTUS) 100 UNIT/ML injection Inject 28 Units into the skin at bedtime.   . isosorbide mononitrate (IMDUR) 60 MG 24 hr tablet Take 60 mg by mouth every morning.   . latanoprost (XALATAN)  0.005 % ophthalmic solution Place 1 drop into both eyes at bedtime.    . lidocaine (LIDODERM) 5 % Place 1 patch onto the skin every 12 (twelve) hours. Remove & Discard patch within 12 hours to right neck  . LORazepam (ATIVAN) 0.5 MG tablet Take 0.5 mg by mouth every 6 (six) hours as needed for anxiety. Take 0.25mg   tablet by mouth once daily  . magnesium hydroxide (MILK OF MAGNESIA) 400 MG/5ML suspension Take by mouth daily as needed.    . Memantine HCl ER (NAMENDA XR) 14 MG CP24 Take 1 tablet by mouth every morning.  . metoprolol (LOPRESSOR) 50 MG tablet Take 50 mg by mouth 2 (two) times daily.  . Multiple Vitamins-Minerals (DECUBI-VITE) CAPS Take 1 capsule by mouth 2 (two) times daily.   Marland Kitchen omeprazole (PRILOSEC) 20 MG capsule Take 20 mg by mouth every morning.   .  ondansetron (ZOFRAN) 4 MG tablet Take 4 mg by mouth every 6 (six) hours as needed for nausea or vomiting.  Marland Kitchen oxycodone (OXY-IR) 5 MG capsule Take one tablet by mouth every 8 hours as needed for pain.  . OxyCODONE HCl ER (OXYCONTIN) 30 MG T12A Take one tablet by mouth every 12 hours for pain  . polyethylene glycol powder (GLYCOLAX/MIRALAX) powder Take 17 g by mouth 2 (two) times daily.   . potassium chloride (K-DUR,KLOR-CON) 10 MEQ tablet Take 10 mEq by mouth every morning.   . senna (SENOKOT) 8.6 MG tablet Take 2 tablets by mouth 2 (two) times daily.        SIGNIFICANT DIAGNOSTIC EXAMS   03-14-14: right ankle tib/fib mri: 1. 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 2. T2 hyperintense probable draining abscess deep to ulceration. 3. Ankle effusion. This is probably reactive. Septic arthritis is less likely. 4. Infectious tenosynovitis of the peroneal tendons. 5. No abscess in the proximal RIGHT leg.    LABS REVIEWED:   08-03-13: wbc 6. 5; hgb 10.6; hct 30.2; mcv 92.6; plt 96; glucose 185; bun 46 creat 2.57; k+ 3.8; na++142; liver normal albumin 3.9 pth 42.6; ca++ 9.5; EPO 31.9; vit d 27 09-01-13: glucose 224; bun 48; creat 2.38; k+ 3.7; na++140; hgb a1c 6.5  11-15-13: urine micro-albumin 17.73 12-03-13: glucose 174; bun 27; creat 1.71; k+3.8; na++140 hgb a1c 6.9 02-17-14: right ankle wound culture: mrsa: zyvox  02-28-14: wbc 8.8; hgb 8.5; hct 25.5; mcv 87.1; plt 158 05-02-14: glucose 194; bun 29; creat 1.81; k+3.6; na++138        ROS Constitutional: Negative for malaise/fatigue.  Respiratory: Negative for cough and shortness of breath.   Cardiovascular: Negative for chest pain, palpitations and leg swelling.  Gastrointestinal: Negative for heartburn, abdominal pain and constipation.  Musculoskeletal: Negative for joint pain.  Skin:       Chronic ulcer right foot   Psychiatric/Behavioral: Negative for depression. The patient is not  nervous/anxious.     Physical Exam Constitutional: No distress.  Overweight   Neck: Neck supple. No JVD present.  Cardiovascular: Normal rate and regular rhythm.   Pedal pulses very faint   Respiratory: Effort normal and breath sounds normal. No respiratory distress. He has no wheezes.  GI: Soft. Bowel sounds are normal. He exhibits no distension. There is no tenderness.  Musculoskeletal: He exhibits no edema.  Is able to move all extremities   Neurological: He is alert.  Skin: Skin is warm and dry. He is not diaphoretic.  Right outer ankle ulcer followed by wound care Right lower leg  inflamed the wound bed is not inflamed      ASSESSMENT/ PLAN:  1. Right lower leg cellulitis: will being cipro 500 mg twice daily for 2 weeks with zyvox 600 mg twice daily for 2 weeks and florstor twice daily for 2 weeks and will monitor his status     Synthia Innocent NP [facility]  Contact [phone] Monday through Friday 8am- 5pm  After hours call [phone]

## 2014-07-31 ENCOUNTER — Encounter: Payer: Self-pay | Admitting: Adult Health

## 2014-07-31 NOTE — Progress Notes (Signed)
[name], male   DOB: 1929-09-25, 81 y.o.   MRN: [id number]  [name] living [facility]     No Known Allergies     Chief Complaint  Patient presents with  . Annual Exam    HPI:  He is a long term resident of this facility being seen for his annual exam. Overall there has been little change in his status over the past several months. He has not been hospitalized. He tells me that he is feeling good and is not voicing any complaints or concerns. There are no nursing concerns today.    Past Medical History  Diagnosis Date  . 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 Hypertension   . Peripheral vascular disease   . Depression   . Anxiety   . GERD (gastroesophageal reflux disease)   . Renal insufficiency   . Dyslipidemia   . DDD (degenerative disc disease), lumbosacral   . History of colon polyps   . History of MRSA infection     Hx chronic diabetic ulcer's secondary MRSA  . Ulcer of right ankle   . Insulin dependent type 2 diabetes mellitus   . History of diabetic ulcer of foot   . Diabetic peripheral neuropathy   . Wheelchair dependent   . At high risk for falls   . Chronic diastolic congestive heart failure 06/13/2012    Echo in 2011 with grade 1 diastolic dysfunction and normal systolic function with EF 60%.       Past Surgical History  Procedure Laterality Date  . Laparoscopic cholecystectomy    . Inguinal hernia repair Left 11-30-2001  . Colonoscopy with esophagogastroduodenoscopy (egd)  09-21-2002  . Transthoracic echocardiogram  12-20-2009    severe LVH/  ef 60%/  grade I diastolic dysfunction/  AV sclerosis without stenosis/  mild LAE and RAE/    . Toe amputation    . Incision and drainage of wound Right 01/10/2014    Procedure: IRRIGATION AND DEBRIDEMENT RIGHT ANKLE WOUND WITH PLACEMENT OF ACELL;  Surgeon: Wayland Denis, DO;  Location: [facility];  Service: Plastics;  Laterality: Right;    Family History  Problem Relation Age  of Onset  . Diabetes Neg Hx     No DM in immediate family   History   Social History  . Marital Status: Married    Spouse Name: N/A  . Number of Children: N/A  . Years of Education: N/A   Occupational History  .      Retired   Social History Main Topics  . Smoking status: Former Games developer  . Smokeless tobacco: Not on file  . Alcohol Use: No     Comment: hx alcohol abuse  in remission  . Drug Use: Not on file  . Sexual Activity: Not on file   Other Topics Concern  . Not on file   Social History Narrative   Lives at [facility]     VITAL SIGNS BP 148/68 mmHg  Pulse 62  Ht  (1.778 m)  Wt 238 lb (107.956 kg)  BMI 34.15 kg/m2   Outpatient Encounter Prescriptions as of 05/30/2014  Medication Sig  . amLODipine (NORVASC) 5 MG tablet Take 5 mg by mouth every morning.   Marland Kitchen aspirin 81 MG tablet Take 81 mg by mouth every morning.   Marland Kitchen buPROPion (WELLBUTRIN) 75 MG tablet Take 75 mg by mouth 2 (two) times daily.   . dorzolamide-timolol (COSOPT) 22.3-6.8 MG/ML ophthalmic solution Place 1 drop into  both eyes 2 (two) times daily.  . fenofibrate (TRICOR) 145 MG tablet Take 145 mg by mouth every evening.   . furosemide (LASIX) 40 MG tablet Take 40 mg by mouth 2 (two) times daily.   Marland Kitchen. gabapentin (NEURONTIN) 100 MG capsule Take 100 mg by mouth 2 (two) times daily.  . insulin aspart (NOVOLOG) 100 UNIT/ML injection Inject 8 Units into the skin 3 (three) times daily before meals. Give an additional 2 units prior to meals for cbg >200  . insulin glargine (LANTUS) 100 UNIT/ML injection Inject 28 Units into the skin at bedtime.   . isosorbide mononitrate (IMDUR) 60 MG 24 hr tablet Take 60 mg by mouth every morning.   . latanoprost (XALATAN) 0.005 % ophthalmic solution Place 1 drop into both eyes at bedtime.    . lidocaine (LIDODERM) 5 % Place 1 patch onto the skin every 12 (twelve) hours. Remove & Discard patch within 12 hours to right neck  . LORazepam (ATIVAN) 0.5 MG tablet Take  0.5 mg by mouth every 6 (six) hours as needed for anxiety. Take 0.25mg   tablet by mouth once daily  . magnesium hydroxide (MILK OF MAGNESIA) 400 MG/5ML suspension Take by mouth daily as needed.    . Memantine HCl ER (NAMENDA XR) 14 MG CP24 Take 1 tablet by mouth every morning.  . metoprolol (LOPRESSOR) 50 MG tablet Take 50 mg by mouth 2 (two) times daily.  . Multiple Vitamins-Minerals (DECUBI-VITE) CAPS Take 1 capsule by mouth 2 (two) times daily.   Marland Kitchen. omeprazole (PRILOSEC) 20 MG capsule Take 20 mg by mouth every morning.   . ondansetron (ZOFRAN) 4 MG tablet Take 4 mg by mouth every 6 (six) hours as needed for nausea or vomiting.  Marland Kitchen. oxycodone (OXY-IR) 5 MG capsule Take one tablet by mouth every 8 hours as needed for pain.  . OxyCODONE HCl ER (OXYCONTIN) 30 MG T12A Take one tablet by mouth every 12 hours for pain  . polyethylene glycol powder (GLYCOLAX/MIRALAX) powder Take 17 g by mouth 2 (two) times daily.   . potassium chloride (K-DUR,KLOR-CON) 10 MEQ tablet Take 10 mEq by mouth every morning.   . senna (SENOKOT) 8.6 MG tablet Take 2 tablets by mouth 2 (two) times daily.       SIGNIFICANT DIAGNOSTIC EXAMS   03-14-14: right ankle tib/fib mri: 1. 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 2. T2 hyperintense probable draining abscess deep to ulceration. 3. Ankle effusion. This is probably reactive. Septic arthritis is less likely. 4. Infectious tenosynovitis of the peroneal tendons. 5. No abscess in the proximal RIGHT leg.    LABS REVIEWED:   08-03-13: wbc 6. 5; hgb 10.6; hct 30.2; mcv 92.6; plt 96; glucose 185; bun 46 creat 2.57; k+ 3.8; na++142; liver normal albumin 3.9 pth 42.6; ca++ 9.5; EPO 31.9; vit d 27 09-01-13: glucose 224; bun 48; creat 2.38; k+ 3.7; na++140; hgb a1c 6.5  11-15-13: urine micro-albumin 17.73 12-03-13: glucose 174; bun 27; creat 1.71; k+3.8; na++140 hgb a1c 6.9 02-17-14: right ankle wound culture: mrsa: zyvox    02-28-14: wbc 8.8; hgb 8.5; hct 25.5; mcv 87.1; plt 158  05-02-14: glucose 194; bun 29; creat 1.81; k+3.6; na++138      ROS Constitutional: Negative for malaise/fatigue.  Respiratory: Negative for cough and shortness of breath.   Cardiovascular: Negative for chest pain, palpitations and leg swelling.  Gastrointestinal: Negative for heartburn, abdominal pain and constipation.  Musculoskeletal: Negative for joint pain.  Skin:  Chronic ulcer right foot   Psychiatric/Behavioral: Negative for depression. The patient is not nervous/anxious.    Physical Exam Constitutional: No distress.  Overweight   Neck: Neck supple. No JVD present.  Cardiovascular: Normal rate and regular rhythm.   Pedal pulses very faint   Respiratory: Effort normal and breath sounds normal. No respiratory distress. He has no wheezes.  GI: Soft. Bowel sounds are normal. He exhibits no distension. There is no tenderness.  Musculoskeletal: He exhibits no edema.  Is able to move all extremities   Neurological: He is alert.  Skin: Skin is warm and dry. He is not diaphoretic.  Right outer ankle ulcer followed by wound care     ASSESSMENT/ PLAN:  1. Hypertension: will continue lopressor 50 mg twice daily  lisinopril 2.5 mg daily will increase nor vasc to 10 mg daily;   2. Chronic diastolic heart failure: is stable will continue lasix 40 mg twice daily with k+ 10 meq daily; imdur 60 mg daily and will monitor  3. Gerd: will continue prilosec 20 mg daily  4.  Constipation: will continue miralax twice daily; senna 2 tabs twice daily   5. Diabetes: will continue lantus 28 units daily and change the novolog to 3 units prior to lunch and supper with 8 units prior to meals for cbg >=150  His hgb a1c is 6.9   6. Depression with anxiety: is stable will continue  wellbutrin 75 mg twice daily and ativan 0.25 mg daily and 0.5 mg twice daily as needed   7. Vascular dementia: is without change; will continue namenda xr  14 mg daily and will monitor  8. Dyslipidemia: will continue tricor 145 mg daily   9. Chronic pain: his pain is presently being managed will continue oxycontin 30 mg twice daily; neurontin 100 mg twice daily; lidoderm patch to his right neck daily; and oxycodone 5 mg every 8 hours as needed   10. Glaucoma; will continue latanoprost to both eyes nightly and cosopt to both eyes twice daily   11. Stasis leg ulcer: goes to wound clinic to right outer ankle ulcer; will continue to monitor his status.   12. Anemia: his hgb is 8.5; will monitor    Will place him on the eye doctor and foot doctor list Will check cbc; cmp; lipids; hgb a1c    Synthia Innocent NP [facility]  Contact [phone] Monday through Friday 8am- 5pm  After hours call [phone]

## 2014-08-15 DIAGNOSIS — Y92129 Unspecified place in nursing home as the place of occurrence of the external cause: Secondary | ICD-10-CM

## 2014-08-15 DIAGNOSIS — W19XXXA Unspecified fall, initial encounter: Secondary | ICD-10-CM | POA: Insufficient documentation

## 2014-08-15 DIAGNOSIS — M1990 Unspecified osteoarthritis, unspecified site: Secondary | ICD-10-CM | POA: Insufficient documentation

## 2014-08-15 NOTE — Progress Notes (Signed)
[name], male   DOB: 14-Apr-1929, 81 y.o.   MRN: [id number]  [name] living [place]     No Known Allergies     Chief Complaint  Patient presents with  . Acute Visit    status post fall     HPI:  He has had a fall last pm with no apparent injury but did have left hip pain. The x-ray do not show any fracture present. He is also having right wrist pain and mild swelling. His x-ray of the right wrist was negative for fracture.   Past Medical History  Diagnosis Date  . 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 Hypertension   . Peripheral vascular disease   . Depression   . Anxiety   . GERD (gastroesophageal reflux disease)   . Renal insufficiency   . Dyslipidemia   . DDD (degenerative disc disease), lumbosacral   . History of colon polyps   . History of MRSA infection     Hx chronic diabetic ulcer's secondary MRSA  . Ulcer of right ankle   . Insulin dependent type 2 diabetes mellitus   . History of diabetic ulcer of foot   . Diabetic peripheral neuropathy   . Wheelchair dependent   . At high risk for falls   . Chronic diastolic congestive heart failure 06/13/2012    Echo in 2011 with grade 1 diastolic dysfunction and normal systolic function with EF 60%.     . Portal hypertension 01/15/2010    Qualifier: Diagnosis of  By: Everardo All MD, Sean A   . UNSPECIFIED PERIPHERAL VASCULAR DISEASE 01/15/2010    Qualifier: Diagnosis of  By: Everardo All MD, Cleophas Dunker     Past Surgical History  Procedure Laterality Date  . Laparoscopic cholecystectomy    . Inguinal hernia repair Left 11-30-2001  . Colonoscopy with esophagogastroduodenoscopy (egd)  09-21-2002  . Transthoracic echocardiogram  12-20-2009    severe LVH/  ef 60%/  grade I diastolic dysfunction/  AV sclerosis without stenosis/  mild LAE and RAE/    . Toe amputation    . Incision and drainage of wound Right 01/10/2014    Procedure: IRRIGATION AND DEBRIDEMENT RIGHT ANKLE WOUND WITH PLACEMENT OF ACELL;  Surgeon:  Wayland Denis, DO;  [facility];  Service: Plastics;  Laterality: Right;    VITAL SIGNS BP 132/79 mmHg  Pulse 68  Ht  (1.778 m)  Wt 238 lb (107.956 kg)  BMI 34.15 kg/m2   Outpatient Encounter Prescriptions as of 06/09/2014  Medication Sig  . amLODipine (NORVASC) 5 MG tablet Take 5 mg by mouth every morning.   Marland Kitchen aspirin 81 MG tablet Take 81 mg by mouth every morning.   Marland Kitchen buPROPion (WELLBUTRIN) 75 MG tablet Take 75 mg by mouth 2 (two) times daily.   . citalopram (CELEXA) 20 MG tablet Take 20 mg by mouth every morning.   . dorzolamide-timolol (COSOPT) 22.3-6.8 MG/ML ophthalmic solution Place 1 drop into both eyes 2 (two) times daily.  . fenofibrate (TRICOR) 145 MG tablet Take 145 mg by mouth every evening.   . furosemide (LASIX) 40 MG tablet Take 40 mg by mouth 2 (two) times daily.   Marland Kitchen gabapentin (NEURONTIN) 100 MG capsule Take 100 mg by mouth 2 (two) times daily.  . insulin aspart (NOVOLOG) 100 UNIT/ML injection Inject 8 Units into the skin 3 (three) times daily before meals for cbg >200  . insulin glargine (LANTUS) 100 UNIT/ML injection Inject 28 Units into the skin  at bedtime.   . isosorbide mononitrate (IMDUR) 60 MG 24 hr tablet Take 60 mg by mouth every morning.   . latanoprost (XALATAN) 0.005 % ophthalmic solution Place 1 drop into both eyes at bedtime.    . lidocaine (LIDODERM) 5 % Place 1 patch onto the skin every 12 (twelve) hours. Remove & Discard patch within 12 hours to right neck  . LORazepam (ATIVAN) 0.5 MG tablet Take 0.5 mg by mouth every 6 (six) hours as needed for anxiety. Take 0.25mg   tablet by mouth once daily  . magnesium hydroxide (MILK OF MAGNESIA) 400 MG/5ML suspension Take by mouth daily as needed.    . Memantine HCl ER (NAMENDA XR) 14 MG CP24 Take 1 tablet by mouth every morning.  . metoprolol (LOPRESSOR) 50 MG tablet Take 50 mg by mouth 2 (two) times daily.  . Multiple Vitamins-Minerals (DECUBI-VITE) CAPS Take 1 capsule by mouth 2  (two) times daily.   Marland Kitchen omeprazole (PRILOSEC) 20 MG capsule Take 20 mg by mouth every morning.   . ondansetron (ZOFRAN) 4 MG tablet Take 4 mg by mouth every 6 (six) hours as needed for nausea or vomiting.  Marland Kitchen oxycodone (OXY-IR) 5 MG capsule Take one tablet by mouth every 8 hours as needed for pain.  . OxyCODONE HCl ER (OXYCONTIN) 30 MG T12A Take one tablet by mouth every 12 hours for pain  . polyethylene glycol powder (GLYCOLAX/MIRALAX) powder Take 17 g by mouth 2 (two) times daily.   . potassium chloride (K-DUR,KLOR-CON) 10 MEQ tablet Take 10 mEq by mouth every morning.   . senna (SENOKOT) 8.6 MG tablet Take 2 tablets by mouth 2 (two) times daily.       SIGNIFICANT DIAGNOSTIC EXAMS   03-14-14: right ankle tib/fib mri: 1. 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 2. T2 hyperintense probable draining abscess deep to ulceration. 3. Ankle effusion. This is probably reactive. Septic arthritis is less likely. 4. Infectious tenosynovitis of the peroneal tendons. 5. No abscess in the proximal RIGHT leg.  06-08-14: right hand x-ray; mild osteoarthritis  06-08-14: right wrist x-ray; osteoarthritis right wrist  06-09-14: pelvic and left hip x-ray: mild degenerative disease left hip no significant change since 09-28-13.    LABS REVIEWED:   08-03-13: wbc 6. 5; hgb 10.6; hct 30.2; mcv 92.6; plt 96; glucose 185; bun 46 creat 2.57; k+ 3.8; na++142; liver normal albumin 3.9 pth 42.6; ca++ 9.5; EPO 31.9; vit d 27 09-01-13: glucose 224; bun 48; creat 2.38; k+ 3.7; na++140; hgb a1c 6.5  11-15-13: urine micro-albumin 17.73 12-03-13: glucose 174; bun 27; creat 1.71; k+3.8; na++140 hgb a1c 6.9 02-17-14: right ankle wound culture: mrsa: zyvox  02-28-14: wbc 8.8; hgb 8.5; hct 25.5; mcv 87.1; plt 158  05-02-14: glucose 194; bun 29; creat 1.81; k+3.6; na++138  06-06-14: glucose 140; bun 23; creat 1.63; k+3.5; na++140; hgb  a1c  6.4       ROS Constitutional: Negative  for malaise/fatigue.  Respiratory: Negative for cough and shortness of breath.   Cardiovascular: Negative for chest pain, palpitations and leg swelling.  Gastrointestinal: Negative for heartburn, abdominal pain and constipation.  Musculoskeletal: Negative for joint pain.  Skin:       Chronic ulcer right foot   Psychiatric/Behavioral: Negative for depression. The patient is not nervous/anxious.     Physical Exam Constitutional: No distress.  Overweight   Neck: Neck supple. No JVD present.  Cardiovascular: Normal rate and regular rhythm.   Pedal pulses very faint  Respiratory: Effort normal and breath sounds normal. No respiratory distress. He has no wheezes.  GI: Soft. Bowel sounds are normal. He exhibits no distension. There is no tenderness.  Musculoskeletal: He exhibits no edema.  Is able to move all extremities   Neurological: He is alert.  Skin: Skin is warm and dry. He is not diaphoretic.  Right outer ankle ulcer followed by wound care     ASSESSMENT/ PLAN:  1. Fall in nursing home 2. Osteoarthritis Will being lidoderm patch to the his left hip  Will being voltaren gel to his right wrist 2 gm three times daily  Will get a uric acid level.     Synthia Innocent NP [facility]  Contact [phone] Monday through Friday 8am- 5pm  After hours call [phone]

## 2014-08-16 ENCOUNTER — Non-Acute Institutional Stay (SKILLED_NURSING_FACILITY): Payer: Medicare Other | Admitting: Internal Medicine

## 2014-08-16 ENCOUNTER — Encounter: Payer: Self-pay | Admitting: Internal Medicine

## 2014-08-16 DIAGNOSIS — F418 Other specified anxiety disorders: Secondary | ICD-10-CM | POA: Diagnosis not present

## 2014-08-16 DIAGNOSIS — I10 Essential (primary) hypertension: Secondary | ICD-10-CM | POA: Diagnosis not present

## 2014-08-16 DIAGNOSIS — E1149 Type 2 diabetes mellitus with other diabetic neurological complication: Secondary | ICD-10-CM

## 2014-08-16 DIAGNOSIS — M150 Primary generalized (osteo)arthritis: Secondary | ICD-10-CM | POA: Diagnosis not present

## 2014-08-16 DIAGNOSIS — M159 Polyosteoarthritis, unspecified: Secondary | ICD-10-CM

## 2014-08-16 DIAGNOSIS — K219 Gastro-esophageal reflux disease without esophagitis: Secondary | ICD-10-CM | POA: Diagnosis not present

## 2014-08-16 DIAGNOSIS — F0150 Vascular dementia without behavioral disturbance: Secondary | ICD-10-CM | POA: Diagnosis not present

## 2014-08-16 DIAGNOSIS — E1140 Type 2 diabetes mellitus with diabetic neuropathy, unspecified: Secondary | ICD-10-CM

## 2014-08-16 DIAGNOSIS — I5032 Chronic diastolic (congestive) heart failure: Secondary | ICD-10-CM | POA: Diagnosis not present

## 2014-08-16 DIAGNOSIS — E785 Hyperlipidemia, unspecified: Secondary | ICD-10-CM

## 2014-08-16 NOTE — Progress Notes (Signed)
[name], male   DOB: 03/25/1929, 81 y.o.   MRN: [id number]    DATE: 08/16/14  Location:  [facility]    Place of Service: SNF (31)   Extended Emergency Contact Information Primary Emergency Contact: Cambria,Lestie C Address: [address]          [place], [place] [address] [place] Home Phone: [phone] Relation: Spouse  Advanced Directive information  DNR  Chief Complaint  Patient presents with  . Medical Management of Chronic Issues    HPI:  81 yo male long term resident seen today for f/u. He has no c/o. He had eye exam on 07/20/14. A1c 6.5% on 3/30th. CBGs 110-230s usually with occasional >250. No low BS reactions. CBG today 252. He has not seen wound care for feet in a while but states area has healed. He had some GI upset last week but it is improved. No N/V currently. Appetite ok and sleeping well.he does get propass BID to help with wound healing. No nursing issues. Pt is a poor historian due to dementia. Hx obtained from chart  Dementia - stable on namenda XR  DM - controlled on insulin   HTN - BP elevated. He takes furosemide, lisinopril, and lopressor  Hyperlipidemia - stable on tricor HF - stable on furosemide, ACE (-), BB and imdur  PAD - stable on ASA  Anxiety/depression - stable on lorazepam, wellbutrin  GERD - stable on omeprazole  Chronic pain - stable on oxycodone ER and oxycodone, voltaren gel, lidoderm patch and gabapentin  He takes vitamins/minerals also   Past Medical History  Diagnosis Date  . 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 Hypertension   . Peripheral vascular disease   . Depression   . Anxiety   . GERD (gastroesophageal reflux disease)   . Renal insufficiency   . Dyslipidemia   . DDD (degenerative disc disease), lumbosacral   . History of colon polyps   . History of MRSA infection     Hx chronic diabetic ulcer's secondary MRSA  . Ulcer of right ankle   . Insulin dependent  type 2 diabetes mellitus   . History of diabetic ulcer of foot   . Diabetic peripheral neuropathy   . Wheelchair dependent   . At high risk for falls   . Chronic diastolic congestive heart failure 06/13/2012    Echo in 2011 with grade 1 diastolic dysfunction and normal systolic function with EF 60%.     . Portal hypertension 01/15/2010    Qualifier: Diagnosis of  By: Everardo All MD, Sean A   . UNSPECIFIED PERIPHERAL VASCULAR DISEASE 01/15/2010    Qualifier: Diagnosis of  By: Everardo All MD, Cleophas Dunker     Past Surgical History  Procedure Laterality Date  . Laparoscopic cholecystectomy    . Inguinal hernia repair Left 11-30-2001  . Colonoscopy with esophagogastroduodenoscopy (egd)  09-21-2002  . Transthoracic echocardiogram  12-20-2009    severe LVH/  ef 60%/  grade I diastolic dysfunction/  AV sclerosis without stenosis/  mild LAE and RAE/    . Toe amputation    . Incision and drainage of wound Right 01/10/2014    Procedure: IRRIGATION AND DEBRIDEMENT RIGHT ANKLE WOUND WITH PLACEMENT OF ACELL;  Surgeon: Wayland Denis, DO;  Location: [facility];  Service: Plastics;  Laterality: Right;    Patient Care Team: Kirt Boys, DO as PCP - General (Internal Medicine) Sharee Holster, NP as Nurse Practitioner (Nurse Practitioner)  History   Social History  . Marital Status: Married    Spouse Name: N/A  . Number of Children: N/A  . Years of Education: N/A   Occupational History  .      Retired   Social History Main Topics  . Smoking status: Former Games developer  . Smokeless tobacco: Not on file  . Alcohol Use: No     Comment: hx alcohol abuse  in remission  . Drug Use: Not on file  . Sexual Activity: Not on file   Other Topics Concern  . Not on file   Social History Narrative   L[facility] at [facility]     reports that he has quit smoking. He does not have any smokeless tobacco history on file. He reports that he does not drink alcohol. His drug history is not on  file.  Immunization History  Administered Date(s) Administered  . Influenza Split 12/06/2011  . Influenza Whole 12/24/2012  . Influenza-Unspecified 12/15/2013  . PPD Test 03/29/2009  . Pneumococcal Conjugate-13 08/21/2005    No Known Allergies  Medications: Patient's Medications  New Prescriptions   No medications on file  Previous Medications   AMLODIPINE (NORVASC) 5 MG TABLET    Take 5 mg by mouth every morning.    ASPIRIN 81 MG TABLET    Take 81 mg by mouth every morning.    BUPROPION (WELLBUTRIN) 75 MG TABLET    Take 75 mg by mouth 2 (two) times daily.    CITALOPRAM (CELEXA) 20 MG TABLET    Take 20 mg by mouth every morning.    DORZOLAMIDE-TIMOLOL (COSOPT) 22.3-6.8 MG/ML OPHTHALMIC SOLUTION    Place 1 drop into both eyes 2 (two) times daily.   FENOFIBRATE (TRICOR) 145 MG TABLET    Take 145 mg by mouth every evening.    FUROSEMIDE (LASIX) 40 MG TABLET    Take 40 mg by mouth 2 (two) times daily.    GABAPENTIN (NEURONTIN) 100 MG CAPSULE    Take 100 mg by mouth 2 (two) times daily.   INSULIN ASPART (NOVOLOG) 100 UNIT/ML INJECTION    Inject 8 Units into the skin 3 (three) times daily before meals. Give an additional 2 units prior to meals for cbg >200   INSULIN GLARGINE (LANTUS) 100 UNIT/ML INJECTION    Inject 28 Units into the skin at bedtime.    ISOSORBIDE MONONITRATE (IMDUR) 60 MG 24 HR TABLET    Take 60 mg by mouth every morning.    LATANOPROST (XALATAN) 0.005 % OPHTHALMIC SOLUTION    Place 1 drop into both eyes at bedtime.     LIDOCAINE (LIDODERM) 5 %    Place 1 patch onto the skin every 12 (twelve) hours. Remove & Discard patch within 12 hours to right neck   LORAZEPAM (ATIVAN) 0.5 MG TABLET    Take 0.5 mg by mouth every 6 (six) hours as needed for anxiety. Take 0.25mg   tablet by mouth once daily   MAGNESIUM HYDROXIDE (MILK OF MAGNESIA) 400 MG/5ML SUSPENSION    Take by mouth daily as needed.     MEMANTINE HCL ER (NAMENDA XR) 14 MG CP24    Take 1 tablet by mouth every morning.    METOPROLOL (LOPRESSOR) 50 MG TABLET    Take 50 mg by mouth 2 (two) times daily.   MULTIPLE VITAMINS-MINERALS (DECUBI-VITE) CAPS    Take 1 capsule by mouth 2 (two) times daily.    OMEPRAZOLE (PRILOSEC) 20 MG CAPSULE    Take 20 mg  by mouth every morning.    ONDANSETRON (ZOFRAN) 4 MG TABLET    Take 4 mg by mouth every 6 (six) hours as needed for nausea or vomiting.   OXYCODONE (OXY-IR) 5 MG CAPSULE    Take one tablet by mouth every 8 hours as needed for pain.   OXYCODONE HCL ER (OXYCONTIN) 30 MG T12A    Take one tablet by mouth every 12 hours for pain   POLYETHYLENE GLYCOL POWDER (GLYCOLAX/MIRALAX) POWDER    Take 17 g by mouth 2 (two) times daily.    POTASSIUM CHLORIDE (K-DUR,KLOR-CON) 10 MEQ TABLET    Take 10 mEq by mouth every morning.    SENNA (SENOKOT) 8.6 MG TABLET    Take 2 tablets by mouth 2 (two) times daily.   Modified Medications   No medications on file  Discontinued Medications   No medications on file    Review of Systems  Unable to perform ROS: Dementia    Filed Vitals:   08/16/14 1601  BP: 176/80  Pulse: 58  Temp: 98 F (36.7 C)  Weight: 235 lb (106.595 kg)   Body mass index is 33.72 kg/(m^2).  Physical Exam  Constitutional: He appears well-developed.  Sitting in w/c in NAD. Frail appearing  HENT:  Mouth/Throat: Oropharynx is clear and moist.  Eyes: Pupils are equal, round, and reactive to light. No scleral icterus.  Neck: Neck supple. Carotid bruit is not present. No thyromegaly present.  Cardiovascular: Normal rate, regular rhythm, normal heart sounds and intact distal pulses.  Exam reveals no gallop and no friction rub.   No murmur heard. Trace LE edema b/l. Chronic venous stasis changes b/l LE. No calf TTP  Pulmonary/Chest: Effort normal and breath sounds normal. He has no wheezes. He has no rales. He exhibits no tenderness.  Abdominal: Soft. Bowel sounds are normal. He exhibits no distension, no abdominal bruit, no pulsatile midline mass and no mass. There is  no tenderness. There is no rebound and no guarding.  Musculoskeletal: He exhibits edema.  Lymphadenopathy:    He has no cervical adenopathy.  Neurological: He is alert.  Skin: Skin is warm and dry. Rash (venous stasis changes b/l LE) noted.  Psychiatric: He has a normal mood and affect. His behavior is normal.     Labs reviewed: No visits with results within 3 Month(s) from this visit. Latest known visit with results is:  Orders Only on 03/14/2014  Component Date Value Ref Range Status  . Sodium 03/14/2014 141  135 - 145 mEq/L Final  . Potassium 03/14/2014 3.9  3.5 - 5.3 mEq/L Final  . Chloride 03/14/2014 102  96 - 112 mEq/L Final  . CO2 03/14/2014 28  19 - 32 mEq/L Final  . Glucose, Bld 03/14/2014 81  70 - 99 mg/dL Final  . BUN 16/12/9602 25* 6 - 23 mg/dL Final  . Creat 54/11/8117 1.75* 0.50 - 1.35 mg/dL Final  . Calcium 14/78/2956 9.3  8.4 - 10.5 mg/dL Final  . Hgb O1H MFr Bld 03/14/2014 7.1* <5.7 % Final   Comment:                                                                        According to the ADA Clinical Practice  Recommendations for 2011, when HbA1c is used as a screening test:     >=6.5%   Diagnostic of Diabetes Mellitus            (if abnormal result is confirmed)   5.7-6.4%   Increased risk of developing Diabetes Mellitus   References:Diagnosis and Classification of Diabetes Mellitus,Diabetes Care,2011,34(Suppl 1):S62-S69 and Standards of Medical Care in         Diabetes - 2011,Diabetes Care,2011,34 (Suppl 1):S11-S61.     . Mean Plasma Glucose 03/14/2014 157* <117 mg/dL Final    No results found.   Assessment/Plan   ICD-9-CM ICD-10-CM   1. Essential hypertension - uncontrolled 401.9 I10   2. Type 2 diabetes mellitus with neurological manifestations, controlled - stable 250.60 E11.40   3. Chronic diastolic congestive heart failure - stable 428.32 I50.32    428.0    4. Depression with anxiety - stable 300.4 F41.8   5. Dyslipidemia - stable 272.4 E78.5    6. Primary osteoarthritis involving multiple joints - stable 715.09 M15.0   7. Vascular dementia, without behavioral disturbance - stable 290.40 F01.50   8. Gastroesophageal reflux disease, esophagitis presence not specified - stable 530.81 K21.9     --increase lisinopril 5 mg daily  --cont other meds as ordered  --monitor BP BID as ordered  --check A1c and BMP next month as ordered  --f/u with specialists as indicated  --will follow  Lakisa Lotz S. Ancil Linsey  [facility] [address] [place], [place] [address] [phone] Cell (Monday-Friday 8 AM - 5 PM) [phone] After 5 PM and follow prompts

## 2014-09-06 ENCOUNTER — Other Ambulatory Visit: Payer: Self-pay

## 2014-09-06 MED ORDER — OXYCODONE HCL ER 30 MG PO T12A: EXTENDED_RELEASE_TABLET | ORAL | Status: DC

## 2014-09-06 NOTE — Telephone Encounter (Signed)
RX faxed to [facility] @ [phone], phone number [phone] 

## 2014-09-12 ENCOUNTER — Non-Acute Institutional Stay (SKILLED_NURSING_FACILITY): Payer: Medicare Other | Admitting: Adult Health

## 2014-09-12 DIAGNOSIS — I83014 Varicose veins of right lower extremity with ulcer of heel and midfoot: Secondary | ICD-10-CM

## 2014-09-12 DIAGNOSIS — I83011 Varicose veins of right lower extremity with ulcer of thigh: Secondary | ICD-10-CM | POA: Diagnosis not present

## 2014-09-12 DIAGNOSIS — I119 Hypertensive heart disease without heart failure: Secondary | ICD-10-CM | POA: Diagnosis not present

## 2014-09-12 DIAGNOSIS — I83015 Varicose veins of right lower extremity with ulcer other part of foot: Secondary | ICD-10-CM

## 2014-09-12 DIAGNOSIS — E1142 Type 2 diabetes mellitus with diabetic polyneuropathy: Secondary | ICD-10-CM

## 2014-09-12 DIAGNOSIS — E1140 Type 2 diabetes mellitus with diabetic neuropathy, unspecified: Secondary | ICD-10-CM

## 2014-09-12 DIAGNOSIS — I5032 Chronic diastolic (congestive) heart failure: Secondary | ICD-10-CM | POA: Diagnosis not present

## 2014-09-12 DIAGNOSIS — K5903 Drug induced constipation: Secondary | ICD-10-CM

## 2014-09-12 DIAGNOSIS — G629 Polyneuropathy, unspecified: Secondary | ICD-10-CM

## 2014-09-12 DIAGNOSIS — E1149 Type 2 diabetes mellitus with other diabetic neurological complication: Secondary | ICD-10-CM

## 2014-09-12 DIAGNOSIS — K219 Gastro-esophageal reflux disease without esophagitis: Secondary | ICD-10-CM

## 2014-09-12 DIAGNOSIS — I83019 Varicose veins of right lower extremity with ulcer of unspecified site: Secondary | ICD-10-CM

## 2014-09-12 DIAGNOSIS — K5909 Other constipation: Secondary | ICD-10-CM

## 2014-09-12 DIAGNOSIS — T50905A Adverse effect of unspecified drugs, medicaments and biological substances, initial encounter: Secondary | ICD-10-CM | POA: Diagnosis not present

## 2014-09-12 DIAGNOSIS — G8929 Other chronic pain: Secondary | ICD-10-CM | POA: Diagnosis not present

## 2014-09-12 DIAGNOSIS — F418 Other specified anxiety disorders: Secondary | ICD-10-CM

## 2014-09-12 DIAGNOSIS — E785 Hyperlipidemia, unspecified: Secondary | ICD-10-CM

## 2014-09-12 DIAGNOSIS — I83012 Varicose veins of right lower extremity with ulcer of calf: Secondary | ICD-10-CM

## 2014-09-12 DIAGNOSIS — F0150 Vascular dementia without behavioral disturbance: Secondary | ICD-10-CM

## 2014-09-12 DIAGNOSIS — I83013 Varicose veins of right lower extremity with ulcer of ankle: Secondary | ICD-10-CM

## 2014-09-12 DIAGNOSIS — L97919 Non-pressure chronic ulcer of unspecified part of right lower leg with unspecified severity: Secondary | ICD-10-CM

## 2014-09-12 DIAGNOSIS — I83018 Varicose veins of right lower extremity with ulcer other part of lower leg: Secondary | ICD-10-CM

## 2014-10-10 ENCOUNTER — Other Ambulatory Visit: Payer: Self-pay | Admitting: *Deleted

## 2014-10-10 MED ORDER — OXYCODONE HCL ER 30 MG PO T12A: EXTENDED_RELEASE_TABLET | ORAL | Status: DC

## 2014-10-10 NOTE — Telephone Encounter (Signed)
[facility] 

## 2014-10-17 ENCOUNTER — Non-Acute Institutional Stay (SKILLED_NURSING_FACILITY): Payer: Medicare Other | Admitting: Adult Health

## 2014-10-17 ENCOUNTER — Other Ambulatory Visit: Payer: Self-pay | Admitting: *Deleted

## 2014-10-17 ENCOUNTER — Encounter: Payer: Self-pay | Admitting: Adult Health

## 2014-10-17 DIAGNOSIS — L97919 Non-pressure chronic ulcer of unspecified part of right lower leg with unspecified severity: Secondary | ICD-10-CM

## 2014-10-17 DIAGNOSIS — G8929 Other chronic pain: Secondary | ICD-10-CM | POA: Diagnosis not present

## 2014-10-17 DIAGNOSIS — H409 Unspecified glaucoma: Secondary | ICD-10-CM

## 2014-10-17 DIAGNOSIS — E785 Hyperlipidemia, unspecified: Secondary | ICD-10-CM | POA: Diagnosis not present

## 2014-10-17 DIAGNOSIS — I83018 Varicose veins of right lower extremity with ulcer other part of lower leg: Secondary | ICD-10-CM

## 2014-10-17 DIAGNOSIS — I83011 Varicose veins of right lower extremity with ulcer of thigh: Secondary | ICD-10-CM

## 2014-10-17 DIAGNOSIS — I83012 Varicose veins of right lower extremity with ulcer of calf: Secondary | ICD-10-CM

## 2014-10-17 DIAGNOSIS — F418 Other specified anxiety disorders: Secondary | ICD-10-CM

## 2014-10-17 DIAGNOSIS — I83015 Varicose veins of right lower extremity with ulcer other part of foot: Secondary | ICD-10-CM

## 2014-10-17 DIAGNOSIS — T50905A Adverse effect of unspecified drugs, medicaments and biological substances, initial encounter: Secondary | ICD-10-CM

## 2014-10-17 DIAGNOSIS — E1140 Type 2 diabetes mellitus with diabetic neuropathy, unspecified: Secondary | ICD-10-CM

## 2014-10-17 DIAGNOSIS — I5032 Chronic diastolic (congestive) heart failure: Secondary | ICD-10-CM | POA: Diagnosis not present

## 2014-10-17 DIAGNOSIS — I119 Hypertensive heart disease without heart failure: Secondary | ICD-10-CM | POA: Diagnosis not present

## 2014-10-17 DIAGNOSIS — K219 Gastro-esophageal reflux disease without esophagitis: Secondary | ICD-10-CM

## 2014-10-17 DIAGNOSIS — E1142 Type 2 diabetes mellitus with diabetic polyneuropathy: Secondary | ICD-10-CM

## 2014-10-17 DIAGNOSIS — K5909 Other constipation: Secondary | ICD-10-CM | POA: Diagnosis not present

## 2014-10-17 DIAGNOSIS — I83013 Varicose veins of right lower extremity with ulcer of ankle: Secondary | ICD-10-CM

## 2014-10-17 DIAGNOSIS — G629 Polyneuropathy, unspecified: Secondary | ICD-10-CM

## 2014-10-17 DIAGNOSIS — I83019 Varicose veins of right lower extremity with ulcer of unspecified site: Secondary | ICD-10-CM

## 2014-10-17 DIAGNOSIS — D649 Anemia, unspecified: Secondary | ICD-10-CM | POA: Diagnosis not present

## 2014-10-17 DIAGNOSIS — K5903 Drug induced constipation: Secondary | ICD-10-CM

## 2014-10-17 DIAGNOSIS — F0150 Vascular dementia without behavioral disturbance: Secondary | ICD-10-CM | POA: Diagnosis not present

## 2014-10-17 DIAGNOSIS — E1149 Type 2 diabetes mellitus with other diabetic neurological complication: Secondary | ICD-10-CM

## 2014-10-17 DIAGNOSIS — I83014 Varicose veins of right lower extremity with ulcer of heel and midfoot: Secondary | ICD-10-CM

## 2014-10-17 MED ORDER — LORAZEPAM 0.5 MG PO TABS: ORAL_TABLET | ORAL | Status: DC

## 2014-10-17 NOTE — Telephone Encounter (Signed)
[facility] 

## 2014-10-17 NOTE — Progress Notes (Signed)
[name], male   DOB: [birth year], 81 y.o.   MRN: [id number]   Facility: [facility]      No Known Allergies  Chief Complaint  Patient presents with  . Medical Management of Chronic Issues    HPI:  He is a long term resident of this facility being seen for the management of his chronic illnesses. Overall his status is without change. He is not voicing any concerns today; states that he is feeling good. There are no nursing concerns today.    Past Medical History  Diagnosis Date  . 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 Hypertension   . Peripheral vascular disease   . Depression   . Anxiety   . GERD (gastroesophageal reflux disease)   . Renal insufficiency   . Dyslipidemia   . DDD (degenerative disc disease), lumbosacral   . History of colon polyps   . History of MRSA infection     Hx chronic diabetic ulcer's secondary MRSA  . Ulcer of right ankle   . Insulin dependent type 2 diabetes mellitus   . History of diabetic ulcer of foot   . Diabetic peripheral neuropathy   . Wheelchair dependent   . At high risk for falls   . Chronic diastolic congestive heart failure 06/13/2012    Echo in 2011 with grade 1 diastolic dysfunction and normal systolic function with EF 60%.     . Portal hypertension 01/15/2010    Qualifier: Diagnosis of  By: Everardo All MD, Sean A   . UNSPECIFIED PERIPHERAL VASCULAR DISEASE 01/15/2010    Qualifier: Diagnosis of  By: Everardo All MD, Cleophas Dunker     Past Surgical History  Procedure Laterality Date  . Laparoscopic cholecystectomy    . Inguinal hernia repair Left 11-30-2001  . Colonoscopy with esophagogastroduodenoscopy (egd)  09-21-2002  . Transthoracic echocardiogram  12-20-2009    severe LVH/  ef 60%/  grade I diastolic dysfunction/  AV sclerosis without stenosis/  mild LAE and RAE/    . Toe amputation    . Incision and drainage of wound Right 01/10/2014    Procedure: IRRIGATION AND DEBRIDEMENT RIGHT ANKLE WOUND WITH  PLACEMENT OF ACELL;  Surgeon: Wayland Denis, DO;  Location: [facility];  Service: Plastics;  Laterality: Right;    VITAL SIGNS BP 138/82 mmHg  Pulse 62  Ht  (1.778 m)  Wt 244 lb (110.678 kg)  BMI 35.01 kg/m2  Patient's Medications  New Prescriptions   No medications on file  Previous Medications   AMLODIPINE (NORVASC) 5 MG TABLET    Take 10 mg by mouth every morning.    ASPIRIN 81 MG TABLET    Take 81 mg by mouth every morning.    BUPROPION (WELLBUTRIN) 75 MG TABLET    Take 75 mg by mouth 2 (two) times daily.    DICLOFENAC SODIUM (VOLTAREN) 1 % GEL    Apply 2 g topically 3 (three) times daily. To right wrist   DORZOLAMIDE-TIMOLOL (COSOPT) 22.3-6.8 MG/ML OPHTHALMIC SOLUTION    Place 1 drop into both eyes 2 (two) times daily.   FENOFIBRATE (TRICOR) 145 MG TABLET    Take 145 mg by mouth every evening.    FUROSEMIDE (LASIX) 40 MG TABLET    Take 40 mg by mouth 2 (two) times daily.    GABAPENTIN (NEURONTIN) 100 MG CAPSULE    Take 100 mg by mouth 2 (two) times daily.   INSULIN ASPART (NOVOLOG) 100 UNIT/ML INJECTION  Inject 8 Units into the skin 3 (three) times daily before meals. for cbg >=150   INSULIN GLARGINE (LANTUS) 100 UNIT/ML INJECTION    Inject 28 Units into the skin at bedtime.    ISOSORBIDE MONONITRATE (IMDUR) 60 MG 24 HR TABLET    Take 60 mg by mouth every morning.    LATANOPROST (XALATAN) 0.005 % OPHTHALMIC SOLUTION    Place 1 drop into both eyes at bedtime.     LIDOCAINE (LIDODERM) 5 %    Place 1 patch onto the skin every 12 (twelve) hours. Remove & Discard patch within 12 hours to right neck   LISINOPRIL (PRINIVIL,ZESTRIL) 5 MG TABLET    Take 10 mg by mouth daily.    LORAZEPAM (ATIVAN) 0.5 MG TABLET    Take 1/2 tablet by mouth once daily for anxiety   MAGNESIUM HYDROXIDE (MILK OF MAGNESIA) 400 MG/5ML SUSPENSION    Take 60 mLs by mouth daily as needed.    MEMANTINE HCL ER (NAMENDA XR) 14 MG CP24    Take 1 tablet by mouth every morning.   METOPROLOL  (LOPRESSOR) 50 MG TABLET    Take 50 mg by mouth 2 (two) times daily.   MULTIPLE VITAMINS-MINERALS (DECUBI-VITE) CAPS    Take 1 capsule by mouth 2 (two) times daily.    OMEPRAZOLE (PRILOSEC) 20 MG CAPSULE    Take 20 mg by mouth every morning.    OXYCODONE (OXY-IR) 5 MG CAPSULE    Take one tablet by mouth every 8 hours as needed for pain.   OXYCODONE HCL ER (OXYCONTIN) 30 MG T12A    Take one tablet by mouth every 12 hours for pain NO NOT CUT/CRUSH/CHEW   POLYETHYLENE GLYCOL POWDER (GLYCOLAX/MIRALAX) POWDER    Take 17 g by mouth 2 (two) times daily.    POTASSIUM CHLORIDE (K-DUR,KLOR-CON) 10 MEQ TABLET    Take 10 mEq by mouth 2 (two) times daily.    SENNA (SENOKOT) 8.6 MG TABLET    Take 2 tablets by mouth 2 (two) times daily.   Modified Medications   No medications on file  Discontinued Medications   CITALOPRAM (CELEXA) 20 MG TABLET    Take 20 mg by mouth every morning.      SIGNIFICANT DIAGNOSTIC EXAMS   03-14-14: right ankle tib/fib mri: 1. 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 2. T2 hyperintense probable draining abscess deep to ulceration. 3. Ankle effusion. This is probably reactive. Septic arthritis is less likely. 4. Infectious tenosynovitis of the peroneal tendons. 5. No abscess in the proximal RIGHT leg.  06-08-14: right hand x-ray; mild osteoarthritis  06-08-14: right wrist x-ray; osteoarthritis right wrist  06-09-14: pelvic and left hip x-ray: mild degenerative disease left hip no significant change since 09-28-13.    LABS REVIEWED:   09-01-13: glucose 224; bun 48; creat 2.38; k+ 3.7; na++140; hgb a1c 6.5  11-15-13: urine micro-albumin 17.73 12-03-13: glucose 174; bun 27; creat 1.71; k+3.8; na++140 hgb a1c 6.9 02-17-14: right ankle wound culture: mrsa: zyvox  02-28-14: wbc 8.8; hgb 8.5; hct 25.5; mcv 87.1; plt 158  05-02-14: glucose 194; bun 29; creat 1.81; k+3.6; na++138  06-06-14: glucose 140; bun 23; creat 1.63; k+3.5; na++140;  hgb  a1c  6.4  09-12-14: glucose 70; bun 24; creat 1.58; k+ 3.4; na++140  09-19-14: wbc 9.9; hgb 12.9; hct 37.8; mcv 90.4; plt 141; glucose 113; bun 28; creat 1.95; k+ 3.8; na++141; liver normal albumin 4.0; chol 157; ldl 91; trig 200; hdl 26; hgb  a1c 6.1      Review of Systems Constitutional: Negative for appetite change and fatigue.  HENT: Negative for congestion.   Respiratory: Negative for cough, chest tightness and shortness of breath.   Cardiovascular: Negative for chest pain, palpitations and leg swelling.  Gastrointestinal: Negative for nausea, abdominal pain, diarrhea and constipation.  Musculoskeletal: Negative for myalgias and arthralgias.  Skin: Negative for pallor.       Chronic right ankle ulcer   Neurological: Negative for dizziness.  Psychiatric/Behavioral: The patient is not nervous/anxious.       Physical Exam Constitutional: No distress.  Overweight   Eyes: Conjunctivae are normal.  Neck: Neck supple. No JVD present. No thyromegaly present.  Cardiovascular: Normal rate, regular rhythm and intact distal pulses.   Respiratory: Effort normal and breath sounds normal. No respiratory distress. He has no wheezes.  GI: Soft. Bowel sounds are normal. He exhibits no distension. There is no tenderness.  Musculoskeletal: He exhibits no edema.  Able to move all extremities   Lymphadenopathy:    He has no cervical adenopathy.  Neurological: He is alert.  Skin: Skin is warm and dry. He is not diaphoretic.  Chronic wound treated at wound clinic   Psychiatric: He has a normal mood and affect.      ASSESSMENT/ PLAN:  1. Hypertension: will continue lopressor 50 mg twice daily; norvasc 10 mg daily asa 81 mg daily; will increase lisinopril to 10 mg daily and will monitor his status.   2. Chronic diastolic heart failure: will continue lasix 40 mg twice daily lopressor 50 mg twice daily imdur 60 mg daily   3. Constipation: will continue miralax twice daily; senna 2 tabs  twice daily   4. Hypokalemia will continue  k+ 10 meq twice daily k+ is 3.8  5. GERD: will continue prilosec 20 mg daily   6. Dyslipidemia: will continue tricor 145 mg daily; will begin lipitor 10 mg daily; ldl is 91; trig is 200   7. Diabetes: will continue lantus 28 units daily; novolog 8 units prior to meals for cbg >=150 hgb a1c is 6.1   8. Depression with anxiety will continue wellbutrin sr 75 mg twice daily; ativan 0.25 mg daily and 0.5 g every 8 hours as needed for anxiety and will monitor  9. Vascular dementia: is without change; will continue namenda xr 14 mg daily and will monitor   10. Chronic pain: his pain is presently being managed will continue oxycontin 30 mg twice daily; neurontin 100 mg twice daily; lidoderm patch to his right neck daily; voltaren gel 2 gm to right wrist three times daily  and oxycodone 5 mg every 8 hours as needed   12. Glaucoma; will continue latanoprost to both eyes nightly and cosopt to both eyes twice daily   13. Stasis leg ulcer: goes to wound clinic to right outer ankle ulcer; will continue to monitor his status.   12. Anemia: his hgb is 12.9; will monitor     Time spent with patient 45   minutes >50% time spent counseling; reviewing medical record; tests; labs; and developing future plan of care     Synthia Innocent NP [facility]  Contact [phone] Monday through Friday 8am- 5pm  After hours call [phone]

## 2014-10-17 NOTE — Progress Notes (Signed)
[name], male   DOB: 1930/03/04, 81 y.o.   MRN: [id number]    Facility: [facility]      No Known Allergies  Chief Complaint  Patient presents with  . Medical Management of Chronic Issues    HPI:  He is a long term resident of this facility being seen for the management of his chronic illnesses. Overall his status is stable; he is not voicing any complaints or concerns at this time. There are no nursing concerns at this time.    Past Medical History  Diagnosis Date  . 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 Hypertension   . Peripheral vascular disease   . Depression   . Anxiety   . GERD (gastroesophageal reflux disease)   . Renal insufficiency   . Dyslipidemia   . DDD (degenerative disc disease), lumbosacral   . History of colon polyps   . History of MRSA infection     Hx chronic diabetic ulcer's secondary MRSA  . Ulcer of right ankle   . Insulin dependent type 2 diabetes mellitus   . History of diabetic ulcer of foot   . Diabetic peripheral neuropathy   . Wheelchair dependent   . At high risk for falls   . Chronic diastolic congestive heart failure 06/13/2012    Echo in 2011 with grade 1 diastolic dysfunction and normal systolic function with EF 60%.     . Portal hypertension 01/15/2010    Qualifier: Diagnosis of  By: Everardo All MD, Sean A   . UNSPECIFIED PERIPHERAL VASCULAR DISEASE 01/15/2010    Qualifier: Diagnosis of  By: Everardo All MD, Cleophas Dunker     Past Surgical History  Procedure Laterality Date  . Laparoscopic cholecystectomy    . Inguinal hernia repair Left 11-30-2001  . Colonoscopy with esophagogastroduodenoscopy (egd)  09-21-2002  . Transthoracic echocardiogram  12-20-2009    severe LVH/  ef 60%/  grade I diastolic dysfunction/  AV sclerosis without stenosis/  mild LAE and RAE/    . Toe amputation    . Incision and drainage of wound Right 01/10/2014    Procedure: IRRIGATION AND DEBRIDEMENT RIGHT ANKLE WOUND WITH PLACEMENT OF  ACELL;  Surgeon: Wayland Denis, DO;  Location: [facility];  Service: Plastics;  Laterality: Right;    VITAL SIGNS BP 152/68 mmHg  Pulse 68  Ht 5\' 10"  (1.778 m)  Wt 237 lb (107.502 kg)  BMI 34.01 kg/m2  Patient's Medications  New Prescriptions   No medications on file  Previous Medications   AMLODIPINE (NORVASC) 5 MG TABLET    Take 10 mg by mouth every morning.    ASPIRIN 81 MG TABLET    Take 81 mg by mouth every morning.    BUPROPION (WELLBUTRIN) 75 MG TABLET    Take 75 mg by mouth 2 (two) times daily.    CITALOPRAM (CELEXA) 20 MG TABLET    Take 20 mg by mouth every morning.    DICLOFENAC SODIUM (VOLTAREN) 1 % GEL    Apply 2 g topically 3 (three) times daily. To right wrist   DORZOLAMIDE-TIMOLOL (COSOPT) 22.3-6.8 MG/ML OPHTHALMIC SOLUTION    Place 1 drop into both eyes 2 (two) times daily.   FENOFIBRATE (TRICOR) 145 MG TABLET    Take 145 mg by mouth every evening.    FUROSEMIDE (LASIX) 40 MG TABLET    Take 40 mg by mouth 2 (two) times daily.    GABAPENTIN (NEURONTIN) 100 MG CAPSULE    Take  100 mg by mouth 2 (two) times daily.   INSULIN ASPART (NOVOLOG) 100 UNIT/ML INJECTION    Inject 8 Units into the skin 3 (three) times daily before meals. for cbg >=150   INSULIN GLARGINE (LANTUS) 100 UNIT/ML INJECTION    Inject 28 Units into the skin at bedtime.    ISOSORBIDE MONONITRATE (IMDUR) 60 MG 24 HR TABLET    Take 60 mg by mouth every morning.    LATANOPROST (XALATAN) 0.005 % OPHTHALMIC SOLUTION    Place 1 drop into both eyes at bedtime.     LIDOCAINE (LIDODERM) 5 %    Place 1 patch onto the skin every 12 (twelve) hours. Remove & Discard patch within 12 hours to right neck   LISINOPRIL (PRINIVIL,ZESTRIL) 5 MG TABLET    Take 5 mg by mouth daily.   LORAZEPAM (ATIVAN) 0.5 MG TABLET    Take 1/2 tablet by mouth once daily for anxiety   MAGNESIUM HYDROXIDE (MILK OF MAGNESIA) 400 MG/5ML SUSPENSION    Take by mouth daily as needed.     MEMANTINE HCL ER (NAMENDA XR) 14 MG CP24    Take  1 tablet by mouth every morning.   METOPROLOL (LOPRESSOR) 50 MG TABLET    Take 50 mg by mouth 2 (two) times daily.   MULTIPLE VITAMINS-MINERALS (DECUBI-VITE) CAPS    Take 1 capsule by mouth 2 (two) times daily.    OMEPRAZOLE (PRILOSEC) 20 MG CAPSULE    Take 20 mg by mouth every morning.    OXYCODONE (OXY-IR) 5 MG CAPSULE    Take one tablet by mouth every 8 hours as needed for pain.   OXYCODONE HCL ER (OXYCONTIN) 30 MG T12A    Take one tablet by mouth every 12 hours for pain NO NOT CUT/CRUSH/CHEW   POLYETHYLENE GLYCOL POWDER (GLYCOLAX/MIRALAX) POWDER    Take 17 g by mouth 2 (two) times daily.    POTASSIUM CHLORIDE (K-DUR,KLOR-CON) 10 MEQ TABLET    Take 10 mEq by mouth every morning.    SENNA (SENOKOT) 8.6 MG TABLET    Take 2 tablets by mouth 2 (two) times daily.   Modified Medications   No medications on file  Discontinued Medications   ONDANSETRON (ZOFRAN) 4 MG TABLET    Take 4 mg by mouth every 6 (six) hours as needed for nausea or vomiting.     SIGNIFICANT DIAGNOSTIC EXAMS   03-14-14: right ankle tib/fib mri: 1. 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 2. T2 hyperintense probable draining abscess deep to ulceration. 3. Ankle effusion. This is probably reactive. Septic arthritis is less likely. 4. Infectious tenosynovitis of the peroneal tendons. 5. No abscess in the proximal RIGHT leg.  06-08-14: right hand x-ray; mild osteoarthritis  06-08-14: right wrist x-ray; osteoarthritis right wrist  06-09-14: pelvic and left hip x-ray: mild degenerative disease left hip no significant change since 09-28-13.    LABS REVIEWED:   09-01-13: glucose 224; bun 48; creat 2.38; k+ 3.7; na++140; hgb a1c 6.5  11-15-13: urine micro-albumin 17.73 12-03-13: glucose 174; bun 27; creat 1.71; k+3.8; na++140 hgb a1c 6.9 02-17-14: right ankle wound culture: mrsa: zyvox  02-28-14: wbc 8.8; hgb 8.5; hct 25.5; mcv 87.1; plt 158  05-02-14: glucose 194; bun 29; creat  1.81; k+3.6; na++138  06-06-14: glucose 140; bun 23; creat 1.63; k+3.5; na++140; hgb  a1c  6.4  09-12-14: glucose 70; bun 24; creat 1.58; k+ 3.4; na++140      Review of Systems  Constitutional: Negative for  appetite change and fatigue.  HENT: Negative for congestion.   Respiratory: Negative for cough, chest tightness and shortness of breath.   Cardiovascular: Negative for chest pain, palpitations and leg swelling.  Gastrointestinal: Negative for nausea, abdominal pain, diarrhea and constipation.  Musculoskeletal: Negative for myalgias and arthralgias.  Skin: Negative for pallor.       Chronic right ankle ulcer   Neurological: Negative for dizziness.  Psychiatric/Behavioral: The patient is not nervous/anxious.       Physical Exam  Constitutional: No distress.  Overweight   Eyes: Conjunctivae are normal.  Neck: Neck supple. No JVD present. No thyromegaly present.  Cardiovascular: Normal rate, regular rhythm and intact distal pulses.   Respiratory: Effort normal and breath sounds normal. No respiratory distress. He has no wheezes.  GI: Soft. Bowel sounds are normal. He exhibits no distension. There is no tenderness.  Musculoskeletal: He exhibits no edema.  Able to move all extremities   Lymphadenopathy:    He has no cervical adenopathy.  Neurological: He is alert.  Skin: Skin is warm and dry. He is not diaphoretic.  Chronic wound treated at wound clinic   Psychiatric: He has a normal mood and affect.     ASSESSMENT/ PLAN:  1. Hypertension: will continue lopressor 50 mg twice daily; norvasc 10 mg daily asa 81 mg daily; will increase lisinopril to 10 mg daily and will monitor his status.   2. Chronic diastolic heart failure: will continue lasix 40 mg twice daily lopressor 50 mg twice daily imdur 60 mg daily   3. Constipation: will continue miralax twice daily; senna 2 tabs twice daily   4. Hypokalemia will increase k+ 10 meq twice daily   5. GERD: will continue prilosec 20  mg daily   6. Dyslipidemia: will continue tricor 145 mg daily;   7. Diabetes: will continue lantus 28 units daily; novolog 8 units prior to meals for cbg >=150  8. Depression with anxiety will continue wellbutrin sr 75 mg twice daily; ativan 0.25 mg daily and 0.5 g every 8 hours as needed for anxiety and will monitor  9. Vascular dementia: is without change; will continue namenda xr 14 mg daily and will monitor   10. Chronic pain: his pain is presently being managed will continue oxycontin 30 mg twice daily; neurontin 100 mg twice daily; lidoderm patch to his right neck daily; and oxycodone 5 mg every 8 hours as needed   12. Glaucoma; will continue latanoprost to both eyes nightly and cosopt to both eyes twice daily   13. Stasis leg ulcer: goes to wound clinic to right outer ankle ulcer; will continue to monitor his status.   12. Anemia: his hgb is 8.5; will monitor   Will check cbc; lipids; cmp; hgb a1c in one week   Time spent with patient  45  minutes >50% time spent counseling; reviewing medical record; tests; labs; and developing future plan of care    Synthia Innocent NP [facility]  Contact [phone] Monday through Friday 8am- 5pm  After hours call [phone]

## 2014-10-31 ENCOUNTER — Non-Acute Institutional Stay (SKILLED_NURSING_FACILITY): Payer: Medicare Other | Admitting: Adult Health

## 2014-10-31 DIAGNOSIS — I119 Hypertensive heart disease without heart failure: Secondary | ICD-10-CM | POA: Diagnosis not present

## 2014-12-02 ENCOUNTER — Non-Acute Institutional Stay (SKILLED_NURSING_FACILITY): Payer: Medicare Other | Admitting: Adult Health

## 2014-12-02 DIAGNOSIS — E1142 Type 2 diabetes mellitus with diabetic polyneuropathy: Secondary | ICD-10-CM | POA: Diagnosis not present

## 2014-12-02 DIAGNOSIS — T50905A Adverse effect of unspecified drugs, medicaments and biological substances, initial encounter: Secondary | ICD-10-CM

## 2014-12-02 DIAGNOSIS — F0150 Vascular dementia without behavioral disturbance: Secondary | ICD-10-CM | POA: Diagnosis not present

## 2014-12-02 DIAGNOSIS — H409 Unspecified glaucoma: Secondary | ICD-10-CM

## 2014-12-02 DIAGNOSIS — E1149 Type 2 diabetes mellitus with other diabetic neurological complication: Secondary | ICD-10-CM

## 2014-12-02 DIAGNOSIS — D649 Anemia, unspecified: Secondary | ICD-10-CM | POA: Diagnosis not present

## 2014-12-02 DIAGNOSIS — I5032 Chronic diastolic (congestive) heart failure: Secondary | ICD-10-CM | POA: Diagnosis not present

## 2014-12-02 DIAGNOSIS — G629 Polyneuropathy, unspecified: Secondary | ICD-10-CM | POA: Diagnosis not present

## 2014-12-02 DIAGNOSIS — E785 Hyperlipidemia, unspecified: Secondary | ICD-10-CM

## 2014-12-02 DIAGNOSIS — K5909 Other constipation: Secondary | ICD-10-CM

## 2014-12-02 DIAGNOSIS — I119 Hypertensive heart disease without heart failure: Secondary | ICD-10-CM | POA: Diagnosis not present

## 2014-12-02 DIAGNOSIS — E1140 Type 2 diabetes mellitus with diabetic neuropathy, unspecified: Secondary | ICD-10-CM

## 2014-12-02 DIAGNOSIS — K5903 Drug induced constipation: Secondary | ICD-10-CM

## 2014-12-09 LAB — BASIC METABOLIC PANEL
BUN: 19 mg/dL (ref 4–21)
Creatinine: 1.4 mg/dL — AB (ref 0.6–1.3)
GLUCOSE: 159 mg/dL
Sodium: 139 mmol/L (ref 137–147)

## 2014-12-09 LAB — HEMOGLOBIN A1C: Hemoglobin A1C: 6.2

## 2014-12-26 LAB — BASIC METABOLIC PANEL: Potassium: 3.6 mmol/L (ref 3.4–5.3)

## 2014-12-27 ENCOUNTER — Non-Acute Institutional Stay (SKILLED_NURSING_FACILITY): Payer: Medicare Other | Admitting: Internal Medicine

## 2014-12-27 ENCOUNTER — Encounter: Payer: Self-pay | Admitting: Internal Medicine

## 2014-12-27 DIAGNOSIS — K219 Gastro-esophageal reflux disease without esophagitis: Secondary | ICD-10-CM

## 2014-12-27 DIAGNOSIS — F418 Other specified anxiety disorders: Secondary | ICD-10-CM

## 2014-12-27 DIAGNOSIS — E1149 Type 2 diabetes mellitus with other diabetic neurological complication: Secondary | ICD-10-CM | POA: Diagnosis not present

## 2014-12-27 DIAGNOSIS — I5032 Chronic diastolic (congestive) heart failure: Secondary | ICD-10-CM | POA: Diagnosis not present

## 2014-12-27 DIAGNOSIS — I10 Essential (primary) hypertension: Secondary | ICD-10-CM | POA: Diagnosis not present

## 2014-12-27 DIAGNOSIS — E785 Hyperlipidemia, unspecified: Secondary | ICD-10-CM

## 2014-12-27 DIAGNOSIS — K5903 Drug induced constipation: Secondary | ICD-10-CM | POA: Diagnosis not present

## 2014-12-27 DIAGNOSIS — R1013 Epigastric pain: Secondary | ICD-10-CM

## 2014-12-27 DIAGNOSIS — G8929 Other chronic pain: Secondary | ICD-10-CM

## 2014-12-27 DIAGNOSIS — F0150 Vascular dementia without behavioral disturbance: Secondary | ICD-10-CM

## 2014-12-27 NOTE — Progress Notes (Signed)
[name], male   DOB: 1929-05-20, 81 y.o.   MRN: [id number]    DATE: 12/27/14  Location:  [facility]    Place of Service: SNF (31)   Extended Emergency Contact Information Primary Emergency Contact: Kopper,Lestie C Address: [address]          [place], [place] [address] [place] Home Phone: [phone] Relation: Spouse  Advanced Directive information  DNR  Chief Complaint  Patient presents with  . Medical Management of Chronic Issues    HPI:  81 yo male long term resident seen today for f/u. He c/o abdominal pain but no N/V. No increased belching. He has a hx GERD. He takes omeprazole  daily and prn zofran. No change in bowel/bladder habits. He is a poor historian due to dementia. Hx obtained from chart. No nursing issues. no falls  HTN - stable on amlodipine, clonidine, furosemide, imdur, lisinopril, lopressor  Hyperlipidemia - stable on tricor  Mood - stable on lorazepam and wellbutrin  Dementia - stable on namenda xr  Chronic pain/DDD - gabapentin helps neuropathy. He uses lidocaine patches and takes ER oxycontin as well as oxycodone IR. voltaren gel used as an antiflammatory  PAD - takes ASA daily  DM - CBGs 180 - 230s, occasional 280-380s. He is on lantus and SSI  He takes pro pass for wound healing.   Past Medical History  Diagnosis Date  . 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 Hypertension   . Peripheral vascular disease (HCC)   . Depression   . Anxiety   . GERD (gastroesophageal reflux disease)   . Renal insufficiency   . Dyslipidemia   . DDD (degenerative disc disease), lumbosacral   . History of colon polyps   . History of MRSA infection     Hx chronic diabetic ulcer's secondary MRSA  . Ulcer of right ankle (HCC)   . Insulin dependent type 2 diabetes mellitus (HCC)   . History of diabetic ulcer of foot   . Diabetic peripheral neuropathy (HCC)   . Wheelchair dependent   . At high risk  for falls   . Chronic diastolic congestive heart failure (HCC) 06/13/2012    Echo in 2011 with grade 1 diastolic dysfunction and normal systolic function with EF 60%.     . Portal hypertension (HCC) 01/15/2010    Qualifier: Diagnosis of  By: Everardo All MD, Sean A   . UNSPECIFIED PERIPHERAL VASCULAR DISEASE 01/15/2010    Qualifier: Diagnosis of  By: Everardo All MD, Cleophas Dunker     Past Surgical History  Procedure Laterality Date  . Laparoscopic cholecystectomy    . Inguinal hernia repair Left 11-30-2001  . Colonoscopy with esophagogastroduodenoscopy (egd)  09-21-2002  . Transthoracic echocardiogram  12-20-2009    severe LVH/  ef 60%/  grade I diastolic dysfunction/  AV sclerosis without stenosis/  mild LAE and RAE/    . Toe amputation    . Incision and drainage of wound Right 01/10/2014    Procedure: IRRIGATION AND DEBRIDEMENT RIGHT ANKLE WOUND WITH PLACEMENT OF ACELL;  Surgeon: Wayland Denis, DO;  Location: [facility];  Service: Plastics;  Laterality: Right;    Patient Care Team: Kirt Boys, DO as PCP - General (Internal Medicine) Sharee Holster, NP as Nurse Practitioner (Nurse Practitioner)  Social History   Social History  . Marital Status: Married    Spouse Name: N/A  . Number of Children: N/A  . Years of Education: N/A  Occupational History  .      Retired   Social History Main Topics  . Smoking status: Former Games developer  . Smokeless tobacco: Not on file  . Alcohol Use: No     Comment: hx alcohol abuse  in remission  . Drug Use: Not on file  . Sexual Activity: Not on file   Other Topics Concern  . Not on file   Social History Narrative   Lives at [facility]     reports that he has quit smoking. He does not have any smokeless tobacco history on file. He reports that he does not drink alcohol. His drug history is not on file.  Immunization History  Administered Date(s) Administered  . Influenza Split 12/06/2011  . Influenza Whole 12/24/2012  .  Influenza-Unspecified 12/15/2013  . PPD Test 03/29/2009  . Pneumococcal Conjugate-13 08/21/2005    No Known Allergies  Medications: Patient's Medications  New Prescriptions   No medications on file  Previous Medications   AMLODIPINE (NORVASC) 5 MG TABLET    Take 10 mg by mouth every morning.    ASPIRIN 81 MG TABLET    Take 81 mg by mouth every morning.    BUPROPION (WELLBUTRIN) 75 MG TABLET    Take 75 mg by mouth 2 (two) times daily.    DICLOFENAC SODIUM (VOLTAREN) 1 % GEL    Apply 2 g topically 3 (three) times daily. To right wrist   DORZOLAMIDE-TIMOLOL (COSOPT) 22.3-6.8 MG/ML OPHTHALMIC SOLUTION    Place 1 drop into both eyes 2 (two) times daily.   FENOFIBRATE (TRICOR) 145 MG TABLET    Take 145 mg by mouth every evening.    FUROSEMIDE (LASIX) 40 MG TABLET    Take 40 mg by mouth 2 (two) times daily.    GABAPENTIN (NEURONTIN) 100 MG CAPSULE    Take 100 mg by mouth 2 (two) times daily.   INSULIN ASPART (NOVOLOG) 100 UNIT/ML INJECTION    Inject 8 Units into the skin 3 (three) times daily before meals. for cbg >=150   INSULIN GLARGINE (LANTUS) 100 UNIT/ML INJECTION    Inject 28 Units into the skin at bedtime.    ISOSORBIDE MONONITRATE (IMDUR) 60 MG 24 HR TABLET    Take 60 mg by mouth every morning.    LATANOPROST (XALATAN) 0.005 % OPHTHALMIC SOLUTION    Place 1 drop into both eyes at bedtime.     LIDOCAINE (LIDODERM) 5 %    Place 1 patch onto the skin every 12 (twelve) hours. Remove & Discard patch within 12 hours to right neck   LISINOPRIL (PRINIVIL,ZESTRIL) 5 MG TABLET    Take 10 mg by mouth daily.    LORAZEPAM (ATIVAN) 0.5 MG TABLET    Take 1/2 tablet by mouth once daily for anxiety   MAGNESIUM HYDROXIDE (MILK OF MAGNESIA) 400 MG/5ML SUSPENSION    Take 60 mLs by mouth daily as needed.    MEMANTINE HCL ER (NAMENDA XR) 14 MG CP24    Take 1 tablet by mouth every morning.   METOPROLOL (LOPRESSOR) 50 MG TABLET    Take 50 mg by mouth 2 (two) times daily.   MULTIPLE VITAMINS-MINERALS  (DECUBI-VITE) CAPS    Take 1 capsule by mouth 2 (two) times daily.    OMEPRAZOLE (PRILOSEC) 20 MG CAPSULE    Take 20 mg by mouth every morning.    OXYCODONE (OXY-IR) 5 MG CAPSULE    Take one tablet by mouth every 8 hours as needed for pain.  OXYCODONE HCL ER (OXYCONTIN) 30 MG T12A    Take one tablet by mouth every 12 hours for pain NO NOT CUT/CRUSH/CHEW   POLYETHYLENE GLYCOL POWDER (GLYCOLAX/MIRALAX) POWDER    Take 17 g by mouth 2 (two) times daily.    POTASSIUM CHLORIDE (K-DUR,KLOR-CON) 10 MEQ TABLET    Take 10 mEq by mouth 2 (two) times daily.    SENNA (SENOKOT) 8.6 MG TABLET    Take 2 tablets by mouth 2 (two) times daily.   Modified Medications   No medications on file  Discontinued Medications   No medications on file    Review of Systems  Unable to perform ROS: Dementia    Filed Vitals:   12/27/14 1404  BP: 130/68  Pulse: 64  Temp: 98 F (36.7 C)  Weight: 238 lb 3.2 oz (108.047 kg)   Body mass index is 34.18 kg/(m^2).  Physical Exam  Constitutional: He appears well-developed. No distress.  Frail appearing lying in bed in NAD  HENT:  Mouth/Throat: Oropharynx is clear and moist.  Eyes: Pupils are equal, round, and reactive to light. No scleral icterus.  Neck: Neck supple. Carotid bruit is not present.  Cardiovascular: Normal rate, regular rhythm and intact distal pulses.  Exam reveals no gallop and no friction rub.   Murmur (1/6 SEM) heard. no distal LE swelling. No calf TTP  Pulmonary/Chest: Effort normal and breath sounds normal. He has no wheezes. He has no rales. He exhibits no tenderness.  Abdominal: Soft. Bowel sounds are normal. He exhibits no distension, no abdominal bruit, no pulsatile midline mass and no mass. There is tenderness (epigastric). There is no rebound and no guarding.  Musculoskeletal: He exhibits edema and tenderness.  Lymphadenopathy:    He has no cervical adenopathy.  Neurological: He is alert.  Skin: Skin is warm and dry. No rash noted.    Psychiatric: He has a normal mood and affect. His behavior is normal.     Labs reviewed: No visits with results within 3 Month(s) from this visit. Latest known visit with results is: 09-12-14: glucose 70; bun 24; creat 1.58; k+ 3.4; na++140 Nursing Home on 07/05/2014  Component Date Value Ref Range Status  . Creatinine 06/06/2014 1.6* 0.6 - 1.3 mg/dL Final  . Hgb [id number] MFr Bld 06/06/2014 6.4* 4.0 - 6.0 % Final    No results found.   Assessment/Plan   ICD-9-CM ICD-10-CM   1. Epigastric pain - probably due to #2 +/- #10 789.06 R10.13   2. Gastroesophageal reflux disease, esophagitis presence not specified - uncontrolled 530.81 K21.9   3. Vascular dementia, without behavioral disturbance - stable 290.40 F01.50   4. Chronic diastolic congestive heart failure (HCC) -stable 428.32 I50.32    428.0    5. Type 2 diabetes mellitus with neurological manifestations, controlled (HCC) - stable 250.60 E11.49   6. Chronic pain - stable 338.29 G89.29   7. Essential hypertension - stable 401.9 I10   8. Depression with anxiety - stable 300.4 F41.8   9. Dyslipidemia - stable 272.4 E78.5   10. Constipation due to pain medication - stable 564.09 K59.03    E947.9      Increase omeprazole 40mg  po daily   Cont other meds as ordered  Check A1c if not drawn already  Wound care as ordered with pro pass  CBG BID as ordered  Check BP BID as ordered  PT/OT/ST as indicated  Will follow  Jashun Puertas S. Hurshel Keysarter, D. O., F. A. C. O. I.  [facility] [place], [place] [address] [phone] Cell (Monday-Friday 8 AM - 5 PM) [phone] After 5 PM and follow prompts

## 2015-01-03 ENCOUNTER — Encounter: Payer: Self-pay | Admitting: Adult Health

## 2015-01-03 NOTE — Progress Notes (Signed)
[name], male   DOB: 17-Dec-1929, 81 y.o.   MRN: [id number]    Facility: [facility]      No Known Allergies  Chief Complaint  Patient presents with  . Acute Visit    hypertension     HPI:  He is a long term resident of this facility being seen for his uncontrolled hypertension. His reading prior to his morning medications is 210/10 and after his medications is 184/80. He denies any visual changes; no chest pain or shortness of breath; or weakness present.    Past Medical History  Diagnosis Date  . 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 Hypertension   . Peripheral vascular disease (HCC)   . Depression   . Anxiety   . GERD (gastroesophageal reflux disease)   . Renal insufficiency   . Dyslipidemia   . DDD (degenerative disc disease), lumbosacral   . History of colon polyps   . History of MRSA infection     Hx chronic diabetic ulcer's secondary MRSA  . Ulcer of right ankle (HCC)   . Insulin dependent type 2 diabetes mellitus (HCC)   . History of diabetic ulcer of foot   . Diabetic peripheral neuropathy (HCC)   . Wheelchair dependent   . At high risk for falls   . Chronic diastolic congestive heart failure (HCC) 06/13/2012    Echo in 2011 with grade 1 diastolic dysfunction and normal systolic function with EF 60%.     . Portal hypertension (HCC) 01/15/2010    Qualifier: Diagnosis of  By: Everardo All MD, Sean A   . UNSPECIFIED PERIPHERAL VASCULAR DISEASE 01/15/2010    Qualifier: Diagnosis of  By: Everardo All MD, Cleophas Dunker     Past Surgical History  Procedure Laterality Date  . Laparoscopic cholecystectomy    . Inguinal hernia repair Left 11-30-2001  . Colonoscopy with esophagogastroduodenoscopy (egd)  09-21-2002  . Transthoracic echocardiogram  12-20-2009    severe LVH/  ef 60%/  grade I diastolic dysfunction/  AV sclerosis without stenosis/  mild LAE and RAE/    . Toe amputation    . Incision and drainage of wound Right 01/10/2014    Procedure:  IRRIGATION AND DEBRIDEMENT RIGHT ANKLE WOUND WITH PLACEMENT OF ACELL;  Surgeon: Wayland Denis, DO;  Location: [facility];  Service: Plastics;  Laterality: Right;    VITAL SIGNS BP 184/80 mmHg  Pulse 62  Ht  (1.778 m)  Wt 245 lb 9.6 oz (111.403 kg)  BMI 35.24 kg/m2  Patient's Medications  New Prescriptions   No medications on file  Previous Medications   AMLODIPINE (NORVASC) 5 MG TABLET    Take 10 mg by mouth every morning.    ASPIRIN 81 MG TABLET    Take 81 mg by mouth every morning.    BUPROPION (WELLBUTRIN) 75 MG TABLET    Take 75 mg by mouth 2 (two) times daily.    DICLOFENAC SODIUM (VOLTAREN) 1 % GEL    Apply 2 g topically 3 (three) times daily. To right wrist   DORZOLAMIDE-TIMOLOL (COSOPT) 22.3-6.8 MG/ML OPHTHALMIC SOLUTION    Place 1 drop into both eyes 2 (two) times daily.   FENOFIBRATE (TRICOR) 145 MG TABLET    Take 145 mg by mouth every evening.    FUROSEMIDE (LASIX) 40 MG TABLET    Take 40 mg by mouth 2 (two) times daily.    GABAPENTIN (NEURONTIN) 100 MG CAPSULE    Take 100 mg by mouth 2 (  two) times daily.   INSULIN ASPART (NOVOLOG) 100 UNIT/ML INJECTION    Inject 8 Units into the skin 3 (three) times daily before meals. for cbg >=150   INSULIN GLARGINE (LANTUS) 100 UNIT/ML INJECTION    Inject 28 Units into the skin at bedtime.    ISOSORBIDE MONONITRATE (IMDUR) 60 MG 24 HR TABLET    Take 60 mg by mouth every morning.    LATANOPROST (XALATAN) 0.005 % OPHTHALMIC SOLUTION    Place 1 drop into both eyes at bedtime.     LIDOCAINE (LIDODERM) 5 %    Place 1 patch onto the skin every 12 (twelve) hours. Remove & Discard patch within 12 hours to right neck   LISINOPRIL (PRINIVIL,ZESTRIL) 5 MG TABLET    Take 10 mg by mouth daily.    LORAZEPAM (ATIVAN) 0.5 MG TABLET    Take 1/2 tablet by mouth once daily for anxiety   MAGNESIUM HYDROXIDE (MILK OF MAGNESIA) 400 MG/5ML SUSPENSION    Take 60 mLs by mouth daily as needed.    MEMANTINE HCL ER (NAMENDA XR) 14 MG CP24     Take 1 tablet by mouth every morning.   METOPROLOL (LOPRESSOR) 50 MG TABLET    Take 50 mg by mouth 2 (two) times daily.   MULTIPLE VITAMINS-MINERALS (DECUBI-VITE) CAPS    Take 1 capsule by mouth 2 (two) times daily.    OMEPRAZOLE (PRILOSEC) 20 MG CAPSULE    Take 20 mg by mouth every morning.    OXYCODONE (OXY-IR) 5 MG CAPSULE    Take one tablet by mouth every 8 hours as needed for pain.   OXYCODONE HCL ER (OXYCONTIN) 30 MG T12A    Take one tablet by mouth every 12 hours for pain NO NOT CUT/CRUSH/CHEW   POLYETHYLENE GLYCOL POWDER (GLYCOLAX/MIRALAX) POWDER    Take 17 g by mouth 2 (two) times daily.    POTASSIUM CHLORIDE (K-DUR,KLOR-CON) 10 MEQ TABLET    Take 10 mEq by mouth 2 (two) times daily.    SENNA (SENOKOT) 8.6 MG TABLET    Take 2 tablets by mouth 2 (two) times daily.   Modified Medications   No medications on file  Discontinued Medications   No medications on file     SIGNIFICANT DIAGNOSTIC EXAMS   03-14-14: right ankle tib/fib mri: 1. 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 2. T2 hyperintense probable draining abscess deep to ulceration. 3. Ankle effusion. This is probably reactive. Septic arthritis is less likely. 4. Infectious tenosynovitis of the peroneal tendons. 5. No abscess in the proximal RIGHT leg.  06-08-14: right hand x-ray; mild osteoarthritis  06-08-14: right wrist x-ray; osteoarthritis right wrist  06-09-14: pelvic and left hip x-ray: mild degenerative disease left hip no significant change since 09-28-13.    LABS REVIEWED:   11-15-13: urine micro-albumin 17.73 12-03-13: glucose 174; bun 27; creat 1.71; k+3.8; na++140 hgb a1c 6.9 02-17-14: right ankle wound culture: mrsa: zyvox  02-28-14: wbc 8.8; hgb 8.5; hct 25.5; mcv 87.1; plt 158  05-02-14: glucose 194; bun 29; creat 1.81; k+3.6; na++138  06-06-14: glucose 140; bun 23; creat 1.63; k+3.5; na++140; hgb  a1c  6.4  09-12-14: glucose 70; bun 24; creat 1.58; k+ 3.4;  na++140  09-19-14: wbc 9.9; hgb 12.9; hct 37.8; mcv 90.4; plt 141; glucose 113; bun 28; creat 1.95; k+ 3.8; na++141; liver normal albumin 4.0; chol 157; ldl 91; trig 200; hdl 26; hgb a1c 6.1     Review of Systems Constitutional: Negative for  appetite change and fatigue.  HENT: Negative for congestion.   Respiratory: Negative for cough, chest tightness and shortness of breath.   Cardiovascular: Negative for chest pain, palpitations and leg swelling.  Gastrointestinal: Negative for nausea, abdominal pain, diarrhea and constipation.  Musculoskeletal: Negative for myalgias and arthralgias.  Skin: Negative for pallor.       Chronic right ankle ulcer   Neurological: Negative for dizziness.  Psychiatric/Behavioral: The patient is not nervous/anxious.      Physical Exam Constitutional: No distress.  Overweight   Eyes: Conjunctivae are normal.  Neck: Neck supple. No JVD present. No thyromegaly present.  Cardiovascular: Normal rate, regular rhythm and intact distal pulses.   Respiratory: Effort normal and breath sounds normal. No respiratory distress. He has no wheezes.  GI: Soft. Bowel sounds are normal. He exhibits no distension. There is no tenderness.  Musculoskeletal: He exhibits no edema.  Able to move all extremities   Lymphadenopathy:    He has no cervical adenopathy.  Neurological: He is alert.  Skin: Skin is warm and dry. He is not diaphoretic.  Chronic wound treated at wound clinic   Psychiatric: He has a normal mood and affect.     ASSESSMENT/ PLAN:  1. Hypertension: will continue lopressor 50 mg twice daily; norvasc 10 mg daily asa 81 mg daily; will continue lisinopril  10 mg daily his creat is 1.95 so I will not increase the lisinopril. Will begin clonidine 0.1 mg twice daily and will have nursing check b/p twice daily         Synthia Innocenteborah Green NP [facility]  Contact [phone] Monday through Friday 8am- 5pm  After hours call [phone]

## 2015-01-11 ENCOUNTER — Encounter: Payer: Self-pay | Admitting: Adult Health

## 2015-01-11 NOTE — Progress Notes (Signed)
[name], male   DOB: [birth year], 81 y.o.   MRN: [id number]    Facility: [facility]      No Known Allergies  Chief Complaint  Patient presents with  . Medical Management of Chronic Issues    HPI:  He is a long term resident of this facility being seen for the management of his chronic illnesses. Overall his status is stable. He is not voicing any complaints or concerns; stating that he is doing well. There are no nursing concerns at this time.    Past Medical History  Diagnosis Date  . 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 Hypertension   . Peripheral vascular disease (HCC)   . Depression   . Anxiety   . GERD (gastroesophageal reflux disease)   . Renal insufficiency   . Dyslipidemia   . DDD (degenerative disc disease), lumbosacral   . History of colon polyps   . History of MRSA infection     Hx chronic diabetic ulcer's secondary MRSA  . Ulcer of right ankle (HCC)   . Insulin dependent type 2 diabetes mellitus (HCC)   . History of diabetic ulcer of foot   . Diabetic peripheral neuropathy (HCC)   . Wheelchair dependent   . At high risk for falls   . Chronic diastolic congestive heart failure (HCC) 06/13/2012    Echo in 2011 with grade 1 diastolic dysfunction and normal systolic function with EF 60%.     . Portal hypertension (HCC) 01/15/2010    Qualifier: Diagnosis of  By: Everardo All MD, Sean A   . UNSPECIFIED PERIPHERAL VASCULAR DISEASE 01/15/2010    Qualifier: Diagnosis of  By: Everardo All MD, Cleophas Dunker     Past Surgical History  Procedure Laterality Date  . Laparoscopic cholecystectomy    . Inguinal hernia repair Left 11-30-2001  . Colonoscopy with esophagogastroduodenoscopy (egd)  09-21-2002  . Transthoracic echocardiogram  12-20-2009    severe LVH/  ef 60%/  grade I diastolic dysfunction/  AV sclerosis without stenosis/  mild LAE and RAE/    . Toe amputation    . Incision and drainage of wound Right 01/10/2014    Procedure: IRRIGATION  AND DEBRIDEMENT RIGHT ANKLE WOUND WITH PLACEMENT OF ACELL;  Surgeon: Wayland Denis, DO;  Location: [facility];  Service: Plastics;  Laterality: Right;    VITAL SIGNS BP 137/71 mmHg  Pulse 88  Ht  (1.778 m)  Wt 234 lb (106.142 kg)  BMI 33.58 kg/m2  Patient's Medications  New Prescriptions   No medications on file  Previous Medications   AMLODIPINE (NORVASC) 5 MG TABLET    Take 10 mg by mouth every morning.    ASPIRIN 81 MG TABLET    Take 81 mg by mouth every morning.    BUPROPION (WELLBUTRIN) 75 MG TABLET    Take 75 mg by mouth 2 (two) times daily.    CLONIDINE (CATAPRES) 0.1 MG TABLET    Take 0.1-0.2 mg by mouth 2 (two) times daily. Give 0.2 mg in the AM and 0.1 in the PM   DICLOFENAC SODIUM (VOLTAREN) 1 % GEL    Apply 2 g topically 3 (three) times daily. To right wrist   DORZOLAMIDE-TIMOLOL (COSOPT) 22.3-6.8 MG/ML OPHTHALMIC SOLUTION    Place 1 drop into both eyes 2 (two) times daily.   FENOFIBRATE (TRICOR) 145 MG TABLET    Take 145 mg by mouth every evening.    FUROSEMIDE (LASIX) 40 MG TABLET  Take 40 mg by mouth 2 (two) times daily.    GABAPENTIN (NEURONTIN) 100 MG CAPSULE    Take 100 mg by mouth 2 (two) times daily.   INSULIN ASPART (NOVOLOG) 100 UNIT/ML INJECTION    Inject 8 Units into the skin 3 (three) times daily before meals. for cbg >=150   INSULIN GLARGINE (LANTUS) 100 UNIT/ML INJECTION    Inject 28 Units into the skin at bedtime.    ISOSORBIDE MONONITRATE (IMDUR) 60 MG 24 HR TABLET    Take 60 mg by mouth every morning.    LATANOPROST (XALATAN) 0.005 % OPHTHALMIC SOLUTION    Place 1 drop into both eyes at bedtime.     LIDOCAINE (LIDODERM) 5 %    Place 1 patch onto the skin every 12 (twelve) hours. Remove & Discard patch within 12 hours to right neck   LISINOPRIL (PRINIVIL,ZESTRIL) 5 MG TABLET    Take 10 mg by mouth daily.    LORAZEPAM (ATIVAN) 0.5 MG TABLET    Take 1/2 tablet by mouth once daily for anxiety   MAGNESIUM HYDROXIDE (MILK OF MAGNESIA) 400  MG/5ML SUSPENSION    Take 60 mLs by mouth daily as needed.    MEMANTINE HCL ER (NAMENDA XR) 14 MG CP24    Take 1 tablet by mouth every morning.   METOPROLOL (LOPRESSOR) 50 MG TABLET    Take 50 mg by mouth 2 (two) times daily.   MULTIPLE VITAMINS-MINERALS (DECUBI-VITE) CAPS    Take 1 capsule by mouth 2 (two) times daily.    OMEPRAZOLE (PRILOSEC) 20 MG CAPSULE    Take 20 mg by mouth every morning.    OXYCODONE (OXY-IR) 5 MG CAPSULE    Take one tablet by mouth every 8 hours as needed for pain.   OXYCODONE HCL ER (OXYCONTIN) 30 MG T12A    Take one tablet by mouth every 12 hours for pain NO NOT CUT/CRUSH/CHEW   POLYETHYLENE GLYCOL POWDER (GLYCOLAX/MIRALAX) POWDER    Take 17 g by mouth 2 (two) times daily.    POTASSIUM CHLORIDE (K-DUR,KLOR-CON) 10 MEQ TABLET    Take 10 mEq by mouth 2 (two) times daily.    SENNA (SENOKOT) 8.6 MG TABLET    Take 2 tablets by mouth 2 (two) times daily.   Modified Medications   No medications on file  Discontinued Medications   No medications on file     SIGNIFICANT DIAGNOSTIC EXAMS   03-14-14: right ankle tib/fib mri: 1. 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 2. T2 hyperintense probable draining abscess deep to ulceration. 3. Ankle effusion. This is probably reactive. Septic arthritis is less likely. 4. Infectious tenosynovitis of the peroneal tendons. 5. No abscess in the proximal RIGHT leg.  06-08-14: right hand x-ray; mild osteoarthritis  06-08-14: right wrist x-ray; osteoarthritis right wrist  06-09-14: pelvic and left hip x-ray: mild degenerative disease left hip no significant change since 09-28-13.    LABS REVIEWED:   11-15-13: urine micro-albumin 17.73 12-03-13: glucose 174; bun 27; creat 1.71; k+3.8; na++140 hgb a1c 6.9 02-17-14: right ankle wound culture: mrsa: zyvox  02-28-14: wbc 8.8; hgb 8.5; hct 25.5; mcv 87.1; plt 158  05-02-14: glucose 194; bun 29; creat 1.81; k+3.6; na++138  06-06-14: glucose  140; bun 23; creat 1.63; k+3.5; na++140; hgb  a1c  6.4  09-12-14: glucose 70; bun 24; creat 1.58; k+ 3.4; na++140  09-19-14: wbc 9.9; hgb 12.9; hct 37.8; mcv 90.4; plt 141; glucose 113; bun 28; creat 1.95; k+ 3.8;  na++141; liver normal albumin 4.0; chol 157; ldl 91; trig 200; hdl 26; hgb a1c 6.1     Review of Systems Constitutional: Negative for appetite change and fatigue.  HENT: Negative for congestion.   Respiratory: Negative for cough, chest tightness and shortness of breath.   Cardiovascular: Negative for chest pain, palpitations and leg swelling.  Gastrointestinal: Negative for nausea, abdominal pain, diarrhea and constipation.  Musculoskeletal: Negative for myalgias and arthralgias.  Skin: Negative for pallor.    Neurological: Negative for dizziness.  Psychiatric/Behavioral: The patient is not nervous/anxious.       Physical Exam Constitutional: No distress.  Overweight   Eyes: Conjunctivae are normal.  Neck: Neck supple. No JVD present. No thyromegaly present.  Cardiovascular: Normal rate, regular rhythm and intact distal pulses.   Respiratory: Effort normal and breath sounds normal. No respiratory distress. He has no wheezes.  GI: Soft. Bowel sounds are normal. He exhibits no distension. There is no tenderness.  Musculoskeletal: He exhibits no edema.  Able to move all extremities   Lymphadenopathy:    He has no cervical adenopathy.  Neurological: He is alert.  Skin: Skin is warm and dry. He is not diaphoretic.   Psychiatric: He has a normal mood and affect.      ASSESSMENT/ PLAN:  1. Hypertension: will continue lopressor 50 mg twice daily; norvasc 10 mg daily asa 81 mg daily; will continue  lisinopril  10 mg daily and clonidine 0.2 in the AM and 0.1 mg in the PM  will monitor his status.   2. Chronic diastolic heart failure: will continue lasix 40 mg twice daily lopressor 50 mg twice daily imdur 60 mg daily   3. Constipation: will continue miralax twice daily;  senna 2 tabs twice daily   4. Hypokalemia will continue  k+ 10 meq twice daily k+ is 3.8   5. GERD: will continue prilosec 20 mg daily   6. Dyslipidemia: will continue tricor 145 mg daily; will begin lipitor 10 mg daily; ldl is 91; trig is 200   7. Diabetes: will continue lantus 28 units daily; novolog 8 units prior to meals for cbg >=150 hgb a1c is 6.1   8. Depression with anxiety will continue wellbutrin sr 75 mg twice daily; ativan 0.25 mg daily  for anxiety and will monitor  9. Vascular dementia: is without change; will continue namenda xr 14 mg daily and will monitor   10. Chronic pain: his pain is presently being managed will continue oxycontin 30 mg twice daily; neurontin 100 mg twice daily; lidoderm patch to his right neck daily; voltaren gel 2 gm to right wrist three times daily  and oxycodone 5 mg every 8 hours as needed   12. Glaucoma; will continue latanoprost to both eyes nightly and cosopt to both eyes twice daily .   13. Anemia: his hgb is 12.9; will monitor      Synthia Innocenteborah Kinsley Nicklaus NP [facility]  Contact [phone] Monday through Friday 8am- 5pm  After hours call [phone]

## 2015-01-31 ENCOUNTER — Non-Acute Institutional Stay (SKILLED_NURSING_FACILITY): Payer: Medicare Other | Admitting: Adult Health

## 2015-01-31 ENCOUNTER — Encounter: Payer: Self-pay | Admitting: Adult Health

## 2015-01-31 DIAGNOSIS — K5903 Drug induced constipation: Secondary | ICD-10-CM

## 2015-01-31 DIAGNOSIS — E785 Hyperlipidemia, unspecified: Secondary | ICD-10-CM | POA: Diagnosis not present

## 2015-01-31 DIAGNOSIS — K219 Gastro-esophageal reflux disease without esophagitis: Secondary | ICD-10-CM | POA: Diagnosis not present

## 2015-01-31 DIAGNOSIS — D649 Anemia, unspecified: Secondary | ICD-10-CM

## 2015-01-31 DIAGNOSIS — F0150 Vascular dementia without behavioral disturbance: Secondary | ICD-10-CM

## 2015-01-31 DIAGNOSIS — E1149 Type 2 diabetes mellitus with other diabetic neurological complication: Secondary | ICD-10-CM

## 2015-01-31 DIAGNOSIS — I119 Hypertensive heart disease without heart failure: Secondary | ICD-10-CM | POA: Diagnosis not present

## 2015-01-31 DIAGNOSIS — G8929 Other chronic pain: Secondary | ICD-10-CM

## 2015-01-31 DIAGNOSIS — F418 Other specified anxiety disorders: Secondary | ICD-10-CM | POA: Diagnosis not present

## 2015-01-31 DIAGNOSIS — E1169 Type 2 diabetes mellitus with other specified complication: Secondary | ICD-10-CM | POA: Diagnosis not present

## 2015-01-31 DIAGNOSIS — I5032 Chronic diastolic (congestive) heart failure: Secondary | ICD-10-CM

## 2015-01-31 MED ORDER — ATORVASTATIN CALCIUM 10 MG PO TABS: 10.0000 mg | ORAL_TABLET | Freq: Every day | ORAL | Status: DC

## 2015-01-31 NOTE — Progress Notes (Signed)
[name], male   DOB: 1929/12/13, 81 y.o.   MRN: [id number]    Facility: [facility]      No Known Allergies  Chief Complaint  Patient presents with  . Medical Management of Chronic Issues    HPI:  He is a long term resident of this facility being seen for the management of his chronic illnesses. Overall there is little change in his status. He does have an abrasion on his right shin for which he is being treated by facility protocol. He is not voicing any complaints or concerns at this time there are no nursing concerns at this time.    Past Medical History  Diagnosis Date  . 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 Hypertension   . Peripheral vascular disease (HCC)   . Depression   . Anxiety   . GERD (gastroesophageal reflux disease)   . Renal insufficiency   . Dyslipidemia   . DDD (degenerative disc disease), lumbosacral   . History of colon polyps   . History of MRSA infection     Hx chronic diabetic ulcer's secondary MRSA  . Ulcer of right ankle (HCC)   . Insulin dependent type 2 diabetes mellitus (HCC)   . History of diabetic ulcer of foot   . Diabetic peripheral neuropathy (HCC)   . Wheelchair dependent   . At high risk for falls   . Chronic diastolic congestive heart failure (HCC) 06/13/2012    Echo in 2011 with grade 1 diastolic dysfunction and normal systolic function with EF 60%.     . Portal hypertension (HCC) 01/15/2010    Qualifier: Diagnosis of  By: Everardo All MD, Sean A   . UNSPECIFIED PERIPHERAL VASCULAR DISEASE 01/15/2010    Qualifier: Diagnosis of  By: Everardo All MD, Cleophas Dunker     Past Surgical History  Procedure Laterality Date  . Laparoscopic cholecystectomy    . Inguinal hernia repair Left 11-30-2001  . Colonoscopy with esophagogastroduodenoscopy (egd)  09-21-2002  . Transthoracic echocardiogram  12-20-2009    severe LVH/  ef 60%/  grade I diastolic dysfunction/  AV sclerosis without stenosis/  mild LAE and RAE/    . Toe amputation      . Incision and drainage of wound Right 01/10/2014    Procedure: IRRIGATION AND DEBRIDEMENT RIGHT ANKLE WOUND WITH PLACEMENT OF ACELL;  Surgeon: Wayland Denis, DO;  Location: [facility];  Service: Plastics;  Laterality: Right;    VITAL SIGNS BP 144/68 mmHg  Pulse 70  Ht  (1.778 m)  Wt 231 lb 9.6 oz (105.053 kg)  BMI 33.23 kg/m2  Patient's Medications  New Prescriptions   No medications on file  Previous Medications   AMLODIPINE (NORVASC) 5 MG TABLET    Take 10 mg by mouth every morning.    ASPIRIN 81 MG TABLET    Take 81 mg by mouth every morning.    BUPROPION (WELLBUTRIN) 75 MG TABLET    Take 75 mg by mouth 2 (two) times daily.    CLONIDINE (CATAPRES) 0.1 MG TABLET    Take 0.1-0.2 mg by mouth 2 (two) times daily. Give 0.2 mg in the AM and 0.1 in the PM   DICLOFENAC SODIUM (VOLTAREN) 1 % GEL    Apply 2 g topically 3 (three) times daily. To right wrist   DORZOLAMIDE-TIMOLOL (COSOPT) 22.3-6.8 MG/ML OPHTHALMIC SOLUTION    Place 1 drop into both eyes 2 (two) times daily.   FENOFIBRATE (TRICOR) 145 MG TABLET  Take 145 mg by mouth every evening.    FUROSEMIDE (LASIX) 40 MG TABLET    Take 40 mg by mouth 2 (two) times daily.    GABAPENTIN (NEURONTIN) 100 MG CAPSULE    Take 100 mg by mouth 2 (two) times daily.   INSULIN ASPART (NOVOLOG) 100 UNIT/ML INJECTION    Inject 8 Units into the skin 3 (three) times daily before meals. for cbg >=150   INSULIN GLARGINE (LANTUS) 100 UNIT/ML INJECTION    Inject 28 Units into the skin at bedtime.    ISOSORBIDE MONONITRATE (IMDUR) 60 MG 24 HR TABLET    Take 60 mg by mouth every morning.    LATANOPROST (XALATAN) 0.005 % OPHTHALMIC SOLUTION    Place 1 drop into both eyes at bedtime.     LIDOCAINE (LIDODERM) 5 %    Place 1 patch onto the skin every 12 (twelve) hours. Remove & Discard patch within 12 hours to right neck  And to left hip   LISINOPRIL (PRINIVIL,ZESTRIL) 5 MG TABLET    Take 10 mg by mouth daily.    LORAZEPAM (ATIVAN) 0.5 MG  TABLET    Take 0.5 mg by mouth daily. Prior to AM care   MAGNESIUM HYDROXIDE (MILK OF MAGNESIA) 400 MG/5ML SUSPENSION    Take 60 mLs by mouth daily as needed.    MEMANTINE HCL ER (NAMENDA XR) 14 MG CP24    Take 1 tablet by mouth every morning.   METOPROLOL (LOPRESSOR) 50 MG TABLET    Take 50 mg by mouth 2 (two) times daily.   OMEPRAZOLE (PRILOSEC) 20 MG CAPSULE    Take 20 mg by mouth every morning.    OXYCODONE (OXY-IR) 5 MG CAPSULE    Take one tablet by mouth every 8 hours as needed for pain.   OXYCODONE HCL ER (OXYCONTIN) 30 MG T12A    Take one tablet by mouth every 12 hours for pain NO NOT CUT/CRUSH/CHEW   POLYETHYLENE GLYCOL POWDER (GLYCOLAX/MIRALAX) POWDER    Take 17 g by mouth 2 (two) times daily.    POTASSIUM CHLORIDE (K-DUR,KLOR-CON) 10 MEQ TABLET    Take 20 mEq by mouth 2 (two) times daily.    PROTEIN SUPPLEMENT (PROMOD) POWD    Take 1 scoop by mouth 2 (two) times daily.   SENNA (SENOKOT) 8.6 MG TABLET    Take 2 tablets by mouth 2 (two) times daily.   Modified Medications   No medications on file  Discontinued Medications     SIGNIFICANT DIAGNOSTIC EXAMS  03-14-14: right ankle tib/fib mri: 1. 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 2. T2 hyperintense probable draining abscess deep to ulceration. 3. Ankle effusion. This is probably reactive. Septic arthritis is less likely. 4. Infectious tenosynovitis of the peroneal tendons. 5. No abscess in the proximal RIGHT leg.  06-08-14: right hand x-ray; mild osteoarthritis  06-08-14: right wrist x-ray; osteoarthritis right wrist  06-09-14: pelvic and left hip x-ray: mild degenerative disease left hip no significant change since 09-28-13.    LABS REVIEWED:   02-17-14: right ankle wound culture: mrsa: zyvox  02-28-14: wbc 8.8; hgb 8.5; hct 25.5; mcv 87.1; plt 158  05-02-14: glucose 194; bun 29; creat 1.81; k+3.6; na++138  06-06-14: glucose 140; bun 23; creat 1.63; k+3.5; na++140; hgb  a1c   6.4  09-12-14: glucose 70; bun 24; creat 1.58; k+ 3.4; na++140  09-19-14: wbc 9.9; hgb 12.9; hct 37.8; mcv 90.4; plt 141; glucose 113; bun 28; creat 1.95; k+ 3.8;  na++141; liver normal albumin 4.0; chol 157; ldl 91; trig 200; hdl 26; hgb a1c 6.1  12-09-14: glucose 159; bun 19; creat 1.44; k+ 3.4; na++139; hgb a1c 6.2 12-26-14: k+ 3.6     Review of Systems Constitutional: Negative for appetite change and fatigue.  HENT: Negative for congestion.   Respiratory: Negative for cough, chest tightness and shortness of breath.   Cardiovascular: Negative for chest pain, palpitations and leg swelling.  Gastrointestinal: Negative for nausea, abdominal pain, diarrhea and constipation.  Musculoskeletal: Negative for myalgias and arthralgias.  Skin: Negative for pallor.    Neurological: Negative for dizziness.  Psychiatric/Behavioral: The patient is not nervous/anxious.       Physical Exam Constitutional: No distress.  Overweight   Eyes: Conjunctivae are normal.  Neck: Neck supple. No JVD present. No thyromegaly present.  Cardiovascular: Normal rate, regular rhythm and intact distal pulses.   Respiratory: Effort normal and breath sounds normal. No respiratory distress. He has no wheezes.  GI: Soft. Bowel sounds are normal. He exhibits no distension. There is no tenderness.  Musculoskeletal: He exhibits no edema.  Able to move all extremities   Lymphadenopathy:    He has no cervical adenopathy.  Neurological: He is alert.  Skin: Skin is warm and dry. He is not diaphoretic.  Skin tear right shin no signs of infection present.   Psychiatric: He has a normal mood and affect.      ASSESSMENT/ PLAN:  1. Hypertension: will continue lopressor 50 mg twice daily; norvasc 10 mg daily asa 81 mg daily; will continue  lisinopril  10 mg daily and clonidine 0.2 in the AM and 0.1 mg in the PM  will monitor his status.   2. Chronic diastolic heart failure: will continue lasix 40 mg twice daily lopressor 50  mg twice daily imdur 60 mg daily   3. Constipation: will continue miralax twice daily; senna 2 tabs twice daily   4. Hypokalemia will continue  k+ 20 meq twice daily k+ is 3.6   5. GERD: will continue prilosec 20 mg daily   6. Dyslipidemia: will continue tricor 145 mg daily; will continue  lipitor 10 mg daily; ldl is 91; trig is 200   7. Diabetes: will continue lantus 28 units daily; novolog 8 units prior to meals for cbg >=150 hgb a1c is 6.2  8. Depression with anxiety will continue wellbutrin sr 75 mg twice daily; ativan 0.5 mg daily prior to AM care   for anxiety and will monitor  9. Vascular dementia: is without change; will continue namenda xr 14 mg daily and will monitor   10. Chronic pain: his pain is presently being managed will continue oxycontin 30 mg twice daily; neurontin 100 mg twice daily; lidoderm patch to his right neck daily and left hip; voltaren gel 2 gm to right wrist three times daily  and oxycodone 5 mg every 8 hours as needed   12. Glaucoma; will continue latanoprost to both eyes nightly and cosopt to both eyes twice daily .   13. Anemia: his hgb is 12.9; will monitor          Synthia Innocent NP [facility]  Contact [phone] Monday through Friday 8am- 5pm  After hours call [phone]

## 2015-02-06 ENCOUNTER — Non-Acute Institutional Stay (SKILLED_NURSING_FACILITY): Payer: Medicare Other | Admitting: Adult Health

## 2015-02-06 DIAGNOSIS — L03115 Cellulitis of right lower limb: Secondary | ICD-10-CM

## 2015-02-23 ENCOUNTER — Non-Acute Institutional Stay (SKILLED_NURSING_FACILITY): Payer: Medicare Other | Admitting: Adult Health

## 2015-02-23 DIAGNOSIS — I5032 Chronic diastolic (congestive) heart failure: Secondary | ICD-10-CM | POA: Diagnosis not present

## 2015-02-23 DIAGNOSIS — D649 Anemia, unspecified: Secondary | ICD-10-CM

## 2015-02-23 DIAGNOSIS — E785 Hyperlipidemia, unspecified: Secondary | ICD-10-CM | POA: Diagnosis not present

## 2015-02-23 DIAGNOSIS — E1149 Type 2 diabetes mellitus with other diabetic neurological complication: Secondary | ICD-10-CM | POA: Diagnosis not present

## 2015-02-23 DIAGNOSIS — K5903 Drug induced constipation: Secondary | ICD-10-CM | POA: Diagnosis not present

## 2015-02-23 DIAGNOSIS — E1169 Type 2 diabetes mellitus with other specified complication: Secondary | ICD-10-CM | POA: Diagnosis not present

## 2015-02-23 DIAGNOSIS — F0150 Vascular dementia without behavioral disturbance: Secondary | ICD-10-CM

## 2015-02-23 DIAGNOSIS — I119 Hypertensive heart disease without heart failure: Secondary | ICD-10-CM

## 2015-02-23 DIAGNOSIS — G8929 Other chronic pain: Secondary | ICD-10-CM

## 2015-02-23 DIAGNOSIS — K219 Gastro-esophageal reflux disease without esophagitis: Secondary | ICD-10-CM

## 2015-02-23 DIAGNOSIS — F418 Other specified anxiety disorders: Secondary | ICD-10-CM

## 2015-03-03 ENCOUNTER — Encounter: Payer: Self-pay | Admitting: Adult Health

## 2015-03-03 DIAGNOSIS — L03115 Cellulitis of right lower limb: Secondary | ICD-10-CM | POA: Insufficient documentation

## 2015-03-03 NOTE — Progress Notes (Signed)
[name], male   DOB: 1929-12-17, 81 y.o.   MRN: [id number]    [facility]      No Known Allergies  Chief Complaint  Patient presents with  . Acute Visit    right ankle wound    HPI:  The staff has asked me to see his right ankle wound. The area has reopened. There is slough and redness in to the wound bed which has nearly resolved. The surrounding area is red hot inflamed. There are no reports of fever present.    Past Medical History  Diagnosis Date  . 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 Hypertension   . Peripheral vascular disease (HCC)   . Depression   . Anxiety   . GERD (gastroesophageal reflux disease)   . Renal insufficiency   . Dyslipidemia   . DDD (degenerative disc disease), lumbosacral   . History of colon polyps   . History of MRSA infection     Hx chronic diabetic ulcer's secondary MRSA  . Ulcer of right ankle (HCC)   . Insulin dependent type 2 diabetes mellitus (HCC)   . History of diabetic ulcer of foot   . Diabetic peripheral neuropathy (HCC)   . Wheelchair dependent   . At high risk for falls   . Chronic diastolic congestive heart failure (HCC) 06/13/2012    Echo in 2011 with grade 1 diastolic dysfunction and normal systolic function with EF 60%.     . Portal hypertension (HCC) 01/15/2010    Qualifier: Diagnosis of  By: Everardo AllEllison MD, Sean A   . UNSPECIFIED PERIPHERAL VASCULAR DISEASE 01/15/2010    Qualifier: Diagnosis of  By: Everardo AllEllison MD, Cleophas DunkerSean A     Past Surgical History  Procedure Laterality Date  . Laparoscopic cholecystectomy    . Inguinal hernia repair Left 11-30-2001  . Colonoscopy with esophagogastroduodenoscopy (egd)  09-21-2002  . Transthoracic echocardiogram  12-20-2009    severe LVH/  ef 60%/  grade I diastolic dysfunction/  AV sclerosis without stenosis/  mild LAE and RAE/    . Toe amputation    . Incision and drainage of wound Right 01/10/2014    Procedure: IRRIGATION AND DEBRIDEMENT RIGHT ANKLE WOUND  WITH PLACEMENT OF ACELL;  Surgeon: Wayland Denislaire [name], DO;  Location: [facility];  Service: Plastics;  Laterality: Right;    VITAL SIGNS BP 126/61 mmHg  Pulse 60  Ht 5\' 10"  (1.778 m)  Wt 238 lb 9.6 oz (108.228 kg)  BMI 34.24 kg/m2  Patient's Medications  New Prescriptions   No medications on file  Previous Medications   AMLODIPINE (NORVASC) 5 MG TABLET    Take 10 mg by mouth every morning.    ASPIRIN 81 MG TABLET    Take 81 mg by mouth every morning.    ATORVASTATIN (LIPITOR) 10 MG TABLET    Take 1 tablet (10 mg total) by mouth daily.   BUPROPION (WELLBUTRIN) 75 MG TABLET    Take 75 mg by mouth 2 (two) times daily.    CLONIDINE (CATAPRES) 0.1 MG TABLET    Take 0.1-0.2 mg by mouth 2 (two) times daily. Give 0.2 mg in the AM and 0.1 in the PM   DICLOFENAC SODIUM (VOLTAREN) 1 % GEL    Apply 2 g topically 3 (three) times daily. To right wrist   DORZOLAMIDE-TIMOLOL (COSOPT) 22.3-6.8 MG/ML OPHTHALMIC SOLUTION    Place 1 drop into both eyes 2 (two) times daily.   FENOFIBRATE (TRICOR) 145 MG TABLET  Take 145 mg by mouth every evening.    FUROSEMIDE (LASIX) 40 MG TABLET    Take 40 mg by mouth 2 (two) times daily.    GABAPENTIN (NEURONTIN) 100 MG CAPSULE    Take 100 mg by mouth 2 (two) times daily.   INSULIN ASPART (NOVOLOG) 100 UNIT/ML INJECTION    Inject 8 Units into the skin 3 (three) times daily before meals. for cbg >=150   INSULIN GLARGINE (LANTUS) 100 UNIT/ML INJECTION    Inject 28 Units into the skin at bedtime.    ISOSORBIDE MONONITRATE (IMDUR) 60 MG 24 HR TABLET    Take 60 mg by mouth every morning.    LATANOPROST (XALATAN) 0.005 % OPHTHALMIC SOLUTION    Place 1 drop into both eyes at bedtime.     LIDOCAINE (LIDODERM) 5 %    Place 1 patch onto the skin every 12 (twelve) hours. Remove & Discard patch within 12 hours to right neck  And to left hip   LISINOPRIL (PRINIVIL,ZESTRIL) 5 MG TABLET    Take 10 mg by mouth daily.    LORAZEPAM (ATIVAN) 0.5 MG TABLET    Take 0.5 mg by  mouth daily. Prior to AM care   MAGNESIUM HYDROXIDE (MILK OF MAGNESIA) 400 MG/5ML SUSPENSION    Take 60 mLs by mouth daily as needed.    MEMANTINE HCL ER (NAMENDA XR) 14 MG CP24    Take 1 tablet by mouth every morning.   METOPROLOL (LOPRESSOR) 50 MG TABLET    Take 50 mg by mouth 2 (two) times daily.   OMEPRAZOLE (PRILOSEC) 20 MG CAPSULE    Take 20 mg by mouth every morning.    OXYCODONE (OXY-IR) 5 MG CAPSULE    Take one tablet by mouth every 8 hours as needed for pain.   OXYCODONE HCL ER (OXYCONTIN) 30 MG T12A    Take one tablet by mouth every 12 hours for pain NO NOT CUT/CRUSH/CHEW   POLYETHYLENE GLYCOL POWDER (GLYCOLAX/MIRALAX) POWDER    Take 17 g by mouth 2 (two) times daily.    POTASSIUM CHLORIDE (K-DUR,KLOR-CON) 10 MEQ TABLET    Take 20 mEq by mouth 2 (two) times daily.    PROTEIN SUPPLEMENT (PROMOD) POWD    Take 1 scoop by mouth 2 (two) times daily.   SENNA (SENOKOT) 8.6 MG TABLET    Take 2 tablets by mouth 2 (two) times daily.   Modified Medications   No medications on file  Discontinued Medications   No medications on file     SIGNIFICANT DIAGNOSTIC EXAMS   03-14-14: right ankle tib/fib mri: 1. 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 2. T2 hyperintense probable draining abscess deep to ulceration. 3. Ankle effusion. This is probably reactive. Septic arthritis is less likely. 4. Infectious tenosynovitis of the peroneal tendons. 5. No abscess in the proximal RIGHT leg.  06-08-14: right hand x-ray; mild osteoarthritis  06-08-14: right wrist x-ray; osteoarthritis right wrist  06-09-14: pelvic and left hip x-ray: mild degenerative disease left hip no significant change since 09-28-13.    LABS REVIEWED:   02-17-14: right ankle wound culture: mrsa: zyvox  02-28-14: wbc 8.8; hgb 8.5; hct 25.5; mcv 87.1; plt 158  05-02-14: glucose 194; bun 29; creat 1.81; k+3.6; na++138  06-06-14: glucose 140; bun 23; creat 1.63; k+3.5; na++140; hgb  a1c   6.4  09-12-14: glucose 70; bun 24; creat 1.58; k+ 3.4; na++140  09-19-14: wbc 9.9; hgb 12.9; hct 37.8; mcv 90.4; plt 141; glucose  113; bun 28; creat 1.95; k+ 3.8; na++141; liver normal albumin 4.0; chol 157; ldl 91; trig 200; hdl 26; hgb a1c 6.1  12-09-14: glucose 159; bun 19; creat 1.44; k+ 3.4; na++139; hgb a1c 6.2 12-26-14: k+ 3.6     Review of Systems Constitutional: Negative for appetite change and fatigue.  HENT: Negative for congestion.   Respiratory: Negative for cough, chest tightness and shortness of breath.   Cardiovascular: Negative for chest pain, palpitations and leg swelling.  Gastrointestinal: Negative for nausea, abdominal pain, diarrhea and constipation.  Musculoskeletal: Negative for myalgias and arthralgias.  Skin: right ankle inflamed.    Neurological: Negative for dizziness.  Psychiatric/Behavioral: The patient is not nervous/anxious.       Physical Exam Constitutional: No distress.  Overweight   Eyes: Conjunctivae are normal.  Neck: Neck supple. No JVD present. No thyromegaly present.  Cardiovascular: Normal rate, regular rhythm and intact distal pulses.   Respiratory: Effort normal and breath sounds normal. No respiratory distress. He has no wheezes.  GI: Soft. Bowel sounds are normal. He exhibits no distension. There is no tenderness.  Musculoskeletal: He exhibits no edema.  Able to move all extremities   Lymphadenopathy:    He has no cervical adenopathy.  Neurological: He is alert.  Skin: Skin is warm and dry. He is not diaphoretic.  Right ankle is red hot inflamed. The open area has nearly resolved.   Psychiatric: He has a normal mood and affect.       ASSESSMENT/ PLAN:  1. Right ankle cellulitis: will being cipro 500 mg twice daily for 3 weeks with florastor twice daily will continue wound treatment per facility protocol.      Synthia Innocent NP [facility]  Contact [phone] Monday through Friday 8am- 5pm  After hours call  [phone]

## 2015-03-04 ENCOUNTER — Encounter: Payer: Self-pay | Admitting: Adult Health

## 2015-03-04 NOTE — Progress Notes (Signed)
[name], male   DOB: 07-27-1929, 81 y.o.   MRN: [id number]    Facility: [facility]      No Known Allergies  Chief Complaint  Patient presents with  . Medical Management of Chronic Issues    HPI:  He is a long term resident of this facility being seen for the management of his chronic illnesses. Overall there is little change in his status. He tells me that he is feeling good and has no concerns or complaints at this time. There are no nursing concerns at this time.    Past Medical History  Diagnosis Date  . 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 Hypertension   . Peripheral vascular disease (HCC)   . Depression   . Anxiety   . GERD (gastroesophageal reflux disease)   . Renal insufficiency   . Dyslipidemia   . DDD (degenerative disc disease), lumbosacral   . History of colon polyps   . History of MRSA infection     Hx chronic diabetic ulcer's secondary MRSA  . Ulcer of right ankle (HCC)   . Insulin dependent type 2 diabetes mellitus (HCC)   . History of diabetic ulcer of foot   . Diabetic peripheral neuropathy (HCC)   . Wheelchair dependent   . At high risk for falls   . Chronic diastolic congestive heart failure (HCC) 06/13/2012    Echo in 2011 with grade 1 diastolic dysfunction and normal systolic function with EF 60%.     . Portal hypertension (HCC) 01/15/2010    Qualifier: Diagnosis of  By: Everardo All MD, Sean A   . UNSPECIFIED PERIPHERAL VASCULAR DISEASE 01/15/2010    Qualifier: Diagnosis of  By: Everardo All MD, Cleophas Dunker     Past Surgical History  Procedure Laterality Date  . Laparoscopic cholecystectomy    . Inguinal hernia repair Left 11-30-2001  . Colonoscopy with esophagogastroduodenoscopy (egd)  09-21-2002  . Transthoracic echocardiogram  12-20-2009    severe LVH/  ef 60%/  grade I diastolic dysfunction/  AV sclerosis without stenosis/  mild LAE and RAE/    . Toe amputation    . Incision and drainage of wound Right 01/10/2014    Procedure:  IRRIGATION AND DEBRIDEMENT RIGHT ANKLE WOUND WITH PLACEMENT OF ACELL;  Surgeon: Wayland Denis, DO;  Location: [facility];  Service: Plastics;  Laterality: Right;    VITAL SIGNS BP 137/63 mmHg  Pulse 62  Ht  (1.778 m)  Wt 230 lb (104.327 kg)  BMI 33.00 kg/m2  Patient's Medications  New Prescriptions   No medications on file  Previous Medications   AMLODIPINE (NORVASC) 5 MG TABLET    Take 10 mg by mouth every morning.    ASPIRIN 81 MG TABLET    Take 81 mg by mouth every morning.    ATORVASTATIN (LIPITOR) 10 MG TABLET    Take 1 tablet (10 mg total) by mouth daily.   BUPROPION (WELLBUTRIN) 75 MG TABLET    Take 75 mg by mouth 2 (two) times daily.    CLONIDINE (CATAPRES) 0.1 MG TABLET    Take 0.1-0.2 mg by mouth 2 (two) times daily. Give 0.2 mg in the AM and 0.1 in the PM   DICLOFENAC SODIUM (VOLTAREN) 1 % GEL    Apply 2 g topically 3 (three) times daily. To right wrist   DORZOLAMIDE-TIMOLOL (COSOPT) 22.3-6.8 MG/ML OPHTHALMIC SOLUTION    Place 1 drop into both eyes 2 (two) times daily.   FENOFIBRATE (TRICOR)  145 MG TABLET    Take 145 mg by mouth every evening.    FUROSEMIDE (LASIX) 40 MG TABLET    Take 40 mg by mouth 2 (two) times daily.    GABAPENTIN (NEURONTIN) 100 MG CAPSULE    Take 100 mg by mouth 2 (two) times daily.   INSULIN ASPART (NOVOLOG) 100 UNIT/ML INJECTION    Inject 8 Units into the skin 3 (three) times daily before meals. for cbg >=150   INSULIN GLARGINE (LANTUS) 100 UNIT/ML INJECTION    Inject 28 Units into the skin at bedtime.    ISOSORBIDE MONONITRATE (IMDUR) 60 MG 24 HR TABLET    Take 60 mg by mouth every morning.    LATANOPROST (XALATAN) 0.005 % OPHTHALMIC SOLUTION    Place 1 drop into both eyes at bedtime.     LIDOCAINE (LIDODERM) 5 %    Place 1 patch onto the skin every 12 (twelve) hours. Remove & Discard patch within 12 hours to right neck  And to left hip   LISINOPRIL (PRINIVIL,ZESTRIL) 5 MG TABLET    Take 10 mg by mouth daily.    LORAZEPAM  (ATIVAN) 0.5 MG TABLET    Take 0.5 mg by mouth daily. Prior to AM care   MAGNESIUM HYDROXIDE (MILK OF MAGNESIA) 400 MG/5ML SUSPENSION    Take 60 mLs by mouth daily as needed.    MEMANTINE HCL ER (NAMENDA XR) 14 MG CP24    Take 1 tablet by mouth every morning.   METOPROLOL (LOPRESSOR) 50 MG TABLET    Take 50 mg by mouth 2 (two) times daily.   OMEPRAZOLE (PRILOSEC) 20 MG CAPSULE    Take 20 mg by mouth every morning.    OXYCODONE (OXY-IR) 5 MG CAPSULE    Take one tablet by mouth every 8 hours as needed for pain.   OXYCODONE HCL ER (OXYCONTIN) 30 MG T12A    Take one tablet by mouth every 12 hours for pain NO NOT CUT/CRUSH/CHEW   POLYETHYLENE GLYCOL POWDER (GLYCOLAX/MIRALAX) POWDER    Take 17 g by mouth 2 (two) times daily.    POTASSIUM CHLORIDE (K-DUR,KLOR-CON) 10 MEQ TABLET    Take 20 mEq by mouth 2 (two) times daily.    PROTEIN SUPPLEMENT (PROMOD) POWD    Take 1 scoop by mouth 2 (two) times daily.   SENNA (SENOKOT) 8.6 MG TABLET    Take 2 tablets by mouth 2 (two) times daily.   Modified Medications   No medications on file  Discontinued Medications   No medications on file     SIGNIFICANT DIAGNOSTIC EXAMS  03-14-14: right ankle tib/fib mri: 1. 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 2. T2 hyperintense probable draining abscess deep to ulceration. 3. Ankle effusion. This is probably reactive. Septic arthritis is less likely. 4. Infectious tenosynovitis of the peroneal tendons. 5. No abscess in the proximal RIGHT leg.  06-08-14: right hand x-ray; mild osteoarthritis  06-08-14: right wrist x-ray; osteoarthritis right wrist  06-09-14: pelvic and left hip x-ray: mild degenerative disease left hip no significant change since 09-28-13.    LABS REVIEWED:   02-28-14: wbc 8.8; hgb 8.5; hct 25.5; mcv 87.1; plt 158  05-02-14: glucose 194; bun 29; creat 1.81; k+3.6; na++138  06-06-14: glucose 140; bun 23; creat 1.63; k+3.5; na++140; hgb  a1c  6.4    09-12-14: glucose 70; bun 24; creat 1.58; k+ 3.4; na++140  09-19-14: wbc 9.9; hgb 12.9; hct 37.8; mcv 90.4; plt 141; glucose 113; bun  28; creat 1.95; k+ 3.8; na++141; liver normal albumin 4.0; chol 157; ldl 91; trig 200; hdl 26; hgb a1c 6.1  12-09-14: glucose 159; bun 19; creat 1.44; k+ 3.4; na++139; hgb a1c 6.2 12-26-14: k+ 3.6     Review of Systems Constitutional: Negative for appetite change and fatigue.  HENT: Negative for congestion.   Respiratory: Negative for cough, chest tightness and shortness of breath.   Cardiovascular: Negative for chest pain, palpitations and leg swelling.  Gastrointestinal: Negative for nausea, abdominal pain, diarrhea and constipation.  Musculoskeletal: Negative for myalgias and arthralgias.  Skin: no complaints     Neurological: Negative for dizziness.  Psychiatric/Behavioral: The patient is not nervous/anxious.       Physical Exam Constitutional: No distress.  Overweight   Eyes: Conjunctivae are normal.  Neck: Neck supple. No JVD present. No thyromegaly present.  Cardiovascular: Normal rate, regular rhythm and intact distal pulses.   Respiratory: Effort normal and breath sounds normal. No respiratory distress. He has no wheezes.  GI: Soft. Bowel sounds are normal. He exhibits no distension. There is no tenderness.  Musculoskeletal: He exhibits no edema.  Able to move all extremities   Lymphadenopathy:    He has no cervical adenopathy.  Neurological: He is alert.  Skin: Skin is warm and dry. He is not diaphoretic.  Right ankle is without signs of infection   Psychiatric: He has a normal mood and affect.       ASSESSMENT/ PLAN:   1. Hypertension: will continue lopressor 50 mg twice daily; norvasc 10 mg daily asa 81 mg daily; will continue  lisinopril  10 mg daily and clonidine 0.2 in the AM and 0.1 mg in the PM  will monitor his status.   2. Chronic diastolic heart failure: will continue lasix 40 mg twice daily lopressor 50 mg twice daily  imdur 60 mg daily   3. Constipation: will continue miralax twice daily; senna 2 tabs twice daily   4. Hypokalemia will continue  k+ 20 meq twice daily k+ is 3.6   5. GERD: will continue prilosec 20 mg daily   6. Dyslipidemia: will continue tricor 145 mg daily; will continue  lipitor 10 mg daily; ldl is 91; trig is 200   7. Diabetes: will continue lantus 28 units daily; novolog 8 units prior to meals for cbg >=150 hgb a1c is 6.2  8. Depression with anxiety will continue wellbutrin sr 75 mg twice daily; ativan 0.5 mg daily prior to AM care   for anxiety and will monitor  9. Vascular dementia: is without change; will continue namenda xr 14 mg daily and will monitor   10. Chronic pain: his pain is presently being managed will continue oxycontin 30 mg twice daily; neurontin 100 mg twice daily; lidoderm patch to his right neck daily and left hip; voltaren gel 2 gm to right wrist three times daily  and oxycodone 5 mg every 8 hours as needed   12. Glaucoma; will continue latanoprost to both eyes nightly and cosopt to both eyes twice daily .   13. Anemia: his hgb is 12.9; will monitor      Synthia Innocent NP [facility]  Contact [phone] Monday through Friday 8am- 5pm  After hours call [phone]

## 2015-03-08 ENCOUNTER — Non-Acute Institutional Stay (SKILLED_NURSING_FACILITY): Payer: Medicare Other | Admitting: Adult Health

## 2015-03-08 DIAGNOSIS — L03115 Cellulitis of right lower limb: Secondary | ICD-10-CM

## 2015-03-08 DIAGNOSIS — L89512 Pressure ulcer of right ankle, stage 2: Secondary | ICD-10-CM | POA: Diagnosis not present

## 2015-03-19 ENCOUNTER — Encounter: Payer: Self-pay | Admitting: Adult Health

## 2015-03-19 DIAGNOSIS — L89512 Pressure ulcer of right ankle, stage 2: Secondary | ICD-10-CM | POA: Insufficient documentation

## 2015-03-19 NOTE — Progress Notes (Signed)
[name], male   DOB: 23-Aug-1929, 82 y.o.   MRN: [id number]   Facility: [facility]      No Known Allergies  Chief Complaint  Patient presents with  . Acute Visit    wound management     HPI:  I have been asked to see him for the management of his right lateral ankle pressure ulceration. The ulcer is cellulitic. He is not complaining of pain there are no reports of fever present.    Past Medical History  Diagnosis Date  . 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 Hypertension   . Peripheral vascular disease (HCC)   . Depression   . Anxiety   . GERD (gastroesophageal reflux disease)   . Renal insufficiency   . Dyslipidemia   . DDD (degenerative disc disease), lumbosacral   . History of colon polyps   . History of MRSA infection     Hx chronic diabetic ulcer's secondary MRSA  . Ulcer of right ankle (HCC)   . Insulin dependent type 2 diabetes mellitus (HCC)   . History of diabetic ulcer of foot   . Diabetic peripheral neuropathy (HCC)   . Wheelchair dependent   . At high risk for falls   . Chronic diastolic congestive heart failure (HCC) 06/13/2012    Echo in 2011 with grade 1 diastolic dysfunction and normal systolic function with EF 60%.     . Portal hypertension (HCC) 01/15/2010    Qualifier: Diagnosis of  By: Everardo All MD, Sean A   . UNSPECIFIED PERIPHERAL VASCULAR DISEASE 01/15/2010    Qualifier: Diagnosis of  By: Everardo All MD, Cleophas Dunker     Past Surgical History  Procedure Laterality Date  . Laparoscopic cholecystectomy    . Inguinal hernia repair Left 11-30-2001  . Colonoscopy with esophagogastroduodenoscopy (egd)  09-21-2002  . Transthoracic echocardiogram  12-20-2009    severe LVH/  ef 60%/  grade I diastolic dysfunction/  AV sclerosis without stenosis/  mild LAE and RAE/    . Toe amputation    . Incision and drainage of wound Right 01/10/2014    Procedure: IRRIGATION AND DEBRIDEMENT RIGHT ANKLE WOUND WITH PLACEMENT OF ACELL;  Surgeon: Wayland Denis, DO;  Location: [facility];  Service: Plastics;  Laterality: Right;    VITAL SIGNS BP 138/72 mmHg  Pulse 60  Ht 5\' 10"  (1.778 m)  Wt 239 lb (108.41 kg)  BMI 34.29 kg/m2  Patient's Medications  New Prescriptions   No medications on file  Previous Medications   AMLODIPINE (NORVASC) 5 MG TABLET    Take 10 mg by mouth every morning.    ASPIRIN 81 MG TABLET    Take 81 mg by mouth every morning.    ATORVASTATIN (LIPITOR) 10 MG TABLET    Take 1 tablet (10 mg total) by mouth daily.   BUPROPION (WELLBUTRIN) 75 MG TABLET    Take 75 mg by mouth 2 (two) times daily.    CLONIDINE (CATAPRES) 0.1 MG TABLET    Take 0.1-0.2 mg by mouth 2 (two) times daily. Give 0.2 mg in the AM and 0.1 in the PM   DICLOFENAC SODIUM (VOLTAREN) 1 % GEL    Apply 2 g topically 3 (three) times daily. To right wrist   DORZOLAMIDE-TIMOLOL (COSOPT) 22.3-6.8 MG/ML OPHTHALMIC SOLUTION    Place 1 drop into both eyes 2 (two) times daily.   FENOFIBRATE (TRICOR) 145 MG TABLET    Take 145 mg by mouth every evening.  FUROSEMIDE (LASIX) 40 MG TABLET    Take 40 mg by mouth 2 (two) times daily.    GABAPENTIN (NEURONTIN) 100 MG CAPSULE    Take 100 mg by mouth 2 (two) times daily.   INSULIN ASPART (NOVOLOG) 100 UNIT/ML INJECTION    Inject 8 Units into the skin 3 (three) times daily before meals. for cbg >=150   INSULIN GLARGINE (LANTUS) 100 UNIT/ML INJECTION    Inject 28 Units into the skin at bedtime.    ISOSORBIDE MONONITRATE (IMDUR) 60 MG 24 HR TABLET    Take 60 mg by mouth every morning.    LATANOPROST (XALATAN) 0.005 % OPHTHALMIC SOLUTION    Place 1 drop into both eyes at bedtime.     LIDOCAINE (LIDODERM) 5 %    Place 1 patch onto the skin every 12 (twelve) hours. Remove & Discard patch within 12 hours to right neck  And to left hip   LISINOPRIL (PRINIVIL,ZESTRIL) 5 MG TABLET    Take 10 mg by mouth daily.    LORAZEPAM (ATIVAN) 0.5 MG TABLET    Take 0.5 mg by mouth daily. Prior to AM care   MAGNESIUM HYDROXIDE  (MILK OF MAGNESIA) 400 MG/5ML SUSPENSION    Take 60 mLs by mouth daily as needed.    MEMANTINE HCL ER (NAMENDA XR) 14 MG CP24    Take 1 tablet by mouth every morning.   METOPROLOL (LOPRESSOR) 50 MG TABLET    Take 50 mg by mouth 2 (two) times daily.   OMEPRAZOLE (PRILOSEC) 20 MG CAPSULE    Take 20 mg by mouth every morning.    OXYCODONE (OXY-IR) 5 MG CAPSULE    Take one tablet by mouth every 8 hours as needed for pain.   OXYCODONE HCL ER (OXYCONTIN) 30 MG T12A    Take one tablet by mouth every 12 hours for pain NO NOT CUT/CRUSH/CHEW   POLYETHYLENE GLYCOL POWDER (GLYCOLAX/MIRALAX) POWDER    Take 17 g by mouth 2 (two) times daily.    POTASSIUM CHLORIDE (K-DUR,KLOR-CON) 10 MEQ TABLET    Take 20 mEq by mouth 2 (two) times daily.    PROTEIN SUPPLEMENT (PROMOD) POWD    Take 1 scoop by mouth 2 (two) times daily.   SENNA (SENOKOT) 8.6 MG TABLET    Take 2 tablets by mouth 2 (two) times daily.   Modified Medications   No medications on file  Discontinued Medications   No medications on file     SIGNIFICANT DIAGNOSTIC EXAMS   03-14-14: right ankle tib/fib mri: 1. 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 2. T2 hyperintense probable draining abscess deep to ulceration. 3. Ankle effusion. This is probably reactive. Septic arthritis is less likely. 4. Infectious tenosynovitis of the peroneal tendons. 5. No abscess in the proximal RIGHT leg.  06-08-14: right hand x-ray; mild osteoarthritis  06-08-14: right wrist x-ray; osteoarthritis right wrist  06-09-14: pelvic and left hip x-ray: mild degenerative disease left hip no significant change since 09-28-13.    LABS REVIEWED:   05-02-14: glucose 194; bun 29; creat 1.81; k+3.6; na++138  06-06-14: glucose 140; bun 23; creat 1.63; k+3.5; na++140; hgb  a1c  6.4  09-12-14: glucose 70; bun 24; creat 1.58; k+ 3.4; na++140  09-19-14: wbc 9.9; hgb 12.9; hct 37.8; mcv 90.4; plt 141; glucose 113; bun 28; creat 1.95; k+  3.8; na++141; liver normal albumin 4.0; chol 157; ldl 91; trig 200; hdl 26; hgb a1c 6.1  12-09-14: glucose 159; bun 19; creat  1.44; k+ 3.4; na++139; hgb a1c 6.2 12-26-14: k+ 3.6     Review of Systems Constitutional: Negative for appetite change and fatigue.  HENT: Negative for congestion.   Respiratory: Negative for cough, chest tightness and shortness of breath.   Cardiovascular: Negative for chest pain, palpitations and leg swelling.  Gastrointestinal: Negative for nausea, abdominal pain, diarrhea and constipation.  Musculoskeletal: Negative for myalgias and arthralgias.  Skin: sore on right ankle     Neurological: Negative for dizziness.  Psychiatric/Behavioral: The patient is not nervous/anxious.       Physical Exam Constitutional: No distress.  Overweight   Eyes: Conjunctivae are normal.  Neck: Neck supple. No JVD present. No thyromegaly present.  Cardiovascular: Normal rate, regular rhythm and intact distal pulses.   Respiratory: Effort normal and breath sounds normal. No respiratory distress. He has no wheezes.  GI: Soft. Bowel sounds are normal. He exhibits no distension. There is no tenderness.  Musculoskeletal: He exhibits no edema.  Able to move all extremities   Lymphadenopathy:    He has no cervical adenopathy.  Neurological: He is alert.  Skin: Skin is warm and dry. He is not diaphoretic.  Right ankle stage II: 0.8 x 0.6 pressure ulcer the surrounding tissue is inflamed and cellulitic   Psychiatric: He has a normal mood and affect.     ASSESSMENT/ PLAN:  1. Right lateral ankle pressure ulcer with cellulitis: will continue current treatment and will begin keflex 500 mg twice daily for 3 weeks with florastor and will monitor     Synthia Innocenteborah Marycatherine Maniscalco NP [facility]  Contact [phone] Monday through Friday 8am- 5pm  After hours call [phone]

## 2015-03-28 ENCOUNTER — Non-Acute Institutional Stay (SKILLED_NURSING_FACILITY): Payer: Medicare Other | Admitting: Internal Medicine

## 2015-03-28 ENCOUNTER — Encounter: Payer: Self-pay | Admitting: Internal Medicine

## 2015-03-28 DIAGNOSIS — E1149 Type 2 diabetes mellitus with other diabetic neurological complication: Secondary | ICD-10-CM

## 2015-03-28 DIAGNOSIS — E785 Hyperlipidemia, unspecified: Secondary | ICD-10-CM | POA: Diagnosis not present

## 2015-03-28 DIAGNOSIS — K5903 Drug induced constipation: Secondary | ICD-10-CM

## 2015-03-28 DIAGNOSIS — F0150 Vascular dementia without behavioral disturbance: Secondary | ICD-10-CM | POA: Diagnosis not present

## 2015-03-28 DIAGNOSIS — K219 Gastro-esophageal reflux disease without esophagitis: Secondary | ICD-10-CM

## 2015-03-28 DIAGNOSIS — L03115 Cellulitis of right lower limb: Secondary | ICD-10-CM | POA: Diagnosis not present

## 2015-03-28 DIAGNOSIS — I10 Essential (primary) hypertension: Secondary | ICD-10-CM

## 2015-03-28 DIAGNOSIS — G8929 Other chronic pain: Secondary | ICD-10-CM

## 2015-03-28 DIAGNOSIS — I5032 Chronic diastolic (congestive) heart failure: Secondary | ICD-10-CM | POA: Diagnosis not present

## 2015-03-28 DIAGNOSIS — L89512 Pressure ulcer of right ankle, stage 2: Secondary | ICD-10-CM | POA: Diagnosis not present

## 2015-03-28 NOTE — Progress Notes (Signed)
[name], male   DOB: [birth year], 82 y.o.   MRN: [id number]    DATE: 03/28/15  Location:  [facility]    Place of Service: SNF (31)   Extended Emergency Contact Information Primary Emergency Contact: Corrales,Lestie C Address: [address]          [place], [place] [address] [place] Home Phone: [phone] Relation: Spouse  Advanced Directive information   DNR  Chief Complaint  Patient presents with  . Medical Management of Chronic Issues    HPI:  82 yo male long term resident seen today for f/u. He was tx for RLE cellulitis a few weeks ago with keflex.  He is a poor historian due to dementia. Hx obtained from chart. No nursing issues. No falls. He c/o rlght lateral thigh pain.  Hypertension - BP stable on lopressor, norvasc,  Asa, lisinopril and clonidine   Chronic diastolic heart failure - stable on  Lasix, lopressor and imdur  Constipation - stable on miralax and senna   Hypokalemia - stable on  k+ 20 meq twice daily. Last level 3.6    GERD - sx's stable on  prilosec    Dyslipidemia - takes tricor, lipitor.  LDL is 91; TG 200   DM - insulin dependent with  lantus and uses novolog qAC for cbg >=150. Last a1c is 6.2%. He takes neurontin for neuropathy  Depression with anxiety  - stable on wellbutrin sr, ativan   Vascular dementia - stable on namenda xr   Chronic pain controlled on  oxycontin 30 mg twice daily; neurontin 100 mg twice daily; lidoderm patch to his right neck daily and left hip; voltaren gel 2 gm to right wrist three times daily  and oxycodone 5 mg every 8 hours as needed    Anemia - stable with recent hgb 12.9  Past Medical History  Diagnosis Date  . 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 Hypertension   . Peripheral vascular disease (HCC)   . Depression   . Anxiety   . GERD (gastroesophageal reflux disease)   . Renal insufficiency   . Dyslipidemia   . DDD (degenerative disc disease),  lumbosacral   . History of colon polyps   . History of MRSA infection     Hx chronic diabetic ulcer's secondary MRSA  . Ulcer of right ankle (HCC)   . Insulin dependent type 2 diabetes mellitus (HCC)   . History of diabetic ulcer of foot   . Diabetic peripheral neuropathy (HCC)   . Wheelchair dependent   . At high risk for falls   . Chronic diastolic congestive heart failure (HCC) 06/13/2012    Echo in 2011 with grade 1 diastolic dysfunction and normal systolic function with EF 60%.     . Portal hypertension (HCC) 01/15/2010    Qualifier: Diagnosis of  By: Everardo All MD, Sean A   . UNSPECIFIED PERIPHERAL VASCULAR DISEASE 01/15/2010    Qualifier: Diagnosis of  By: Everardo All MD, Cleophas Dunker     Past Surgical History  Procedure Laterality Date  . Laparoscopic cholecystectomy    . Inguinal hernia repair Left 11-30-2001  . Colonoscopy with esophagogastroduodenoscopy (egd)  09-21-2002  . Transthoracic echocardiogram  12-20-2009    severe LVH/  ef 60%/  grade I diastolic dysfunction/  AV sclerosis without stenosis/  mild LAE and RAE/    . Toe amputation    . Incision and drainage of wound Right 01/10/2014    Procedure: IRRIGATION  AND DEBRIDEMENT RIGHT ANKLE WOUND WITH PLACEMENT OF ACELL;  Surgeon: Wayland Denis, DO;  Location: [facility];  Service: Plastics;  Laterality: Right;    Patient Care Team: Kirt Boys, DO as PCP - General (Internal Medicine) Sharee Holster, NP as Nurse Practitioner (Nurse Practitioner) [facility] (Skilled Nursing Facility)  Social History   Social History  . Marital Status: Married    Spouse Name: N/A  . Number of Children: N/A  . Years of Education: N/A   Occupational History  .      Retired   Social History Main Topics  . Smoking status: Former Games developer  . Smokeless tobacco: Not on file  . Alcohol Use: No     Comment: hx alcohol abuse  in remission  . Drug Use: Not on file  . Sexual Activity: Not on file   Other  Topics Concern  . Not on file   Social History Narrative   Lives at [facility]     reports that he has quit smoking. He does not have any smokeless tobacco history on file. He reports that he does not drink alcohol. His drug history is not on file.  Immunization History  Administered Date(s) Administered  . Influenza Split 12/06/2011  . Influenza Whole 12/24/2012  . Influenza-Unspecified 12/15/2013, 11/28/2014  . PPD Test 03/29/2009  . Pneumococcal Conjugate-13 08/21/2005    No Known Allergies  Medications: Patient's Medications  New Prescriptions   No medications on file  Previous Medications   AMLODIPINE (NORVASC) 5 MG TABLET    Take 10 mg by mouth every morning.    ASPIRIN 81 MG TABLET    Take 81 mg by mouth every morning.    ATORVASTATIN (LIPITOR) 10 MG TABLET    Take 1 tablet (10 mg total) by mouth daily.   BUPROPION (WELLBUTRIN) 75 MG TABLET    Take 75 mg by mouth 2 (two) times daily.    CLONIDINE (CATAPRES) 0.1 MG TABLET    Take 0.1-0.2 mg by mouth 2 (two) times daily. Give 0.2 mg in the AM and 0.1 in the PM   DICLOFENAC SODIUM (VOLTAREN) 1 % GEL    Apply 2 g topically 3 (three) times daily. To right wrist   DORZOLAMIDE-TIMOLOL (COSOPT) 22.3-6.8 MG/ML OPHTHALMIC SOLUTION    Place 1 drop into both eyes 2 (two) times daily.   FENOFIBRATE (TRICOR) 145 MG TABLET    Take 145 mg by mouth every evening.    FUROSEMIDE (LASIX) 40 MG TABLET    Take 40 mg by mouth 2 (two) times daily.    GABAPENTIN (NEURONTIN) 100 MG CAPSULE    Take 100 mg by mouth 2 (two) times daily.   INSULIN ASPART (NOVOLOG) 100 UNIT/ML INJECTION    Inject 8 Units into the skin 3 (three) times daily before meals. for cbg >=150   INSULIN GLARGINE (LANTUS) 100 UNIT/ML INJECTION    Inject 28 Units into the skin at bedtime.    ISOSORBIDE MONONITRATE (IMDUR) 60 MG 24 HR TABLET    Take 60 mg by mouth every morning.    LATANOPROST (XALATAN) 0.005 % OPHTHALMIC SOLUTION    Place 1 drop into both eyes at  bedtime.     LIDOCAINE (LIDODERM) 5 %    Place 1 patch onto the skin every 12 (twelve) hours. Remove & Discard patch within 12 hours to right neck  And to left hip   LISINOPRIL (PRINIVIL,ZESTRIL) 5 MG TABLET    Take  10 mg by mouth daily.    LORAZEPAM (ATIVAN) 0.5 MG TABLET    Take 0.5 mg by mouth daily. Prior to AM care   MAGNESIUM HYDROXIDE (MILK OF MAGNESIA) 400 MG/5ML SUSPENSION    Take 60 mLs by mouth daily as needed.    MEMANTINE HCL ER (NAMENDA XR) 14 MG CP24    Take 1 tablet by mouth every morning.   METOPROLOL (LOPRESSOR) 50 MG TABLET    Take 50 mg by mouth 2 (two) times daily.   OMEPRAZOLE (PRILOSEC) 20 MG CAPSULE    Take 20 mg by mouth every morning.    OXYCODONE (OXY-IR) 5 MG CAPSULE    Take one tablet by mouth every 8 hours as needed for pain.   OXYCODONE HCL ER (OXYCONTIN) 30 MG T12A    Take one tablet by mouth every 12 hours for pain NO NOT CUT/CRUSH/CHEW   POLYETHYLENE GLYCOL POWDER (GLYCOLAX/MIRALAX) POWDER    Take 17 g by mouth 2 (two) times daily.    POTASSIUM CHLORIDE (K-DUR,KLOR-CON) 10 MEQ TABLET    Take 20 mEq by mouth 2 (two) times daily.    PROTEIN SUPPLEMENT (PROMOD) POWD    Take 1 scoop by mouth 2 (two) times daily.   SENNA (SENOKOT) 8.6 MG TABLET    Take 2 tablets by mouth 2 (two) times daily.   Modified Medications   No medications on file  Discontinued Medications   No medications on file    Review of Systems  Unable to perform ROS: Dementia    Filed Vitals:   03/28/15 1515  BP: 138/74  Pulse: 80  Temp: 97.3 F (36.3 C)  Weight: 235 lb (106.595 kg)  SpO2: 94%   Body mass index is 33.72 kg/(m^2).  Physical Exam  Constitutional: He appears well-developed.  Frail appearing in NAD, lying in bed resting  HENT:  Mouth/Throat: Oropharynx is clear and moist.  Eyes: Pupils are equal, round, and reactive to light. No scleral icterus.  Neck: Neck supple. Carotid bruit is not present. No thyromegaly present.  Cardiovascular: Normal rate, regular rhythm and  intact distal pulses.  Exam reveals no gallop and no friction rub.   Murmur (1/6 SEM) heard. no distal LE swelling. No calf TTP  Pulmonary/Chest: Effort normal and breath sounds normal. He has no wheezes. He has no rales. He exhibits no tenderness.  Abdominal: Soft. Bowel sounds are normal. He exhibits no distension, no abdominal bruit, no pulsatile midline mass and no mass. There is tenderness (generally). There is no rebound and no guarding.  Musculoskeletal: He exhibits edema and tenderness.  Right greater trochanteric TTP and swelling and iliotibial band  Lymphadenopathy:    He has no cervical adenopathy.  Neurological: He is alert.  Skin: Skin is warm and dry. Rash (R>L anterior leg; abdominal wall excoriations; no vesicular formation) noted.  B/l foot wound dsg c/d/i  Psychiatric: He has a normal mood and affect. His behavior is normal.     Labs reviewed: 09-19-14: wbc 9.9; hgb 12.9; hct 37.8; mcv 90.4; plt 141; glucose 113; bun 28; creat 1.95; k+ 3.8; na++141; liver normal albumin 4.0; chol 157; ldl 91; trig 200; hdl 26; hgb a1c 6.1  12-09-14: glucose 159; bun 19; creat 1.44; k+ 3.4; na++139; hgb a1c 6.2 12-26-14: k+ 3.6   No results found.   Assessment/Plan   ICD-9-CM ICD-10-CM   1. Cellulitis of right ankle - improving 682.6 L03.115   2. Vascular dementia, without behavioral disturbance 290.40 F01.50   3. Essential hypertension  401.9 I10   4. Type 2 diabetes mellitus with neurological manifestations, controlled (HCC) 250.60 E11.49   5. Chronic pain 338.29 G89.29   6. Chronic diastolic congestive heart failure (HCC) 428.32 I50.32    428.0    7. Dyslipidemia 272.4 E78.5   8. Constipation due to pain medication 564.09 K59.03    E947.9    9. Gastroesophageal reflux disease, esophagitis presence not specified 530.81 K21.9   10. Stage II pressure ulcer of right ankle 707.06 L89.512    707.22      Complete abx as ordered (stop date 1/25th)  Cont current meds as  ordered  Wound care as ordered  PT/OT/ST as indicated  Will follow  Keirston Saephanh S. Ancil Linsey  [facility] and [facility] [address] [place], [place] [address] [phone] Cell (Monday-Friday 8 AM - 5 PM) [phone] After 5 PM and follow prompts

## 2015-04-10 ENCOUNTER — Other Ambulatory Visit: Payer: Self-pay

## 2015-04-10 MED ORDER — OXYCODONE HCL ER 30 MG PO T12A: EXTENDED_RELEASE_TABLET | ORAL | Status: DC

## 2015-04-24 ENCOUNTER — Non-Acute Institutional Stay (SKILLED_NURSING_FACILITY): Payer: Medicare Other | Admitting: Adult Health

## 2015-04-24 ENCOUNTER — Encounter: Payer: Self-pay | Admitting: Adult Health

## 2015-04-24 DIAGNOSIS — F0150 Vascular dementia without behavioral disturbance: Secondary | ICD-10-CM

## 2015-04-24 DIAGNOSIS — D649 Anemia, unspecified: Secondary | ICD-10-CM

## 2015-04-24 DIAGNOSIS — E1169 Type 2 diabetes mellitus with other specified complication: Secondary | ICD-10-CM

## 2015-04-24 DIAGNOSIS — E1142 Type 2 diabetes mellitus with diabetic polyneuropathy: Secondary | ICD-10-CM

## 2015-04-24 DIAGNOSIS — I5032 Chronic diastolic (congestive) heart failure: Secondary | ICD-10-CM | POA: Diagnosis not present

## 2015-04-24 DIAGNOSIS — E1149 Type 2 diabetes mellitus with other diabetic neurological complication: Secondary | ICD-10-CM

## 2015-04-24 DIAGNOSIS — K5903 Drug induced constipation: Secondary | ICD-10-CM

## 2015-04-24 DIAGNOSIS — I119 Hypertensive heart disease without heart failure: Secondary | ICD-10-CM

## 2015-04-24 DIAGNOSIS — E785 Hyperlipidemia, unspecified: Secondary | ICD-10-CM

## 2015-04-24 NOTE — Progress Notes (Signed)
[name], male   DOB: 08/01/1929, 82 y.o.   MRN: [id number]   Facility: [facility]       No Known Allergies  Chief Complaint  Patient presents with  . Medical Management of Chronic Issues    Follow up    HPI:  He is a long term resident of this facility being seen for the management of his chronic illnesses. Overall there is little change in his status. His current weight is 233 pounds; his weight in Dec 2016 was 230 pounds. He is not voicing any concerns or complaints today. There are no nursing concerns at this time.     Past Medical History  Diagnosis Date  . 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 Hypertension   . Peripheral vascular disease (HCC)   . Depression   . Anxiety   . GERD (gastroesophageal reflux disease)   . Renal insufficiency   . Dyslipidemia   . DDD (degenerative disc disease), lumbosacral   . History of colon polyps   . History of MRSA infection     Hx chronic diabetic ulcer's secondary MRSA  . Ulcer of right ankle (HCC)   . Insulin dependent type 2 diabetes mellitus (HCC)   . History of diabetic ulcer of foot   . Diabetic peripheral neuropathy (HCC)   . Wheelchair dependent   . At high risk for falls   . Chronic diastolic congestive heart failure (HCC) 06/13/2012    Echo in 2011 with grade 1 diastolic dysfunction and normal systolic function with EF 60%.     . Portal hypertension (HCC) 01/15/2010    Qualifier: Diagnosis of  By: Everardo All MD, Sean A   . UNSPECIFIED PERIPHERAL VASCULAR DISEASE 01/15/2010    Qualifier: Diagnosis of  By: Everardo All MD, Cleophas Dunker     Past Surgical History  Procedure Laterality Date  . Laparoscopic cholecystectomy    . Inguinal hernia repair Left 11-30-2001  . Colonoscopy with esophagogastroduodenoscopy (egd)  09-21-2002  . Transthoracic echocardiogram  12-20-2009    severe LVH/  ef 60%/  grade I diastolic dysfunction/  AV sclerosis without stenosis/  mild LAE and RAE/    . Toe amputation    . Incision  and drainage of wound Right 01/10/2014    Procedure: IRRIGATION AND DEBRIDEMENT RIGHT ANKLE WOUND WITH PLACEMENT OF ACELL;  Surgeon: Wayland Denis, DO;  Location: [facility];  Service: Plastics;  Laterality: Right;    VITAL SIGNS BP 126/90 mmHg  Pulse 63  Temp(Src) 98.7 F (37.1 C) (Oral)  Resp 17  Ht 5\' 10"  (1.778 m)  Wt 233 lb (105.688 kg)  BMI 33.43 kg/m2  SpO2 95%  Patient's Medications  New Prescriptions   No medications on file  Previous Medications   AMLODIPINE (NORVASC) 5 MG TABLET    Take 10 mg by mouth every morning.    ASPIRIN 81 MG TABLET    Take 81 mg by mouth every morning.    ATORVASTATIN (LIPITOR) 10 MG TABLET    Take 1 tablet (10 mg total) by mouth daily.   BUPROPION (WELLBUTRIN) 75 MG TABLET    Take 75 mg by mouth 2 (two) times daily.    CLONIDINE (CATAPRES) 0.1 MG TABLET    Take 0.1-0.2 mg by mouth 2 (two) times daily. Give 0.2 mg in the AM and 0.1 in the PM   COAL TAR (NEUTROGENA T-GEL) 0.5 % SHAMPOO    Apply topically. Apply topically every 96 hours for dandruff weekly  as needed   DICLOFENAC SODIUM (VOLTAREN) 1 % GEL    Apply 2 g topically 3 (three) times daily. To right wrist   DORZOLAMIDE-TIMOLOL (COSOPT) 22.3-6.8 MG/ML OPHTHALMIC SOLUTION    Place 1 drop into both eyes 2 (two) times daily.   FENOFIBRATE (TRICOR) 145 MG TABLET    Take 145 mg by mouth every evening.    FUROSEMIDE (LASIX) 40 MG TABLET    Take 40 mg by mouth 2 (two) times daily.    GABAPENTIN (NEURONTIN) 100 MG CAPSULE    Take 100 mg by mouth 2 (two) times daily.   INSULIN ASPART (NOVOLOG) 100 UNIT/ML INJECTION    Inject 8 Units into the skin 3 (three) times daily before meals. for cbg >=150   INSULIN GLARGINE (LANTUS) 100 UNIT/ML INJECTION    Inject 28 Units into the skin at bedtime.    ISOSORBIDE MONONITRATE (IMDUR) 60 MG 24 HR TABLET    Take 60 mg by mouth every morning.    LATANOPROST (XALATAN) 0.005 % OPHTHALMIC SOLUTION    Place 1 drop into both eyes at bedtime.     LIDOCAINE  (LIDODERM) 5 %    Place 1 patch onto the skin every 12 (twelve) hours. Remove & Discard patch within 12 hours to right neck  And to left hip   LISINOPRIL (PRINIVIL,ZESTRIL) 5 MG TABLET    Take 10 mg by mouth daily.    LORAZEPAM (ATIVAN) 0.5 MG TABLET    Take 0.5 mg by mouth daily. Prior to AM care   MAGNESIUM HYDROXIDE (MILK OF MAGNESIA) 400 MG/5ML SUSPENSION    Take 30 mLs by mouth daily as needed for mild constipation.   MEMANTINE HCL ER (NAMENDA XR) 14 MG CP24    Take 1 tablet by mouth every morning.   METOPROLOL (LOPRESSOR) 50 MG TABLET    Take 50 mg by mouth 2 (two) times daily.   OMEPRAZOLE (PRILOSEC) 20 MG CAPSULE    Take 20 mg by mouth every morning.    ONDANSETRON (ZOFRAN) 4 MG TABLET    Take 4 mg by mouth every 8 (eight) hours as needed for nausea or vomiting.   OXYCODONE (OXY-IR) 5 MG CAPSULE    Take one tablet by mouth every 8 hours as needed for pain.   OXYCODONE (OXYCONTIN) 30 MG 12 HR TABLET    Take one tablet by mouth every 12 hours for pain NO NOT CUT/CRUSH/CHEW   POLYETHYLENE GLYCOL POWDER (GLYCOLAX/MIRALAX) POWDER    Take 17 g by mouth 2 (two) times daily.    POTASSIUM CHLORIDE SA (KLOR-CON M20) 20 MEQ TABLET    Take 20 mEq by mouth 2 (two) times daily.   PROTEIN SUPPLEMENT (PROMOD) POWD    Take 1 scoop by mouth 2 (two) times daily.   SENNA (SENOKOT) 8.6 MG TABLET    Take 2 tablets by mouth 2 (two) times daily.   Modified Medications   No medications on file  Discontinued Medications   MAGNESIUM HYDROXIDE (MILK OF MAGNESIA) 400 MG/5ML SUSPENSION    Take 60 mLs by mouth daily as needed. Reported on 04/24/2015   POTASSIUM CHLORIDE (K-DUR,KLOR-CON) 10 MEQ TABLET    Take 20 mEq by mouth 2 (two) times daily. Reported on 04/24/2015     SIGNIFICANT DIAGNOSTIC EXAMS  03-14-14: right ankle tib/fib mri: 1. 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 2. T2 hyperintense probable draining abscess deep to ulceration. 3. Ankle effusion. This  is probably reactive. Septic arthritis is less  likely. 4. Infectious tenosynovitis of the peroneal tendons. 5. No abscess in the proximal RIGHT leg.  06-08-14: right hand x-ray; mild osteoarthritis  06-08-14: right wrist x-ray; osteoarthritis right wrist  06-09-14: pelvic and left hip x-ray: mild degenerative disease left hip no significant change since 09-28-13.    LABS REVIEWED:   05-02-14: glucose 194; bun 29; creat 1.81; k+3.6; na++138  06-06-14: glucose 140; bun 23; creat 1.63; k+3.5; na++140; hgb  a1c  6.4  09-12-14: glucose 70; bun 24; creat 1.58; k+ 3.4; na++140  09-19-14: wbc 9.9; hgb 12.9; hct 37.8; mcv 90.4; plt 141; glucose 113; bun 28; creat 1.95; k+ 3.8; na++141; liver normal albumin 4.0; chol 157; ldl 91; trig 200; hdl 26; hgb a1c 6.1  12-09-14: glucose 159; bun 19; creat 1.44; k+ 3.4; na++139; hgb a1c 6.2 12-26-14: k+ 3.6     Review of Systems Constitutional: Negative for appetite change and fatigue.  HENT: Negative for congestion.   Respiratory: Negative for cough, chest tightness and shortness of breath.   Cardiovascular: Negative for chest pain, palpitations and leg swelling.  Gastrointestinal: Negative for nausea, abdominal pain, diarrhea and constipation.  Musculoskeletal: Negative for myalgias and arthralgias.  Skin: no complaints     Neurological: Negative for dizziness.  Psychiatric/Behavioral: The patient is not nervous/anxious.       Physical Exam Constitutional: No distress.  Overweight   Eyes: Conjunctivae are normal.  Neck: Neck supple. No JVD present. No thyromegaly present.  Cardiovascular: Normal rate, regular rhythm and intact distal pulses.   Respiratory: Effort normal and breath sounds normal. No respiratory distress. He has no wheezes.  GI: Soft. Bowel sounds are normal. He exhibits no distension. There is no tenderness.  Musculoskeletal: He exhibits no edema.  Able to move all extremities   Lymphadenopathy:    He has no cervical adenopathy.    Neurological: He is alert.  Skin: Skin is warm and dry. He is not diaphoretic.  Right ankle is without signs of infection   Psychiatric: He has a normal mood and affect.       ASSESSMENT/ PLAN:   1. Hypertension: will continue lopressor 50 mg twice daily; norvasc 10 mg daily asa 81 mg daily; will continue  lisinopril  10 mg daily and clonidine 0.2 in the AM and 0.1 mg in the PM  will monitor his status.   2. Chronic diastolic heart failure: will continue lasix 40 mg twice daily lopressor 50 mg twice daily imdur 60 mg daily   3. Constipation: will continue miralax twice daily; senna 2 tabs twice daily   4. Hypokalemia will continue  k+ 20 meq twice daily k+ is 3.6   5. GERD: will continue prilosec 20 mg daily   6. Dyslipidemia: will continue tricor 145 mg daily; will continue  lipitor 10 mg daily; ldl is 91; trig is 200   7. Diabetes: will continue lantus 28 units daily; novolog 8 units prior to meals for cbg >=150 hgb a1c is 6.2  8. Depression with anxiety will continue wellbutrin sr 75 mg twice daily; ativan 0.5 mg daily prior to AM care   for anxiety and will monitor  9. Vascular dementia: is without change; will continue namenda xr 14 mg daily and will monitor   10. Chronic pain: his pain is presently being managed will continue oxycontin 30 mg twice daily; neurontin 100 mg twice daily; lidoderm patch to his right neck daily and left hip; voltaren gel 2 gm to right wrist three times daily  and  oxycodone 5 mg every 8 hours as needed   12. Glaucoma; will continue latanoprost to both eyes nightly and cosopt to both eyes twice daily .   13. Anemia: his hgb is 12.9; will monitor      Will check cbc; cmp; lipids hgb a1c urine for micro-albumin      Synthia Innocent NP [facility]  Contact [phone] Monday through Friday 8am- 5pm  After hours call [phone]

## 2015-04-26 LAB — BASIC METABOLIC PANEL
BUN: 29 mg/dL — AB (ref 4–21)
Creatinine: 1.9 mg/dL — AB (ref 0.6–1.3)
Potassium: 3.8 mmol/L (ref 3.4–5.3)
SODIUM: 142 mmol/L (ref 137–147)

## 2015-04-26 LAB — LIPID PANEL
Cholesterol: 98 mg/dL (ref 0–200)
HDL: 23 mg/dL — AB (ref 35–70)
LDL CALC: 39 mg/dL
Triglycerides: 179 mg/dL — AB (ref 40–160)

## 2015-04-26 LAB — HEPATIC FUNCTION PANEL
ALT: 72 U/L — AB (ref 10–40)
AST: 10 U/L — AB (ref 14–40)
Alkaline Phosphatase: 72 U/L (ref 25–125)
Bilirubin, Total: 0.4 mg/dL

## 2015-04-26 LAB — MICROALBUMIN, URINE: Microalb, Ur: 55.8

## 2015-04-26 LAB — CBC AND DIFFERENTIAL
HCT: 32 % — AB (ref 41–53)
HEMOGLOBIN: 10.8 g/dL — AB (ref 13.5–17.5)
Platelets: 108 10*3/uL — AB (ref 150–399)
WBC: 9.4 10*3/mL

## 2015-05-08 LAB — HEMOGLOBIN A1C: Hemoglobin A1C: 6.9

## 2015-05-29 ENCOUNTER — Non-Acute Institutional Stay (SKILLED_NURSING_FACILITY): Payer: Medicare Other | Admitting: Adult Health

## 2015-05-29 ENCOUNTER — Encounter: Payer: Self-pay | Admitting: Adult Health

## 2015-05-29 DIAGNOSIS — I110 Hypertensive heart disease with heart failure: Secondary | ICD-10-CM | POA: Insufficient documentation

## 2015-05-29 DIAGNOSIS — E1149 Type 2 diabetes mellitus with other diabetic neurological complication: Secondary | ICD-10-CM

## 2015-05-29 DIAGNOSIS — F0150 Vascular dementia without behavioral disturbance: Secondary | ICD-10-CM

## 2015-05-29 DIAGNOSIS — E1142 Type 2 diabetes mellitus with diabetic polyneuropathy: Secondary | ICD-10-CM

## 2015-05-29 DIAGNOSIS — E1169 Type 2 diabetes mellitus with other specified complication: Secondary | ICD-10-CM

## 2015-05-29 DIAGNOSIS — M159 Polyosteoarthritis, unspecified: Secondary | ICD-10-CM

## 2015-05-29 DIAGNOSIS — M150 Primary generalized (osteo)arthritis: Secondary | ICD-10-CM | POA: Diagnosis not present

## 2015-05-29 DIAGNOSIS — I5032 Chronic diastolic (congestive) heart failure: Secondary | ICD-10-CM | POA: Diagnosis not present

## 2015-05-29 DIAGNOSIS — G8929 Other chronic pain: Secondary | ICD-10-CM

## 2015-05-29 DIAGNOSIS — K5903 Drug induced constipation: Secondary | ICD-10-CM | POA: Diagnosis not present

## 2015-05-29 DIAGNOSIS — E785 Hyperlipidemia, unspecified: Secondary | ICD-10-CM

## 2015-05-29 DIAGNOSIS — H409 Unspecified glaucoma: Secondary | ICD-10-CM | POA: Diagnosis not present

## 2015-05-29 LAB — PREALBUMIN: PREALBUMIN: 22

## 2015-05-29 LAB — BASIC METABOLIC PANEL: Glucose: 281 mg/dL

## 2015-05-29 NOTE — Progress Notes (Signed)
[name], male   DOB: 1930/02/01, 82 y.o.   MRN: [id number]   Facility: [facility]     DNR    No Known Allergies  Chief Complaint  Patient presents with  . Annual Exam      HPI:  He is a long term resident of this facility being seen for his annual exam. Overall his status has remained stable over the past year. He has not required any hospitalizations. He is not voicing any concerns or complaints today. He tells me that he is feeling good. There are no nursing concerns at this time.   Past Medical History  Diagnosis Date  . 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 Hypertension   . Peripheral vascular disease (HCC)   . Depression   . Anxiety   . GERD (gastroesophageal reflux disease)   . Renal insufficiency   . Dyslipidemia   . DDD (degenerative disc disease), lumbosacral   . History of colon polyps   . History of MRSA infection     Hx chronic diabetic ulcer's secondary MRSA  . Ulcer of right ankle (HCC)   . Insulin dependent type 2 diabetes mellitus (HCC)   . History of diabetic ulcer of foot   . Diabetic peripheral neuropathy (HCC)   . Wheelchair dependent   . At high risk for falls   . Chronic diastolic congestive heart failure (HCC) 06/13/2012    Echo in 2011 with grade 1 diastolic dysfunction and normal systolic function with EF 60%.     . Portal hypertension (HCC) 01/15/2010    Qualifier: Diagnosis of  By: Everardo All MD, Sean A   . UNSPECIFIED PERIPHERAL VASCULAR DISEASE 01/15/2010    Qualifier: Diagnosis of  By: Everardo All MD, Cleophas Dunker     Past Surgical History  Procedure Laterality Date  . Laparoscopic cholecystectomy    . Inguinal hernia repair Left 11-30-2001  . Colonoscopy with esophagogastroduodenoscopy (egd)  09-21-2002  . Transthoracic echocardiogram  12-20-2009    severe LVH/  ef 60%/  grade I diastolic dysfunction/  AV sclerosis without stenosis/  mild LAE and RAE/    . Toe amputation    . Incision and drainage of wound Right 01/10/2014     Procedure: IRRIGATION AND DEBRIDEMENT RIGHT ANKLE WOUND WITH PLACEMENT OF ACELL;  Surgeon: Wayland Denis, DO;  Location: [facility];  Service: Plastics;  Laterality: Right;    Family History  Problem Relation Age of Onset  . Diabetes Neg Hx     No DM in immediate family    Social History   Social History  . Marital Status: Married    Spouse Name: N/A  . Number of Children: N/A  . Years of Education: N/A   Occupational History  .      Retired   Social History Main Topics  . Smoking status: Former Games developer  . Smokeless tobacco: Not on file  . Alcohol Use: No     Comment: hx alcohol abuse  in remission  . Drug Use: Not on file  . Sexual Activity: Not on file   Other Topics Concern  . Not on file   Social History Narrative   Lives at [facility]   Immunization History  Administered Date(s) Administered  . Influenza Split 12/06/2011  . Influenza Whole 12/24/2012  . Influenza-Unspecified 12/15/2013, 11/28/2014  . PPD Test 03/29/2009  . Pneumococcal Conjugate-13 08/21/2005      VITAL SIGNS BP 162/74 mmHg  Pulse 66  Temp(Src)  98.6 F (37 C) (Oral)  Resp 18  Ht 5\' 10"  (1.778 m)  Wt 247 lb (112.038 kg)  BMI 35.44 kg/m2  SpO2 97%  Patient's Medications  New Prescriptions   No medications on file  Previous Medications   AMLODIPINE (NORVASC) 5 MG TABLET    Take 10 mg by mouth every morning.    ASPIRIN 81 MG TABLET    Take 81 mg by mouth every morning.    ATORVASTATIN (LIPITOR) 10 MG TABLET    Take 1 tablet (10 mg total) by mouth daily.   BUPROPION (WELLBUTRIN) 75 MG TABLET    Take 75 mg by mouth 2 (two) times daily.    CLONIDINE (CATAPRES) 0.1 MG TABLET    Take 0.1-0.2 mg by mouth 2 (two) times daily. Give 0.2 mg in the AM and 0.1 in the PM   COAL TAR (NEUTROGENA T-GEL) 0.5 % SHAMPOO    Apply topically. Apply topically every 96 hours for dandruff weekly as needed   DICLOFENAC SODIUM (VOLTAREN) 1 % GEL    Apply 2 g topically 3 (three)  times daily. To right wrist   DORZOLAMIDE-TIMOLOL (COSOPT) 22.3-6.8 MG/ML OPHTHALMIC SOLUTION    Place 1 drop into both eyes 2 (two) times daily.   FENOFIBRATE (TRICOR) 145 MG TABLET    Take 145 mg by mouth every evening.    FUROSEMIDE (LASIX) 40 MG TABLET    Take 40 mg by mouth 2 (two) times daily.    GABAPENTIN (NEURONTIN) 100 MG CAPSULE    Take 100 mg by mouth 2 (two) times daily.   INSULIN ASPART (NOVOLOG) 100 UNIT/ML INJECTION    Inject 8 Units into the skin 3 (three) times daily before meals. for cbg >=150   INSULIN GLARGINE (LANTUS) 100 UNIT/ML INJECTION    Inject 28 Units into the skin at bedtime.    ISOSORBIDE MONONITRATE (IMDUR) 60 MG 24 HR TABLET    Take 60 mg by mouth every morning.    LATANOPROST (XALATAN) 0.005 % OPHTHALMIC SOLUTION    Place 1 drop into both eyes at bedtime.     LIDOCAINE (LIDODERM) 5 %    Place 1 patch onto the skin every 12 (twelve) hours. Remove & Discard patch within 12 hours to right neck  And to left hip   LISINOPRIL (PRINIVIL,ZESTRIL) 5 MG TABLET    Take 10 mg by mouth daily.    LORAZEPAM (ATIVAN) 0.5 MG TABLET    Take 0.5 mg by mouth daily. Prior to AM care   MAGNESIUM HYDROXIDE (MILK OF MAGNESIA) 400 MG/5ML SUSPENSION    Take 30 mLs by mouth daily as needed for mild constipation.   MEMANTINE HCL ER (NAMENDA XR) 14 MG CP24    Take 1 tablet by mouth every morning.   METOPROLOL (LOPRESSOR) 50 MG TABLET    Take 50 mg by mouth 2 (two) times daily.   NUTRITIONAL SUPPLEMENTS (NUTRITIONAL SUPPLEMENT PO)    Take by mouth. HSG Mech Soft Texture, for diet   OMEPRAZOLE (PRILOSEC) 20 MG CAPSULE    Take 20 mg by mouth every morning.    ONDANSETRON (ZOFRAN) 4 MG TABLET    Take 4 mg by mouth every 8 (eight) hours as needed for nausea or vomiting.   OXYCODONE (OXY-IR) 5 MG CAPSULE    Take one tablet by mouth every 8 hours as needed for pain.   OXYCODONE (OXYCONTIN) 30 MG 12 HR TABLET    Take one tablet by mouth every 12 hours for pain NO  NOT CUT/CRUSH/CHEW   POLYETHYLENE  GLYCOL POWDER (GLYCOLAX/MIRALAX) POWDER    Take 17 g by mouth 2 (two) times daily.    POTASSIUM CHLORIDE SA (KLOR-CON M20) 20 MEQ TABLET    Take 20 mEq by mouth 2 (two) times daily.   PROTEIN SUPPLEMENT (PROMOD) POWD    Take 1 scoop by mouth 2 (two) times daily.   SENNA (SENOKOT) 8.6 MG TABLET    Take 2 tablets by mouth 2 (two) times daily.    ZINC SULFATE (ZINC-220 PO)    Take 220 mg by mouth daily.  Modified Medications   No medications on file  Discontinued Medications   No medications on file     SIGNIFICANT DIAGNOSTIC EXAMS  03-14-14: right ankle tib/fib mri: 1. 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 2. T2 hyperintense probable draining abscess deep to ulceration. 3. Ankle effusion. This is probably reactive. Septic arthritis is less likely. 4. Infectious tenosynovitis of the peroneal tendons. 5. No abscess in the proximal RIGHT leg.  06-08-14: right hand x-ray; mild osteoarthritis  06-08-14: right wrist x-ray; osteoarthritis right wrist  06-09-14: pelvic and left hip x-ray: mild degenerative disease left hip no significant change since 09-28-13.    LABS REVIEWED:   06-06-14: glucose 140; bun 23; creat 1.63; k+3.5; na++140; hgb  a1c  6.4  09-12-14: glucose 70; bun 24; creat 1.58; k+ 3.4; na++140  09-19-14: wbc 9.9; hgb 12.9; hct 37.8; mcv 90.4; plt 141; glucose 113; bun 28; creat 1.95; k+ 3.8; na++141; liver normal albumin 4.0; chol 157; ldl 91; trig 200; hdl 26; hgb a1c 6.1  12-09-14: glucose 159; bun 19; creat 1.44; k+ 3.4; na++139; hgb a1c 6.2 12-26-14: k+ 3.6  04-26-15: wbc 9.4; hgb 10.8; hct 32.4; mcv 92.0; plt 108; glucose 188; bun 29; creat 1.93; k+ 3.8; na++142; liver normal albumin 3.6; chol 98; ldl 39; trig 179; hdl 23; hgb a1c 6.8 urine micro-albumin: 55.8 05-08-15: hgb a1c 6.9     Review of Systems Constitutional: Negative for appetite change and fatigue.  HENT: Negative for congestion.   Respiratory: Negative for cough,  chest tightness and shortness of breath.   Cardiovascular: Negative for chest pain, palpitations and leg swelling.  Gastrointestinal: Negative for nausea, abdominal pain, diarrhea and constipation.  Musculoskeletal: Negative for myalgias and arthralgias.  Skin: no complaints     Neurological: Negative for dizziness.  Psychiatric/Behavioral: The patient is not nervous/anxious.       Physical Exam Constitutional: No distress.  Overweight   Eyes: Conjunctivae are normal.  Neck: Neck supple. No JVD present. No thyromegaly present.  Cardiovascular: Normal rate, regular rhythm and intact distal pulses.   Respiratory: Effort normal and breath sounds normal. No respiratory distress. He has no wheezes.  GI: Soft. Bowel sounds are normal. He exhibits no distension. There is no tenderness.  Musculoskeletal: He exhibits no edema.  Able to move all extremities   Lymphadenopathy:    He has no cervical adenopathy.  Neurological: He is alert.  Skin: Skin is warm and dry. He is not diaphoretic.  Right ankle shearing: 1.1 x 0.5 0.05 cm no signs of infection present will continue silver alginate    Psychiatric: He has a normal mood and affect.       ASSESSMENT/ PLAN:   1. Hypertension: will continue lopressor 50 mg twice daily; norvasc 10 mg daily asa 81 mg daily; will continue  lisinopril  10 mg daily   Will increase his clonidine to  0.2 mg twice  daily and will have nursing check blood pressure twice daily   2. Chronic diastolic heart failure: will continue lasix 40 mg twice daily lopressor 50 mg twice daily imdur 60 mg daily   3. Constipation: will continue miralax twice daily; senna 2 tabs twice daily   4. Hypokalemia will continue  k+ 20 meq twice daily k+ is 3.8  5. GERD: will continue prilosec 20 mg daily   6. Dyslipidemia: will continue tricor 145 mg daily; will continue  lipitor 10 mg daily; ldl is 39; trig is 179  7. Diabetes: will continue lantus 28 units daily; novolog 8 units  prior to meals for cbg >=150 hgb a1c is 6.9  8. Depression with anxiety will continue wellbutrin sr 75 mg twice daily; ativan 0.5 mg daily prior to AM care   for anxiety and will monitor  9. Vascular dementia: is without change; will continue namenda xr 14 mg daily and will monitor   10. Chronic pain: his pain is presently being managed will continue oxycontin 30 mg twice daily; neurontin 100 mg twice daily; lidoderm patch to his right neck daily and left hip; voltaren gel 2 gm to right wrist three times daily  and oxycodone 5 mg every 8 hours as needed   12. Glaucoma; will continue latanoprost to both eyes nightly and cosopt to both eyes twice daily .   13. Anemia: his hgb is 10.8; will monitor    Will place him on the foot dr. List   Time spent with patient  45  minutes >50% time spent counseling; reviewing medical record; tests; labs; and developing future plan of care      Synthia Innocent NP [facility]  Contact [phone] Monday through Friday 8am- 5pm  After hours call [phone]

## 2015-06-29 ENCOUNTER — Non-Acute Institutional Stay (SKILLED_NURSING_FACILITY): Payer: Medicare Other | Admitting: Adult Health

## 2015-06-29 ENCOUNTER — Encounter: Payer: Self-pay | Admitting: Adult Health

## 2015-06-29 DIAGNOSIS — F0150 Vascular dementia without behavioral disturbance: Secondary | ICD-10-CM | POA: Diagnosis not present

## 2015-06-29 DIAGNOSIS — I83019 Varicose veins of right lower extremity with ulcer of unspecified site: Secondary | ICD-10-CM | POA: Diagnosis not present

## 2015-06-29 DIAGNOSIS — K219 Gastro-esophageal reflux disease without esophagitis: Secondary | ICD-10-CM | POA: Diagnosis not present

## 2015-06-29 DIAGNOSIS — E1149 Type 2 diabetes mellitus with other diabetic neurological complication: Secondary | ICD-10-CM

## 2015-06-29 DIAGNOSIS — E1142 Type 2 diabetes mellitus with diabetic polyneuropathy: Secondary | ICD-10-CM | POA: Diagnosis not present

## 2015-06-29 DIAGNOSIS — I110 Hypertensive heart disease with heart failure: Secondary | ICD-10-CM | POA: Diagnosis not present

## 2015-06-29 DIAGNOSIS — E785 Hyperlipidemia, unspecified: Secondary | ICD-10-CM

## 2015-06-29 DIAGNOSIS — F418 Other specified anxiety disorders: Secondary | ICD-10-CM

## 2015-06-29 DIAGNOSIS — I5032 Chronic diastolic (congestive) heart failure: Secondary | ICD-10-CM | POA: Diagnosis not present

## 2015-06-29 DIAGNOSIS — E1169 Type 2 diabetes mellitus with other specified complication: Secondary | ICD-10-CM

## 2015-06-29 DIAGNOSIS — L97919 Non-pressure chronic ulcer of unspecified part of right lower leg with unspecified severity: Secondary | ICD-10-CM

## 2015-06-29 DIAGNOSIS — K5903 Drug induced constipation: Secondary | ICD-10-CM | POA: Diagnosis not present

## 2015-06-29 NOTE — Progress Notes (Signed)
[name], male   DOB: 17-Sep-1929, 82 y.o.   MRN: [id number]   Facility[facility]       No Known Allergies  Chief Complaint  Patient presents with  . Medical Management of Chronic Issues    Follow up    HPI:  He is a long term resident of this facility being seen for the management of his chronic illnesses. Overall his status is stable. He tells me that he is feeling good and has no concerns or no complaints. There are no nursing concerns at this time.   Past Medical History  Diagnosis Date  . 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 Hypertension   . Peripheral vascular disease (HCC)   . Depression   . Anxiety   . GERD (gastroesophageal reflux disease)   . Renal insufficiency   . Dyslipidemia   . DDD (degenerative disc disease), lumbosacral   . History of colon polyps   . History of MRSA infection     Hx chronic diabetic ulcer's secondary MRSA  . Ulcer of right ankle (HCC)   . Insulin dependent type 2 diabetes mellitus (HCC)   . History of diabetic ulcer of foot   . Diabetic peripheral neuropathy (HCC)   . Wheelchair dependent   . At high risk for falls   . Chronic diastolic congestive heart failure (HCC) 06/13/2012    Echo in 2011 with grade 1 diastolic dysfunction and normal systolic function with EF 60%.     . Portal hypertension (HCC) 01/15/2010    Qualifier: Diagnosis of  By: Everardo All MD, Sean A   . UNSPECIFIED PERIPHERAL VASCULAR DISEASE 01/15/2010    Qualifier: Diagnosis of  By: Everardo All MD, Cleophas Dunker     Past Surgical History  Procedure Laterality Date  . Laparoscopic cholecystectomy    . Inguinal hernia repair Left 11-30-2001  . Colonoscopy with esophagogastroduodenoscopy (egd)  09-21-2002  . Transthoracic echocardiogram  12-20-2009    severe LVH/  ef 60%/  grade I diastolic dysfunction/  AV sclerosis without stenosis/  mild LAE and RAE/    . Toe amputation    . Incision and drainage of wound Right 01/10/2014    Procedure: IRRIGATION AND  DEBRIDEMENT RIGHT ANKLE WOUND WITH PLACEMENT OF ACELL;  Surgeon: Wayland Denis, DO;  Location: [facility];  Service: Plastics;  Laterality: Right;    VITAL SIGNS BP 177/61 mmHg  Pulse 70  Temp(Src) 97.8 F (36.6 C) (Oral)  Resp 18  Ht  (1.778 m)  Wt 272 lb 5 oz (123.52 kg)  BMI 39.07 kg/m2  SpO2 95%  Patient's Medications  New Prescriptions   No medications on file  Previous Medications   AMLODIPINE (NORVASC) 5 MG TABLET    Take 10 mg by mouth every morning.    ASPIRIN 81 MG TABLET    Take 81 mg by mouth every morning.    ATORVASTATIN (LIPITOR) 10 MG TABLET    Take 1 tablet (10 mg total) by mouth daily.   BUPROPION (WELLBUTRIN) 75 MG TABLET    Take 75 mg by mouth 2 (two) times daily.    CLONIDINE (CATAPRES) 0.1 MG TABLET    Take 0.1-0.2 mg by mouth 2 (two) times daily. Give 0.2 mg in the AM and 0.1 in the PM   COAL TAR (NEUTROGENA T-GEL) 0.5 % SHAMPOO    Apply topically. Apply topically every 96 hours for dandruff weekly as needed   DICLOFENAC SODIUM (VOLTAREN) 1 % GEL  Apply 2 g topically 3 (three) times daily. To right wrist   DORZOLAMIDE-TIMOLOL (COSOPT) 22.3-6.8 MG/ML OPHTHALMIC SOLUTION    Place 1 drop into both eyes 2 (two) times daily.   FENOFIBRATE (TRICOR) 145 MG TABLET    Take 145 mg by mouth every evening.    FUROSEMIDE (LASIX) 40 MG TABLET    Take 40 mg by mouth 2 (two) times daily.    GABAPENTIN (NEURONTIN) 100 MG CAPSULE    Take 100 mg by mouth 2 (two) times daily.   INSULIN ASPART (NOVOLOG) 100 UNIT/ML INJECTION    Inject 8 Units into the skin 3 (three) times daily before meals. for cbg >=150   INSULIN GLARGINE (LANTUS) 100 UNIT/ML INJECTION    Inject 28 Units into the skin at bedtime.    ISOSORBIDE MONONITRATE (IMDUR) 60 MG 24 HR TABLET    Take 60 mg by mouth every morning.    LATANOPROST (XALATAN) 0.005 % OPHTHALMIC SOLUTION    Place 1 drop into both eyes at bedtime.     LIDOCAINE (LIDODERM) 5 %    Place 1 patch onto the skin every 12 (twelve)  hours. Remove & Discard patch within 12 hours to right neck  And to left hip   LISINOPRIL (PRINIVIL,ZESTRIL) 5 MG TABLET    Take 10 mg by mouth daily.    LORAZEPAM (ATIVAN) 0.5 MG TABLET    Take 0.5 mg by mouth daily. Prior to AM care   MAGNESIUM HYDROXIDE (MILK OF MAGNESIA) 400 MG/5ML SUSPENSION    Take 30 mLs by mouth daily as needed for mild constipation.   MEMANTINE HCL ER (NAMENDA XR) 14 MG CP24    Take 1 tablet by mouth every morning.   METOPROLOL (LOPRESSOR) 50 MG TABLET    Take 50 mg by mouth 2 (two) times daily.   NUTRITIONAL SUPPLEMENTS (NUTRITIONAL SUPPLEMENT PO)    Take by mouth. HSG Mech Soft Texture, for diet   OMEPRAZOLE (PRILOSEC) 20 MG CAPSULE    Take 20 mg by mouth every morning.    ONDANSETRON (ZOFRAN) 4 MG TABLET    Take 4 mg by mouth every 8 (eight) hours as needed for nausea or vomiting.   OXYCODONE (OXY-IR) 5 MG CAPSULE    Take one tablet by mouth every 8 hours as needed for pain.   OXYCODONE (OXYCONTIN) 30 MG 12 HR TABLET    Take one tablet by mouth every 12 hours for pain NO NOT CUT/CRUSH/CHEW   POLYETHYLENE GLYCOL POWDER (GLYCOLAX/MIRALAX) POWDER    Take 17 g by mouth 2 (two) times daily.    POTASSIUM CHLORIDE SA (KLOR-CON M20) 20 MEQ TABLET    Take 20 mEq by mouth 2 (two) times daily.   PROTEIN SUPPLEMENT (PROMOD) POWD    Take 1 scoop by mouth 2 (two) times daily.   SENNA (SENOKOT) 8.6 MG TABLET    Take 2 tablets by mouth 2 (two) times daily.   Modified Medications   No medications on file  Discontinued Medications   No medications on file     SIGNIFICANT DIAGNOSTIC EXAMS  03-14-14: right ankle tib/fib mri: 1. 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 2. T2 hyperintense probable draining abscess deep to ulceration. 3. Ankle effusion. This is probably reactive. Septic arthritis is less likely. 4. Infectious tenosynovitis of the peroneal tendons. 5. No abscess in the proximal RIGHT leg.  06-08-14: right hand x-ray;  mild osteoarthritis  06-08-14: right wrist x-ray; osteoarthritis right wrist  06-09-14: pelvic and  left hip x-ray: mild degenerative disease left hip no significant change since 09-28-13.    LABS REVIEWED:   09-12-14: glucose 70; bun 24; creat 1.58; k+ 3.4; na++140  09-19-14: wbc 9.9; hgb 12.9; hct 37.8; mcv 90.4; plt 141; glucose 113; bun 28; creat 1.95; k+ 3.8; na++141; liver normal albumin 4.0; chol 157; ldl 91; trig 200; hdl 26; hgb a1c 6.1  12-09-14: glucose 159; bun 19; creat 1.44; k+ 3.4; na++139; hgb a1c 6.2 12-26-14: k+ 3.6  04-26-15: wbc 9.4; hgb 10.8; hct 32.4; mcv 92.0; plt 108; glucose 188; bun 29; creat 1.93; k+ 3.8; na++142; liver normal albumin 3.6; chol 98; ldl 39; trig 179; hdl 23; hgb a1c 6.8 urine micro-albumin: 55.8 05-08-15: hgb a1c 6.9     Review of Systems Constitutional: Negative for appetite change and fatigue.  HENT: Negative for congestion.   Respiratory: Negative for cough, chest tightness and shortness of breath.   Cardiovascular: Negative for chest pain, palpitations and leg swelling.  Gastrointestinal: Negative for nausea, abdominal pain, diarrhea and constipation.  Musculoskeletal: Negative for myalgias and arthralgias.  Skin: no complaints     Neurological: Negative for dizziness.  Psychiatric/Behavioral: The patient is not nervous/anxious.       Physical Exam Constitutional: No distress.  Overweight   Eyes: Conjunctivae are normal.  Neck: Neck supple. No JVD present. No thyromegaly present.  Cardiovascular: Normal rate, regular rhythm and intact distal pulses.   Respiratory: Effort normal and breath sounds normal. No respiratory distress. He has no wheezes.  GI: Soft. Bowel sounds are normal. He exhibits no distension. There is no tenderness.  Musculoskeletal: He exhibits no edema.  Able to move all extremities   Lymphadenopathy:    He has no cervical adenopathy.  Neurological: He is alert.  Skin: Skin is warm and dry. He is not diaphoretic.    Right ankle shearing: 2 x 1 x 0.1 cm 20% eschar in wound bed;  no signs of infection present will continue silver alginate    Psychiatric: He has a normal mood and affect.       ASSESSMENT/ PLAN:   1. Hypertension: will continue lopressor 50 mg twice daily; norvasc 10 mg daily asa 81 mg daily; will continue  lisinopril  10 mg daily   Will continue clonidine   0.2 mg twice daily and will have nursing check blood pressure every 8 hours with weekly reports.    2. Chronic diastolic heart failure: will continue lasix 40 mg twice daily lopressor 50 mg twice daily imdur 60 mg daily   3. Constipation: will continue miralax twice daily; senna 2 tabs twice daily   4. Hypokalemia will continue  k+ 20 meq twice daily k+ is 3.8  5. GERD: will continue prilosec 20 mg daily   6. Dyslipidemia: will continue tricor 145 mg daily; will continue  lipitor 10 mg daily; ldl is 39; trig is 179  7. Diabetes: will continue lantus 28 units daily; novolog 8 units prior to meals for cbg >=150 hgb a1c is 6.9  8. Depression with anxiety will continue wellbutrin sr 75 mg twice daily; ativan 0.5 mg daily prior to AM care   for anxiety and will monitor  9. Vascular dementia: is without change; will continue namenda xr 14 mg daily and will monitor   10. Chronic pain: his pain is presently being managed will continue oxycontin 30 mg twice daily; neurontin 100 mg twice daily; lidoderm patch to his right neck daily and left hip; voltaren gel 2 gm to  right wrist three times daily  and oxycodone 5 mg every 8 hours as needed   12. Glaucoma; will continue latanoprost to both eyes nightly and cosopt to both eyes twice daily .   13. Anemia: his hgb is 10.8; will monitor     Time spent with patient  45  minutes >50% time spent counseling; reviewing medical record; tests; labs; and developing future plan of care            [name] Innocent NP [facility]  Contact [phone] Monday through Friday  8am- 5pm  After hours call [phone]

## 2015-07-27 ENCOUNTER — Encounter: Payer: Self-pay | Admitting: Adult Health

## 2015-07-27 ENCOUNTER — Non-Acute Institutional Stay (SKILLED_NURSING_FACILITY): Payer: Medicare Other | Admitting: Adult Health

## 2015-07-27 DIAGNOSIS — E1142 Type 2 diabetes mellitus with diabetic polyneuropathy: Secondary | ICD-10-CM

## 2015-07-27 DIAGNOSIS — E1169 Type 2 diabetes mellitus with other specified complication: Secondary | ICD-10-CM | POA: Diagnosis not present

## 2015-07-27 DIAGNOSIS — L89512 Pressure ulcer of right ankle, stage 2: Secondary | ICD-10-CM

## 2015-07-27 DIAGNOSIS — H409 Unspecified glaucoma: Secondary | ICD-10-CM

## 2015-07-27 DIAGNOSIS — E785 Hyperlipidemia, unspecified: Secondary | ICD-10-CM

## 2015-07-27 DIAGNOSIS — D649 Anemia, unspecified: Secondary | ICD-10-CM | POA: Diagnosis not present

## 2015-07-27 DIAGNOSIS — M150 Primary generalized (osteo)arthritis: Secondary | ICD-10-CM

## 2015-07-27 DIAGNOSIS — K5903 Drug induced constipation: Secondary | ICD-10-CM

## 2015-07-27 DIAGNOSIS — K219 Gastro-esophageal reflux disease without esophagitis: Secondary | ICD-10-CM

## 2015-07-27 DIAGNOSIS — I110 Hypertensive heart disease with heart failure: Secondary | ICD-10-CM | POA: Diagnosis not present

## 2015-07-27 DIAGNOSIS — G8929 Other chronic pain: Secondary | ICD-10-CM

## 2015-07-27 DIAGNOSIS — I5032 Chronic diastolic (congestive) heart failure: Secondary | ICD-10-CM

## 2015-07-27 DIAGNOSIS — E1149 Type 2 diabetes mellitus with other diabetic neurological complication: Secondary | ICD-10-CM

## 2015-07-27 DIAGNOSIS — M159 Polyosteoarthritis, unspecified: Secondary | ICD-10-CM

## 2015-07-27 LAB — SEDIMENTATION RATE
CRP: 0.5 mg/dL
Sed Rate: 20

## 2015-07-27 MED ORDER — INSULIN ASPART 100 UNIT/ML ~~LOC~~ SOLN: 5.0000 [IU] | Freq: Three times a day (TID) | SUBCUTANEOUS | Status: DC

## 2015-07-27 MED ORDER — INSULIN GLARGINE 100 UNIT/ML SOLOSTAR PEN: 35.0000 [IU] | PEN_INJECTOR | Freq: Every day | SUBCUTANEOUS | Status: DC

## 2015-07-27 NOTE — Progress Notes (Signed)
[name], male   DOB: Zeitlin 15, 1931, 82 y.o.   MRN: [id number]   Location:  [facility] Room Number: 115-A Place of Service:  SNF (31)   CODE STATUS: DNR  No Known Allergies  Chief Complaint  Patient presents with  . Medical Management of Chronic Issues    Follow up    HPI:  He is a long term resident of this facility being seen for the management of his chronic illnesses. Overall there is little change in his status. He is not voicing any complaints; he tells me that he is feeling good. There are no nursing concerns at this time.    Past Medical History  Diagnosis Date  . 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 Hypertension   . Peripheral vascular disease (HCC)   . Depression   . Anxiety   . GERD (gastroesophageal reflux disease)   . Renal insufficiency   . Dyslipidemia   . DDD (degenerative disc disease), lumbosacral   . History of colon polyps   . History of MRSA infection     Hx chronic diabetic ulcer's secondary MRSA  . Ulcer of right ankle (HCC)   . Insulin dependent type 2 diabetes mellitus (HCC)   . History of diabetic ulcer of foot   . Diabetic peripheral neuropathy (HCC)   . Wheelchair dependent   . At high risk for falls   . Chronic diastolic congestive heart failure (HCC) 06/13/2012    Echo in 2011 with grade 1 diastolic dysfunction and normal systolic function with EF 60%.     . Portal hypertension (HCC) 01/15/2010    Qualifier: Diagnosis of  By: Everardo All MD, Sean A   . UNSPECIFIED PERIPHERAL VASCULAR DISEASE 01/15/2010    Qualifier: Diagnosis of  By: Everardo All MD, Cleophas Dunker     Past Surgical History  Procedure Laterality Date  . Laparoscopic cholecystectomy    . Inguinal hernia repair Left 11-30-2001  . Colonoscopy with esophagogastroduodenoscopy (egd)  09-21-2002  . Transthoracic echocardiogram  12-20-2009    severe LVH/  ef 60%/  grade I diastolic dysfunction/  AV sclerosis without stenosis/  mild LAE and  RAE/    . Toe amputation    . Incision and drainage of wound Right 01/10/2014    Procedure: IRRIGATION AND DEBRIDEMENT RIGHT ANKLE WOUND WITH PLACEMENT OF ACELL;  Surgeon: Wayland Denis, DO;  Location: [facility];  Service: Plastics;  Laterality: Right;    Social History   Social History  . Marital Status: Married    Spouse Name: N/A  . Number of Children: N/A  . Years of Education: N/A   Occupational History  .      Retired   Social History Main Topics  . Smoking status: Former Games developer  . Smokeless tobacco: Not on file  . Alcohol Use: No     Comment: hx alcohol abuse  in remission  . Drug Use: Not on file  . Sexual Activity: Not on file   Other Topics Concern  . Not on file   Social History Narrative   Lives at [facility]   Family History  Problem Relation Age of Onset  . Diabetes Neg Hx     No DM in immediate family      VITAL SIGNS BP 138/70 mmHg  Pulse 62  Temp(Src) 98 F (36.7 C) (Oral)  Resp 19  Ht 5\' 10"  (1.778 m)  Wt 272 lb 7 oz (123.577 kg)  BMI 39.09 kg/m2  SpO2 98%  Patient's Medications  New Prescriptions   No medications on file  Previous Medications   AMLODIPINE (NORVASC) 5 MG TABLET    Take 10 mg by mouth every morning.    ASPIRIN 81 MG TABLET    Take 81 mg by mouth every morning.    ATORVASTATIN (LIPITOR) 10 MG TABLET    Take 1 tablet (10 mg total) by mouth daily.   BUPROPION (WELLBUTRIN) 75 MG TABLET    Take 75 mg by mouth 2 (two) times daily.    CLONIDINE (CATAPRES) 0.1 MG TABLET    Take 0.1-0.2 mg by mouth 2 (two) times daily. 0.1 mg in the AM and 0.2 mg in the PM    COAL TAR (NEUTROGENA T-GEL) 0.5 % SHAMPOO    Apply topically. Apply topically every 96 hours for dandruff weekly as needed   DICLOFENAC SODIUM (VOLTAREN) 1 % GEL    Apply 2 g topically 3 (three) times daily. To right wrist   DORZOLAMIDE-TIMOLOL (COSOPT) 22.3-6.8 MG/ML OPHTHALMIC SOLUTION    Place 1 drop into both eyes 2 (two) times daily.    FENOFIBRATE (TRICOR) 145 MG TABLET    Take 145 mg by mouth every evening.    FUROSEMIDE (LASIX) 40 MG TABLET    Take 40 mg by mouth 2 (two) times daily.    GABAPENTIN (NEURONTIN) 100 MG CAPSULE    Take 100 mg by mouth 2 (two) times daily.   INSULIN ASPART (NOVOLOG) 100 UNIT/ML INJECTION    Inject as per sliding scale: if 0-149 =0 units; [id number] = 8 units, subcutaneously before meals related to DM.  Give 8 units if BS is greater than 150   INSULIN GLARGINE (LANTUS) 100 UNIT/ML INJECTION    Inject 28 Units into the skin at bedtime.    ISOSORBIDE MONONITRATE (IMDUR) 60 MG 24 HR TABLET    Take 60 mg by mouth every morning.    LATANOPROST (XALATAN) 0.005 % OPHTHALMIC SOLUTION    Place 1 drop into both eyes at bedtime.     LIDOCAINE (LIDODERM) 5 %    Place 1 patch onto the skin every 12 (twelve) hours. Remove & Discard patch within 12 hours to right neck  And to left hip   LISINOPRIL (PRINIVIL,ZESTRIL) 5 MG TABLET    Take 10 mg by mouth daily.    LORAZEPAM (ATIVAN) 0.5 MG TABLET    Take 0.5 mg by mouth daily. Prior to AM care   MAGNESIUM HYDROXIDE (MILK OF MAGNESIA) 400 MG/5ML SUSPENSION    Take 30 mLs by mouth daily as needed for mild constipation.   MEMANTINE HCL ER (NAMENDA XR) 14 MG CP24    Take 1 tablet by mouth every morning.   METOPROLOL (LOPRESSOR) 50 MG TABLET    Take 50 mg by mouth 2 (two) times daily.   NUTRITIONAL SUPPLEMENTS (NUTRITIONAL SUPPLEMENT PO)    Take by mouth. HSG Mech Soft Texture, for diet   OMEPRAZOLE (PRILOSEC) 20 MG CAPSULE    Take 40 mg by mouth every morning.    ONDANSETRON (ZOFRAN) 4 MG TABLET    Take 4 mg by mouth every 8 (eight) hours as needed for nausea or vomiting.   OXYCODONE (OXY-IR) 5 MG CAPSULE    Take one tablet by mouth every 8 hours as needed for pain.   OXYCODONE (OXYCONTIN) 30 MG 12 HR TABLET    Take one tablet by mouth every 12 hours for pain NO NOT CUT/CRUSH/CHEW   POLYETHYLENE GLYCOL  POWDER (GLYCOLAX/MIRALAX) POWDER    Take 17 g by mouth 2 (two) times  daily.    POTASSIUM CHLORIDE SA (KLOR-CON M20) 20 MEQ TABLET    Take 20 mEq by mouth 2 (two) times daily.   PROTEIN SUPPLEMENT (PROMOD) POWD    Take 1 scoop by mouth 2 (two) times daily.   SENNA (SENOKOT) 8.6 MG TABLET    Take 2 tablets by mouth 2 (two) times daily.    ZINC SULFATE 220 (50 ZN) MG CAPSULE    Take 220 mg by mouth daily.  Modified Medications   No medications on file  Discontinued Medications   No medications on file     SIGNIFICANT DIAGNOSTIC EXAMS  03-14-14: right ankle tib/fib mri: 1. 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 2. T2 hyperintense probable draining abscess deep to ulceration. 3. Ankle effusion. This is probably reactive. Septic arthritis is less likely. 4. Infectious tenosynovitis of the peroneal tendons. 5. No abscess in the proximal RIGHT leg.  06-08-14: right hand x-ray; mild osteoarthritis  06-08-14: right wrist x-ray; osteoarthritis right wrist  06-09-14: pelvic and left hip x-ray: mild degenerative disease left hip no significant change since 09-28-13.    LABS REVIEWED:   09-12-14: glucose 70; bun 24; creat 1.58; k+ 3.4; na++140  09-19-14: wbc 9.9; hgb 12.9; hct 37.8; mcv 90.4; plt 141; glucose 113; bun 28; creat 1.95; k+ 3.8; na++141; liver normal albumin 4.0; chol 157; ldl 91; trig 200; hdl 26; hgb a1c 6.1  12-09-14: glucose 159; bun 19; creat 1.44; k+ 3.4; na++139; hgb a1c 6.2 12-26-14: k+ 3.6  04-26-15: wbc 9.4; hgb 10.8; hct 32.4; mcv 92.0; plt 108; glucose 188; bun 29; creat 1.93; k+ 3.8; na++142; liver normal albumin 3.6; chol 98; ldl 39; trig 179; hdl 23; hgb a1c 6.8 urine micro-albumin: 55.8 05-08-15: hgb a1c 6.9  06-14-15; sed rate 20; CRP <0.5     Review of Systems Constitutional: Negative for appetite change and fatigue.  HENT: Negative for congestion.   Respiratory: Negative for cough, chest tightness and shortness of breath.   Cardiovascular: Negative for chest pain, palpitations and leg  swelling.  Gastrointestinal: Negative for nausea, abdominal pain, diarrhea and constipation.  Musculoskeletal: Negative for myalgias and arthralgias.  Skin: no complaints     Neurological: Negative for dizziness.  Psychiatric/Behavioral: The patient is not nervous/anxious.       Physical Exam Constitutional: No distress.  Overweight   Eyes: Conjunctivae are normal.  Neck: Neck supple. No JVD present. No thyromegaly present.  Cardiovascular: Normal rate, regular rhythm and intact distal pulses.   Respiratory: Effort normal and breath sounds normal. No respiratory distress. He has no wheezes.  GI: Soft. Bowel sounds are normal. He exhibits no distension. There is no tenderness.  Musculoskeletal: He exhibits no edema.  Able to move all extremities   Lymphadenopathy:    He has no cervical adenopathy.  Neurological: He is alert.  Skin: Skin is warm and dry. He is not diaphoretic.  Right ankle shearing: 0.3 x 0.3 x 0.1 cm 20% eschar in wound bed;  no signs of infection present will continue collagen    Psychiatric: He has a normal mood and affect.       ASSESSMENT/ PLAN:   1. Hypertension: will continue lopressor 50 mg twice daily; norvasc 10 mg daily asa 81 mg daily; will continue  lisinopril  10 mg daily   Will continue clonidine 0.1 mg in the AM and 0.2 in the PM will  monitor   2. Chronic diastolic heart failure: will continue lasix 40 mg twice daily lopressor 50 mg twice daily imdur 60 mg daily  2011: EF 60%   3. Constipation: will continue miralax twice daily; senna 2 tabs twice daily   4. Hypokalemia will continue  k+ 20 meq twice daily k+ is 3.8  5. GERD: will continue prilosec 40 mg daily   6. Dyslipidemia: will continue tricor 145 mg daily; will continue  lipitor 10 mg daily; ldl is 39; trig is 179  7. Diabetes:  hgb a1c is 6.9 his cbg's are not controlled; will increase lantus to 32 units nightly and will give novolog 5 units with meals with and additional 8 units  for cbg >=150  8. Depression with anxiety will continue wellbutrin  75 mg twice daily; ativan 0.5 mg daily prior to AM care   for anxiety and will monitor  9. Vascular dementia: is without change; will continue namenda xr 14 mg daily and will monitor   10. Chronic pain: his pain is presently being managed will continue oxycontin 30 mg twice daily; neurontin 100 mg twice daily; lidoderm patch to his right neck daily and left hip; voltaren gel 2 gm to right wrist three times daily  and oxycodone 5 mg every 8 hours as needed   12. Glaucoma; will continue latanoprost to both eyes nightly and cosopt to both eyes twice daily .   13. Anemia: his hgb is 10.8; will monitor   14. Diabetic peripheral neuropathy: will continue neurontin 100 mg twice daily       Synthia Innocenteborah Laurieanne Galloway NP [facility]  Contact [phone] Monday through Friday 8am- 5pm  After hours call [phone]

## 2015-08-28 ENCOUNTER — Encounter: Payer: Self-pay | Admitting: Adult Health

## 2015-08-28 ENCOUNTER — Non-Acute Institutional Stay (SKILLED_NURSING_FACILITY): Payer: Medicare Other | Admitting: Adult Health

## 2015-08-28 DIAGNOSIS — K5903 Drug induced constipation: Secondary | ICD-10-CM | POA: Diagnosis not present

## 2015-08-28 DIAGNOSIS — E1149 Type 2 diabetes mellitus with other diabetic neurological complication: Secondary | ICD-10-CM | POA: Diagnosis not present

## 2015-08-28 DIAGNOSIS — E1142 Type 2 diabetes mellitus with diabetic polyneuropathy: Secondary | ICD-10-CM

## 2015-08-28 DIAGNOSIS — D649 Anemia, unspecified: Secondary | ICD-10-CM | POA: Diagnosis not present

## 2015-08-28 DIAGNOSIS — I110 Hypertensive heart disease with heart failure: Secondary | ICD-10-CM

## 2015-08-28 DIAGNOSIS — F0150 Vascular dementia without behavioral disturbance: Secondary | ICD-10-CM

## 2015-08-28 DIAGNOSIS — E1169 Type 2 diabetes mellitus with other specified complication: Secondary | ICD-10-CM | POA: Diagnosis not present

## 2015-08-28 DIAGNOSIS — I5032 Chronic diastolic (congestive) heart failure: Secondary | ICD-10-CM | POA: Diagnosis not present

## 2015-08-28 DIAGNOSIS — E785 Hyperlipidemia, unspecified: Secondary | ICD-10-CM | POA: Diagnosis not present

## 2015-08-28 DIAGNOSIS — G8929 Other chronic pain: Secondary | ICD-10-CM | POA: Diagnosis not present

## 2015-08-28 NOTE — Progress Notes (Signed)
[name], male   DOB: 01-19-1930, 82 y.o.   MRN: [id number]   [facility] Room Number: 115-A Place of Service:  SNF (31)   CODE STATUS: DNR  No Known Allergies  Chief Complaint  Patient presents with  . Medical Management of Chronic Issues    Follow up    HPI:  He is a long term resident of this facility being seen for the management of his chronic illnesses. Overall there is little change in his status. He tells me that he is feeling good and has not complaints. There are no nursing concerns at this time.   Past Medical History  Diagnosis Date  . 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 Hypertension   . Peripheral vascular disease (HCC)   . Depression   . Anxiety   . GERD (gastroesophageal reflux disease)   . Renal insufficiency   . Dyslipidemia   . DDD (degenerative disc disease), lumbosacral   . History of colon polyps   . History of MRSA infection     Hx chronic diabetic ulcer's secondary MRSA  . Ulcer of right ankle (HCC)   . Insulin dependent type 2 diabetes mellitus (HCC)   . History of diabetic ulcer of foot   . Diabetic peripheral neuropathy (HCC)   . Wheelchair dependent   . At high risk for falls   . Chronic diastolic congestive heart failure (HCC) 06/13/2012    Echo in 2011 with grade 1 diastolic dysfunction and normal systolic function with EF 60%.     . Portal hypertension (HCC) 01/15/2010    Qualifier: Diagnosis of  By: Everardo AllEllison MD, Sean A   . UNSPECIFIED PERIPHERAL VASCULAR DISEASE 01/15/2010    Qualifier: Diagnosis of  By: Everardo AllEllison MD, Cleophas DunkerSean A     Past Surgical History  Procedure Laterality Date  . Laparoscopic cholecystectomy    . Inguinal hernia repair Left 11-30-2001  . Colonoscopy with esophagogastroduodenoscopy (egd)  09-21-2002  . Transthoracic echocardiogram  12-20-2009    severe LVH/  ef 60%/  grade I diastolic dysfunction/  AV sclerosis without stenosis/  mild LAE and RAE/    .  Toe amputation    . Incision and drainage of wound Right 01/10/2014    Procedure: IRRIGATION AND DEBRIDEMENT RIGHT ANKLE WOUND WITH PLACEMENT OF ACELL;  Surgeon: Wayland Denislaire [name], DO;  Location: [facility];  Service: Plastics;  Laterality: Right;    Social History   Social History  . Marital Status: Married    Spouse Name: N/A  . Number of Children: N/A  . Years of Education: N/A   Occupational History  .      Retired   Social History Main Topics  . Smoking status: Former Games developermoker  . Smokeless tobacco: Not on file  . Alcohol Use: No     Comment: hx alcohol abuse  in remission  . Drug Use: Not on file  . Sexual Activity: Not on file   Other Topics Concern  . Not on file   Social History Narrative   [facility]   Family History  Problem Relation Age of Onset  . Diabetes Neg Hx     No DM in immediate family      VITAL SIGNS BP 165/70 mmHg  Pulse 59  Temp(Src) 97.7 F (36.5 C) (Oral)  Resp 18  Ht 5\' 10"  (1.778 m)  Wt 242 lb (109.77 kg)  BMI 34.72 kg/m2  SpO2 97%  Patient's Medications  New Prescriptions   No medications on file  Previous Medications   AMLODIPINE (NORVASC) 5 MG TABLET    Take 10 mg by mouth every morning.    ASCORBIC ACID (VITAMIN C) 500 MG TABLET    Take 500 mg by mouth 2 (two) times daily.   ASPIRIN 81 MG TABLET    Take 81 mg by mouth every morning.    ATORVASTATIN (LIPITOR) 10 MG TABLET    Take 1 tablet (10 mg total) by mouth daily.   BUPROPION (WELLBUTRIN) 75 MG TABLET    Take 75 mg by mouth 2 (two) times daily.    CLONIDINE (CATAPRES) 0.1 MG TABLET    Take 0.1-0.2 mg by mouth 2 (two) times daily. Reported on 07/27/2015   COAL TAR (NEUTROGENA T-GEL) 0.5 % SHAMPOO    Apply topically. Apply topically every 96 hours for dandruff weekly as needed   DICLOFENAC SODIUM (VOLTAREN) 1 % GEL    Apply 2 g topically 3 (three) times daily. To right wrist   DORZOLAMIDE-TIMOLOL (COSOPT) 22.3-6.8 MG/ML OPHTHALMIC SOLUTION     Place 1 drop into both eyes 2 (two) times daily.   FENOFIBRATE (TRICOR) 145 MG TABLET    Take 145 mg by mouth every evening.    FUROSEMIDE (LASIX) 40 MG TABLET    Take 40 mg by mouth 2 (two) times daily.    GABAPENTIN (NEURONTIN) 100 MG CAPSULE    Take 100 mg by mouth 2 (two) times daily.   INSULIN ASPART (NOVOLOG) 100 UNIT/ML INJECTION    Inject 5-13 Units into the skin 3 (three) times daily before meals. Give 5 units with meals with an additional 8 units for the cbg >=150   INSULIN GLARGINE (LANTUS SOLOSTAR) 100 UNIT/ML SOLOSTAR PEN    Inject 35 Units into the skin daily at 10 pm.   ISOSORBIDE MONONITRATE (IMDUR) 60 MG 24 HR TABLET    Take 60 mg by mouth every morning.    LATANOPROST (XALATAN) 0.005 % OPHTHALMIC SOLUTION    Place 1 drop into both eyes at bedtime.     LIDOCAINE (LIDODERM) 5 %    Place 1 patch onto the skin every 12 (twelve) hours. Remove & Discard patch within 12 hours to right neck  And to left hip   LISINOPRIL (PRINIVIL,ZESTRIL) 5 MG TABLET    Take 10 mg by mouth daily.    LORAZEPAM (ATIVAN) 0.5 MG TABLET    Take 0.5 mg by mouth daily. Prior to AM care   MEMANTINE HCL ER (NAMENDA XR) 14 MG CP24    Take 1 tablet by mouth every morning.   METOPROLOL (LOPRESSOR) 50 MG TABLET    Take 50 mg by mouth 2 (two) times daily.   NUTRITIONAL SUPPLEMENTS (NUTRITIONAL SUPPLEMENT PO)    Take by mouth. HSG Mech Soft Texture, for diet   NYSTATIN (MYCOSTATIN/NYSTOP) POWDER    Apply topically 2 (two) times daily.   OMEPRAZOLE (PRILOSEC) 20 MG CAPSULE    Take 40 mg by mouth every morning.    ONDANSETRON (ZOFRAN) 4 MG TABLET    Take 4 mg by mouth every 8 (eight) hours as needed for nausea or vomiting.   OXYCODONE (OXY-IR) 5 MG CAPSULE    Take one tablet by mouth every 8 hours as needed for pain.   OXYCODONE (OXYCONTIN) 30 MG 12 HR TABLET    Take one tablet by mouth every 12 hours for pain NO NOT CUT/CRUSH/CHEW   POLYETHYLENE GLYCOL POWDER (GLYCOLAX/MIRALAX)  POWDER    Take 17 g by mouth 2 (two) times  daily.    POTASSIUM CHLORIDE SA (KLOR-CON M20) 20 MEQ TABLET    Take 20 mEq by mouth 2 (two) times daily.   PROTEIN SUPPLEMENT (PROMOD) POWD    Take 1 scoop by mouth 2 (two) times daily.   SENNA (SENOKOT) 8.6 MG TABLET    Take 2 tablets by mouth 2 (two) times daily.   Modified Medications   No medications on file  Discontinued Medications   MAGNESIUM HYDROXIDE (MILK OF MAGNESIA) 400 MG/5ML SUSPENSION    Take 30 mLs by mouth daily as needed for mild constipation. Reported on 08/28/2015     SIGNIFICANT DIAGNOSTIC EXAMS  03-14-14: right ankle tib/fib mri: 1. 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 2. T2 hyperintense probable draining abscess deep to ulceration. 3. Ankle effusion. This is probably reactive. Septic arthritis is less likely. 4. Infectious tenosynovitis of the peroneal tendons. 5. No abscess in the proximal RIGHT leg.  06-08-14: right hand x-ray; mild osteoarthritis  06-08-14: right wrist x-ray; osteoarthritis right wrist  06-09-14: pelvic and left hip x-ray: mild degenerative disease left hip no significant change since 09-28-13.    LABS REVIEWED:   09-12-14: glucose 70; bun 24; creat 1.58; k+ 3.4; na++140  09-19-14: wbc 9.9; hgb 12.9; hct 37.8; mcv 90.4; plt 141; glucose 113; bun 28; creat 1.95; k+ 3.8; na++141; liver normal albumin 4.0; chol 157; ldl 91; trig 200; hdl 26; hgb a1c 6.1  12-09-14: glucose 159; bun 19; creat 1.44; k+ 3.4; na++139; hgb a1c 6.2 12-26-14: k+ 3.6  04-26-15: wbc 9.4; hgb 10.8; hct 32.4; mcv 92.0; plt 108; glucose 188; bun 29; creat 1.93; k+ 3.8; na++142; liver normal albumin 3.6; chol 98; ldl 39; trig 179; hdl 23; hgb a1c 6.8 urine micro-albumin: 55.8 05-08-15: hgb a1c 6.9  06-14-15; sed rate 20; CRP <0.5     Review of Systems Constitutional: Negative for appetite change and fatigue.  HENT: Negative for congestion.   Respiratory: Negative for cough, chest tightness and shortness of breath.     Cardiovascular: Negative for chest pain, palpitations and leg swelling.  Gastrointestinal: Negative for nausea, abdominal pain, diarrhea and constipation.  Musculoskeletal: Negative for myalgias and arthralgias.  Skin: no complaints     Neurological: Negative for dizziness.  Psychiatric/Behavioral: The patient is not nervous/anxious.       Physical Exam Constitutional: No distress.  Overweight   Eyes: Conjunctivae are normal.  Neck: Neck supple. No JVD present. No thyromegaly present.  Cardiovascular: Normal rate, regular rhythm and intact distal pulses.   Respiratory: Effort normal and breath sounds normal. No respiratory distress. He has no wheezes.  GI: Soft. Bowel sounds are normal. He exhibits no distension. There is no tenderness.  Musculoskeletal: He exhibits no edema.  Able to move all extremities   Lymphadenopathy:    He has no cervical adenopathy.  Neurological: He is alert.  Skin: Skin is warm and dry. He is not diaphoretic.    Psychiatric: He has a normal mood and affect.       ASSESSMENT/ PLAN:   1. Hypertension: will continue lopressor 50 mg twice daily; norvasc 10 mg daily asa 81 mg daily; will continue  lisinopril  10 mg daily   Will continue clonidine 0.1 mg in the AM and 0.2 in the PM will monitor   2. Chronic diastolic heart failure: will continue lasix 40 mg twice daily lopressor 50 mg twice daily imdur 60  mg daily  2011: EF 60%   3. Constipation: will continue miralax twice daily; senna 2 tabs twice daily   4. Hypokalemia will continue  k+ 20 meq twice daily k+ is 3.8  5. GERD: will continue prilosec 40 mg daily   6. Dyslipidemia: will continue tricor 145 mg daily; will continue  lipitor 10 mg daily; ldl is 39; trig is 179  7. Diabetes:  hgb a1c is 6.9 his cbg's are not controlled; will increase lantus to 32 units nightly and will give novolog 5 units with meals with and additional 8 units for cbg >=150  8. Depression with anxiety will continue  wellbutrin  75 mg twice daily; ativan 0.5 mg daily prior to AM care   for anxiety and will monitor  9. Vascular dementia: is without change; will continue namenda xr 14 mg daily and will monitor   10. Chronic pain: his pain is presently being managed will continue oxycontin 30 mg twice daily; neurontin 100 mg twice daily; lidoderm patch to his right neck daily and left hip; voltaren gel 2 gm to right wrist three times daily  and oxycodone 5 mg every 8 hours as needed   12. Glaucoma; will continue latanoprost to both eyes nightly and cosopt to both eyes twice daily .   13. Anemia: his hgb is 10.8; will monitor   14. Diabetic peripheral neuropathy: will continue neurontin 100 mg twice daily     Will check hgb a1c    Synthia Innocenteborah Sonika Levins NP [facility]  Contact [phone] Monday through Friday 8am- 5pm  After hours call [phone]

## 2015-08-30 LAB — HEMOGLOBIN A1C: HEMOGLOBIN A1C: 6.2

## 2015-09-11 ENCOUNTER — Telehealth: Payer: Self-pay | Admitting: *Deleted

## 2015-09-11 NOTE — Telephone Encounter (Signed)
Received fax from [employer] Claim Detail. Case No: DUR-SM: [id number] [facility] [place].

## 2015-09-28 ENCOUNTER — Encounter: Payer: Self-pay | Admitting: Adult Health

## 2015-09-28 ENCOUNTER — Non-Acute Institutional Stay (SKILLED_NURSING_FACILITY): Payer: Medicare Other | Admitting: Adult Health

## 2015-09-28 DIAGNOSIS — E1149 Type 2 diabetes mellitus with other diabetic neurological complication: Secondary | ICD-10-CM | POA: Diagnosis not present

## 2015-09-28 DIAGNOSIS — E1169 Type 2 diabetes mellitus with other specified complication: Secondary | ICD-10-CM | POA: Diagnosis not present

## 2015-09-28 DIAGNOSIS — N184 Chronic kidney disease, stage 4 (severe): Secondary | ICD-10-CM

## 2015-09-28 DIAGNOSIS — E1142 Type 2 diabetes mellitus with diabetic polyneuropathy: Secondary | ICD-10-CM

## 2015-09-28 DIAGNOSIS — E785 Hyperlipidemia, unspecified: Secondary | ICD-10-CM

## 2015-09-28 DIAGNOSIS — I5032 Chronic diastolic (congestive) heart failure: Secondary | ICD-10-CM | POA: Diagnosis not present

## 2015-09-28 DIAGNOSIS — I110 Hypertensive heart disease with heart failure: Secondary | ICD-10-CM

## 2015-09-28 DIAGNOSIS — F0150 Vascular dementia without behavioral disturbance: Secondary | ICD-10-CM | POA: Diagnosis not present

## 2015-09-28 DIAGNOSIS — G8929 Other chronic pain: Secondary | ICD-10-CM | POA: Diagnosis not present

## 2015-09-28 NOTE — Progress Notes (Signed)
[name], male   DOB: 22-Dec-1929, 82 y.o.   MRN: [id number]    Location:   [facility] Room Number: 115-A Place of Service:  SNF (31)   CODE STATUS: DNR  No Known Allergies  Chief Complaint  Patient presents with  . Medical Management of Chronic Issues    Follow up    HPI:  He is a long term resident of this facility being seen for the management of his chronic illnesses. Staff reports that for the past several days he has had increased confusion present. He tells me that he is ready go home. He does not know where he is . There are no reports of fever present.   Past Medical History:  Diagnosis Date  . 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 Anxiety   . At high risk for falls   . Chronic diastolic congestive heart failure (HCC) 06/13/2012   Echo in 2011 with grade 1 diastolic dysfunction and normal systolic function with EF 60%.     . DDD (degenerative disc disease), lumbosacral   . Depression   . Diabetic peripheral neuropathy (HCC)   . Dyslipidemia   . GERD (gastroesophageal reflux disease)   . History of colon polyps   . History of diabetic ulcer of foot   . History of MRSA infection    Hx chronic diabetic ulcer's secondary MRSA  . Hypertension   . Insulin dependent type 2 diabetes mellitus (HCC)   . Peripheral vascular disease (HCC)   . Portal hypertension (HCC) 01/15/2010   Qualifier: Diagnosis of  By: Everardo All MD, Cleophas Dunker   . Renal insufficiency   . Ulcer of right ankle (HCC)   . UNSPECIFIED PERIPHERAL VASCULAR DISEASE 01/15/2010   Qualifier: Diagnosis of  By: Everardo All MD, Cleophas Dunker   . Wheelchair dependent     Past Surgical History:  Procedure Laterality Date  . COLONOSCOPY WITH ESOPHAGOGASTRODUODENOSCOPY (EGD)  09-21-2002  . INCISION AND DRAINAGE OF WOUND Right 01/10/2014   Procedure: IRRIGATION AND DEBRIDEMENT RIGHT ANKLE WOUND WITH PLACEMENT OF ACELL;  Surgeon: Wayland Denis, DO;  Location: [facility];  Service:  Plastics;  Laterality: Right;  . INGUINAL HERNIA REPAIR Left 11-30-2001  . LAPAROSCOPIC CHOLECYSTECTOMY    . TOE AMPUTATION    . TRANSTHORACIC ECHOCARDIOGRAM  12-20-2009   severe LVH/  ef 60%/  grade I diastolic dysfunction/  AV sclerosis without stenosis/  mild LAE and RAE/      Social History   Social History  . Marital status: Married    Spouse name: N/A  . Number of children: N/A  . Years of education: N/A   Occupational History  .  Retired    Retired   Social History Main Topics  . Smoking status: Former Games developer  . Smokeless tobacco: Not on file  . Alcohol use No     Comment: hx alcohol abuse  in remission  . Drug use: Unknown  . Sexual activity: Not on file   Other Topics Concern  . Not on file   Social History Narrative   Lives at [facility]   Family History  Problem Relation Age of Onset  . Diabetes Neg Hx     No DM in immediate family      VITAL SIGNS BP 140/74   Pulse 60   Temp 98 F (36.7 C) (Oral)   Resp 20   Ht 5\' 10"  (1.778 m)   Wt 238 lb 8 oz (  108.2 kg)   SpO2 97%   BMI 34.22 kg/m   Patient's Medications  New Prescriptions   No medications on file  Previous Medications   AMLODIPINE (NORVASC) 5 MG TABLET    Take 10 mg by mouth every morning.    ASCORBIC ACID (VITAMIN C) 500 MG TABLET    Take 500 mg by mouth 2 (two) times daily.   ASPIRIN 81 MG TABLET    Take 81 mg by mouth every morning.    ATORVASTATIN (LIPITOR) 10 MG TABLET    Take 1 tablet (10 mg total) by mouth daily.   BUPROPION (WELLBUTRIN) 75 MG TABLET    Take 75 mg by mouth 2 (two) times daily.    CLONIDINE (CATAPRES) 0.1 MG TABLET    Take 0.1-0.2 mg by mouth 2 (two) times daily. Reported on 07/27/2015   COAL TAR (NEUTROGENA T-GEL) 0.5 % SHAMPOO    Apply topically. Apply topically every 96 hours for dandruff weekly as needed   DICLOFENAC SODIUM (VOLTAREN) 1 % GEL    Apply 2 g topically 3 (three) times daily. To right wrist   DORZOLAMIDE-TIMOLOL (COSOPT) 22.3-6.8 MG/ML  OPHTHALMIC SOLUTION    Place 1 drop into both eyes 2 (two) times daily.   FENOFIBRATE (TRICOR) 145 MG TABLET    Take 145 mg by mouth every evening.    FUROSEMIDE (LASIX) 40 MG TABLET    Take 40 mg by mouth 2 (two) times daily.    GABAPENTIN (NEURONTIN) 100 MG CAPSULE    Take 100 mg by mouth 2 (two) times daily.   INSULIN ASPART (NOVOLOG) 100 UNIT/ML INJECTION    Inject 5 Units into the skin 3 (three) times daily before meals. Inject as per sliding scale: if  0-150 =0 units; 151-450 = 8 units, subcutaneously before meals related to DM. Call MD for BS> 450   INSULIN GLARGINE (LANTUS SOLOSTAR) 100 UNIT/ML SOLOSTAR PEN    Inject 35 Units into the skin daily at 10 pm.   ISOSORBIDE MONONITRATE (IMDUR) 60 MG 24 HR TABLET    Take 60 mg by mouth every morning.    LATANOPROST (XALATAN) 0.005 % OPHTHALMIC SOLUTION    Place 1 drop into both eyes at bedtime.     LIDOCAINE (LIDODERM) 5 %    Place 1 patch onto the skin every 12 (twelve) hours. Remove & Discard patch within 12 hours to right neck  And to left hip   LISINOPRIL (PRINIVIL,ZESTRIL) 5 MG TABLET    Take 10 mg by mouth daily.    LORAZEPAM (ATIVAN) 0.5 MG TABLET    Take 0.5 mg by mouth daily. Prior to AM care   MEMANTINE HCL ER (NAMENDA XR) 14 MG CP24    Take 1 tablet by mouth every morning.   METOPROLOL (LOPRESSOR) 50 MG TABLET    Take 50 mg by mouth 2 (two) times daily.   NUTRITIONAL SUPPLEMENTS (NUTRITIONAL SUPPLEMENT PO)    Take by mouth. HSG Mech Soft Texture, for diet   NYSTATIN (MYCOSTATIN/NYSTOP) POWDER    Apply topically 2 (two) times daily.   OMEPRAZOLE (PRILOSEC) 20 MG CAPSULE    Take 40 mg by mouth every morning.    ONDANSETRON (ZOFRAN) 4 MG TABLET    Take 4 mg by mouth every 8 (eight) hours as needed for nausea or vomiting.   OXYCODONE (OXY-IR) 5 MG CAPSULE    Take one tablet by mouth every 8 hours as needed for pain.   OXYCODONE (OXYCONTIN) 30 MG 12 HR TABLET  Take one tablet by mouth every 12 hours for pain NO NOT CUT/CRUSH/CHEW    POLYETHYLENE GLYCOL POWDER (GLYCOLAX/MIRALAX) POWDER    Take 17 g by mouth 2 (two) times daily.    POTASSIUM CHLORIDE (KLOR-CON) 20 MEQ PACKET    Take 20 mEq by mouth 2 (two) times daily.   POTASSIUM CHLORIDE SA (KLOR-CON M20) 20 MEQ TABLET    Take 20 mEq by mouth 2 (two) times daily.   PROTEIN SUPPLEMENT (PROMOD) POWD    Take 1 scoop by mouth 2 (two) times daily.   SENNA (SENOKOT) 8.6 MG TABLET    Take 2 tablets by mouth 2 (two) times daily.   Modified Medications   No medications on file  Discontinued Medications   INSULIN ASPART (NOVOLOG) 100 UNIT/ML INJECTION    Inject 5-13 Units into the skin 3 (three) times daily before meals. Give 5 units with meals with an additional 8 units for the cbg >=150     SIGNIFICANT DIAGNOSTIC EXAMS  03-14-14: right ankle tib/fib mri: 1. 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 2. T2 hyperintense probable draining abscess deep to ulceration. 3. Ankle effusion. This is probably reactive. Septic arthritis is less likely. 4. Infectious tenosynovitis of the peroneal tendons. 5. No abscess in the proximal RIGHT leg.  06-08-14: right hand x-ray; mild osteoarthritis  06-08-14: right wrist x-ray; osteoarthritis right wrist  06-09-14: pelvic and left hip x-ray: mild degenerative disease left hip no significant change since 09-28-13.    LABS REVIEWED:   12-09-14: glucose 159; bun 19; creat 1.44; k+ 3.4; na++139; hgb a1c 6.2 12-26-14: k+ 3.6  04-26-15: wbc 9.4; hgb 10.8; hct 32.4; mcv 92.0; plt 108; glucose 188; bun 29; creat 1.93; k+ 3.8; na++142; liver normal albumin 3.6; chol 98; ldl 39; trig 179; hdl 23; hgb a1c 6.8 urine micro-albumin: 55.8 05-08-15: hgb a1c 6.9  06-14-15; sed rate 20; CRP <0.5 08-30-15: hgb a1c 6.2      Review of Systems Constitutional: Negative for appetite change and fatigue.  HENT: Negative for congestion.   Respiratory: Negative for cough, chest tightness and shortness of breath.     Cardiovascular: Negative for chest pain, palpitations and leg swelling.  Gastrointestinal: Negative for nausea, abdominal pain, diarrhea and constipation.  Musculoskeletal: Negative for myalgias and arthralgias.  Skin: no complaints     Neurological: Negative for dizziness.  Psychiatric/Behavioral: The patient is not nervous/anxious.       Physical Exam Constitutional: No distress.  Overweight   Eyes: Conjunctivae are normal.  Neck: Neck supple. No JVD present. No thyromegaly present.  Cardiovascular: Normal rate, regular rhythm and intact distal pulses.   Respiratory: Effort normal and breath sounds normal. No respiratory distress. He has no wheezes.  GI: Soft. Bowel sounds are normal. He exhibits no distension. There is no tenderness.  Musculoskeletal: He exhibits no edema.  Able to move all extremities   Lymphadenopathy:    He has no cervical adenopathy.  Neurological: He is alert.  Skin: Skin is warm and dry. He is not diaphoretic.    Psychiatric: He has a normal mood and affect.       ASSESSMENT/ PLAN:  1. Hypertension: will continue lopressor 50 mg twice daily; norvasc 10 mg daily asa 81 mg daily; will continue  lisinopril  10 mg daily   Will continue clonidine 0.1 mg in the AM and 0.2 in the PM will monitor   2. Chronic diastolic heart failure: will continue lasix 40 mg twice daily lopressor  50 mg twice daily imdur 60 mg daily  2011: EF 60%   3. Constipation: will continue miralax twice daily; senna 2 tabs twice daily   4. Hypokalemia will continue  k+ 20 meq twice daily k+ is 3.8  5. GERD: will continue prilosec 40 mg daily   6. Dyslipidemia: will continue tricor 145 mg daily; will continue  lipitor 10 mg daily; ldl is 39; trig is 179  7. Diabetes:  hgb a1c is 6.2 his cbg's are not controlled; will continue  lantus  35 units nightly and  novolog 5 units with meals with and additional 3 units for cbg >=150  8. Depression with anxiety will continue wellbutrin  75  mg twice daily; ativan 0.5 mg daily prior to AM care   for anxiety and will monitor  9. Vascular dementia: is without change; will continue namenda xr 14 mg daily and will monitor   10. Chronic pain: his pain is presently being managed will continue oxycontin 30 mg twice daily; neurontin 100 mg twice daily; lidoderm patch to his right neck daily and left hip; voltaren gel 2 gm to right wrist three times daily  and oxycodone 5 mg every 8 hours as needed   12. Glaucoma; will continue latanoprost to both eyes nightly and cosopt to both eyes twice daily .   13. Anemia: his hgb is 10.8; will monitor   14. Diabetic peripheral neuropathy: will continue neurontin 100 mg twice daily   15. Increased confusion: will check cbc; cmp; ua/c&s   Synthia Innocent NP [facility]  Contact [phone] Monday through Friday 8am- 5pm  After hours call [phone]

## 2015-10-02 LAB — CBC AND DIFFERENTIAL
HCT: 33 % — AB (ref 41–53)
HEMOGLOBIN: 10.9 g/dL — AB (ref 13.5–17.5)
Platelets: 170 10*3/uL (ref 150–399)
WBC: 11.4 10^3/mL

## 2015-10-12 ENCOUNTER — Emergency Department (HOSPITAL_COMMUNITY): Payer: Medicare Other

## 2015-10-12 ENCOUNTER — Inpatient Hospital Stay (HOSPITAL_COMMUNITY)
Admission: EM | Admit: 2015-10-12 | Discharge: 2015-10-14 | DRG: 536 | Disposition: A | Discharge status: Skilled Nursing Facility | Payer: Medicare Other | Attending: Family Medicine | Admitting: Family Medicine

## 2015-10-12 ENCOUNTER — Encounter (HOSPITAL_COMMUNITY): Payer: Self-pay | Admitting: Emergency Medicine

## 2015-10-12 DIAGNOSIS — I2510 Atherosclerotic heart disease of native coronary artery without angina pectoris: Secondary | ICD-10-CM | POA: Diagnosis present

## 2015-10-12 DIAGNOSIS — K5900 Constipation, unspecified: Secondary | ICD-10-CM | POA: Diagnosis present

## 2015-10-12 DIAGNOSIS — E1142 Type 2 diabetes mellitus with diabetic polyneuropathy: Secondary | ICD-10-CM | POA: Diagnosis present

## 2015-10-12 DIAGNOSIS — F0150 Vascular dementia without behavioral disturbance: Secondary | ICD-10-CM | POA: Diagnosis present

## 2015-10-12 DIAGNOSIS — Z794 Long term (current) use of insulin: Secondary | ICD-10-CM

## 2015-10-12 DIAGNOSIS — S52501A Unspecified fracture of the lower end of right radius, initial encounter for closed fracture: Secondary | ICD-10-CM | POA: Diagnosis present

## 2015-10-12 DIAGNOSIS — E1151 Type 2 diabetes mellitus with diabetic peripheral angiopathy without gangrene: Secondary | ICD-10-CM | POA: Diagnosis present

## 2015-10-12 DIAGNOSIS — H409 Unspecified glaucoma: Secondary | ICD-10-CM | POA: Diagnosis present

## 2015-10-12 DIAGNOSIS — I5032 Chronic diastolic (congestive) heart failure: Secondary | ICD-10-CM | POA: Diagnosis present

## 2015-10-12 DIAGNOSIS — F418 Other specified anxiety disorders: Secondary | ICD-10-CM | POA: Diagnosis present

## 2015-10-12 DIAGNOSIS — I130 Hypertensive heart and chronic kidney disease with heart failure and stage 1 through stage 4 chronic kidney disease, or unspecified chronic kidney disease: Secondary | ICD-10-CM | POA: Diagnosis present

## 2015-10-12 DIAGNOSIS — E1122 Type 2 diabetes mellitus with diabetic chronic kidney disease: Secondary | ICD-10-CM | POA: Diagnosis present

## 2015-10-12 DIAGNOSIS — I110 Hypertensive heart disease with heart failure: Secondary | ICD-10-CM | POA: Diagnosis present

## 2015-10-12 DIAGNOSIS — E1169 Type 2 diabetes mellitus with other specified complication: Secondary | ICD-10-CM | POA: Diagnosis present

## 2015-10-12 DIAGNOSIS — R0902 Hypoxemia: Secondary | ICD-10-CM | POA: Diagnosis present

## 2015-10-12 DIAGNOSIS — D72829 Elevated white blood cell count, unspecified: Secondary | ICD-10-CM | POA: Diagnosis present

## 2015-10-12 DIAGNOSIS — Z79899 Other long term (current) drug therapy: Secondary | ICD-10-CM | POA: Diagnosis not present

## 2015-10-12 DIAGNOSIS — G8929 Other chronic pain: Secondary | ICD-10-CM | POA: Diagnosis present

## 2015-10-12 DIAGNOSIS — W050XXA Fall from non-moving wheelchair, initial encounter: Secondary | ICD-10-CM | POA: Diagnosis present

## 2015-10-12 DIAGNOSIS — Z7982 Long term (current) use of aspirin: Secondary | ICD-10-CM

## 2015-10-12 DIAGNOSIS — W19XXXA Unspecified fall, initial encounter: Secondary | ICD-10-CM

## 2015-10-12 DIAGNOSIS — E876 Hypokalemia: Secondary | ICD-10-CM | POA: Diagnosis present

## 2015-10-12 DIAGNOSIS — N184 Chronic kidney disease, stage 4 (severe): Secondary | ICD-10-CM | POA: Diagnosis present

## 2015-10-12 DIAGNOSIS — K219 Gastro-esophageal reflux disease without esophagitis: Secondary | ICD-10-CM | POA: Diagnosis present

## 2015-10-12 DIAGNOSIS — S72001A Fracture of unspecified part of neck of right femur, initial encounter for closed fracture: Principal | ICD-10-CM | POA: Diagnosis present

## 2015-10-12 DIAGNOSIS — E785 Hyperlipidemia, unspecified: Secondary | ICD-10-CM | POA: Diagnosis present

## 2015-10-12 DIAGNOSIS — S5291XA Unspecified fracture of right forearm, initial encounter for closed fracture: Secondary | ICD-10-CM

## 2015-10-12 DIAGNOSIS — S62101A Fracture of unspecified carpal bone, right wrist, initial encounter for closed fracture: Secondary | ICD-10-CM | POA: Diagnosis present

## 2015-10-12 DIAGNOSIS — Z87891 Personal history of nicotine dependence: Secondary | ICD-10-CM

## 2015-10-12 LAB — BASIC METABOLIC PANEL
ANION GAP: 7 (ref 5–15)
BUN: 35 mg/dL — ABNORMAL HIGH (ref 6–20)
CALCIUM: 9.4 mg/dL (ref 8.9–10.3)
CO2: 25 mmol/L (ref 22–32)
CREATININE: 2.5 mg/dL — AB (ref 0.61–1.24)
Chloride: 102 mmol/L (ref 101–111)
GFR, EST AFRICAN AMERICAN: 26 mL/min — AB (ref >60)
GFR, EST NON AFRICAN AMERICAN: 22 mL/min — AB (ref >60)
Glucose, Bld: 286 mg/dL — ABNORMAL HIGH (ref 65–99)
Potassium: 4.2 mmol/L (ref 3.5–5.1)
SODIUM: 134 mmol/L — AB (ref 135–145)

## 2015-10-12 LAB — CBC WITH DIFFERENTIAL/PLATELET
BASOS ABS: 0 10*3/uL (ref 0.0–0.1)
BASOS PCT: 0 %
EOS ABS: 0.3 10*3/uL (ref 0.0–0.7)
Eosinophils Relative: 1 %
HCT: 34.9 % — ABNORMAL LOW (ref 39.0–52.0)
HEMOGLOBIN: 11.8 g/dL — AB (ref 13.0–17.0)
LYMPHS PCT: 24 %
Lymphs Abs: 6.5 10*3/uL — ABNORMAL HIGH (ref 0.7–4.0)
MCH: 31.3 pg (ref 26.0–34.0)
MCHC: 33.8 g/dL (ref 30.0–36.0)
MCV: 92.6 fL (ref 78.0–100.0)
MONO ABS: 1.4 10*3/uL — AB (ref 0.1–1.0)
Monocytes Relative: 5 %
NEUTROS PCT: 70 %
Neutro Abs: 18.8 10*3/uL — ABNORMAL HIGH (ref 1.7–7.7)
PLATELETS: 155 10*3/uL (ref 150–400)
RBC: 3.77 MIL/uL — AB (ref 4.22–5.81)
RDW: 15.3 % (ref 11.5–15.5)
WBC: 27 10*3/uL — AB (ref 4.0–10.5)

## 2015-10-12 LAB — TYPE AND SCREEN
ABO/RH(D): O POS
ANTIBODY SCREEN: NEGATIVE

## 2015-10-12 LAB — GLUCOSE, CAPILLARY: GLUCOSE-CAPILLARY: 213 mg/dL — AB (ref 65–99)

## 2015-10-12 LAB — MAGNESIUM: MAGNESIUM: 1.9 mg/dL (ref 1.7–2.4)

## 2015-10-12 LAB — ABO/RH: ABO/RH(D): O POS

## 2015-10-12 MED ORDER — FENOFIBRATE 145 MG PO TABS
145.0000 mg | ORAL_TABLET | Freq: Every day | ORAL | Status: DC
  Filled 2015-10-12: qty 1

## 2015-10-12 MED ORDER — LATANOPROST 0.005 % OP SOLN
1.0000 [drp] | Freq: Every day | OPHTHALMIC | Status: DC
  Administered 2015-10-12: 1 [drp] via OPHTHALMIC
  Filled 2015-10-12: qty 2.5

## 2015-10-12 MED ORDER — SENNA 8.6 MG PO TABS
2.0000 | ORAL_TABLET | Freq: Two times a day (BID) | ORAL | Status: DC
  Administered 2015-10-13 – 2015-10-14 (×3): 17.2 mg via ORAL
  Filled 2015-10-12 (×3): qty 2

## 2015-10-12 MED ORDER — ATORVASTATIN CALCIUM 10 MG PO TABS
10.0000 mg | ORAL_TABLET | Freq: Every day | ORAL | Status: DC
Start: 2015-10-12 — End: 2015-10-14
  Administered 2015-10-12 – 2015-10-14 (×3): 10 mg via ORAL
  Filled 2015-10-12 (×3): qty 1

## 2015-10-12 MED ORDER — CLONIDINE HCL 0.1 MG PO TABS: 0.1000 mg | ORAL_TABLET | Freq: Two times a day (BID) | ORAL | Status: DC

## 2015-10-12 MED ORDER — CLONIDINE HCL 0.1 MG PO TABS
0.1000 mg | ORAL_TABLET | Freq: Every day | ORAL | Status: DC
  Administered 2015-10-12 – 2015-10-13 (×2): 0.1 mg via ORAL
  Filled 2015-10-12 (×2): qty 1

## 2015-10-12 MED ORDER — MAGNESIUM CITRATE PO SOLN: 1.0000 | Freq: Once | ORAL | Status: DC | PRN

## 2015-10-12 MED ORDER — ISOSORBIDE MONONITRATE ER 60 MG PO TB24
60.0000 mg | ORAL_TABLET | Freq: Every morning | ORAL | Status: DC
  Administered 2015-10-13 – 2015-10-14 (×2): 60 mg via ORAL
  Filled 2015-10-12 (×2): qty 1

## 2015-10-12 MED ORDER — DORZOLAMIDE HCL-TIMOLOL MAL 2-0.5 % OP SOLN
1.0000 [drp] | Freq: Two times a day (BID) | OPHTHALMIC | Status: DC
  Administered 2015-10-12 – 2015-10-14 (×4): 1 [drp] via OPHTHALMIC
  Filled 2015-10-12: qty 10

## 2015-10-12 MED ORDER — AMLODIPINE BESYLATE 10 MG PO TABS
10.0000 mg | ORAL_TABLET | Freq: Every morning | ORAL | Status: DC
  Administered 2015-10-13 – 2015-10-14 (×2): 10 mg via ORAL
  Filled 2015-10-12 (×2): qty 1

## 2015-10-12 MED ORDER — FUROSEMIDE 40 MG PO TABS
40.0000 mg | ORAL_TABLET | Freq: Two times a day (BID) | ORAL | Status: DC
  Administered 2015-10-13 – 2015-10-14 (×4): 40 mg via ORAL
  Filled 2015-10-12 (×4): qty 1

## 2015-10-12 MED ORDER — FENOFIBRATE 160 MG PO TABS: 160.0000 mg | ORAL_TABLET | Freq: Every day | ORAL | Status: DC

## 2015-10-12 MED ORDER — PANTOPRAZOLE SODIUM 40 MG PO TBEC
40.0000 mg | DELAYED_RELEASE_TABLET | Freq: Every day | ORAL | Status: DC
  Administered 2015-10-13 – 2015-10-14 (×2): 40 mg via ORAL
  Filled 2015-10-12 (×2): qty 1

## 2015-10-12 MED ORDER — LISINOPRIL 10 MG PO TABS
10.0000 mg | ORAL_TABLET | Freq: Every day | ORAL | Status: DC
  Administered 2015-10-13 – 2015-10-14 (×2): 10 mg via ORAL
  Filled 2015-10-12 (×2): qty 1

## 2015-10-12 MED ORDER — CLONIDINE HCL 0.2 MG PO TABS
0.2000 mg | ORAL_TABLET | Freq: Every day | ORAL | Status: DC
  Administered 2015-10-13 – 2015-10-14 (×2): 0.2 mg via ORAL
  Filled 2015-10-12 (×2): qty 1

## 2015-10-12 MED ORDER — ONDANSETRON HCL 4 MG/2ML IJ SOLN: 4.0000 mg | Freq: Four times a day (QID) | INTRAMUSCULAR | Status: DC | PRN

## 2015-10-12 MED ORDER — FENTANYL CITRATE (PF) 100 MCG/2ML IJ SOLN
50.0000 ug | Freq: Once | INTRAMUSCULAR | Status: AC
  Administered 2015-10-12: 50 ug via INTRAVENOUS
  Filled 2015-10-12: qty 2

## 2015-10-12 MED ORDER — LIDOCAINE 5 % EX PTCH
2.0000 | MEDICATED_PATCH | Freq: Two times a day (BID) | CUTANEOUS | Status: DC
  Administered 2015-10-12 – 2015-10-14 (×4): 2 via TRANSDERMAL
  Filled 2015-10-12 (×5): qty 2

## 2015-10-12 MED ORDER — OXYCODONE HCL ER 15 MG PO T12A
30.0000 mg | EXTENDED_RELEASE_TABLET | Freq: Two times a day (BID) | ORAL | Status: DC
  Administered 2015-10-12 – 2015-10-14 (×4): 30 mg via ORAL
  Filled 2015-10-12 (×4): qty 2

## 2015-10-12 MED ORDER — POLYETHYLENE GLYCOL 3350 17 GM/SCOOP PO POWD
17.0000 g | Freq: Two times a day (BID) | ORAL | Status: DC
  Administered 2015-10-14: 17 g via ORAL
  Filled 2015-10-12: qty 255

## 2015-10-12 MED ORDER — INSULIN GLARGINE 100 UNIT/ML ~~LOC~~ SOLN
20.0000 [IU] | Freq: Every day | SUBCUTANEOUS | Status: DC
  Filled 2015-10-12 (×2): qty 0.2

## 2015-10-12 MED ORDER — SODIUM CHLORIDE 0.9 % IV SOLN
INTRAVENOUS | Status: DC
  Administered 2015-10-12: 18:00:00 via INTRAVENOUS

## 2015-10-12 MED ORDER — OXYCODONE HCL 5 MG PO TABS
5.0000 mg | ORAL_TABLET | Freq: Three times a day (TID) | ORAL | Status: DC | PRN
  Administered 2015-10-12 – 2015-10-14 (×3): 5 mg via ORAL
  Filled 2015-10-12 (×3): qty 1

## 2015-10-12 MED ORDER — MEMANTINE HCL ER 14 MG PO CP24
14.0000 mg | ORAL_CAPSULE | Freq: Every morning | ORAL | Status: DC
  Administered 2015-10-13 – 2015-10-14 (×2): 14 mg via ORAL
  Filled 2015-10-12 (×2): qty 1

## 2015-10-12 MED ORDER — LORAZEPAM 0.5 MG PO TABS
0.5000 mg | ORAL_TABLET | Freq: Every day | ORAL | Status: DC
  Administered 2015-10-13 – 2015-10-14 (×2): 0.5 mg via ORAL
  Filled 2015-10-12 (×2): qty 1

## 2015-10-12 MED ORDER — ONDANSETRON HCL 4 MG PO TABS: 4.0000 mg | ORAL_TABLET | Freq: Three times a day (TID) | ORAL | Status: DC | PRN

## 2015-10-12 MED ORDER — BUPROPION HCL 75 MG PO TABS
75.0000 mg | ORAL_TABLET | Freq: Two times a day (BID) | ORAL | Status: DC
  Administered 2015-10-12 – 2015-10-14 (×4): 75 mg via ORAL
  Filled 2015-10-12 (×5): qty 1

## 2015-10-12 MED ORDER — MORPHINE SULFATE (PF) 4 MG/ML IV SOLN
4.0000 mg | Freq: Once | INTRAVENOUS | Status: AC
  Administered 2015-10-12: 4 mg via INTRAVENOUS
  Filled 2015-10-12: qty 1

## 2015-10-12 MED ORDER — SORBITOL 70 % SOLN: 30.0000 mL | Freq: Every day | Status: DC | PRN

## 2015-10-12 MED ORDER — DOCUSATE SODIUM 100 MG PO CAPS
100.0000 mg | ORAL_CAPSULE | Freq: Two times a day (BID) | ORAL | Status: DC
  Administered 2015-10-13 – 2015-10-14 (×3): 100 mg via ORAL
  Filled 2015-10-12 (×3): qty 1

## 2015-10-12 MED ORDER — MORPHINE SULFATE (PF) 2 MG/ML IV SOLN: 0.5000 mg | INTRAVENOUS | Status: DC | PRN

## 2015-10-12 MED ORDER — POLYETHYLENE GLYCOL 3350 17 G PO PACK: 17.0000 g | PACK | Freq: Every day | ORAL | Status: DC | PRN

## 2015-10-12 MED ORDER — HYDROCODONE-ACETAMINOPHEN 5-325 MG PO TABS
1.0000 | ORAL_TABLET | Freq: Four times a day (QID) | ORAL | Status: DC | PRN
  Administered 2015-10-13: 2 via ORAL
  Filled 2015-10-12: qty 2

## 2015-10-12 MED ORDER — ACETAMINOPHEN 325 MG PO TABS
650.0000 mg | ORAL_TABLET | Freq: Four times a day (QID) | ORAL | Status: DC | PRN
  Administered 2015-10-13: 650 mg via ORAL
  Filled 2015-10-12: qty 2

## 2015-10-12 MED ORDER — GABAPENTIN 100 MG PO CAPS
100.0000 mg | ORAL_CAPSULE | Freq: Two times a day (BID) | ORAL | Status: DC
Start: 2015-10-12 — End: 2015-10-14
  Administered 2015-10-12 – 2015-10-14 (×4): 100 mg via ORAL
  Filled 2015-10-12 (×4): qty 1

## 2015-10-12 MED ORDER — METOPROLOL TARTRATE 25 MG PO TABS
25.0000 mg | ORAL_TABLET | Freq: Two times a day (BID) | ORAL | Status: DC
  Administered 2015-10-12 – 2015-10-14 (×3): 25 mg via ORAL
  Filled 2015-10-12 (×4): qty 1

## 2015-10-12 MED ORDER — SODIUM CHLORIDE 0.9 % IV SOLN
INTRAVENOUS | Status: DC
  Administered 2015-10-12 – 2015-10-13 (×2): via INTRAVENOUS

## 2015-10-12 NOTE — ED Triage Notes (Signed)
Per [facility], pt from [facility], ([facility]), pt fell yesterday trying to get into his wheelchair, pt slipped and fell on his R side. Pt had xrays done of R wrist and R hip at facility, got results today which showed R radial fx with displacement and R femoral head fx with displacement. Pt has shortening and rotation to R leg. Pulses palpable. Pt has 10/10 pain, given fentanyl by ems. Hx of dementia, diabetes. Denies hitting head, denies LOC. R wrist is red and swollen.

## 2015-10-12 NOTE — ED Notes (Signed)
Ortho tech made aware of need for sugartong to right arm.

## 2015-10-12 NOTE — ED Notes (Signed)
Placed into a gown in a monitor

## 2015-10-12 NOTE — ED Provider Notes (Signed)
[facility]-EMERGENCY DEPT Provider Note   CSN: [id number] Arrival date & time: 10/12/15  1358  First Provider Contact:  First MD Initiated Contact with Patient 10/12/15 1415        History   Chief Complaint Chief Complaint  Patient presents with  . Fall  . Wrist Pain  . Hip Injury    HPI [name] is a 82 y.o. male.  LEVEL 5 CAVEAT--Dementia  HPI [name] is a 82 y.o. male with PMH significant for CHF, DDD, GERD, PVD, renal insufficiency, DM who presents with mechanical fall.  Patient was attempting to get in his wheelchair yesterday when he fell, landing on his right side.  No LOC or head injury.  No preceding/concomittant CP, SOB, dizziness.  He was complaining of right wrist and hip pain.  Plain films were performed and resulted today which showed displaced right femoral head fx and displaced right radial fx.  He is not anticoagulated.  Spoke with nursing staff and patient has been acting at baseline without complaints.  The fall was unwitnessed by staff, but other facility member called out for help when he saw the patient fall.  When asked to point where he hurts he points to his right hip.  He is DNR.   Past Medical History:  Diagnosis Date  . 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 Anxiety   . At high risk for falls   . Chronic diastolic congestive heart failure (HCC) 06/13/2012   Echo in 2011 with grade 1 diastolic dysfunction and normal systolic function with EF 60%.     . DDD (degenerative disc disease), lumbosacral   . Depression   . Diabetic peripheral neuropathy (HCC)   . Dyslipidemia   . GERD (gastroesophageal reflux disease)   . History of colon polyps   . History of diabetic ulcer of foot   . History of MRSA infection    Hx chronic diabetic ulcer's secondary MRSA  . Hypertension   . Insulin dependent type 2 diabetes mellitus (HCC)   . Peripheral vascular disease (HCC)   . Portal hypertension (HCC) 01/15/2010   Qualifier: Diagnosis of  By: Everardo All MD,  Cleophas Dunker   . Renal insufficiency   . Ulcer of right ankle (HCC)   . UNSPECIFIED PERIPHERAL VASCULAR DISEASE 01/15/2010   Qualifier: Diagnosis of  By: Everardo All MD, Cleophas Dunker   . Wheelchair dependent     Patient Active Problem List   Diagnosis Date Noted  . Hypertensive heart disease with CHF (congestive heart failure) (HCC) 05/29/2015  . Stage II pressure ulcer of right ankle 03/19/2015  . Dyslipidemia associated with type 2 diabetes mellitus (HCC) 01/31/2015  . Osteoarthritis 08/15/2014  . Depression with anxiety 11/27/2013  . Constipation due to pain medication 07/18/2013  . Glaucoma 12/07/2012  . Type 2 diabetes mellitus with neurological manifestations, controlled (HCC) 11/13/2012  . Vascular dementia 10/29/2012  . Chronic pain 10/29/2012  . Chronic diastolic congestive heart failure (HCC) 06/13/2012  . Anemia 01/15/2010  . Diabetic peripheral neuropathy associated with type 2 diabetes mellitus (HCC) 01/15/2010  . GERD 01/15/2010    Past Surgical History:  Procedure Laterality Date  . COLONOSCOPY WITH ESOPHAGOGASTRODUODENOSCOPY (EGD)  09-21-2002  . INCISION AND DRAINAGE OF WOUND Right 01/10/2014   Procedure: IRRIGATION AND DEBRIDEMENT RIGHT ANKLE WOUND WITH PLACEMENT OF ACELL;  Surgeon: Wayland Denis, DO;  Location: [facility];  Service: Plastics;  Laterality: Right;  . INGUINAL HERNIA REPAIR Left 11-30-2001  . LAPAROSCOPIC CHOLECYSTECTOMY    .  TOE AMPUTATION    . TRANSTHORACIC ECHOCARDIOGRAM  12-20-2009   severe LVH/  ef 60%/  grade I diastolic dysfunction/  AV sclerosis without stenosis/  mild LAE and RAE/         Home Medications    Prior to Admission medications   Medication Sig Start Date End Date Taking? Authorizing Provider  amLODipine (NORVASC) 5 MG tablet Take 10 mg by mouth every morning.     Historical Provider, MD  ascorbic acid (VITAMIN C) 500 MG tablet Take 500 mg by mouth 2 (two) times daily.    Historical Provider, MD  aspirin 81 MG tablet  Take 81 mg by mouth every morning.     Historical Provider, MD  atorvastatin (LIPITOR) 10 MG tablet Take 1 tablet (10 mg total) by mouth daily. 01/31/15   Sharee Holstereborah S Green, NP  buPROPion (WELLBUTRIN) 75 MG tablet Take 75 mg by mouth 2 (two) times daily.     Historical Provider, MD  cloNIDine (CATAPRES) 0.1 MG tablet Take 0.1-0.2 mg by mouth 2 (two) times daily. Reported on 07/27/2015    Historical Provider, MD  coal tar (NEUTROGENA T-GEL) 0.5 % shampoo Apply topically. Apply topically every 96 hours for dandruff weekly as needed    Historical Provider, MD  diclofenac sodium (VOLTAREN) 1 % GEL Apply 2 g topically 3 (three) times daily. To right wrist    Historical Provider, MD  dorzolamide-timolol (COSOPT) 22.3-6.8 MG/ML ophthalmic solution Place 1 drop into both eyes 2 (two) times daily.    Historical Provider, MD  fenofibrate (TRICOR) 145 MG tablet Take 145 mg by mouth every evening.     Historical Provider, MD  furosemide (LASIX) 40 MG tablet Take 40 mg by mouth 2 (two) times daily.     Historical Provider, MD  gabapentin (NEURONTIN) 100 MG capsule Take 100 mg by mouth 2 (two) times daily.    Historical Provider, MD  insulin aspart (NOVOLOG) 100 UNIT/ML injection Inject 5 Units into the skin 3 (three) times daily before meals. Inject as per sliding scale: if  0-150 =0 units; 151-450 = 8 units, subcutaneously before meals related to DM. Call MD for BS> 450    Historical Provider, MD  Insulin Glargine (LANTUS SOLOSTAR) 100 UNIT/ML Solostar Pen Inject 35 Units into the skin daily at 10 pm. Patient taking differently: Inject 32 Units into the skin daily at 10 pm.  07/27/15   Sharee Holstereborah S Green, NP  isosorbide mononitrate (IMDUR) 60 MG 24 hr tablet Take 60 mg by mouth every morning.     Historical Provider, MD  latanoprost (XALATAN) 0.005 % ophthalmic solution Place 1 drop into both eyes at bedtime.      Historical Provider, MD  lidocaine (LIDODERM) 5 % Place 1 patch onto the skin every 12 (twelve) hours.  Remove & Discard patch within 12 hours to right neck  And to left hip    Historical Provider, MD  lisinopril (PRINIVIL,ZESTRIL) 5 MG tablet Take 10 mg by mouth daily.     Historical Provider, MD  LORazepam (ATIVAN) 0.5 MG tablet Take 0.5 mg by mouth daily. Prior to AM care    Historical Provider, MD  Memantine HCl ER (NAMENDA XR) 14 MG CP24 Take 1 tablet by mouth every morning.    Historical Provider, MD  metoprolol (LOPRESSOR) 50 MG tablet Take 50 mg by mouth 2 (two) times daily.    Historical Provider, MD  Nutritional Supplements (NUTRITIONAL SUPPLEMENT PO) Take by mouth. HSG Mech Soft Texture, for diet  Historical Provider, MD  nystatin (MYCOSTATIN/NYSTOP) powder Apply topically 2 (two) times daily.    Historical Provider, MD  omeprazole (PRILOSEC) 20 MG capsule Take 40 mg by mouth every morning.     Historical Provider, MD  ondansetron (ZOFRAN) 4 MG tablet Take 4 mg by mouth every 8 (eight) hours as needed for nausea or vomiting.    Historical Provider, MD  oxycodone (OXY-IR) 5 MG capsule Take one tablet by mouth every 8 hours as needed for pain. 04/13/14   Kirt Boys, DO  oxyCODONE (OXYCONTIN) 30 MG 12 hr tablet Take one tablet by mouth every 12 hours for pain NO NOT CUT/CRUSH/CHEW 04/10/15   Tiffany L Reed, DO  polyethylene glycol powder (GLYCOLAX/MIRALAX) powder Take 17 g by mouth 2 (two) times daily.     Historical Provider, MD  potassium chloride (KLOR-CON) 20 MEQ packet Take 20 mEq by mouth 2 (two) times daily.    Historical Provider, MD  potassium chloride SA (KLOR-CON M20) 20 MEQ tablet Take 20 mEq by mouth 2 (two) times daily.    Historical Provider, MD  protein supplement (PROMOD) POWD Take 1 scoop by mouth 2 (two) times daily.    Historical Provider, MD  senna (SENOKOT) 8.6 MG tablet Take 2 tablets by mouth 2 (two) times daily.     Historical Provider, MD    Family History Family History  Problem Relation Age of Onset  . Diabetes Neg Hx     No DM in immediate family     Social History Social History  Substance Use Topics  . Smoking status: Former Games developer  . Smokeless tobacco: Not on file  . Alcohol use No     Comment: hx alcohol abuse  in remission     Allergies   Review of patient's allergies indicates no known allergies.   Review of Systems Review of Systems  Unable to perform ROS: Dementia     Physical Exam Updated Vital Signs BP 117/60   Pulse 65   Temp 98.3 F (36.8 C) (Oral)   Resp 20   SpO2 94%   Physical Exam  Constitutional: He is oriented to person, place, and time. He appears well-developed and well-nourished.  Non-toxic appearance. He does not have a sickly appearance. He does not appear ill.  HENT:  Head: Normocephalic and atraumatic.  Mouth/Throat: Oropharynx is clear and moist.  Eyes: Conjunctivae are normal. Pupils are equal, round, and reactive to light.  Neck: Normal range of motion. Neck supple.  No cervical midline tenderness.   Cardiovascular: Normal rate and regular rhythm.   Pulses:      Radial pulses are 2+ on the right side, and 2+ on the left side.       Dorsalis pedis pulses are 2+ on the right side, and 2+ on the left side.  Pulmonary/Chest: Effort normal and breath sounds normal. No accessory muscle usage or stridor. No respiratory distress. He has no wheezes. He has no rhonchi. He has no rales.  Abdominal: Soft. Bowel sounds are normal. He exhibits no distension. There is no tenderness. There is no rebound and no guarding.  Musculoskeletal: Normal range of motion. He exhibits edema and tenderness.  No t/l/s midline tenderness.  Right wrist TTP over distal radius.  There is swelling and bruising.  Shortening and externally rotated. Decreased ROM due to pain. Right hip: TTP, no bruising or swelling.  Pain with PROM in flexion/extension/internal and external rotation.  Minimal AROM due to pain.   Lymphadenopathy:    He  has no cervical adenopathy.  Neurological: He is alert and oriented to person,  place, and time.  Speech clear without dysarthria.  Skin: Skin is warm and dry.  Psychiatric: He has a normal mood and affect. His behavior is normal.     ED Treatments / Results  Labs (all labs ordered are listed, but only abnormal results are displayed) Labs Reviewed  BASIC METABOLIC PANEL  CBC WITH DIFFERENTIAL/PLATELET    EKG  EKG Interpretation None       Radiology Dg Chest 1 View  Result Date: 10/12/2015 CLINICAL DATA:  Patient with dementia fell earlier today. SOB and congested sounding while laying down for other imaging. Bilateral lower leg/ankle swelling. Hx HTN, CHF, and Diabetes. EXAM: CHEST 1 VIEW COMPARISON:  02/27/2010 FINDINGS: Cardiac silhouette is normal in size. The aorta is mildly uncoiled with calcification throughout the thoracic aorta. No mediastinal or hilar masses or convincing adenopathy. Lungs show bilateral irregular interstitial thickening. There is central mild vascular congestion. No focal consolidation is seen to suggest pneumonia. No convincing pleural effusion. No pneumothorax. Bony thorax is demineralized but grossly intact. IMPRESSION: 1. Mild central vascular congestion with bilateral interstitial thickening. Findings are similar to prior exams. No convincing pulmonary edema/congestive heart failure. No evidence of pneumonia. Electronically Signed   By: Amie Portland M.D.   On: 10/12/2015 15:42   Dg Wrist Complete Right  Result Date: 10/12/2015 CLINICAL DATA:  Right wrist deformity and bruising after fall today. EXAM: RIGHT WRIST - COMPLETE 3+ VIEW COMPARISON:  None. FINDINGS: Posterior displacement of comminuted distal radial fracture is noted. This appears to be closed and posttraumatic. Severe narrowing of radiocarpal joint is noted suggesting degenerative change. No soft tissue abnormality is noted. IMPRESSION: Moderately displaced and comminuted distal right radial fracture is noted. Electronically Signed   By: Lupita Raider, M.D.   On:  10/12/2015 15:41   Ct Head Wo Contrast  Result Date: 10/12/2015 CLINICAL DATA:  Status post fall yesterday getting into a wheelchair. Initial encounter. EXAM: CT HEAD WITHOUT CONTRAST CT CERVICAL SPINE WITHOUT CONTRAST TECHNIQUE: Multidetector CT imaging of the head and cervical spine was performed following the standard protocol without intravenous contrast. Multiplanar CT image reconstructions of the cervical spine were also generated. COMPARISON:  Head CT scan 03/02/2010. FINDINGS: CT HEAD FINDINGS There is cortical atrophy and chronic microvascular ischemic change. No evidence of acute intracranial abnormality occlusion hemorrhage, infarct, mass lesion, mass effect, midline shift or abnormal extra-axial fluid collection is identified. The calvarium is intact. Imaged paranasal sinuses and mastoid air cells are clear. CT CERVICAL SPINE FINDINGS No fracture is identified. There is severe multilevel spondylosis. The patient is status post posterior decompression C4-5 the C6-7. Lung apices demonstrate partial visualization of small appearing bilateral pleural effusions. Extensive atherosclerosis is noted. IMPRESSION: No acute abnormality head or cervical spine. Advanced multilevel spondylosis with postoperative change of decompression C4-C6 identified. Bilateral pleural effusions appear small but are incompletely seen. Extensive atherosclerosis. Electronically Signed   By: Drusilla Kanner M.D.   On: 10/12/2015 15:00   Ct Cervical Spine Wo Contrast  Result Date: 10/12/2015 CLINICAL DATA:  Status post fall yesterday getting into a wheelchair. Initial encounter. EXAM: CT HEAD WITHOUT CONTRAST CT CERVICAL SPINE WITHOUT CONTRAST TECHNIQUE: Multidetector CT imaging of the head and cervical spine was performed following the standard protocol without intravenous contrast. Multiplanar CT image reconstructions of the cervical spine were also generated. COMPARISON:  Head CT scan 03/02/2010. FINDINGS: CT HEAD FINDINGS  There is cortical atrophy and chronic  microvascular ischemic change. No evidence of acute intracranial abnormality occlusion hemorrhage, infarct, mass lesion, mass effect, midline shift or abnormal extra-axial fluid collection is identified. The calvarium is intact. Imaged paranasal sinuses and mastoid air cells are clear. CT CERVICAL SPINE FINDINGS No fracture is identified. There is severe multilevel spondylosis. The patient is status post posterior decompression C4-5 the C6-7. Lung apices demonstrate partial visualization of small appearing bilateral pleural effusions. Extensive atherosclerosis is noted. IMPRESSION: No acute abnormality head or cervical spine. Advanced multilevel spondylosis with postoperative change of decompression C4-C6 identified. Bilateral pleural effusions appear small but are incompletely seen. Extensive atherosclerosis. Electronically Signed   By: Drusilla Kanner M.D.   On: 10/12/2015 15:00   Dg Hip Unilat W Or Wo Pelvis 2-3 Views Right  Result Date: 10/12/2015 CLINICAL DATA:  Dementia, fall today EXAM: DG HIP (WITH OR WITHOUT PELVIS) 2-3V RIGHT COMPARISON:  None. FINDINGS: Four views of the right hip submitted. There is diffuse osteopenia. There is displaced fracture of the right femoral neck. Mild degenerative changes pubic symphysis. IMPRESSION: Displaced fracture of the right femoral neck. Electronically Signed   By: Natasha Mead M.D.   On: 10/12/2015 15:40    Procedures Procedures (including critical care time)  Medications Ordered in ED Medications  fentaNYL (SUBLIMAZE) injection 50 mcg (50 mcg Intravenous Given 10/12/15 1549)     Initial Impression / Assessment and Plan / ED Course  I have reviewed the triage vital signs and the nursing notes.  Pertinent labs & imaging results that were available during my care of the patient were reviewed by me and considered in my medical decision making (see chart for details).  Clinical Course   Patient presents with fall  from standing while attempting to get in his wheelchair yesterday. At baseline, has been at baseline at facility.  Has been otherwise normal state of health.  This was a mechanical fall.  He has shortening and external rotation of the right hip as well as bruising, swelling, and tenderness over distal right radius. Neurovascularly intact. No skin tenting.  CT head and cervical spine negative for acute abnormality.  Plain films show moderately displaced and comminuted distal right radial fracture and displaced fx of right femoral neck.  CXR unchanged from previous.  Labs pending.  Fentanyl for pain.  Ortho and hand surgery consulted, Dr. Linna Caprice and Dr. Merlyn Lot.  Placed in sugar tong splint.  Plan to admit to medicine.  Patient care hand off to oncoming provider, Dr. Donnald Garre, who will follow up on labs and admission.  Case has been discussed with and seen by Dr. Clydene Pugh who agrees with the above plan for admission.  Final Clinical Impressions(s) / ED Diagnoses   Final diagnoses:  Fall  Closed fracture of neck of right femur, initial encounter (HCC)  Right radial fracture, closed, initial encounter  Fall, initial encounter    New Prescriptions New Prescriptions   No medications on file     Cheri Fowler, PA-C 10/12/15 1613    Lyndal Pulley, MD 10/12/15 [phone]

## 2015-10-12 NOTE — ED Notes (Signed)
Patient's daughter Tia MaskerRobin McMurray [phone].

## 2015-10-12 NOTE — Progress Notes (Signed)
Patient arrived to unit in stable condition, oriented to room, call light within reach. 

## 2015-10-12 NOTE — Consult Note (Signed)
[name] is an 82 y.o. male.    Chief Complaint: right wrist and right hip pain  HPI: 82 y/o male multiple medical comorbidities presents to the emergency department after an unwitnessed fall yesterday at skilled nursing facility ([facility]). Pt c/o right wrist pain and right hip pain. Unable to ambulate after his fall. History is difficult due to his mentation. Sugar tong splint in place currently for right upper extremity. Pt denies any other area of injury or pain.  PCP:  Gildardo Cranker, DO  PMH: Past Medical History:  Diagnosis Date  . 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 Anxiety   . At high risk for falls   . Chronic diastolic congestive heart failure ([place]) 06/13/2012   Echo in 2011 with grade 1 diastolic dysfunction and normal systolic function with EF 60%.     . DDD (degenerative disc disease), lumbosacral   . Depression   . Diabetic peripheral neuropathy ([place])   . Dyslipidemia   . GERD (gastroesophageal reflux disease)   . History of colon polyps   . History of diabetic ulcer of foot   . History of MRSA infection    Hx chronic diabetic ulcer's secondary MRSA  . Hypertension   . Insulin dependent type 2 diabetes mellitus ([place])   . Peripheral vascular disease ([place])   . Portal hypertension ([place]) 01/15/2010   Qualifier: Diagnosis of  By: Loanne Drilling MD, Jacelyn Pi   . Renal insufficiency   . Ulcer of right ankle ([place])   . UNSPECIFIED PERIPHERAL VASCULAR DISEASE 01/15/2010   Qualifier: Diagnosis of  By: Loanne Drilling MD, Jacelyn Pi   . Wheelchair dependent     PSH: Past Surgical History:  Procedure Laterality Date  . COLONOSCOPY WITH ESOPHAGOGASTRODUODENOSCOPY (EGD)  09-21-2002  . INCISION AND DRAINAGE OF WOUND Right 01/10/2014   Procedure: IRRIGATION AND DEBRIDEMENT RIGHT ANKLE WOUND WITH PLACEMENT OF ACELL;  Surgeon: Theodoro Kos, DO;  Location: [facility];  Service: Plastics;  Laterality: Right;  . INGUINAL HERNIA REPAIR Left 11-30-2001  . LAPAROSCOPIC  CHOLECYSTECTOMY    . TOE AMPUTATION    . TRANSTHORACIC ECHOCARDIOGRAM  12-20-2009   severe LVH/  ef 16%/  grade I diastolic dysfunction/  AV sclerosis without stenosis/  mild LAE and RAE/      Social History:  reports that he has quit smoking. He does not have any smokeless tobacco history on file. He reports that he does not drink alcohol. His drug history is not on file.  Allergies:  No Known Allergies  Medications: Current Facility-Administered Medications  Medication Dose Route Frequency Provider Last Rate Last Dose  . 0.9 %  sodium chloride infusion   Intravenous Continuous Charlesetta Shanks, MD      . morphine 4 MG/ML injection 4 mg  4 mg Intravenous Once Charlesetta Shanks, MD       Current Outpatient Prescriptions  Medication Sig Dispense Refill  . amLODipine (NORVASC) 5 MG tablet Take 10 mg by mouth every morning.     Marland Kitchen ascorbic acid (VITAMIN C) 500 MG tablet Take 500 mg by mouth 2 (two) times daily.    Marland Kitchen aspirin 81 MG tablet Take 81 mg by mouth every morning.     Marland Kitchen atorvastatin (LIPITOR) 10 MG tablet Take 1 tablet (10 mg total) by mouth daily. 90 tablet 3  . buPROPion (WELLBUTRIN) 75 MG tablet Take 75 mg by mouth 2 (two) times daily.     . cloNIDine (CATAPRES) 0.1 MG tablet Take 0.1-0.2 mg by  mouth 2 (two) times daily. Reported on 07/27/2015    . coal tar (NEUTROGENA T-GEL) 0.5 % shampoo Apply topically. Apply topically every 96 hours for dandruff weekly as needed    . diclofenac sodium (VOLTAREN) 1 % GEL Apply 2 g topically 3 (three) times daily. To right wrist    . dorzolamide-timolol (COSOPT) 22.3-6.8 MG/ML ophthalmic solution Place 1 drop into both eyes 2 (two) times daily.    . fenofibrate (TRICOR) 145 MG tablet Take 145 mg by mouth every evening.     . furosemide (LASIX) 40 MG tablet Take 40 mg by mouth 2 (two) times daily.     Marland Kitchen gabapentin (NEURONTIN) 100 MG capsule Take 100 mg by mouth 2 (two) times daily.    . insulin aspart (NOVOLOG) 100 UNIT/ML injection Inject 5 Units  into the skin 3 (three) times daily before meals. Inject as per sliding scale: if  0-150 =0 units; 151-450 = 8 units, subcutaneously before meals related to DM. Call MD for BS> 450    . Insulin Glargine (LANTUS SOLOSTAR) 100 UNIT/ML Solostar Pen Inject 35 Units into the skin daily at 10 pm. (Patient taking differently: Inject 32 Units into the skin daily at 10 pm. ) 5 pen PRN  . isosorbide mononitrate (IMDUR) 60 MG 24 hr tablet Take 60 mg by mouth every morning.     . latanoprost (XALATAN) 0.005 % ophthalmic solution Place 1 drop into both eyes at bedtime.      . lidocaine (LIDODERM) 5 % Place 1 patch onto the skin every 12 (twelve) hours. Remove & Discard patch within 12 hours to right neck  And to left hip    . lisinopril (PRINIVIL,ZESTRIL) 5 MG tablet Take 10 mg by mouth daily.     Marland Kitchen LORazepam (ATIVAN) 0.5 MG tablet Take 0.5 mg by mouth daily. Prior to AM care    . Memantine HCl ER (NAMENDA XR) 14 MG CP24 Take 1 tablet by mouth every morning.    . metoprolol (LOPRESSOR) 50 MG tablet Take 50 mg by mouth 2 (two) times daily.    . Nutritional Supplements (NUTRITIONAL SUPPLEMENT PO) Take by mouth. HSG Mech Soft Texture, for diet    . nystatin (MYCOSTATIN/NYSTOP) powder Apply topically 2 (two) times daily.    Marland Kitchen omeprazole (PRILOSEC) 20 MG capsule Take 40 mg by mouth every morning.     . ondansetron (ZOFRAN) 4 MG tablet Take 4 mg by mouth every 8 (eight) hours as needed for nausea or vomiting.    Marland Kitchen oxycodone (OXY-IR) 5 MG capsule Take one tablet by mouth every 8 hours as needed for pain. 90 capsule 0  . oxyCODONE (OXYCONTIN) 30 MG 12 hr tablet Take one tablet by mouth every 12 hours for pain NO NOT CUT/CRUSH/CHEW 60 each 0  . polyethylene glycol powder (GLYCOLAX/MIRALAX) powder Take 17 g by mouth 2 (two) times daily.     . potassium chloride (KLOR-CON) 20 MEQ packet Take 20 mEq by mouth 2 (two) times daily.    . potassium chloride SA (KLOR-CON M20) 20 MEQ tablet Take 20 mEq by mouth 2 (two) times  daily.    . protein supplement (PROMOD) POWD Take 1 scoop by mouth 2 (two) times daily.    Marland Kitchen senna (SENOKOT) 8.6 MG tablet Take 2 tablets by mouth 2 (two) times daily.       Results for orders placed or performed during the hospital encounter of 10/12/15 (from the past 48 hour(s))  Basic metabolic panel  Status: Abnormal   Collection Time: 10/12/15  3:56 PM  Result Value Ref Range   Sodium 134 (L) 135 - 145 mmol/L   Potassium 4.2 3.5 - 5.1 mmol/L   Chloride 102 101 - 111 mmol/L   CO2 25 22 - 32 mmol/L   Glucose, Bld 286 (H) 65 - 99 mg/dL   BUN 35 (H) 6 - 20 mg/dL   Creatinine, Ser 2.50 (H) 0.61 - 1.24 mg/dL   Calcium 9.4 8.9 - 10.3 mg/dL   GFR calc non Af Amer 22 (L) >60 mL/min   GFR calc Af Amer 26 (L) >60 mL/min    Comment: (NOTE) The eGFR has been calculated using the CKD EPI equation. This calculation has not been validated in all clinical situations. eGFR's persistently <60 mL/min signify possible Chronic Kidney Disease.    Anion gap 7 5 - 15  CBC with Differential     Status: Abnormal   Collection Time: 10/12/15  3:56 PM  Result Value Ref Range   WBC 27.0 (H) 4.0 - 10.5 K/uL   RBC 3.77 (L) 4.22 - 5.81 MIL/uL   Hemoglobin 11.8 (L) 13.0 - 17.0 g/dL   HCT 34.9 (L) 39.0 - 52.0 %   MCV 92.6 78.0 - 100.0 fL   MCH 31.3 26.0 - 34.0 pg   MCHC 33.8 30.0 - 36.0 g/dL   RDW 15.3 11.5 - 15.5 %   Platelets 155 150 - 400 K/uL   Neutrophils Relative % 70 %   Lymphocytes Relative 24 %   Monocytes Relative 5 %   Eosinophils Relative 1 %   Basophils Relative 0 %   Neutro Abs 18.8 (H) 1.7 - 7.7 K/uL   Lymphs Abs 6.5 (H) 0.7 - 4.0 K/uL   Monocytes Absolute 1.4 (H) 0.1 - 1.0 K/uL   Eosinophils Absolute 0.3 0.0 - 0.7 K/uL   Basophils Absolute 0.0 0.0 - 0.1 K/uL   Smear Review MORPHOLOGY UNREMARKABLE    Dg Chest 1 View  Result Date: 10/12/2015 CLINICAL DATA:  Patient with dementia fell earlier today. SOB and congested sounding while laying down for other imaging. Bilateral lower  leg/ankle swelling. Hx HTN, CHF, and Diabetes. EXAM: CHEST 1 VIEW COMPARISON:  02/27/2010 FINDINGS: Cardiac silhouette is normal in size. The aorta is mildly uncoiled with calcification throughout the thoracic aorta. No mediastinal or hilar masses or convincing adenopathy. Lungs show bilateral irregular interstitial thickening. There is central mild vascular congestion. No focal consolidation is seen to suggest pneumonia. No convincing pleural effusion. No pneumothorax. Bony thorax is demineralized but grossly intact. IMPRESSION: 1. Mild central vascular congestion with bilateral interstitial thickening. Findings are similar to prior exams. No convincing pulmonary edema/congestive heart failure. No evidence of pneumonia. Electronically Signed   By: Lajean Manes M.D.   On: 10/12/2015 15:42   Dg Wrist Complete Right  Result Date: 10/12/2015 CLINICAL DATA:  Right wrist deformity and bruising after fall today. EXAM: RIGHT WRIST - COMPLETE 3+ VIEW COMPARISON:  None. FINDINGS: Posterior displacement of comminuted distal radial fracture is noted. This appears to be closed and posttraumatic. Severe narrowing of radiocarpal joint is noted suggesting degenerative change. No soft tissue abnormality is noted. IMPRESSION: Moderately displaced and comminuted distal right radial fracture is noted. Electronically Signed   By: Marijo Conception, M.D.   On: 10/12/2015 15:41   Ct Head Wo Contrast  Result Date: 10/12/2015 CLINICAL DATA:  Status post fall yesterday getting into a wheelchair. Initial encounter. EXAM: CT HEAD WITHOUT CONTRAST CT CERVICAL SPINE  WITHOUT CONTRAST TECHNIQUE: Multidetector CT imaging of the head and cervical spine was performed following the standard protocol without intravenous contrast. Multiplanar CT image reconstructions of the cervical spine were also generated. COMPARISON:  Head CT scan 03/02/2010. FINDINGS: CT HEAD FINDINGS There is cortical atrophy and chronic microvascular ischemic change. No  evidence of acute intracranial abnormality occlusion hemorrhage, infarct, mass lesion, mass effect, midline shift or abnormal extra-axial fluid collection is identified. The calvarium is intact. Imaged paranasal sinuses and mastoid air cells are clear. CT CERVICAL SPINE FINDINGS No fracture is identified. There is severe multilevel spondylosis. The patient is status post posterior decompression C4-5 the C6-7. Lung apices demonstrate partial visualization of small appearing bilateral pleural effusions. Extensive atherosclerosis is noted. IMPRESSION: No acute abnormality head or cervical spine. Advanced multilevel spondylosis with postoperative change of decompression C4-C6 identified. Bilateral pleural effusions appear small but are incompletely seen. Extensive atherosclerosis. Electronically Signed   By: Inge Rise M.D.   On: 10/12/2015 15:00   Ct Cervical Spine Wo Contrast  Result Date: 10/12/2015 CLINICAL DATA:  Status post fall yesterday getting into a wheelchair. Initial encounter. EXAM: CT HEAD WITHOUT CONTRAST CT CERVICAL SPINE WITHOUT CONTRAST TECHNIQUE: Multidetector CT imaging of the head and cervical spine was performed following the standard protocol without intravenous contrast. Multiplanar CT image reconstructions of the cervical spine were also generated. COMPARISON:  Head CT scan 03/02/2010. FINDINGS: CT HEAD FINDINGS There is cortical atrophy and chronic microvascular ischemic change. No evidence of acute intracranial abnormality occlusion hemorrhage, infarct, mass lesion, mass effect, midline shift or abnormal extra-axial fluid collection is identified. The calvarium is intact. Imaged paranasal sinuses and mastoid air cells are clear. CT CERVICAL SPINE FINDINGS No fracture is identified. There is severe multilevel spondylosis. The patient is status post posterior decompression C4-5 the C6-7. Lung apices demonstrate partial visualization of small appearing bilateral pleural effusions.  Extensive atherosclerosis is noted. IMPRESSION: No acute abnormality head or cervical spine. Advanced multilevel spondylosis with postoperative change of decompression C4-C6 identified. Bilateral pleural effusions appear small but are incompletely seen. Extensive atherosclerosis. Electronically Signed   By: Inge Rise M.D.   On: 10/12/2015 15:00   Dg Hip Unilat W Or Wo Pelvis 2-3 Views Right  Result Date: 10/12/2015 CLINICAL DATA:  Dementia, fall today EXAM: DG HIP (WITH OR WITHOUT PELVIS) 2-3V RIGHT COMPARISON:  None. FINDINGS: Four views of the right hip submitted. There is diffuse osteopenia. There is displaced fracture of the right femoral neck. Mild degenerative changes pubic symphysis. IMPRESSION: Displaced fracture of the right femoral neck. Electronically Signed   By: Lahoma Crocker M.D.   On: 10/12/2015 15:40    ROS: ROS Currently lives at skilled nursing facility Unable to ambulate currently  Physical Exam: Alert and awake 82 y/o male disoriented to time NAD Cervical spine with good rom without tenderness Left upper extremity full rom without tenderness or deformity. Right upper extremity currently in sugar tong splint nv intact distally bilaterally Right lower extremity shortened and externally rotated  No significant pain with logroll of right hip nv intact distally Left lower extremity no deformity  No rashes Physical Exam   Assessment/Plan Assessment: 1. Right femur fracture 2. Right distal radius fracture  Plan: Pt to be admitted by medical team. I had a lengthy discussion with the patient's wife and stepdaughter. They state that at baseline, the patient is a nonambulator. He rarely leaves his room in the skilled nursing facility. He usually lies in his bed, or he uses a wheelchair to  go to his bathroom. He has had several MRSA infections in the past. We discussed that the purpose of surgery in the setting of a hip fracture is to restore ambulatory status. Since the  patient is a nonambulator, I do not feel that the risk of surgery would be worthwhile, as he would not receive the benefit. They understand that this injury, with without surgical intervention, carries approximately a 30% 1 year mortality rate. We will plan for nonoperative treatment. I will see him in the office 4 weeks after discharge. Strict bedrest for now. He Hilaire begin bed to chair transfers when comfortable. Pain management Right wrist fracture per hand surgery

## 2015-10-12 NOTE — H&P (Signed)
History and Physical    [name] WUJ:[id number] DOB: 02-Oct-1929 DOA: 10/12/2015  PCP: Kirt Boys, DO  Patient coming from: [facility]  Chief Complaint: Right hip pain and right wrist pain s/p fall  HPI: [name] is a 82 y.o. male with medical history significant of CAD, vascular dementia, insulin Type II DM, HTN, chronic diastolic CHF with preserved EF whom was brought in from [facility] after a fall yesterday.  He states he was attempting to get back into his wheelchair.  He is a reasonably good historian.  He voices constant, sharp, intense pain in his right wrist and right hip.  The pain from his wrist radiated up to his shoulder and the pain from his hip radiated up his right side.  Per documentation from facility patient got radiographs done yesterday but reports came in today and thus he was brought to [facility] for admission.  He was seen by Orthopaedics and will be seen by hand surgery.  He was given pain medication prior to my evaluation and is quite tired but feeling better.  States last night was rough and he did not get much sleep.   ED Course: Patient was seen and evaluated by ED physician.  Orthopaedic surgery and hand surgery were consulted.  IV access was obtained and patient was given IVF as well as morphine for pain.   Review of Systems: As per HPI otherwise 10 point review of systems negative.  Positive for pain in right hip and right wrist- decreased ROM in both (unable to assess to pain) No chest pain, increased work of breathing, abdominal pain Slightly decreased sensation of lower extremities bilaterally  Past Medical History:  Diagnosis Date  . 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 Anxiety   . At high risk for falls   . Chronic diastolic congestive heart failure (HCC) 06/13/2012   Echo in 2011 with grade 1 diastolic dysfunction and normal systolic function with EF 60%.     . DDD (degenerative disc disease), lumbosacral   .  Depression   . Diabetic peripheral neuropathy (HCC)   . Dyslipidemia   . GERD (gastroesophageal reflux disease)   . History of colon polyps   . History of diabetic ulcer of foot   . History of MRSA infection    Hx chronic diabetic ulcer's secondary MRSA  . Hypertension   . Insulin dependent type 2 diabetes mellitus (HCC)   . Peripheral vascular disease (HCC)   . Portal hypertension (HCC) 01/15/2010   Qualifier: Diagnosis of  By: Everardo All MD, Cleophas Dunker   . Renal insufficiency   . Ulcer of right ankle (HCC)   . UNSPECIFIED PERIPHERAL VASCULAR DISEASE 01/15/2010   Qualifier: Diagnosis of  By: Everardo All MD, Cleophas Dunker   . Wheelchair dependent     Past Surgical History:  Procedure Laterality Date  . COLONOSCOPY WITH ESOPHAGOGASTRODUODENOSCOPY (EGD)  09-21-2002  . INCISION AND DRAINAGE OF WOUND Right 01/10/2014   Procedure: IRRIGATION AND DEBRIDEMENT RIGHT ANKLE WOUND WITH PLACEMENT OF ACELL;  Surgeon: Wayland Denis, DO;  Location: [facility];  Service: Plastics;  Laterality: Right;  . INGUINAL HERNIA REPAIR Left 11-30-2001  . LAPAROSCOPIC CHOLECYSTECTOMY    . TOE AMPUTATION    . TRANSTHORACIC ECHOCARDIOGRAM  12-20-2009   severe LVH/  ef 60%/  grade I diastolic dysfunction/  AV sclerosis without stenosis/  mild LAE and RAE/       reports that he has quit smoking. His smoking use  included Cigarettes. He has never used smokeless tobacco. He reports that he does not drink alcohol or use drugs.  No Known Allergies  Family History  Problem Relation Age of Onset  . Diabetes Neg Hx     No DM in immediate family   Family history negative for CVA, MI, cancer in his sister  Prior to Admission medications   Medication Sig Start Date End Date Taking? Authorizing Provider  amLODipine (NORVASC) 5 MG tablet Take 10 mg by mouth every morning.     Historical Provider, MD  ascorbic acid (VITAMIN C) 500 MG tablet Take 500 mg by mouth 2 (two) times daily.    Historical Provider, MD  aspirin  81 MG tablet Take 81 mg by mouth every morning.     Historical Provider, MD  atorvastatin (LIPITOR) 10 MG tablet Take 1 tablet (10 mg total) by mouth daily. 01/31/15   Sharee Holstereborah S Green, NP  buPROPion (WELLBUTRIN) 75 MG tablet Take 75 mg by mouth 2 (two) times daily.     Historical Provider, MD  cloNIDine (CATAPRES) 0.1 MG tablet Take 0.1-0.2 mg by mouth 2 (two) times daily. Reported on 07/27/2015    Historical Provider, MD  coal tar (NEUTROGENA T-GEL) 0.5 % shampoo Apply topically. Apply topically every 96 hours for dandruff weekly as needed    Historical Provider, MD  diclofenac sodium (VOLTAREN) 1 % GEL Apply 2 g topically 3 (three) times daily. To right wrist    Historical Provider, MD  dorzolamide-timolol (COSOPT) 22.3-6.8 MG/ML ophthalmic solution Place 1 drop into both eyes 2 (two) times daily.    Historical Provider, MD  fenofibrate (TRICOR) 145 MG tablet Take 145 mg by mouth every evening.     Historical Provider, MD  furosemide (LASIX) 40 MG tablet Take 40 mg by mouth 2 (two) times daily.     Historical Provider, MD  gabapentin (NEURONTIN) 100 MG capsule Take 100 mg by mouth 2 (two) times daily.    Historical Provider, MD  insulin aspart (NOVOLOG) 100 UNIT/ML injection Inject 5 Units into the skin 3 (three) times daily before meals. Inject as per sliding scale: if  0-150 =0 units; 151-450 = 8 units, subcutaneously before meals related to DM. Call MD for BS> 450    Historical Provider, MD  Insulin Glargine (LANTUS SOLOSTAR) 100 UNIT/ML Solostar Pen Inject 35 Units into the skin daily at 10 pm. Patient taking differently: Inject 32 Units into the skin daily at 10 pm.  07/27/15   Sharee Holstereborah S Green, NP  isosorbide mononitrate (IMDUR) 60 MG 24 hr tablet Take 60 mg by mouth every morning.     Historical Provider, MD  latanoprost (XALATAN) 0.005 % ophthalmic solution Place 1 drop into both eyes at bedtime.      Historical Provider, MD  lidocaine (LIDODERM) 5 % Place 1 patch onto the skin every 12  (twelve) hours. Remove & Discard patch within 12 hours to right neck  And to left hip    Historical Provider, MD  lisinopril (PRINIVIL,ZESTRIL) 5 MG tablet Take 10 mg by mouth daily.     Historical Provider, MD  LORazepam (ATIVAN) 0.5 MG tablet Take 0.5 mg by mouth daily. Prior to AM care    Historical Provider, MD  Memantine HCl ER (NAMENDA XR) 14 MG CP24 Take 1 tablet by mouth every morning.    Historical Provider, MD  metoprolol (LOPRESSOR) 50 MG tablet Take 50 mg by mouth 2 (two) times daily.    Historical Provider, MD  Nutritional  Supplements (NUTRITIONAL SUPPLEMENT PO) Take by mouth. HSG Mech Soft Texture, for diet    Historical Provider, MD  nystatin (MYCOSTATIN/NYSTOP) powder Apply topically 2 (two) times daily.    Historical Provider, MD  omeprazole (PRILOSEC) 20 MG capsule Take 40 mg by mouth every morning.     Historical Provider, MD  ondansetron (ZOFRAN) 4 MG tablet Take 4 mg by mouth every 8 (eight) hours as needed for nausea or vomiting.    Historical Provider, MD  oxycodone (OXY-IR) 5 MG capsule Take one tablet by mouth every 8 hours as needed for pain. 04/13/14   Kirt Boys, DO  oxyCODONE (OXYCONTIN) 30 MG 12 hr tablet Take one tablet by mouth every 12 hours for pain NO NOT CUT/CRUSH/CHEW 04/10/15   Tiffany L Reed, DO  polyethylene glycol powder (GLYCOLAX/MIRALAX) powder Take 17 g by mouth 2 (two) times daily.     Historical Provider, MD  potassium chloride (KLOR-CON) 20 MEQ packet Take 20 mEq by mouth 2 (two) times daily.    Historical Provider, MD  potassium chloride SA (KLOR-CON M20) 20 MEQ tablet Take 20 mEq by mouth 2 (two) times daily.    Historical Provider, MD  protein supplement (PROMOD) POWD Take 1 scoop by mouth 2 (two) times daily.    Historical Provider, MD  senna (SENOKOT) 8.6 MG tablet Take 2 tablets by mouth 2 (two) times daily.     Historical Provider, MD    Physical Exam: Vitals:   10/12/15 1615 10/12/15 1630 10/12/15 1715 10/12/15 1815  BP: 114/57 129/59 124/59  122/74  Pulse: 66 65 64 65  Resp:      Temp:      TempSrc:      SpO2: 95% 95% 96%       Constitutional: NAD, calm, comfortable Vitals:   10/12/15 1615 10/12/15 1630 10/12/15 1715 10/12/15 1815  BP: 114/57 129/59 124/59 122/74  Pulse: 66 65 64 65  Resp:      Temp:      TempSrc:      SpO2: 95% 95% 96%    Eyes: PERRL, lids and conjunctivae normal ENMT: Mucous membranes are somewhat dry. Posterior pharynx clear of any exudate or lesions.No dentition Neck: normal, supple, no masses, no thyromegaly Respiratory: clear to auscultation bilaterally, no wheezing, no crackles. Poor respiratory effort. No accessory muscle use.  Cardiovascular: Regular rate and rhythm, no murmurs / rubs / gallops. No extremity edema. 2+ pedal pulses. No carotid bruits.  Abdomen: no tenderness, no masses palpated. No hepatosplenomegaly. Bowel sounds positive.  Musculoskeletal: no clubbing / cyanosis. Decreased ROM in right forearm and wrist as well as right hip, right leg internally rotated and shortened, Normal muscle tone.  Skin: thickened skin on anterior shins bilaterally worse on right No induration Neurologic: CN 2-12 grossly intact. Sensation decreased in feet bilaterally.   Psychiatric: Normal judgment and insight. Alert and oriented x 3 (person, place, situation). Normal mood.     Labs on Admission: I have personally reviewed following labs and imaging studies  CBC:  Recent Labs Lab 10/12/15 1556  WBC 27.0*  NEUTROABS 18.8*  HGB 11.8*  HCT 34.9*  MCV 92.6  PLT 155   Basic Metabolic Panel:  Recent Labs Lab 10/12/15 1556  NA 134*  K 4.2  CL 102  CO2 25  GLUCOSE 286*  BUN 35*  CREATININE 2.50*  CALCIUM 9.4   GFR: CrCl cannot be calculated (Unknown ideal weight.). Liver Function Tests: No results for input(s): AST, ALT, ALKPHOS, BILITOT, PROT, ALBUMIN in  the last 168 hours. No results for input(s): LIPASE, AMYLASE in the last 168 hours. No results for input(s): AMMONIA in the  last 168 hours. Coagulation Profile: No results for input(s): INR, PROTIME in the last 168 hours. Cardiac Enzymes: No results for input(s): CKTOTAL, CKMB, CKMBINDEX, TROPONINI in the last 168 hours. BNP (last 3 results) No results for input(s): PROBNP in the last 8760 hours. HbA1C: No results for input(s): HGBA1C in the last 72 hours. CBG: No results for input(s): GLUCAP in the last 168 hours. Lipid Profile: No results for input(s): CHOL, HDL, LDLCALC, TRIG, CHOLHDL, LDLDIRECT in the last 72 hours. Thyroid Function Tests: No results for input(s): TSH, T4TOTAL, FREET4, T3FREE, THYROIDAB in the last 72 hours. Anemia Panel: No results for input(s): VITAMINB12, FOLATE, FERRITIN, TIBC, IRON, RETICCTPCT in the last 72 hours. Urine analysis:    Component Value Date/Time   COLORURINE AMBER BIOCHEMICALS [place] BE AFFECTED BY COLOR (A) 03/14/2009 2303   APPEARANCEUR TURBID (A) 03/14/2009 2303   LABSPEC 1.027 03/14/2009 2303   PHURINE 5.5 03/14/2009 2303   GLUCOSEU 250 (A) 03/14/2009 2303   HGBUR TRACE (A) 03/14/2009 2303   BILIRUBINUR SMALL (A) 03/14/2009 2303   KETONESUR 15 (A) 03/14/2009 2303   PROTEINUR >300 (A) 03/14/2009 2303   UROBILINOGEN 0.2 03/14/2009 2303   NITRITE NEGATIVE 03/14/2009 2303   LEUKOCYTESUR NEGATIVE 03/14/2009 2303   Radiological Exams on Admission: Dg Chest 1 View  Result Date: 10/12/2015 CLINICAL DATA:  Patient with dementia fell earlier today. SOB and congested sounding while laying down for other imaging. Bilateral lower leg/ankle swelling. Hx HTN, CHF, and Diabetes. EXAM: CHEST 1 VIEW COMPARISON:  02/27/2010 FINDINGS: Cardiac silhouette is normal in size. The aorta is mildly uncoiled with calcification throughout the thoracic aorta. No mediastinal or hilar masses or convincing adenopathy. Lungs show bilateral irregular interstitial thickening. There is central mild vascular congestion. No focal consolidation is seen to suggest pneumonia. No convincing pleural  effusion. No pneumothorax. Bony thorax is demineralized but grossly intact. IMPRESSION: 1. Mild central vascular congestion with bilateral interstitial thickening. Findings are similar to prior exams. No convincing pulmonary edema/congestive heart failure. No evidence of pneumonia. Electronically Signed   By: Amie Portland M.D.   On: 10/12/2015 15:42   Dg Wrist Complete Right  Result Date: 10/12/2015 CLINICAL DATA:  Right wrist deformity and bruising after fall today. EXAM: RIGHT WRIST - COMPLETE 3+ VIEW COMPARISON:  None. FINDINGS: Posterior displacement of comminuted distal radial fracture is noted. This appears to be closed and posttraumatic. Severe narrowing of radiocarpal joint is noted suggesting degenerative change. No soft tissue abnormality is noted. IMPRESSION: Moderately displaced and comminuted distal right radial fracture is noted. Electronically Signed   By: Lupita Raider, M.D.   On: 10/12/2015 15:41   Ct Head Wo Contrast  Result Date: 10/12/2015 CLINICAL DATA:  Status post fall yesterday getting into a wheelchair. Initial encounter. EXAM: CT HEAD WITHOUT CONTRAST CT CERVICAL SPINE WITHOUT CONTRAST TECHNIQUE: Multidetector CT imaging of the head and cervical spine was performed following the standard protocol without intravenous contrast. Multiplanar CT image reconstructions of the cervical spine were also generated. COMPARISON:  Head CT scan 03/02/2010. FINDINGS: CT HEAD FINDINGS There is cortical atrophy and chronic microvascular ischemic change. No evidence of acute intracranial abnormality occlusion hemorrhage, infarct, mass lesion, mass effect, midline shift or abnormal extra-axial fluid collection is identified. The calvarium is intact. Imaged paranasal sinuses and mastoid air cells are clear. CT CERVICAL SPINE FINDINGS No fracture is identified. There is  severe multilevel spondylosis. The patient is status post posterior decompression C4-5 the C6-7. Lung apices demonstrate partial  visualization of small appearing bilateral pleural effusions. Extensive atherosclerosis is noted. IMPRESSION: No acute abnormality head or cervical spine. Advanced multilevel spondylosis with postoperative change of decompression C4-C6 identified. Bilateral pleural effusions appear small but are incompletely seen. Extensive atherosclerosis. Electronically Signed   By: Drusilla Kanner M.D.   On: 10/12/2015 15:00   Ct Cervical Spine Wo Contrast  Result Date: 10/12/2015 CLINICAL DATA:  Status post fall yesterday getting into a wheelchair. Initial encounter. EXAM: CT HEAD WITHOUT CONTRAST CT CERVICAL SPINE WITHOUT CONTRAST TECHNIQUE: Multidetector CT imaging of the head and cervical spine was performed following the standard protocol without intravenous contrast. Multiplanar CT image reconstructions of the cervical spine were also generated. COMPARISON:  Head CT scan 03/02/2010. FINDINGS: CT HEAD FINDINGS There is cortical atrophy and chronic microvascular ischemic change. No evidence of acute intracranial abnormality occlusion hemorrhage, infarct, mass lesion, mass effect, midline shift or abnormal extra-axial fluid collection is identified. The calvarium is intact. Imaged paranasal sinuses and mastoid air cells are clear. CT CERVICAL SPINE FINDINGS No fracture is identified. There is severe multilevel spondylosis. The patient is status post posterior decompression C4-5 the C6-7. Lung apices demonstrate partial visualization of small appearing bilateral pleural effusions. Extensive atherosclerosis is noted. IMPRESSION: No acute abnormality head or cervical spine. Advanced multilevel spondylosis with postoperative change of decompression C4-C6 identified. Bilateral pleural effusions appear small but are incompletely seen. Extensive atherosclerosis. Electronically Signed   By: Drusilla Kanner M.D.   On: 10/12/2015 15:00   Dg Knee Right Port  Result Date: 10/12/2015 CLINICAL DATA:  Status post fall yesterday  with a right femoral neck fracture. Right knee pain. Initial encounter. EXAM: PORTABLE RIGHT KNEE - 1-2 VIEW COMPARISON:  None. FINDINGS: No acute bony or joint abnormality is seen. Degenerative change is present about the knee. No joint effusion. Atherosclerosis noted. IMPRESSION: No acute abnormality. Electronically Signed   By: Drusilla Kanner M.D.   On: 10/12/2015 17:55   Dg Hip Unilat W Or Wo Pelvis 2-3 Views Right  Result Date: 10/12/2015 CLINICAL DATA:  Dementia, fall today EXAM: DG HIP (WITH OR WITHOUT PELVIS) 2-3V RIGHT COMPARISON:  None. FINDINGS: Four views of the right hip submitted. There is diffuse osteopenia. There is displaced fracture of the right femoral neck. Mild degenerative changes pubic symphysis. IMPRESSION: Displaced fracture of the right femoral neck. Electronically Signed   By: Natasha Mead M.D.   On: 10/12/2015 15:40    EKG: pending  Assessment/Plan Principal Problem:   Hip fracture (HCC) Active Problems:   Right wrist fracture   Diabetic peripheral neuropathy associated with type 2 diabetes mellitus (HCC)   GERD   Chronic diastolic congestive heart failure (HCC)   Vascular dementia   Chronic pain   Glaucoma   Depression with anxiety   Dyslipidemia associated with type 2 diabetes mellitus (HCC)   Hypertensive heart disease with CHF (congestive heart failure) (HCC)   Chronic kidney disease, stage 4, severely decreased GFR (HCC)   Leukocytosis   Right Hip Fracture - Orthopaedics consulted - OR tonight or in am - pain management with IV morphine - holding DVT prophylaxis and is NPO currently  Right Wrist Fracture - Hand surgery has been consulted- appreciate their recommendations - currently in sling - holding DVT prophylaxis and NPO currently  Hypertension - continue lopressor at lower rate of 25mg  twice daily, norvasc 10 mg daily, continue lisinopril  10 mg daily  and clonidine    Chronic diastolic heart failure - continue lasix 40 mg twice daily -  lopressor 25 mg twice daily  -imdur 60 mg daily   - last echo showed 2011: EF 60%   Leukocytosis - will repeat CBC in am - could be stress marginalization - afebrile, normotensive, not tachycardic - no infectious source or etiology noted  Constipation - Miralax   Hypokalemia - BMP in am  GERD - prilosec 40 mg daily   Dyslipidemia - tricor 145 mg daily - will continue  lipitor 10 mg daily  Diabetes - most recent hgb a1c is 6.9 - decrease lantus from 32 unit to 20 units as patient is NPO - mild sliding scale  Depression with anxiety - wellbutrin  75 mg twice daily - ativan 0.5 mg daily prior to AM care  Vascular dementia - continue namenda xr 14 mg daily   Chronic pain - continue oxycontin 30 mg twice daily - neurontin 100 mg twice daily - lidoderm patch  - oxycodone 5 mg every 8 hours as needed   Glaucoma - continue latanoprost to both eyes nightly and cosopt to both eyes twice daily .    Diabetic peripheral neuropathy - continue neurontin 100 mg twice daily    DVT prophylaxis: holding for surgery  Code Status: DNR  Family Communication: called Judy Pimple [phone] patients wife and discussed plan of care Disposition Plan: back to [facility] (specify when and where you expect patient to be discharged) Consults called: Orthopaedics and Hand Surgery  Admission status: Inpatient to med/surg floor   Bennett Scrape MD [facility] Pager [phone]  If 7PM-7AM, please contact night-coverage [email or web address] Password [facility]  10/12/2015, 6:55 PM

## 2015-10-12 NOTE — Progress Notes (Signed)
Paged ortho tech at this time to obtain overhead helper/ trapeze per orders

## 2015-10-12 NOTE — Progress Notes (Signed)
Orthopedic Tech Progress Note Patient Details:  Sherlie BanMelvin T Bryk February 28, 1930 [id number]  Ortho Devices Type of Ortho Device: Ace wrap, Arm sling, Sugartong splint Ortho Device/Splint Location: RUE Ortho Device/Splint Interventions: Ordered, Application   Jennye MoccasinHughes, Garrin Kirwan Craig 10/12/2015, 4:37 PM

## 2015-10-13 DIAGNOSIS — W19XXXA Unspecified fall, initial encounter: Secondary | ICD-10-CM

## 2015-10-13 LAB — CBC
HCT: 34.7 % — ABNORMAL LOW (ref 39.0–52.0)
Hemoglobin: 11.6 g/dL — ABNORMAL LOW (ref 13.0–17.0)
MCH: 31.1 pg (ref 26.0–34.0)
MCHC: 33.4 g/dL (ref 30.0–36.0)
MCV: 93 fL (ref 78.0–100.0)
PLATELETS: 130 10*3/uL — AB (ref 150–400)
RBC: 3.73 MIL/uL — ABNORMAL LOW (ref 4.22–5.81)
RDW: 15.3 % (ref 11.5–15.5)
WBC: 22.6 10*3/uL — AB (ref 4.0–10.5)

## 2015-10-13 LAB — BASIC METABOLIC PANEL
ANION GAP: 13 (ref 5–15)
BUN: 34 mg/dL — ABNORMAL HIGH (ref 6–20)
CALCIUM: 9.4 mg/dL (ref 8.9–10.3)
CO2: 23 mmol/L (ref 22–32)
CREATININE: 2.08 mg/dL — AB (ref 0.61–1.24)
Chloride: 103 mmol/L (ref 101–111)
GFR, EST AFRICAN AMERICAN: 32 mL/min — AB (ref >60)
GFR, EST NON AFRICAN AMERICAN: 28 mL/min — AB (ref >60)
Glucose, Bld: 198 mg/dL — ABNORMAL HIGH (ref 65–99)
Potassium: 4.1 mmol/L (ref 3.5–5.1)
SODIUM: 139 mmol/L (ref 135–145)

## 2015-10-13 LAB — GLUCOSE, CAPILLARY
GLUCOSE-CAPILLARY: 164 mg/dL — AB (ref 65–99)
GLUCOSE-CAPILLARY: 205 mg/dL — AB (ref 65–99)
GLUCOSE-CAPILLARY: 236 mg/dL — AB (ref 65–99)
Glucose-Capillary: 205 mg/dL — ABNORMAL HIGH (ref 65–99)

## 2015-10-13 MED ORDER — HYDROCODONE-ACETAMINOPHEN 5-325 MG PO TABS: 1.0000 | ORAL_TABLET | Freq: Three times a day (TID) | ORAL | Status: DC | PRN

## 2015-10-13 MED ORDER — INSULIN GLARGINE 100 UNIT/ML ~~LOC~~ SOLN
30.0000 [IU] | Freq: Every day | SUBCUTANEOUS | Status: DC
  Administered 2015-10-13: 30 [IU] via SUBCUTANEOUS
  Filled 2015-10-13 (×2): qty 0.3

## 2015-10-13 MED ORDER — HYDROCODONE-ACETAMINOPHEN 5-325 MG PO TABS: 1.0000 | ORAL_TABLET | Freq: Four times a day (QID) | ORAL | Status: DC | PRN

## 2015-10-13 MED ORDER — FENOFIBRATE 160 MG PO TABS
160.0000 mg | ORAL_TABLET | Freq: Every day | ORAL | Status: DC
Start: 2015-10-13 — End: 2015-10-14
  Administered 2015-10-13 – 2015-10-14 (×2): 160 mg via ORAL
  Filled 2015-10-13 (×2): qty 1

## 2015-10-13 MED ORDER — INSULIN ASPART 100 UNIT/ML ~~LOC~~ SOLN
4.0000 [IU] | Freq: Three times a day (TID) | SUBCUTANEOUS | Status: DC
  Administered 2015-10-13 – 2015-10-14 (×3): 4 [IU] via SUBCUTANEOUS

## 2015-10-13 MED ORDER — INSULIN ASPART 100 UNIT/ML ~~LOC~~ SOLN
0.0000 [IU] | Freq: Three times a day (TID) | SUBCUTANEOUS | Status: DC
Start: 2015-10-13 — End: 2015-10-14
  Administered 2015-10-13: 3 [IU] via SUBCUTANEOUS
  Administered 2015-10-14: 1 [IU] via SUBCUTANEOUS
  Administered 2015-10-14: 3 [IU] via SUBCUTANEOUS

## 2015-10-13 MED ORDER — INSULIN ASPART 100 UNIT/ML ~~LOC~~ SOLN
0.0000 [IU] | Freq: Every day | SUBCUTANEOUS | Status: DC
  Administered 2015-10-13: 2 [IU] via SUBCUTANEOUS

## 2015-10-13 NOTE — Progress Notes (Signed)
PT Cancellation Note  Patient Details Name: [name] MRN: [id number] DOB: May 16, 1929   Cancelled Treatment:    Reason Eval/Treat Not Completed: Patient not medically ready Pt possibly going to OR today. Per RN, awaiting response from ortho. Will follow up to see what the plan is prior to initiation of PT evaluation.   Blake DivineShauna A Sophiarose Eades 10/13/2015, 10:58 AM  Mylo RedShauna Remmie Bembenek, PT, DPT [phone]

## 2015-10-13 NOTE — Care Management Note (Signed)
Case Management Note  Patient Details  Name: [name] MRN: [id number] Date of Birth: 1929/12/27  Subjective/Objective:    right distal radius fracture, falls               Action/Plan: Discharge Planning:  Chart reviewed. Scheduled dc to back to SNF[facility] on 10/14/2015 with oxygen. Contacted CSW for weekend CSW to follow up with dc back to SNF.    Expected Discharge Date:  10/14/2015               Expected Discharge Plan:  Skilled Nursing Facility  In-House Referral:  Clinical Social Work  Discharge planning Services  CM Consult  Post Acute Care Choice:  NA Choice offered to:  NA  DME Arranged:  N/A DME Agency:  NA  HH Arranged:  NA HH Agency:  NA  Status of Service:  Completed, signed off  If discussed at [employer] Length [employer] Stay Meetings, dates discussed:    Additional Comments:  [name], [name], RN 10/13/2015, 4:19 PM

## 2015-10-13 NOTE — Consult Note (Signed)
[name] is an 82 y.o. male.   Chief Complaint: right distal radius fracture HPI: 82 yo rhd male states he fell 2 days ago injuring right wrist.  Seen in ED yesterday and admitted due to hip fracture.  He reports no previous injury to right wrist.  Throbbing, aching pain that approaches 10/10 in severity.  Alleviated and aggravated by nothing.  Xrays viewed and interpreted by me: ap, lateral, oblique views right wrist from yesterday show distal radius fracture with dorsal displacement and angulation. Labs reviewed: none  Allergies: No Known Allergies  Past Medical History:  Diagnosis Date  . 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 Anxiety   . At high risk for falls   . Chronic diastolic congestive heart failure ([place]) 06/13/2012   Echo in 2011 with grade 1 diastolic dysfunction and normal systolic function with EF 60%.     . DDD (degenerative disc disease), lumbosacral   . Depression   . Diabetic peripheral neuropathy ([place])   . Dyslipidemia   . GERD (gastroesophageal reflux disease)   . History of colon polyps   . History of diabetic ulcer of foot   . History of MRSA infection    Hx chronic diabetic ulcer's secondary MRSA  . Hypertension   . Insulin dependent type 2 diabetes mellitus ([place])   . Peripheral vascular disease ([place])   . Portal hypertension ([place]) 01/15/2010   Qualifier: Diagnosis of  By: Loanne Drilling MD, Jacelyn Pi   . Renal insufficiency   . Ulcer of right ankle ([place])   . UNSPECIFIED PERIPHERAL VASCULAR DISEASE 01/15/2010   Qualifier: Diagnosis of  By: Loanne Drilling MD, Jacelyn Pi   . Wheelchair dependent     Past Surgical History:  Procedure Laterality Date  . COLONOSCOPY WITH ESOPHAGOGASTRODUODENOSCOPY (EGD)  09-21-2002  . INCISION AND DRAINAGE OF WOUND Right 01/10/2014   Procedure: IRRIGATION AND DEBRIDEMENT RIGHT ANKLE WOUND WITH PLACEMENT OF ACELL;  Surgeon: Theodoro Kos, DO;  Location: [facility];  Service: Plastics;  Laterality: Right;  . INGUINAL HERNIA  REPAIR Left 11-30-2001  . LAPAROSCOPIC CHOLECYSTECTOMY    . TOE AMPUTATION    . TRANSTHORACIC ECHOCARDIOGRAM  12-20-2009   severe LVH/  ef 16%/  grade I diastolic dysfunction/  AV sclerosis without stenosis/  mild LAE and RAE/      Family History: Family History  Problem Relation Age of Onset  . Diabetes Neg Hx     No DM in immediate family    Social History:   reports that he has quit smoking. His smoking use included Cigarettes. He has never used smokeless tobacco. He reports that he does not drink alcohol or use drugs.  Medications: Medications Prior to Admission  Medication Sig Dispense Refill  . amLODipine (NORVASC) 5 MG tablet Take 10 mg by mouth every morning.     Marland Kitchen ascorbic acid (VITAMIN C) 500 MG tablet Take 500 mg by mouth 2 (two) times daily.    Marland Kitchen aspirin 81 MG tablet Take 81 mg by mouth every morning.     Marland Kitchen atorvastatin (LIPITOR) 10 MG tablet Take 1 tablet (10 mg total) by mouth daily. 90 tablet 3  . buPROPion (WELLBUTRIN) 75 MG tablet Take 75 mg by mouth 2 (two) times daily.     . cloNIDine (CATAPRES) 0.1 MG tablet Give 0.2 mg by mouth every morning (0800) and 0.1 mg by mouth every evening (1800)    . coal tar (NEUTROGENA T-GEL) 0.5 % shampoo Apply topically. Apply topically every 96  hours for dandruff weekly as needed    . diclofenac sodium (VOLTAREN) 1 % GEL Apply 2 g topically 3 (three) times daily. To right wrist    . dorzolamide-timolol (COSOPT) 22.3-6.8 MG/ML ophthalmic solution Place 1 drop into both eyes 2 (two) times daily.    . fenofibrate (TRICOR) 145 MG tablet Take 145 mg by mouth every evening.     . furosemide (LASIX) 40 MG tablet Take 40 mg by mouth 2 (two) times daily.     . insulin aspart (NOVOLOG) 100 UNIT/ML injection Inject 5 Units into the skin 3 (three) times daily before meals. Inject as per sliding scale: if  0-150 =0 units; 151-450 = 8 units, subcutaneously before meals related to DM. Call MD for BS> 450    . Insulin Glargine (LANTUS SOLOSTAR) 100  UNIT/ML Solostar Pen Inject 35 Units into the skin daily at 10 pm. (Patient taking differently: Inject 32 Units into the skin daily at 10 pm. ) 5 pen PRN  . isosorbide mononitrate (IMDUR) 60 MG 24 hr tablet Take 60 mg by mouth every morning.     . latanoprost (XALATAN) 0.005 % ophthalmic solution Place 1 drop into both eyes at bedtime.      . lidocaine (LIDODERM) 5 % Place 1 patch onto the skin every 12 (twelve) hours. Remove & Discard patch within 12 hours to right neck  And to left hip    . lisinopril (PRINIVIL,ZESTRIL) 5 MG tablet Take 10 mg by mouth daily.     . LORazepam (ATIVAN) 0.5 MG tablet Take 0.5 mg by mouth daily. Prior to AM care    . Memantine HCl ER (NAMENDA XR) 14 MG CP24 Take 1 tablet by mouth every morning.    . metoprolol (LOPRESSOR) 50 MG tablet Take 50 mg by mouth 2 (two) times daily.    . nystatin (MYCOSTATIN/NYSTOP) powder Apply topically 2 (two) times daily.    . omeprazole (PRILOSEC) 20 MG capsule Take 40 mg by mouth every morning.     . ondansetron (ZOFRAN) 4 MG tablet Take 4 mg by mouth every 8 (eight) hours as needed for nausea or vomiting.    . oxyCODONE (OXYCONTIN) 30 MG 12 hr tablet Take one tablet by mouth every 12 hours for pain NO NOT CUT/CRUSH/CHEW 60 each 0  . polyethylene glycol powder (GLYCOLAX/MIRALAX) powder Take 17 g by mouth 2 (two) times daily.     . potassium chloride (KLOR-CON) 20 MEQ packet Take 20 mEq by mouth 2 (two) times daily.    . potassium chloride SA (KLOR-CON M20) 20 MEQ tablet Take 20 mEq by mouth 2 (two) times daily.    . protein supplement (PROMOD) POWD Take 1 scoop by mouth 2 (two) times daily.    . senna (SENOKOT) 8.6 MG tablet Take 2 tablets by mouth 2 (two) times daily.     . torsemide (DEMADEX) 20 MG tablet Take 20 mg by mouth 2 (two) times daily.    . gabapentin (NEURONTIN) 100 MG capsule Take 100 mg by mouth 2 (two) times daily.    . Nutritional Supplements (NUTRITIONAL SUPPLEMENT PO) Take by mouth. HSG Mech Soft Texture, for diet     . oxycodone (OXY-IR) 5 MG capsule Take one tablet by mouth every 8 hours as needed for pain. (Patient not taking: Reported on 10/12/2015) 90 capsule 0    Results for orders placed or performed during the hospital encounter of 10/12/15 (from the past 48 hour(s))  Basic metabolic panel       Status: Abnormal   Collection Time: 10/12/15  3:56 PM  Result Value Ref Range   Sodium 134 (L) 135 - 145 mmol/L   Potassium 4.2 3.5 - 5.1 mmol/L   Chloride 102 101 - 111 mmol/L   CO2 25 22 - 32 mmol/L   Glucose, Bld 286 (H) 65 - 99 mg/dL   BUN 35 (H) 6 - 20 mg/dL   Creatinine, Ser 2.50 (H) 0.61 - 1.24 mg/dL   Calcium 9.4 8.9 - 10.3 mg/dL   GFR calc non Af Amer 22 (L) >60 mL/min   GFR calc Af Amer 26 (L) >60 mL/min    Comment: (NOTE) The eGFR has been calculated using the CKD EPI equation. This calculation has not been validated in all clinical situations. eGFR's persistently <60 mL/min signify possible Chronic Kidney Disease.    Anion gap 7 5 - 15  CBC with Differential     Status: Abnormal   Collection Time: 10/12/15  3:56 PM  Result Value Ref Range   WBC 27.0 (H) 4.0 - 10.5 K/uL   RBC 3.77 (L) 4.22 - 5.81 MIL/uL   Hemoglobin 11.8 (L) 13.0 - 17.0 g/dL   HCT 34.9 (L) 39.0 - 52.0 %   MCV 92.6 78.0 - 100.0 fL   MCH 31.3 26.0 - 34.0 pg   MCHC 33.8 30.0 - 36.0 g/dL   RDW 15.3 11.5 - 15.5 %   Platelets 155 150 - 400 K/uL   Neutrophils Relative % 70 %   Lymphocytes Relative 24 %   Monocytes Relative 5 %   Eosinophils Relative 1 %   Basophils Relative 0 %   Neutro Abs 18.8 (H) 1.7 - 7.7 K/uL   Lymphs Abs 6.5 (H) 0.7 - 4.0 K/uL   Monocytes Absolute 1.4 (H) 0.1 - 1.0 K/uL   Eosinophils Absolute 0.3 0.0 - 0.7 K/uL   Basophils Absolute 0.0 0.0 - 0.1 K/uL   Smear Review MORPHOLOGY UNREMARKABLE   Type and screen [facility]     Status: None   Collection Time: 10/12/15  7:58 PM  Result Value Ref Range   ABO/RH(D) O POS    Antibody Screen NEG    Sample Expiration 10/15/2015    Magnesium     Status: None   Collection Time: 10/12/15  7:58 PM  Result Value Ref Range   Magnesium 1.9 1.7 - 2.4 mg/dL  ABO/Rh     Status: None   Collection Time: 10/12/15  7:58 PM  Result Value Ref Range   ABO/RH(D) O POS   Glucose, capillary     Status: Abnormal   Collection Time: 10/12/15 11:07 PM  Result Value Ref Range   Glucose-Capillary 213 (H) 65 - 99 mg/dL  CBC     Status: Abnormal   Collection Time: 10/13/15  5:04 AM  Result Value Ref Range   WBC 22.6 (H) 4.0 - 10.5 K/uL   RBC 3.73 (L) 4.22 - 5.81 MIL/uL   Hemoglobin 11.6 (L) 13.0 - 17.0 g/dL   HCT 34.7 (L) 39.0 - 52.0 %   MCV 93.0 78.0 - 100.0 fL   MCH 31.1 26.0 - 34.0 pg   MCHC 33.4 30.0 - 36.0 g/dL   RDW 15.3 11.5 - 15.5 %   Platelets 130 (L) 150 - 400 K/uL  Basic metabolic panel     Status: Abnormal   Collection Time: 10/13/15  5:04 AM  Result Value Ref Range   Sodium 139 135 - 145 mmol/L   Potassium 4.1 3.5 -   5.1 mmol/L   Chloride 103 101 - 111 mmol/L   CO2 23 22 - 32 mmol/L   Glucose, Bld 198 (H) 65 - 99 mg/dL   BUN 34 (H) 6 - 20 mg/dL   Creatinine, Ser 2.08 (H) 0.61 - 1.24 mg/dL   Calcium 9.4 8.9 - 10.3 mg/dL   GFR calc non Af Amer 28 (L) >60 mL/min   GFR calc Af Amer 32 (L) >60 mL/min    Comment: (NOTE) The eGFR has been calculated using the CKD EPI equation. This calculation has not been validated in all clinical situations. eGFR's persistently <60 mL/min signify possible Chronic Kidney Disease.    Anion gap 13 5 - 15  Glucose, capillary     Status: Abnormal   Collection Time: 10/13/15  6:40 AM  Result Value Ref Range   Glucose-Capillary 164 (H) 65 - 99 mg/dL  Glucose, capillary     Status: Abnormal   Collection Time: 10/13/15 12:20 PM  Result Value Ref Range   Glucose-Capillary 205 (H) 65 - 99 mg/dL   Comment 1 Repeat Test    Comment 2 Document in Chart     Dg Chest 1 View  Result Date: 10/12/2015 CLINICAL DATA:  Patient with dementia fell earlier today. SOB and congested sounding while  laying down for other imaging. Bilateral lower leg/ankle swelling. Hx HTN, CHF, and Diabetes. EXAM: CHEST 1 VIEW COMPARISON:  02/27/2010 FINDINGS: Cardiac silhouette is normal in size. The aorta is mildly uncoiled with calcification throughout the thoracic aorta. No mediastinal or hilar masses or convincing adenopathy. Lungs show bilateral irregular interstitial thickening. There is central mild vascular congestion. No focal consolidation is seen to suggest pneumonia. No convincing pleural effusion. No pneumothorax. Bony thorax is demineralized but grossly intact. IMPRESSION: 1. Mild central vascular congestion with bilateral interstitial thickening. Findings are similar to prior exams. No convincing pulmonary edema/congestive heart failure. No evidence of pneumonia. Electronically Signed   By: David  Ormond M.D.   On: 10/12/2015 15:42   Dg Wrist Complete Right  Result Date: 10/12/2015 CLINICAL DATA:  Right wrist deformity and bruising after fall today. EXAM: RIGHT WRIST - COMPLETE 3+ VIEW COMPARISON:  None. FINDINGS: Posterior displacement of comminuted distal radial fracture is noted. This appears to be closed and posttraumatic. Severe narrowing of radiocarpal joint is noted suggesting degenerative change. No soft tissue abnormality is noted. IMPRESSION: Moderately displaced and comminuted distal right radial fracture is noted. Electronically Signed   By: James  Green Jr, M.D.   On: 10/12/2015 15:41   Ct Head Wo Contrast  Result Date: 10/12/2015 CLINICAL DATA:  Status post fall yesterday getting into a wheelchair. Initial encounter. EXAM: CT HEAD WITHOUT CONTRAST CT CERVICAL SPINE WITHOUT CONTRAST TECHNIQUE: Multidetector CT imaging of the head and cervical spine was performed following the standard protocol without intravenous contrast. Multiplanar CT image reconstructions of the cervical spine were also generated. COMPARISON:  Head CT scan 03/02/2010. FINDINGS: CT HEAD FINDINGS There is cortical atrophy  and chronic microvascular ischemic change. No evidence of acute intracranial abnormality occlusion hemorrhage, infarct, mass lesion, mass effect, midline shift or abnormal extra-axial fluid collection is identified. The calvarium is intact. Imaged paranasal sinuses and mastoid air cells are clear. CT CERVICAL SPINE FINDINGS No fracture is identified. There is severe multilevel spondylosis. The patient is status post posterior decompression C4-5 the C6-7. Lung apices demonstrate partial visualization of small appearing bilateral pleural effusions. Extensive atherosclerosis is noted. IMPRESSION: No acute abnormality head or cervical spine. Advanced multilevel spondylosis with postoperative   change of decompression C4-C6 identified. Bilateral pleural effusions appear small but are incompletely seen. Extensive atherosclerosis. Electronically Signed   By: Inge Rise M.D.   On: 10/12/2015 15:00   Ct Cervical Spine Wo Contrast  Result Date: 10/12/2015 CLINICAL DATA:  Status post fall yesterday getting into a wheelchair. Initial encounter. EXAM: CT HEAD WITHOUT CONTRAST CT CERVICAL SPINE WITHOUT CONTRAST TECHNIQUE: Multidetector CT imaging of the head and cervical spine was performed following the standard protocol without intravenous contrast. Multiplanar CT image reconstructions of the cervical spine were also generated. COMPARISON:  Head CT scan 03/02/2010. FINDINGS: CT HEAD FINDINGS There is cortical atrophy and chronic microvascular ischemic change. No evidence of acute intracranial abnormality occlusion hemorrhage, infarct, mass lesion, mass effect, midline shift or abnormal extra-axial fluid collection is identified. The calvarium is intact. Imaged paranasal sinuses and mastoid air cells are clear. CT CERVICAL SPINE FINDINGS No fracture is identified. There is severe multilevel spondylosis. The patient is status post posterior decompression C4-5 the C6-7. Lung apices demonstrate partial visualization of  small appearing bilateral pleural effusions. Extensive atherosclerosis is noted. IMPRESSION: No acute abnormality head or cervical spine. Advanced multilevel spondylosis with postoperative change of decompression C4-C6 identified. Bilateral pleural effusions appear small but are incompletely seen. Extensive atherosclerosis. Electronically Signed   By: Inge Rise M.D.   On: 10/12/2015 15:00   Dg Knee Right Port  Result Date: 10/12/2015 CLINICAL DATA:  Status post fall yesterday with a right femoral neck fracture. Right knee pain. Initial encounter. EXAM: PORTABLE RIGHT KNEE - 1-2 VIEW COMPARISON:  None. FINDINGS: No acute bony or joint abnormality is seen. Degenerative change is present about the knee. No joint effusion. Atherosclerosis noted. IMPRESSION: No acute abnormality. Electronically Signed   By: Inge Rise M.D.   On: 10/12/2015 17:55   Dg Hip Unilat W Or Wo Pelvis 2-3 Views Right  Result Date: 10/12/2015 CLINICAL DATA:  Dementia, fall today EXAM: DG HIP (WITH OR WITHOUT PELVIS) 2-3V RIGHT COMPARISON:  None. FINDINGS: Four views of the right hip submitted. There is diffuse osteopenia. There is displaced fracture of the right femoral neck. Mild degenerative changes pubic symphysis. IMPRESSION: Displaced fracture of the right femoral neck. Electronically Signed   By: Lahoma Crocker M.D.   On: 10/12/2015 15:40     A comprehensive review of systems was negative. Review of Systems: No fevers, chills, night sweats, chest pain, shortness of breath, nausea, vomiting, diarrhea, constipation, easy bleeding or bruising, headaches, dizziness, vision changes, fainting.   Blood pressure 122/63, pulse 60, temperature 99.7 F (37.6 C), temperature source Oral, resp. rate 20, height 6' 3" (1.905 m), weight 108.9 kg (240 lb), SpO2 93 %.  General appearance: alert, cooperative and appears stated age Head: Normocephalic, without obvious abnormality, atraumatic Neck: supple, symmetrical, trachea  midline Extremities: Intact sensation and capillary refill all digits.  +epl/fpl/io.  No wounds. Ecchymosis right wrist.  Swelling.  Compartments soft. Pulses: 2+ and symmetric Skin: Skin color, texture, turgor normal. No rashes or lesions Neurologic: Grossly normal Incision/Wound: none  Assessment/Plan Right distal radius fracture.  Discussed non operative and operative treatment options.  Risks, benefits, and alternatives of surgery were discussed.  Patient's family has chosen non operative care for hip fracture due to non ambulatory status.  Will follow up in office next week and discuss with family and patient options for treatment of distal radius fracture.  Jacquan Savas [name] 10/13/2015, 1:40 PM

## 2015-10-13 NOTE — Progress Notes (Signed)
OT Cancellation Note  Patient Details Name: Sherlie BanMelvin T Teston MRN: [id number] DOB: [birth year]   Cancelled Treatment:    Reason Eval/Treat Not Completed: Patient not medically ready;Other (comment). Patient not medically ready Pt possibly going to OR today. Per RN, awaiting response from ortho. Will follow up to see what the plan is prior to initiation of OT evaluation. Margaretmary EddySpencer, Saverio Kader [facility] 10/13/2015, 11:26 AM

## 2015-10-13 NOTE — Progress Notes (Signed)
Initial Nutrition Assessment  DOCUMENTATION CODES:   Obesity unspecified  INTERVENTION:  RD to provide nutritional supplementation as appropriate once diet advances.   NUTRITION DIAGNOSIS:   Increased nutrient needs related to chronic illness as evidenced by estimated needs.  GOAL:   Patient will meet greater than or equal to 90% of their needs  MONITOR:   Diet advancement, Labs, Weight trends, Skin, I & O's  REASON FOR ASSESSMENT:   Consult Hip fracture protocol  ASSESSMENT:   82 y.o. male with medical history significant of CAD, vascular dementia, insulin Type II DM, HTN, chronic diastolic CHF with preserved EF whom was [facility] in [facility] after a fall yesterday. Pt with R hip fx and R wrist fx.   Pt is currently NPO for possible surgery today on R wrist. Pt was asleep during time of visit and did not wake. Family at bedside. They report pt has been difficult for pt to wake since admission. Plan for non-operative treatment on R hip. Wife reports pt was eating well PTA with usual consumption of at least 3 meals a day with consuming of nutritional shakes only on occasion. Weight has been stable. Pt with no observed significant fat or muscle mass loss. RD to provide nutritional supplements if po intake is poor once diet advances.   Labs and medications reviewed.   Diet Order:  Diet NPO time specified Except for: Sips with Meds  Skin:  Reviewed, no issues  Last BM:  8/11  Height:   Ht Readings from Last 1 Encounters:  10/12/15 6\' 3"  (1.905 m)    Weight:   Wt Readings from Last 1 Encounters:  10/12/15 240 lb (108.9 kg)    Ideal Body Weight:  89 kg  BMI:  Body mass index is 30 kg/m.  Estimated Nutritional Needs:   Kcal:  2000-2200  Protein:  90-110 grams  Fluid:  2- 2.2 L/day  EDUCATION NEEDS:   No education needs identified at this time  Roslyn SmilingStephanie Marielis Samara, MS, RD, LDN Pager # [phone] After hours/ weekend pager # [phone]

## 2015-10-13 NOTE — Progress Notes (Signed)
Inpatient Diabetes Program Recommendations  AACE/ADA: New Consensus Statement on Inpatient Glycemic Control (2015)  Target Ranges:  Prepandial:   less than 140 mg/dL      Peak postprandial:   less than 180 mg/dL (1-2 hours)      Critically ill patients:  140 - 180 mg/dL   Lab Results  Component Value Date   GLUCAP 164 (H) 10/13/2015   HGBA1C 6.2 08/30/2015    Review of Glycemic Control:  Results for [name] BurrowMAY, Keavon T (MRN [id number]) as of 10/13/2015 09:55  Ref. Range 10/12/2015 23:07 10/13/2015 06:40  Glucose-Capillary Latest Ref Range: 65 - 99 mg/dL 956213 (H) 213164 (H)    Diabetes history: Type 2 diabetes Outpatient Diabetes medications: Lantus 32 units q HS, Novolog-0-150- 0 units, 151-400- 8 units tid with meals Current orders for Inpatient glycemic control:  Lantus 20 units daily  Inpatient Diabetes Program Recommendations:    Please consider adding Novolog sensitive correction q 4 hours while patient is NPO.  Sent Text page to MD.   Thanks, Beryl MeagerJenny Rohail Klees, RN, BC-ADM Inpatient Diabetes Coordinator Pager [phone] (8a-5p)

## 2015-10-13 NOTE — Progress Notes (Signed)
PROGRESS NOTE    [name]  KGM:[id number] DOB: Ancrum 20, 1931 DOA: 10/12/2015 PCP: Kirt Boys, DO    Brief Narrative: 82 year old male with PMH of admitted for displaced fracture of right femoral neck, displaced comminuted fracture of distal right radius. Ortho and hand surgery were consulted.  Patient was seen and evaluated by orthopaedic surgery and given risks of surgery and since patient is bedbound normally decision between surgeon and family to not proceed with surgical correction of femur fracture was made.  Awaiting evaluation of distal radial fracture by hand surgeon.   Assessment & Plan:   Principal Problem:   Hip fracture (HCC) Active Problems:   Right wrist fracture   Diabetic peripheral neuropathy associated with type 2 diabetes mellitus (HCC)   GERD   Chronic diastolic congestive heart failure (HCC)   Vascular dementia   Chronic pain   Glaucoma   Depression with anxiety   Dyslipidemia associated with type 2 diabetes mellitus (HCC)   Hypertensive heart disease with CHF (congestive heart failure) (HCC)   Chronic kidney disease, stage 4, severely decreased GFR (HCC)   Leukocytosis   Right Hip Fracture - Orthopaedics consulted - nonoperative management - pain management with IV morphine, oxycodone  Hypoxemia - Patient is requiring 2L of oxygen however he is not wearing nasal cannula in nares - likely oxygen requirement 2/2 pain medication and while patient asleep - oxygen saturations WNL at this time - will monitor for signs of fluid overload or pneumonia as crackles heard on exam today.  Right Wrist Fracture - Hand surgery has been consulted- appreciate their recommendations - currently in sling - awaiting recommendations  Hypertension - continue lopressor at lower rate of  twice daily, norvasc 10 mg daily, continuelisinopril 10 mg daily and clonidine    Chronic diastolic heart failure - continue lasix 40 mg twice daily - lopressor 25 mg twice  daily  -imdur 60 mg daily  - last echo showed 2011: EF 60%  - fluids d/c'ed  Leukocytosis - WBC lower this morning, elevation likely from stress demarginalization - afebrile, normotensive, not tachycardic - no infectious source or etiology noted  Constipation - Miralax   Hypokalemia - K of 4.1 today  GERD - prilosec 40 mg daily   Dyslipidemia - tricor 145 mg daily - will continuelipitor 10 mg daily  Diabetes - most recenthgb a1c is 6.9 - decrease lantus from 32 unit to 20 units as patient is NPO - sensitive sliding scale - will change insulin when patient no longer NPO  Depression with anxiety - wellbutrin 75 mg twice daily - ativan 0.5 mg daily prior to AM care  Vascular dementia - continue namenda xr 14 mg daily   Chronic pain - continue oxycontin 30 mg twice daily - neurontin 100 mg twice daily - lidoderm patch  - oxycodone 5 mg every 8 hours as needed   Glaucoma - continue latanoprost to both eyes nightly and cosopt to both eyes twice daily .    Diabetic peripheral neuropathy - continue neurontin 100 mg twice daily      DVT prophylaxis:SCD's Code Status: DNR Family Communication: Spoke with patient's wife, Judy Pimple, as well as patient's granddaughter, Zella Ball,  bedside Disposition Plan: will go back to SNF when stable for discharge  Consultants:   Orthopaedic surgery  Hand surgery  Procedures:   none  Antimicrobials:   none    Subjective: Patient is resting comfortably in bed.  He awakens to name.  Denies any pain at this time.  Wife and granddaughter are bedside and voiced appropriate questions about treatment plan.  Would like to know if patient is to get surgery on his right wrist and hand.  Nurse states that this morning patient had a small period of time where his oxygen saturation was between 80-84% and so she put him on 2L [place].  Upon evaluation today he oxygen saturation was mid 90s.  Family states that in the nursing home  patient stays in his room constantly and does not leave.  He often just lays in bed and sleeps.  Objective: Vitals:   10/13/15 0027 10/13/15 0750 10/13/15 0838 10/13/15 0847  BP: (!) 159/64 (!) 143/77 122/63   Pulse: 76  60   Resp: 20     Temp: 99.7 F (37.6 C)     TempSrc: Oral     SpO2: 91%  (!) 84% 93%  Weight:      Height:        Intake/Output Summary (Last 24 hours) at 10/13/15 1043 Last data filed at 10/13/15 0654  Gross per 24 hour  Intake           986.25 ml  Output              250 ml  Net           736.25 ml   Filed Weights   10/12/15 1924  Weight: 108.9 kg (240 lb)    Examination:  General exam: Appears calm and comfortable  Respiratory system: Some basilar crackles bilaterally, poor respiratory effort Cardiovascular system: S1 & S2 heard No JVD, rubs, gallops or clicks. No edema Gastrointestinal system: Abdomen is nondistended, soft and nontender. No organomegaly or masses felt. Normal bowel sounds heard. Central nervous system: alert when awakened and would talk when awoken Extremities: Right arm in sugar tong splint still, SCD's in place,  Skin: No rashes, lesions or ulcers Psychiatry: Judgement and insight appear normal. Mood & affect appropriate.     Data Reviewed: I have personally reviewed following labs and imaging studies  CBC:  Recent Labs Lab 10/12/15 1556 10/13/15 0504  WBC 27.0* 22.6*  NEUTROABS 18.8*  --   HGB 11.8* 11.6*  HCT 34.9* 34.7*  MCV 92.6 93.0  PLT 155 130*   Basic Metabolic Panel:  Recent Labs Lab 10/12/15 1556 10/12/15 1958 10/13/15 0504  NA 134*  --  139  K 4.2  --  4.1  CL 102  --  103  CO2 25  --  23  GLUCOSE 286*  --  198*  BUN 35*  --  34*  CREATININE 2.50*  --  2.08*  CALCIUM 9.4  --  9.4  MG  --  1.9  --    GFR: Estimated Creatinine Clearance: 35.9 mL/min (by C-G formula based on SCr of 2.08 mg/dL). Liver Function Tests: No results for input(s): AST, ALT, ALKPHOS, BILITOT, PROT, ALBUMIN in the last  168 hours. No results for input(s): LIPASE, AMYLASE in the last 168 hours. No results for input(s): AMMONIA in the last 168 hours. Coagulation Profile: No results for input(s): INR, PROTIME in the last 168 hours. Cardiac Enzymes: No results for input(s): CKTOTAL, CKMB, CKMBINDEX, TROPONINI in the last 168 hours. BNP (last 3 results) No results for input(s): PROBNP in the last 8760 hours. HbA1C: No results for input(s): HGBA1C in the last 72 hours. CBG:  Recent Labs Lab 10/12/15 2307 10/13/15 0640  GLUCAP 213* 164*   Lipid Profile: No results for input(s): CHOL, HDL, LDLCALC, TRIG,  CHOLHDL, LDLDIRECT in the last 72 hours. Thyroid Function Tests: No results for input(s): TSH, T4TOTAL, FREET4, T3FREE, THYROIDAB in the last 72 hours. Anemia Panel: No results for input(s): VITAMINB12, FOLATE, FERRITIN, TIBC, IRON, RETICCTPCT in the last 72 hours. Sepsis Labs: No results for input(s): PROCALCITON, LATICACIDVEN in the last 168 hours.  No results found for this or any previous visit (from the past 240 hour(s)).       Radiology Studies: Dg Chest 1 View  Result Date: 10/12/2015 CLINICAL DATA:  Patient with dementia fell earlier today. SOB and congested sounding while laying down for other imaging. Bilateral lower leg/ankle swelling. Hx HTN, CHF, and Diabetes. EXAM: CHEST 1 VIEW COMPARISON:  02/27/2010 FINDINGS: Cardiac silhouette is normal in size. The aorta is mildly uncoiled with calcification throughout the thoracic aorta. No mediastinal or hilar masses or convincing adenopathy. Lungs show bilateral irregular interstitial thickening. There is central mild vascular congestion. No focal consolidation is seen to suggest pneumonia. No convincing pleural effusion. No pneumothorax. Bony thorax is demineralized but grossly intact. IMPRESSION: 1. Mild central vascular congestion with bilateral interstitial thickening. Findings are similar to prior exams. No convincing pulmonary edema/congestive  heart failure. No evidence of pneumonia. Electronically Signed   By: Amie Portland M.D.   On: 10/12/2015 15:42   Dg Wrist Complete Right  Result Date: 10/12/2015 CLINICAL DATA:  Right wrist deformity and bruising after fall today. EXAM: RIGHT WRIST - COMPLETE 3+ VIEW COMPARISON:  None. FINDINGS: Posterior displacement of comminuted distal radial fracture is noted. This appears to be closed and posttraumatic. Severe narrowing of radiocarpal joint is noted suggesting degenerative change. No soft tissue abnormality is noted. IMPRESSION: Moderately displaced and comminuted distal right radial fracture is noted. Electronically Signed   By: Lupita Raider, M.D.   On: 10/12/2015 15:41   Ct Head Wo Contrast  Result Date: 10/12/2015 CLINICAL DATA:  Status post fall yesterday getting into a wheelchair. Initial encounter. EXAM: CT HEAD WITHOUT CONTRAST CT CERVICAL SPINE WITHOUT CONTRAST TECHNIQUE: Multidetector CT imaging of the head and cervical spine was performed following the standard protocol without intravenous contrast. Multiplanar CT image reconstructions of the cervical spine were also generated. COMPARISON:  Head CT scan 03/02/2010. FINDINGS: CT HEAD FINDINGS There is cortical atrophy and chronic microvascular ischemic change. No evidence of acute intracranial abnormality occlusion hemorrhage, infarct, mass lesion, mass effect, midline shift or abnormal extra-axial fluid collection is identified. The calvarium is intact. Imaged paranasal sinuses and mastoid air cells are clear. CT CERVICAL SPINE FINDINGS No fracture is identified. There is severe multilevel spondylosis. The patient is status post posterior decompression C4-5 the C6-7. Lung apices demonstrate partial visualization of small appearing bilateral pleural effusions. Extensive atherosclerosis is noted. IMPRESSION: No acute abnormality head or cervical spine. Advanced multilevel spondylosis with postoperative change of decompression C4-C6  identified. Bilateral pleural effusions appear small but are incompletely seen. Extensive atherosclerosis. Electronically Signed   By: Drusilla Kanner M.D.   On: 10/12/2015 15:00   Ct Cervical Spine Wo Contrast  Result Date: 10/12/2015 CLINICAL DATA:  Status post fall yesterday getting into a wheelchair. Initial encounter. EXAM: CT HEAD WITHOUT CONTRAST CT CERVICAL SPINE WITHOUT CONTRAST TECHNIQUE: Multidetector CT imaging of the head and cervical spine was performed following the standard protocol without intravenous contrast. Multiplanar CT image reconstructions of the cervical spine were also generated. COMPARISON:  Head CT scan 03/02/2010. FINDINGS: CT HEAD FINDINGS There is cortical atrophy and chronic microvascular ischemic change. No evidence of acute intracranial abnormality occlusion hemorrhage,  infarct, mass lesion, mass effect, midline shift or abnormal extra-axial fluid collection is identified. The calvarium is intact. Imaged paranasal sinuses and mastoid air cells are clear. CT CERVICAL SPINE FINDINGS No fracture is identified. There is severe multilevel spondylosis. The patient is status post posterior decompression C4-5 the C6-7. Lung apices demonstrate partial visualization of small appearing bilateral pleural effusions. Extensive atherosclerosis is noted. IMPRESSION: No acute abnormality head or cervical spine. Advanced multilevel spondylosis with postoperative change of decompression C4-C6 identified. Bilateral pleural effusions appear small but are incompletely seen. Extensive atherosclerosis. Electronically Signed   By: Drusilla Kannerhomas  Dalessio M.D.   On: 10/12/2015 15:00   Dg Knee Right Port  Result Date: 10/12/2015 CLINICAL DATA:  Status post fall yesterday with a right femoral neck fracture. Right knee pain. Initial encounter. EXAM: PORTABLE RIGHT KNEE - 1-2 VIEW COMPARISON:  None. FINDINGS: No acute bony or joint abnormality is seen. Degenerative change is present about the knee. No joint  effusion. Atherosclerosis noted. IMPRESSION: No acute abnormality. Electronically Signed   By: Drusilla Kannerhomas  Dalessio M.D.   On: 10/12/2015 17:55   Dg Hip Unilat W Or Wo Pelvis 2-3 Views Right  Result Date: 10/12/2015 CLINICAL DATA:  Dementia, fall today EXAM: DG HIP (WITH OR WITHOUT PELVIS) 2-3V RIGHT COMPARISON:  None. FINDINGS: Four views of the right hip submitted. There is diffuse osteopenia. There is displaced fracture of the right femoral neck. Mild degenerative changes pubic symphysis. IMPRESSION: Displaced fracture of the right femoral neck. Electronically Signed   By: Natasha MeadLiviu  Pop M.D.   On: 10/12/2015 15:40        Scheduled Meds: . amLODipine  10 mg Oral q morning - 10a  . atorvastatin  10 mg Oral Daily  . buPROPion  75 mg Oral BID  . cloNIDine  0.1 mg Oral q1800  . cloNIDine  0.2 mg Oral Q breakfast  . docusate sodium  100 mg Oral BID  . dorzolamide-timolol  1 drop Both Eyes BID  . fenofibrate  160 mg Oral Daily  . furosemide  40 mg Oral BID  . gabapentin  100 mg Oral BID  . insulin glargine  20 Units Subcutaneous QHS  . isosorbide mononitrate  60 mg Oral q morning - 10a  . latanoprost  1 drop Both Eyes QHS  . lidocaine  2 patch Transdermal Q12H  . lisinopril  10 mg Oral Daily  . LORazepam  0.5 mg Oral Daily  . memantine  14 mg Oral q morning - 10a  . metoprolol tartrate  25 mg Oral BID  . oxyCODONE  30 mg Oral Q12H  . pantoprazole  40 mg Oral Daily  . polyethylene glycol powder  17 g Oral BID  . senna  2 tablet Oral BID   Continuous Infusions:    LOS: 1 day    Time spent: 30 minutes    Bennett ScrapeAlex Ukleja, MD [facility] Pager [phone]  If 7PM-7AM, please contact night-coverage [email or web address] Password [email or web address] 10/13/2015, 10:43 AM

## 2015-10-13 NOTE — Progress Notes (Signed)
Patient resting comfortable in bed. Pleasantly demented. Minimal pain with logroll of right hip.  R DR fx per hand team R femoral neck fx: patient's family desires nonoperative treatment due to nonambulatory status following

## 2015-10-14 ENCOUNTER — Encounter: Payer: Self-pay | Admitting: Adult Health

## 2015-10-14 DIAGNOSIS — S62101A Fracture of unspecified carpal bone, right wrist, initial encounter for closed fracture: Secondary | ICD-10-CM

## 2015-10-14 LAB — GLUCOSE, CAPILLARY
GLUCOSE-CAPILLARY: 146 mg/dL — AB (ref 65–99)
GLUCOSE-CAPILLARY: 209 mg/dL — AB (ref 65–99)
Glucose-Capillary: 225 mg/dL — ABNORMAL HIGH (ref 65–99)

## 2015-10-14 LAB — PATHOLOGIST SMEAR REVIEW

## 2015-10-14 LAB — MRSA PCR SCREENING: MRSA BY PCR: POSITIVE — AB

## 2015-10-14 MED ORDER — ACETAMINOPHEN 325 MG PO TABS: 650.0000 mg | ORAL_TABLET | Freq: Four times a day (QID) | ORAL | Status: AC | PRN

## 2015-10-14 MED ORDER — DOCUSATE SODIUM 100 MG PO CAPS: 100.0000 mg | ORAL_CAPSULE | Freq: Two times a day (BID) | ORAL | 0 refills | Status: AC

## 2015-10-14 NOTE — Clinical Social Work Note (Signed)
Nurse notified CSW pt medically cleared for discharge. CSW contacted SNF to inform pt discharging and will arrive by PT[facility]. CSW contacted pt wife to inform pt discharging today. Request made for nurse to call SNF and give report.

## 2015-10-14 NOTE — Progress Notes (Signed)
[facility] arrived to transport patient back to SNF.  This nurse attempted to call report twice, Amy answered and the call was left on hold for 10 minutes with no response. Patient transported to SNF. Wife was a bedside.

## 2015-10-14 NOTE — Clinical Social Work Placement (Signed)
   CLINICAL SOCIAL WORK PLACEMENT  NOTE  Date:  10/14/2015  Patient Details  Name: [name] MRN: [id number] Date of Birth: November 22, 1929  Clinical Social Work is seeking post-discharge placement for this patient at the Skilled  Nursing Facility level of care (*CSW will initial, date and re-position this form in  chart as items are completed):  Yes   Patient/family provided with [facility] Social Work Department's list of facilities offering this level of care within the geographic area requested by the patient (or if unable, by the patient's family).  Yes   Patient/family informed of their freedom to choose among providers that offer the needed level of care, that participate in Medicare, Medicaid or managed care program needed by the patient, have an available bed and are willing to accept the patient.  Yes   Patient/family informed of [facility], as well as of the fact that they are under no obligation to receive care at these facilities.  PASRR submitted to EDS on       PASRR number received on       Existing PASRR number confirmed on       FL2 transmitted to all facilities in geographic area requested by pt/family on 10/14/15     FL2 transmitted to all facilities within larger geographic area on       Patient informed that his/her managed care company has contracts with or will negotiate with certain facilities, including the following:            Patient/family informed of bed offers received.  Patient chooses bed at       Physician recommends and patient chooses bed at      Patient to be transferred to   on  .  Patient to be transferred to facility by       Patient family notified on   of transfer.  Name of family member notified:        PHYSICIAN Please prepare priority discharge summary, including medications, Please prepare prescriptions, Please sign FL2, Please sign DNR     Additional  Comment:    _______________________________________________ Terald Sleeperakiyah T Samona Chihuahua, LCSW 10/14/2015, 11:35 AM

## 2015-10-14 NOTE — Progress Notes (Signed)
PT Cancellation Note  Patient Details Name: [name] MRN: [id number] DOB: 14-Feb-1930   Cancelled Treatment:    Reason Eval/Treat Not Completed: PT screened, no needs identified, will sign off. Pt is dependent for all functional mobility and ADLs at baseline. Pt plans to return to SNF upon discharge. No acute physical therapy needs identified.   Alessandra BevelsJennifer M Zaria Taha 10/14/2015, 1:51 PM Deborah ChalkJennifer Rakesha Dalporto, PT, DPT [phone]

## 2015-10-14 NOTE — Discharge Summary (Signed)
Physician Discharge Summary  DORON SHAKE NWG:[id number] DOB: 1929-12-17 DOA: 10/12/2015  PCP: Kirt Boys, DO  Admit date: 10/12/2015 Discharge date: 10/14/2015  Admitted From: SNF Disposition:  [facility]  Recommendations for Outpatient Follow-up:  1. Follow up with Dr. Merlyn Lot this week 2. Monitor for signs of neurovascular compromise   Home Health:no Equipment/Devices: Oxygen 1L  Discharge Condition: stable CODE STATUS:DNR  Diet recommendation: Heart Healthy and carb Modified  Brief/Interim Summary: 82 year old male with PMH of admitted for displaced fracture of right femoral neck, displaced comminuted fracture of distal right radius. Ortho and hand surgery were consulted.  Patient was seen and evaluated by orthopaedic surgery and given risks of surgery and since patient is bedbound normally decision between surgeon and family to not proceed with surgical correction of femur fracture was made.  Patient was evaluated by hand surgeon and decision to follow up in the office to discuss with patient and his family options for treatment.   Discharge Diagnoses:  Principal Problem:   Hip fracture (HCC) Active Problems:   Right wrist fracture   Diabetic peripheral neuropathy associated with type 2 diabetes mellitus (HCC)   GERD   Chronic diastolic congestive heart failure (HCC)   Vascular dementia   Chronic pain   Glaucoma   Depression with anxiety   Dyslipidemia associated with type 2 diabetes mellitus (HCC)   Hypertensive heart disease with CHF (congestive heart failure) (HCC)   Chronic kidney disease, stage 4, severely decreased GFR (HCC)   Leukocytosis    Discharge Instructions  Discharge Instructions    Call MD for:  difficulty breathing, headache or visual disturbances    Complete by:  As directed   Call MD for:  persistant nausea and vomiting    Complete by:  As directed   Call MD for:  redness, tenderness, or signs of infection (pain, swelling, redness,  odor or green/yellow discharge around incision site)    Complete by:  As directed   Call MD for:  severe uncontrolled pain    Complete by:  As directed   Call MD for:  temperature >100.4    Complete by:  As directed   Diet - low sodium heart healthy    Complete by:  As directed   Increase activity slowly    Complete by:  As directed       Medication List    TAKE these medications   acetaminophen 325 MG tablet Commonly known as:  TYLENOL Take 2 tablets (650 mg total) by mouth every 6 (six) hours as needed for mild pain.   amLODipine 5 MG tablet Commonly known as:  NORVASC Take 10 mg by mouth every morning.   ascorbic acid 500 MG tablet Commonly known as:  VITAMIN C Take 500 mg by mouth 2 (two) times daily.   aspirin 81 MG tablet Take 81 mg by mouth every morning.   atorvastatin 10 MG tablet Commonly known as:  LIPITOR Take 1 tablet (10 mg total) by mouth daily.   buPROPion 75 MG tablet Commonly known as:  WELLBUTRIN Take 75 mg by mouth 2 (two) times daily.   cloNIDine 0.1 MG tablet Commonly known as:  CATAPRES Give 0.2 mg by mouth every morning (0800) and 0.1 mg by mouth every evening (1800)   coal tar 0.5 % shampoo Commonly known as:  NEUTROGENA T-GEL Apply topically. Apply topically every 96 hours for dandruff weekly as needed   diclofenac sodium 1 % Gel Commonly known as:  VOLTAREN  Apply 2 g topically 3 (three) times daily. To right wrist   docusate sodium 100 MG capsule Commonly known as:  COLACE Take 1 capsule (100 mg total) by mouth 2 (two) times daily.   dorzolamide-timolol 22.3-6.8 MG/ML ophthalmic solution Commonly known as:  COSOPT Place 1 drop into both eyes 2 (two) times daily.   fenofibrate 145 MG tablet Commonly known as:  TRICOR Take 145 mg by mouth every evening.   furosemide 40 MG tablet Commonly known as:  LASIX Take 40 mg by mouth 2 (two) times daily.   gabapentin 100 MG capsule Commonly known as:  NEURONTIN Take 100 mg by mouth 2  (two) times daily.   Insulin Glargine 100 UNIT/ML Solostar Pen Commonly known as:  LANTUS SOLOSTAR Inject 35 Units into the skin daily at 10 pm. What changed:  how much to take   isosorbide mononitrate 60 MG 24 hr tablet Commonly known as:  IMDUR Take 60 mg by mouth every morning.   KLOR-CON M20 20 MEQ tablet Generic drug:  potassium chloride SA Take 20 mEq by mouth 2 (two) times daily.   latanoprost 0.005 % ophthalmic solution Commonly known as:  XALATAN Place 1 drop into both eyes at bedtime.   lidocaine 5 % Commonly known as:  LIDODERM Place 1 patch onto the skin every 12 (twelve) hours. Remove & Discard patch within 12 hours to right neck  And to left hip   lisinopril 5 MG tablet Commonly known as:  PRINIVIL,ZESTRIL Take 10 mg by mouth daily.   LORazepam 0.5 MG tablet Commonly known as:  ATIVAN Take 0.5 mg by mouth daily. Prior to AM care   metoprolol 50 MG tablet Commonly known as:  LOPRESSOR Take 50 mg by mouth 2 (two) times daily.   NAMENDA XR 14 MG Cp24 24 hr capsule Generic drug:  memantine Take 1 tablet by mouth every morning.   NOVOLOG 100 UNIT/ML injection Generic drug:  insulin aspart Inject 5 Units into the skin 3 (three) times daily before meals. Inject as per sliding scale: if  0-150 =0 units; 151-450 = 8 units, subcutaneously before meals related to DM. Call MD for BS> 450   nystatin powder Commonly known as:  MYCOSTATIN/NYSTOP Apply topically 2 (two) times daily.   omeprazole 20 MG capsule Commonly known as:  PRILOSEC Take 40 mg by mouth every morning.   ondansetron 4 MG tablet Commonly known as:  ZOFRAN Take 4 mg by mouth every 8 (eight) hours as needed for nausea or vomiting.   oxyCODONE 30 MG 12 hr tablet Commonly known as:  OXYCONTIN Take one tablet by mouth every 12 hours for pain NO NOT CUT/CRUSH/CHEW What changed:  Another medication with the same name was removed. Continue taking this medication, and follow the directions you see  here.   polyethylene glycol powder powder Commonly known as:  GLYCOLAX/MIRALAX Take 17 g by mouth 2 (two) times daily.   potassium chloride 20 MEQ packet Commonly known as:  KLOR-CON Take 20 mEq by mouth 2 (two) times daily.   protein supplement Powd Take 1 scoop by mouth 2 (two) times daily.   NUTRITIONAL SUPPLEMENT PO Take by mouth. HSG Mech Soft Texture, for diet   senna 8.6 MG tablet Commonly known as:  SENOKOT Take 2 tablets by mouth 2 (two) times daily.   torsemide 20 MG tablet Commonly known as:  DEMADEX Take 20 mg by mouth 2 (two) times daily.      Follow-up Information    [facility]  R, MD. Call in 5 day(s).   Specialty:  Orthopedic Surgery Contact information: [address] [place] [id number] [phone]          No Known Allergies  Consultations:  Hand Surgery  Orthopaedic Surgery   Procedures/Studies: Dg Chest 1 View  Result Date: 10/12/2015 CLINICAL DATA:  Patient with dementia fell earlier today. SOB and congested sounding while laying down for other imaging. Bilateral lower leg/ankle swelling. Hx HTN, CHF, and Diabetes. EXAM: CHEST 1 VIEW COMPARISON:  02/27/2010 FINDINGS: Cardiac silhouette is normal in size. The aorta is mildly uncoiled with calcification throughout the thoracic aorta. No mediastinal or hilar masses or convincing adenopathy. Lungs show bilateral irregular interstitial thickening. There is central mild vascular congestion. No focal consolidation is seen to suggest pneumonia. No convincing pleural effusion. No pneumothorax. Bony thorax is demineralized but grossly intact. IMPRESSION: 1. Mild central vascular congestion with bilateral interstitial thickening. Findings are similar to prior exams. No convincing pulmonary edema/congestive heart failure. No evidence of pneumonia. Electronically Signed   By: Amie Portlandavid  Ormond M.D.   On: 10/12/2015 15:42   Dg Wrist Complete Right  Result Date: 10/12/2015 CLINICAL DATA:  Right wrist  deformity and bruising after fall today. EXAM: RIGHT WRIST - COMPLETE 3+ VIEW COMPARISON:  None. FINDINGS: Posterior displacement of comminuted distal radial fracture is noted. This appears to be closed and posttraumatic. Severe narrowing of radiocarpal joint is noted suggesting degenerative change. No soft tissue abnormality is noted. IMPRESSION: Moderately displaced and comminuted distal right radial fracture is noted. Electronically Signed   By: Lupita RaiderJames  Green Jr, M.D.   On: 10/12/2015 15:41   Ct Head Wo Contrast  Result Date: 10/12/2015 CLINICAL DATA:  Status post fall yesterday getting into a wheelchair. Initial encounter. EXAM: CT HEAD WITHOUT CONTRAST CT CERVICAL SPINE WITHOUT CONTRAST TECHNIQUE: Multidetector CT imaging of the head and cervical spine was performed following the standard protocol without intravenous contrast. Multiplanar CT image reconstructions of the cervical spine were also generated. COMPARISON:  Head CT scan 03/02/2010. FINDINGS: CT HEAD FINDINGS There is cortical atrophy and chronic microvascular ischemic change. No evidence of acute intracranial abnormality occlusion hemorrhage, infarct, mass lesion, mass effect, midline shift or abnormal extra-axial fluid collection is identified. The calvarium is intact. Imaged paranasal sinuses and mastoid air cells are clear. CT CERVICAL SPINE FINDINGS No fracture is identified. There is severe multilevel spondylosis. The patient is status post posterior decompression C4-5 the C6-7. Lung apices demonstrate partial visualization of small appearing bilateral pleural effusions. Extensive atherosclerosis is noted. IMPRESSION: No acute abnormality head or cervical spine. Advanced multilevel spondylosis with postoperative change of decompression C4-C6 identified. Bilateral pleural effusions appear small but are incompletely seen. Extensive atherosclerosis. Electronically Signed   By: Drusilla Kannerhomas  Dalessio M.D.   On: 10/12/2015 15:00   Ct Cervical Spine Wo  Contrast  Result Date: 10/12/2015 CLINICAL DATA:  Status post fall yesterday getting into a wheelchair. Initial encounter. EXAM: CT HEAD WITHOUT CONTRAST CT CERVICAL SPINE WITHOUT CONTRAST TECHNIQUE: Multidetector CT imaging of the head and cervical spine was performed following the standard protocol without intravenous contrast. Multiplanar CT image reconstructions of the cervical spine were also generated. COMPARISON:  Head CT scan 03/02/2010. FINDINGS: CT HEAD FINDINGS There is cortical atrophy and chronic microvascular ischemic change. No evidence of acute intracranial abnormality occlusion hemorrhage, infarct, mass lesion, mass effect, midline shift or abnormal extra-axial fluid collection is identified. The calvarium is intact. Imaged paranasal sinuses and mastoid air cells are clear. CT CERVICAL SPINE FINDINGS No fracture  is identified. There is severe multilevel spondylosis. The patient is status post posterior decompression C4-5 the C6-7. Lung apices demonstrate partial visualization of small appearing bilateral pleural effusions. Extensive atherosclerosis is noted. IMPRESSION: No acute abnormality head or cervical spine. Advanced multilevel spondylosis with postoperative change of decompression C4-C6 identified. Bilateral pleural effusions appear small but are incompletely seen. Extensive atherosclerosis. Electronically Signed   By: Drusilla Kanner M.D.   On: 10/12/2015 15:00   Dg Knee Right Port  Result Date: 10/12/2015 CLINICAL DATA:  Status post fall yesterday with a right femoral neck fracture. Right knee pain. Initial encounter. EXAM: PORTABLE RIGHT KNEE - 1-2 VIEW COMPARISON:  None. FINDINGS: No acute bony or joint abnormality is seen. Degenerative change is present about the knee. No joint effusion. Atherosclerosis noted. IMPRESSION: No acute abnormality. Electronically Signed   By: Drusilla Kanner M.D.   On: 10/12/2015 17:55   Dg Hip Unilat W Or Wo Pelvis 2-3 Views Right  Result Date:  10/12/2015 CLINICAL DATA:  Dementia, fall today EXAM: DG HIP (WITH OR WITHOUT PELVIS) 2-3V RIGHT COMPARISON:  None. FINDINGS: Four views of the right hip submitted. There is diffuse osteopenia. There is displaced fracture of the right femoral neck. Mild degenerative changes pubic symphysis. IMPRESSION: Displaced fracture of the right femoral neck. Electronically Signed   By: Natasha Mead M.D.   On: 10/12/2015 15:40      Subjective: Patient in good spirits this morning.  He is awake and says he ate only a small bit of his breakfast.  He then says he is joking and he ate the whole breakfast.  He denies any pain except for occasional pain in his right hip.  Denies any difficulty breathing, any chest pain, any problems urinating.  His nasal canula was over his mouth on first assessment.  He says he does not feel he needs oxygen and the tubing is a nuisance. He is surprised he is going back to his SNF today.  He voices no questions.   Discharge Exam: Vitals:   10/14/15 0422 10/14/15 1234  BP: (!) 150/40 129/65  Pulse: 73 70  Resp: 18 19  Temp: 99.7 F (37.6 C) 98.8 F (37.1 C)   Vitals:   10/13/15 1825 10/13/15 2102 10/14/15 0422 10/14/15 1234  BP: (!) 143/72 (!) 147/62 (!) 150/40 129/65  Pulse: 74 77 73 70  Resp:  18 18 19   Temp:  98.6 F (37 C) 99.7 F (37.6 C) 98.8 F (37.1 C)  TempSrc:  Oral Oral   SpO2: 98% 97% 90% 93%  Weight:      Height:        General: Pt is alert, awake, not in acute distress Cardiovascular: RRR, S1/S2 +, no rubs, no gallops Respiratory: CTA bilaterally, no wheezing, no rhonchi Abdominal: Soft, NT, ND, bowel sounds + Extremities: no edema, no cyanosis    The results of significant diagnostics from this hospitalization (including imaging, microbiology, ancillary and laboratory) are listed below for reference.     Microbiology: Recent Results (from the past 240 hour(s))  MRSA PCR Screening     Status: Abnormal   Collection Time: 10/13/15 10:58 PM   Result Value Ref Range Status   MRSA by PCR POSITIVE (A) NEGATIVE Final    Comment:        The GeneXpert MRSA Assay (FDA approved for NASAL specimens only), is one component of a comprehensive MRSA colonization surveillance program. It is not intended to diagnose MRSA infection nor to guide or monitor treatment  for MRSA infections. RESULT CALLED TO, READ BACK BY AND VERIFIED WITH: A STEPHENS RN 0158 10/14/15 A BROWNING      Labs: BNP (last 3 results) No results for input(s): BNP in the last 8760 hours. Basic Metabolic Panel:  Recent Labs Lab 10/12/15 1556 10/12/15 1958 10/13/15 0504  NA 134*  --  139  K 4.2  --  4.1  CL 102  --  103  CO2 25  --  23  GLUCOSE 286*  --  198*  BUN 35*  --  34*  CREATININE 2.50*  --  2.08*  CALCIUM 9.4  --  9.4  MG  --  1.9  --    Liver Function Tests: No results for input(s): AST, ALT, ALKPHOS, BILITOT, PROT, ALBUMIN in the last 168 hours. No results for input(s): LIPASE, AMYLASE in the last 168 hours. No results for input(s): AMMONIA in the last 168 hours. CBC:  Recent Labs Lab 10/12/15 1556 10/13/15 0504  WBC 27.0* 22.6*  NEUTROABS 18.8*  --   HGB 11.8* 11.6*  HCT 34.9* 34.7*  MCV 92.6 93.0  PLT 155 130*   Cardiac Enzymes: No results for input(s): CKTOTAL, CKMB, CKMBINDEX, TROPONINI in the last 168 hours. BNP: Invalid input(s): POCBNP CBG:  Recent Labs Lab 10/13/15 1220 10/13/15 1639 10/13/15 2053 10/14/15 0658 10/14/15 1248  GLUCAP 205* 236* 205* 146* 209*   D-Dimer No results for input(s): DDIMER in the last 72 hours. Hgb A1c No results for input(s): HGBA1C in the last 72 hours. Lipid Profile No results for input(s): CHOL, HDL, LDLCALC, TRIG, CHOLHDL, LDLDIRECT in the last 72 hours. Thyroid function studies No results for input(s): TSH, T4TOTAL, T3FREE, THYROIDAB in the last 72 hours.  Invalid input(s): FREET3 Anemia work up No results for input(s): VITAMINB12, FOLATE, FERRITIN, TIBC, IRON, RETICCTPCT  in the last 72 hours. Urinalysis    Component Value Date/Time   COLORURINE AMBER BIOCHEMICALS [place] BE AFFECTED BY COLOR (A) 03/14/2009 2303   APPEARANCEUR TURBID (A) 03/14/2009 2303   LABSPEC 1.027 03/14/2009 2303   PHURINE 5.5 03/14/2009 2303   GLUCOSEU 250 (A) 03/14/2009 2303   HGBUR TRACE (A) 03/14/2009 2303   BILIRUBINUR SMALL (A) 03/14/2009 2303   KETONESUR 15 (A) 03/14/2009 2303   PROTEINUR >300 (A) 03/14/2009 2303   UROBILINOGEN 0.2 03/14/2009 2303   NITRITE NEGATIVE 03/14/2009 2303   LEUKOCYTESUR NEGATIVE 03/14/2009 2303   Sepsis Labs Invalid input(s): PROCALCITONIN,  WBC,  LACTICIDVEN Microbiology Recent Results (from the past 240 hour(s))  MRSA PCR Screening     Status: Abnormal   Collection Time: 10/13/15 10:58 PM  Result Value Ref Range Status   MRSA by PCR POSITIVE (A) NEGATIVE Final    Comment:        The GeneXpert MRSA Assay (FDA approved for NASAL specimens only), is one component of a comprehensive MRSA colonization surveillance program. It is not intended to diagnose MRSA infection nor to guide or monitor treatment for MRSA infections. RESULT CALLED TO, READ BACK BY AND VERIFIED WITH: A STEPHENS RN 0158 10/14/15 A BROWNING      Time coordinating discharge: Less than 30 minutes  SIGNED:   Bennett Scrape, MD  [facility] 10/14/2015, 12:58 PM Pager [phone] If 7PM-7AM, please contact night-coverage [email or web address] Password [email or web address]

## 2015-10-14 NOTE — Progress Notes (Signed)
OT Cancellation Note  Patient Details Name: [name] MRN: [id number] DOB: May 13, 1929   Cancelled Treatment:    Reason Eval/Treat Not Completed: OT screened, no needs identified, will sign off. Pt is dependent in ADL at baseline and plan is to return to SNF upon discharge.   Evern BioMayberry, [name] 10/14/2015, 11:58 AM  [phone]

## 2015-10-14 NOTE — Clinical Social Work Note (Signed)
Clinical Social Work Assessment  Patient Details  Name: [name] MRN: [id number] Date of Birth: 28-Feb-1930  Date of referral:  10/14/15               Reason for consult:  Discharge Planning                Permission sought to share information with:  Case Manager, Facility Sport and exercise psychologist, Family Supports Permission granted to share information::  Yes, Verbal Permission Granted  Name::        Agency::   (SNF)  Relationship::     Contact Information:     Housing/Transportation Living arrangements for the past 2 months:  [place] of Information:  Medical Team, Spouse Patient Interpreter Needed:  None Criminal Activity/Legal Involvement Pertinent to Current Situation/Hospitalization:  No - Comment as needed Significant Relationships:  Spouse Lives with:  Facility Resident Do you feel safe going back to the place where you live?  No Need for family participation in patient care:  Yes (Comment)  Care giving concerns: Pt confused and not able to participate in assessment. CSW spoke with pt wife via phone. Pt wife agreeable to SNF.    Social Worker assessment / plan: Holiday representative met with pt to discuss CSW role with discharge planning. Pt confused. CSW Spoke with pt wife via phone and wife informed of PT recommendation. Pt wife stated she is agreeable to pt returning back to [facility].   CSW will follow up with pt family to assist with discharge when necessary.   Employment status:  Retired Forensic scientist:  Medicare PT Recommendations:  [place] / Referral to community resources:     Patient/Family's Response to care: Pt lying in bed when CSW entered the room. Pt very pleasant and confused. Pt wife agreeable with pt returning to SNF, and wife appears happy with care pt is receiving at [facility].   Patient/Family's Understanding of and Emotional Response to Diagnosis, Current Treatment, and Prognosis:  Pt wife appears to have a good understanding of reason for pt admission into the hospital and pt care plan.   Emotional Assessment Appearance:  Appears stated age, Well-Groomed Attitude/Demeanor/Rapport:   (Pleasant) Affect (typically observed):  Pleasant, Unable to Assess Orientation:  Oriented to Self Alcohol / Substance use:  Not Applicable Psych involvement (Current and /or in the community):  No (Comment)  Discharge Needs  Concerns to be addressed:  Discharge Planning Concerns Readmission within the last 30 days:  No Current discharge risk:  None Barriers to Discharge:  Continued Medical Work up   [employer], LCSW 10/14/2015, 11:14 AM

## 2015-10-14 NOTE — [place] FL2 (Signed)
[place] MEDICAID FL2 LEVEL OF CARE SCREENING TOOL     IDENTIFICATION  Patient Name: [name] Birthdate: 03-Mar-1930 Sex: male Admission Date (Current Location): 10/12/2015  [facility] and [place] Number:  Producer, television/film/video and Address:  [facility], [address] [place], [place] [address]      Provider Number: [id number]  Attending Physician Name and Address:  Maye Hides, MD  Relative Name and Phone Number:       Current Level of Care: Hospital Recommended Level of Care: Skilled Nursing Facility Prior Approval Number:    Date Approved/Denied:   PASRR Number:    Discharge Plan: SNF    Current Diagnoses: Patient Active Problem List   Diagnosis Date Noted  . Hip fracture (HCC) 10/12/2015  . Chronic kidney disease, stage 4, severely decreased GFR (HCC) 10/12/2015  . Right wrist fracture 10/12/2015  . Leukocytosis 10/12/2015  . Hypertensive heart disease with CHF (congestive heart failure) (HCC) 05/29/2015  . Stage II pressure ulcer of right ankle 03/19/2015  . Dyslipidemia associated with type 2 diabetes mellitus (HCC) 01/31/2015  . Osteoarthritis 08/15/2014  . Depression with anxiety 11/27/2013  . Constipation due to pain medication 07/18/2013  . Glaucoma 12/07/2012  . Type 2 diabetes mellitus with neurological manifestations, controlled (HCC) 11/13/2012  . Vascular dementia 10/29/2012  . Chronic pain 10/29/2012  . Chronic diastolic congestive heart failure (HCC) 06/13/2012  . Anemia 01/15/2010  . Diabetic peripheral neuropathy associated with type 2 diabetes mellitus (HCC) 01/15/2010  . GERD 01/15/2010    Orientation RESPIRATION BLADDER Height & Weight     Self  O2 (1l/min) Continent Weight: 240 lb (108.9 kg) Height:  6\' 3"  (190.5 cm)  BEHAVIORAL SYMPTOMS/MOOD NEUROLOGICAL BOWEL NUTRITION STATUS   (None)  (None) Incontinent Diet (Carb Modified)  AMBULATORY STATUS COMMUNICATION OF NEEDS Skin   Extensive Assist Verbally  Surgical wounds                       Personal Care Assistance Level of Assistance  Bathing, Feeding, Dressing Bathing Assistance: Limited assistance Feeding assistance: Independent Dressing Assistance: Limited assistance     Functional Limitations Info  Sight, Hearing, Speech Sight Info: Adequate Hearing Info: Adequate Speech Info: Adequate    SPECIAL CARE FACTORS FREQUENCY  PT (By licensed PT)     PT Frequency:  (5x/week)              Contractures Contractures Info: Not present    Additional Factors Info  Code Status, Allergies Code Status Info:  (DNR) Allergies Info:  (NKDA)           Current Medications (10/14/2015):  This is the current hospital active medication list Current Facility-Administered Medications  Medication Dose Route Frequency Provider Last Rate Last Dose  . acetaminophen (TYLENOL) tablet 650 mg  650 mg Oral Q6H PRN Maye Hides, MD   650 mg at 10/13/15 [phone]  . amLODipine (NORVASC) tablet 10 mg  10 mg Oral q morning - 10a Maye Hides, MD   10 mg at 10/14/15 1032  . atorvastatin (LIPITOR) tablet 10 mg  10 mg Oral Daily Maye Hides, MD   10 mg at 10/14/15 1032  . [facility]) tablet 75 mg  75 mg Oral BID Maye Hides, MD   75 mg at 10/14/15 1032  . cloNIDine (CATAPRES) tablet 0.1 mg  0.1 mg Oral q1800 Maye Hides, MD   0.1 mg at 10/13/15 1825  .  cloNIDine (CATAPRES) tablet 0.2 mg  0.2 mg Oral Q breakfast Maye HidesAlexandria M Ukleja, MD   0.2 mg at 10/14/15 0746  . docusate sodium (COLACE) capsule 100 mg  100 mg Oral BID Maye HidesAlexandria M Ukleja, MD   100 mg at 10/14/15 1032  . dorzolamide-timolol (COSOPT) 22.3-6.8 MG/ML ophthalmic solution 1 drop  1 drop Both Eyes BID Maye HidesAlexandria M Ukleja, MD   1 drop at 10/14/15 1033  . fenofibrate tablet 160 mg  160 mg Oral Daily Maye HidesAlexandria M Ukleja, MD   160 mg at 10/14/15 1032  . furosemide (LASIX) tablet 40 mg  40 mg Oral BID Maye HidesAlexandria M Ukleja, MD   40 mg at 10/14/15 0746   . gabapentin (NEURONTIN) capsule 100 mg  100 mg Oral BID Maye HidesAlexandria M Ukleja, MD   100 mg at 10/14/15 1032  . HYDROcodone-acetaminophen (NORCO/VICODIN) 5-325 MG per tablet 1 tablet  1 tablet Oral Q8H PRN Maye HidesAlexandria M Ukleja, MD      . insulin aspart (novoLOG) injection 0-5 Units  0-5 Units Subcutaneous QHS Maye HidesAlexandria M Ukleja, MD   2 Units at 10/13/15 2249  . insulin aspart (novoLOG) injection 0-9 Units  0-9 Units Subcutaneous TID WC Maye HidesAlexandria M Ukleja, MD   1 Units at 10/14/15 [phone]  . insulin aspart (novoLOG) injection 4 Units  4 Units Subcutaneous TID WC Maye HidesAlexandria M Ukleja, MD   4 Units at 10/14/15 0747  . insulin glargine (LANTUS) injection 30 Units  30 Units Subcutaneous QHS Maye HidesAlexandria M Ukleja, MD   30 Units at 10/13/15 2042  . isosorbide mononitrate (IMDUR) 24 hr tablet 60 mg  60 mg Oral q morning - 10a Maye HidesAlexandria M Ukleja, MD   60 mg at 10/14/15 1031  . latanoprost (XALATAN) 0.005 % ophthalmic solution 1 drop  1 drop Both Eyes QHS Maye HidesAlexandria M Ukleja, MD   1 drop at 10/12/15 2129  . lidocaine (LIDODERM) 5 % 2 patch  2 patch Transdermal Q12H Maye HidesAlexandria M Ukleja, MD   2 patch at 10/14/15 1033  . lisinopril (PRINIVIL,ZESTRIL) tablet 10 mg  10 mg Oral Daily Maye HidesAlexandria M Ukleja, MD   10 mg at 10/14/15 1031  . LORazepam (ATIVAN) tablet 0.5 mg  0.5 mg Oral Daily Maye HidesAlexandria M Ukleja, MD   0.5 mg at 10/14/15 0655  . magnesium citrate solution 1 Bottle  1 Bottle Oral Once PRN Maye HidesAlexandria M Ukleja, MD      . memantine (NAMENDA XR) 24 hr capsule 14 mg  14 mg Oral q morning - 10a Maye HidesAlexandria M Ukleja, MD   14 mg at 10/14/15 1034  . metoprolol tartrate (LOPRESSOR) tablet 25 mg  25 mg Oral BID Maye HidesAlexandria M Ukleja, MD   25 mg at 10/14/15 1032  . ondansetron (ZOFRAN) injection 4 mg  4 mg Intravenous Q6H PRN Maye HidesAlexandria M Ukleja, MD      . ondansetron [facility](ZOFRAN) tablet 4 mg  4 mg Oral Q8H PRN Maye HidesAlexandria M Ukleja, MD      . oxyCODONE (Oxy IR/ROXICODONE) immediate release tablet 5 mg  5 mg Oral Q8H PRN Maye HidesAlexandria  M Ukleja, MD   5 mg at 10/13/15 [phone]  . oxyCODONE (OXYCONTIN) 12 hr tablet 30 mg  30 mg Oral Q12H Maye HidesAlexandria M Ukleja, MD   30 mg at 10/14/15 0655  . pantoprazole (PROTONIX) EC tablet 40 mg  40 mg Oral Daily Maye HidesAlexandria M Ukleja, MD   40 mg at 10/14/15 1032  . polyethylene glycol (MIRALAX / GLYCOLAX) packet 17 g  17 g Oral Daily PRN [place]  Wyvonnia Lora, MD      . polyethylene glycol powder (GLYCOLAX/MIRALAX) container 17 g  17 g Oral BID Maye Hides, MD   17 g at 10/14/15 1000  . senna (SENOKOT) tablet 17.2 mg  2 tablet Oral BID Maye Hides, MD   17.2 mg at 10/14/15 1032  . sorbitol 70 % solution 30 mL  30 mL Oral Daily PRN Maye Hides, MD         Discharge Medications: Please see discharge summary for a list of discharge medications.  Relevant Imaging Results:  Relevant Lab Results:   Additional Information ss#: [id number]  Terald Sleeper, LCSW

## 2015-10-16 ENCOUNTER — Encounter: Payer: Self-pay | Admitting: Adult Health

## 2015-10-16 ENCOUNTER — Other Ambulatory Visit: Payer: Self-pay | Admitting: *Deleted

## 2015-10-16 ENCOUNTER — Non-Acute Institutional Stay (SKILLED_NURSING_FACILITY): Payer: Medicare Other | Admitting: Adult Health

## 2015-10-16 DIAGNOSIS — G8929 Other chronic pain: Secondary | ICD-10-CM

## 2015-10-16 DIAGNOSIS — I5032 Chronic diastolic (congestive) heart failure: Secondary | ICD-10-CM

## 2015-10-16 DIAGNOSIS — W19XXXA Unspecified fall, initial encounter: Secondary | ICD-10-CM

## 2015-10-16 DIAGNOSIS — E1149 Type 2 diabetes mellitus with other diabetic neurological complication: Secondary | ICD-10-CM | POA: Diagnosis not present

## 2015-10-16 DIAGNOSIS — S72001D Fracture of unspecified part of neck of right femur, subsequent encounter for closed fracture with routine healing: Secondary | ICD-10-CM

## 2015-10-16 DIAGNOSIS — F418 Other specified anxiety disorders: Secondary | ICD-10-CM

## 2015-10-16 DIAGNOSIS — I110 Hypertensive heart disease with heart failure: Secondary | ICD-10-CM | POA: Diagnosis not present

## 2015-10-16 DIAGNOSIS — E1142 Type 2 diabetes mellitus with diabetic polyneuropathy: Secondary | ICD-10-CM | POA: Diagnosis not present

## 2015-10-16 DIAGNOSIS — E785 Hyperlipidemia, unspecified: Secondary | ICD-10-CM

## 2015-10-16 DIAGNOSIS — E1169 Type 2 diabetes mellitus with other specified complication: Secondary | ICD-10-CM

## 2015-10-16 DIAGNOSIS — S62101D Fracture of unspecified carpal bone, right wrist, subsequent encounter for fracture with routine healing: Secondary | ICD-10-CM

## 2015-10-16 MED ORDER — LORAZEPAM 0.5 MG PO TABS: ORAL_TABLET | ORAL | 5 refills | Status: DC

## 2015-10-16 NOTE — Telephone Encounter (Signed)
[facility] #: [phone] Fax#: [phone]  

## 2015-10-16 NOTE — Progress Notes (Signed)
[name], male   DOB: 05-17-1929, 82 y.o.   MRN: [id number]   Location:   [facility] Room Number: 115A Place of Service:  SNF (31)   CODE STATUS: DNR  No Known Allergies  Chief Complaint  Patient presents with  . Hospitalization Follow-up    Hospital follow up    HPI:  He is a long term resident of this facility who was hospitalized following a fall in which he fractured his right radius and right femoral neck. The decision was made not to use surgical interventions as he is nonambulatory. He tells me that he is feeling better and has no complaints. There are no nursing concerns at this time.    Past Medical History:  Diagnosis Date  . 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 Anxiety   . At high risk for falls   . Chronic diastolic congestive heart failure (HCC) 06/13/2012   Echo in 2011 with grade 1 diastolic dysfunction and normal systolic function with EF 60%.     . DDD (degenerative disc disease), lumbosacral   . Depression   . Depression with anxiety 11/27/2013  . Diabetic peripheral neuropathy (HCC)   . Dyslipidemia   . GERD (gastroesophageal reflux disease)   . History of colon polyps   . History of diabetic ulcer of foot   . History of MRSA infection    Hx chronic diabetic ulcer's secondary MRSA  . Hypertension   . Insulin dependent type 2 diabetes mellitus (HCC)   . Peripheral vascular disease (HCC)   . Portal hypertension (HCC) 01/15/2010   Qualifier: Diagnosis of  By: Everardo All MD, Cleophas Dunker   . Renal insufficiency   . Ulcer of right ankle (HCC)   . UNSPECIFIED PERIPHERAL VASCULAR DISEASE 01/15/2010   Qualifier: Diagnosis of  By: Everardo All MD, Cleophas Dunker   . Wheelchair dependent     Past Surgical History:  Procedure Laterality Date  . COLONOSCOPY WITH ESOPHAGOGASTRODUODENOSCOPY (EGD)  09-21-2002  . INCISION AND DRAINAGE OF WOUND Right 01/10/2014   Procedure: IRRIGATION AND DEBRIDEMENT RIGHT ANKLE WOUND WITH PLACEMENT OF ACELL;   Surgeon: Wayland Denis, DO;  Location: [facility];  Service: Plastics;  Laterality: Right;  . INGUINAL HERNIA REPAIR Left 11-30-2001  . LAPAROSCOPIC CHOLECYSTECTOMY    . TOE AMPUTATION    . TRANSTHORACIC ECHOCARDIOGRAM  12-20-2009   severe LVH/  ef 60%/  grade I diastolic dysfunction/  AV sclerosis without stenosis/  mild LAE and RAE/      Social History   Social History  . Marital status: Married    Spouse name: N/A  . Number of children: N/A  . Years of education: N/A   Occupational History  .  Retired    Retired   Social History Main Topics  . Smoking status: Former Smoker    Types: Cigarettes  . Smokeless tobacco: Never Used  . Alcohol use No     Comment: hx alcohol abuse  in remission  . Drug use: No  . Sexual activity: No   Other Topics Concern  . Not on file   Social History Narrative   Lives at [facility]   Family History  Problem Relation Age of Onset  . Diabetes Neg Hx     No DM in immediate family      VITAL SIGNS BP (!) 150/64   Pulse 70   Temp 98.4 F (36.9 C) (Oral)   Resp 18   Ht 5'  10" (1.778 m)   Wt 235 lb (106.6 kg)   SpO2 98%   BMI 33.72 kg/m   Patient's Medications  New Prescriptions   No medications on file  Previous Medications   ACETAMINOPHEN (TYLENOL) 325 MG TABLET    Take 2 tablets (650 mg total) by mouth every 6 (six) hours as needed for mild pain.   AMLODIPINE (NORVASC) 5 MG TABLET    Take 10 mg by mouth every morning.    ASCORBIC ACID (VITAMIN C) 500 MG TABLET    Take 500 mg by mouth 2 (two) times daily.   ASPIRIN 81 MG TABLET    Take 81 mg by mouth every morning.    ATORVASTATIN (LIPITOR) 10 MG TABLET    Take 1 tablet (10 mg total) by mouth daily.   BUPROPION (WELLBUTRIN) 75 MG TABLET    Take 75 mg by mouth 2 (two) times daily.    CLONIDINE (CATAPRES) 0.1 MG TABLET    Give 0.2 mg by mouth every morning (0800) and 0.1 mg by mouth every evening (1800)   COAL TAR (NEUTROGENA T-GEL) 0.5 % SHAMPOO     Apply topically. Apply topically every 96 hours for dandruff weekly as needed   DICLOFENAC SODIUM (VOLTAREN) 1 % GEL    Apply 2 g topically 3 (three) times daily. To right wrist   DOCUSATE SODIUM (COLACE) 100 MG CAPSULE    Take 1 capsule (100 mg total) by mouth 2 (two) times daily.   DORZOLAMIDE-TIMOLOL (COSOPT) 22.3-6.8 MG/ML OPHTHALMIC SOLUTION    Place 1 drop into both eyes 2 (two) times daily.   FENOFIBRATE (TRICOR) 145 MG TABLET    Take 145 mg by mouth every evening.    GABAPENTIN (NEURONTIN) 100 MG CAPSULE    Take 100 mg by mouth 2 (two) times daily.   INSULIN ASPART (NOVOLOG) 100 UNIT/ML INJECTION    Inject 5 Units into the skin 3 (three) times daily before meals. Inject as per sliding scale: if  0-150 =0 units; 151-450 = 8 units, subcutaneously before meals related to DM. Call MD for BS> 450   INSULIN GLARGINE (LANTUS SOLOSTAR) 100 UNIT/ML SOLOSTAR PEN    Inject 35 Units into the skin daily at 10 pm.   ISOSORBIDE MONONITRATE (IMDUR) 60 MG 24 HR TABLET    Take 60 mg by mouth every morning.    LATANOPROST (XALATAN) 0.005 % OPHTHALMIC SOLUTION    Place 1 drop into both eyes at bedtime.     LIDOCAINE (LIDODERM) 5 %    Place 1 patch onto the skin every 12 (twelve) hours. Remove & Discard patch within 12 hours to right neck  And to left hip   LISINOPRIL (PRINIVIL,ZESTRIL) 5 MG TABLET    Take 10 mg by mouth daily.    LORAZEPAM (ATIVAN) 0.5 MG TABLET    Take one tablet by mouth every morning   MEMANTINE HCL ER (NAMENDA XR) 14 MG CP24    Take 1 tablet by mouth every morning.   METOPROLOL (LOPRESSOR) 50 MG TABLET    Take 50 mg by mouth 2 (two) times daily.   NUTRITIONAL SUPPLEMENTS (NUTRITIONAL SUPPLEMENT PO)    Take by mouth. HSG Mech Soft Texture, for diet   NYSTATIN (MYCOSTATIN/NYSTOP) POWDER    Apply topically 2 (two) times daily.   OMEPRAZOLE (PRILOSEC) 20 MG CAPSULE    Take 40 mg by mouth every morning.    ONDANSETRON (ZOFRAN) 4 MG TABLET    Take 4 mg by mouth every 8 (eight)  hours as needed  for nausea or vomiting.   OXYCODONE (OXYCONTIN) 30 MG 12 HR TABLET    Take one tablet by mouth every 12 hours for pain NO NOT CUT/CRUSH/CHEW   POLYETHYLENE GLYCOL POWDER (GLYCOLAX/MIRALAX) POWDER    Take 17 g by mouth 2 (two) times daily.    POTASSIUM CHLORIDE (KLOR-CON) 20 MEQ PACKET    Take 20 mEq by mouth 2 (two) times daily.   PROTEIN SUPPLEMENT (PROMOD) POWD    Take 1 scoop by mouth 2 (two) times daily.   SENNA (SENOKOT) 8.6 MG TABLET    Take 2 tablets by mouth 2 (two) times daily.    TORSEMIDE (DEMADEX) 20 MG TABLET    Take 20 mg by mouth 2 (two) times daily.  Modified Medications   No medications on file  Discontinued Medications   FUROSEMIDE (LASIX) 40 MG TABLET    Take 40 mg by mouth 2 (two) times daily.    POTASSIUM CHLORIDE SA (KLOR-CON M20) 20 MEQ TABLET    Take 20 mEq by mouth 2 (two) times daily.     SIGNIFICANT DIAGNOSTIC EXAMS  03-14-14: right ankle tib/fib mri: 1. 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 2. T2 hyperintense probable draining abscess deep to ulceration. 3. Ankle effusion. This is probably reactive. Septic arthritis is less likely. 4. Infectious tenosynovitis of the peroneal tendons. 5. No abscess in the proximal RIGHT leg.  06-08-14: right hand x-ray; mild osteoarthritis  06-08-14: right wrist x-ray; osteoarthritis right wrist  06-09-14: pelvic and left hip x-ray: mild degenerative disease left hip no significant change since 09-28-13.   10-02-15: chest x-ray: no acute cardiopulmonary process    LABS REVIEWED:   12-09-14: glucose 159; bun 19; creat 1.44; k+ 3.4; na++139; hgb a1c 6.2 12-26-14: k+ 3.6  04-26-15: wbc 9.4; hgb 10.8; hct 32.4; mcv 92.0; plt 108; glucose 188; bun 29; creat 1.93; k+ 3.8; na++142; liver normal albumin 3.6; chol 98; ldl 39; trig 179; hdl 23; hgb a1c 6.8 urine micro-albumin: 55.8 05-08-15: hgb a1c 6.9  06-14-15; sed rate 20; CRP <0.5 08-30-15: hgb a1c 6.2  10-02-15: wbc 11.4; hgb 10.9;  hct 33.1; mcv 93.2 ;plt 170 10-12-15: wbc 27.0; hgb 11.8; hct 34.9; mcv 92.6; plt 155; glucose 286; bun 35; create 2.50; k+ 4.2; na++ 134; mag 1.9      Review of Systems Constitutional: Negative for appetite change and fatigue.  HENT: Negative for congestion.   Respiratory: Negative for cough, chest tightness and shortness of breath.   Cardiovascular: Negative for chest pain, palpitations and leg swelling.  Gastrointestinal: Negative for nausea, abdominal pain, diarrhea and constipation.  Musculoskeletal: Negative for myalgias and arthralgias.  Skin: no complaints     Neurological: Negative for dizziness.  Psychiatric/Behavioral: The patient is not nervous/anxious.       Physical Exam Constitutional: No distress.  Overweight   Eyes: Conjunctivae are normal.  Neck: Neck supple. No JVD present. No thyromegaly present.  Cardiovascular: Normal rate, regular rhythm and intact distal pulses.   Respiratory: Effort normal and breath sounds normal. No respiratory distress. He has no wheezes.  GI: Soft. Bowel sounds are normal. He exhibits no distension. There is no tenderness.  Musculoskeletal: He exhibits no edema.  Able to move all extremities   Lymphadenopathy:    He has no cervical adenopathy.  Neurological: He is alert.  Skin: Skin is warm and dry. He is not diaphoretic.    Psychiatric: He has a normal mood and affect.  ASSESSMENT/ PLAN:  1. Hypertension: will continue lopressor 50 mg twice daily; norvasc 10 mg daily asa 81 mg daily; will continue  lisinopril  10 mg daily   Will continue clonidine 0.1 mg in the AM and 0.2 in the PM will monitor   2. Chronic diastolic heart failure: will continue demadex 20 mg twice daily lopressor 50 mg twice daily imdur 60 mg daily  2011: EF 60%   3. Constipation: will continue miralax twice daily; senna 2 tabs twice daily colace twice daily   4. Hypokalemia will continue  k+ 20 meq twice daily k+ is 4.2  5. GERD: will continue  prilosec 40 mg daily   6. Dyslipidemia: will continue tricor 145 mg daily; will continue  lipitor 10 mg daily; ldl is 39; trig is 179  7. Diabetes:  hgb a1c is 6.2 his cbg's are not controlled; will continue  lantus  35 units nightly and  novolog 5 units with meals with and additional 3 units for cbg >=150  8. Depression with anxiety will continue wellbutrin  75 mg twice daily; ativan 0.5 mg daily prior to AM care   for anxiety and will monitor  9. Vascular dementia: is without change; will continue namenda xr 14 mg daily and will monitor   10. Chronic pain: his pain is presently being managed will continue oxycontin 30 mg twice daily; neurontin 100 mg twice daily; lidoderm patch to his right neck daily and left hip; voltaren gel 2 gm to right wrist three times daily    12. Glaucoma; will continue latanoprost to both eyes nightly and cosopt to both eyes twice daily .   13. Anemia: his hgb is 11.8; will monitor   14. Diabetic peripheral neuropathy: will continue neurontin 100 mg twice daily   14. Right radial fracture and right femoral neck fracture: will follow up with orthopedics as directed; at this time; he is not having pain; will not make changes in his pain regimen and will monitor his status.    Time spent with patient  50  minutes >50% time spent counseling; reviewing medical record; tests; labs; and developing future plan of care      MD is aware of resident's narcotic use and is in agreement with current plan of care. We will attempt to wean resident as apropriate   [name] Innocenteborah Cinsere Mizrahi NP [facility]  Contact [phone] Monday through Friday 8am- 5pm  After hours call [phone]

## 2015-10-24 ENCOUNTER — Non-Acute Institutional Stay (SKILLED_NURSING_FACILITY): Payer: Medicare Other | Admitting: Internal Medicine

## 2015-10-24 ENCOUNTER — Encounter: Payer: Self-pay | Admitting: Internal Medicine

## 2015-10-24 DIAGNOSIS — S72001D Fracture of unspecified part of neck of right femur, subsequent encounter for closed fracture with routine healing: Secondary | ICD-10-CM | POA: Diagnosis not present

## 2015-10-24 DIAGNOSIS — M150 Primary generalized (osteo)arthritis: Secondary | ICD-10-CM | POA: Diagnosis not present

## 2015-10-24 DIAGNOSIS — G8929 Other chronic pain: Secondary | ICD-10-CM | POA: Diagnosis not present

## 2015-10-24 DIAGNOSIS — F0150 Vascular dementia without behavioral disturbance: Secondary | ICD-10-CM

## 2015-10-24 DIAGNOSIS — F418 Other specified anxiety disorders: Secondary | ICD-10-CM

## 2015-10-24 DIAGNOSIS — E1169 Type 2 diabetes mellitus with other specified complication: Secondary | ICD-10-CM

## 2015-10-24 DIAGNOSIS — I10 Essential (primary) hypertension: Secondary | ICD-10-CM

## 2015-10-24 DIAGNOSIS — S62101D Fracture of unspecified carpal bone, right wrist, subsequent encounter for fracture with routine healing: Secondary | ICD-10-CM

## 2015-10-24 DIAGNOSIS — M159 Polyosteoarthritis, unspecified: Secondary | ICD-10-CM

## 2015-10-24 DIAGNOSIS — I5032 Chronic diastolic (congestive) heart failure: Secondary | ICD-10-CM

## 2015-10-24 DIAGNOSIS — E785 Hyperlipidemia, unspecified: Secondary | ICD-10-CM | POA: Diagnosis not present

## 2015-10-24 DIAGNOSIS — E1149 Type 2 diabetes mellitus with other diabetic neurological complication: Secondary | ICD-10-CM | POA: Diagnosis not present

## 2015-10-24 NOTE — Progress Notes (Signed)
[name], male   DOB: 14-Sep-1929, 82 y.o.   MRN: [id number]    DATE: 10/24/15  Location:    [facility] Room Number: 115 A Place of Service: SNF (31)   Extended Emergency Contact Information Primary Emergency Contact: [name],[name] C Address: [place], [place] [address] [place] Phone: [phone] Relation: Spouse  Advanced Directive information Does patient have an advance directive?: Yes, Type of Advance Directive: Out of facility DNR (pink MOST or yellow form), Does patient want to make changes to advanced directive?: No - Patient declined  Chief Complaint  Patient presents with  . Readmit To SNF    HPI:  82 yo male long term resident seen today for readmission into SNF following hospital stay for right femoral neck displaced fx and displaced comminuted fx of distal right radius s/p fall at SNF. He was seen by Ortho and family decided to proceed with nonsurgical tx of hip and hand as he is nonambulatory at baseline. Cr 2.5--->2.08; Hgb 11.6; Plts 130K;  He presents to SNF for short term rehab followed by continued long term care.  He c/o right wrist and hip pain. No falls since readmission. He is tolerating tx. Denies numbness or tingling. No nursing issues. Appetite ok. Sleeps well. He is a poor historian due to dementia. Hx obtained from chart.  Hypertension - BP stable on lopressor 50 mg twice daily; norvasc 10 mg daily; lisinopril  10 mg daily; clonidine 0.1 mg in the AM and 0.2 in the PM. Takes ASA 8m daily for anticoagulation   Chronic diastolic heart failure - stable on demadex 20 mg twice daily; lopressor 50 mg twice daily; imdur 60 mg daily. EF 60% in 2011  Constipation - stable on miralax twice daily; senna 2 tabs twice daily; colace twice daily   Hypokalemia - stable on KCL 20 meq twice daily. K+ 4.2  GERD - stable on prilosec 40 mg daily   Hyperlipidemia - stable on tricor  145 mg daily; lipitor 10 mg daily. LDL 39; TG 179  DM - CBGs fluctuate. Today CBG 189. A1c  6.2%. Takes  lantus  35 units nightly and novolog 5 units with meals with and additional 3 units for CBG >=150. Takes neurontin 1066mBID for neuropathy  Depression with anxiety - mood stable on wellbutrin  75 mg twice daily; ativan 0.5 mg daily prior to morning care  Vascular dementia - stable on namenda xr 14 mg daily   Chronic pain due to DDD/OA/neuropathy- stable on oxycontin 30 mg twice daily; neurontin 100 mg twice daily; lidoderm patch to his right neck daily and left hip; voltaren gel 2 gm to right wrist three times daily    Glaucoma - stable on latanoprost to both eyes nightly and cosopt to both eyes twice daily .   Anemia - stable. Hgb 11.8   CKD - stage IV. Cr 2.08  Past Medical History:  Diagnosis Date  . 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 Anxiety   . At high risk for falls   . Chronic diastolic congestive heart failure ([facility]/02/2013   Echo in 2011 with grade 1 diastolic dysfunction and normal systolic function with EF 60%.     . DDD (degenerative disc disease), lumbosacral   . Depression   . Depression with anxiety 11/27/2013  . Diabetic peripheral neuropathy ([facility]  . Dyslipidemia   .  GERD (gastroesophageal reflux disease)   . History of colon polyps   . History of diabetic ulcer of foot   . History of MRSA infection    Hx chronic diabetic ulcer's secondary MRSA  . Hypertension   . Insulin dependent type 2 diabetes mellitus ([place])   . Peripheral vascular disease ([place])   . Portal hypertension ([place]) 01/15/2010   Qualifier: Diagnosis of  By: Loanne Drilling MD, Jacelyn Pi   . Renal insufficiency   . Ulcer of right ankle ([place])   . UNSPECIFIED PERIPHERAL VASCULAR DISEASE 01/15/2010   Qualifier: Diagnosis of  By: Loanne Drilling MD, Jacelyn Pi   . Wheelchair dependent     Past Surgical History:  Procedure Laterality Date  . COLONOSCOPY WITH ESOPHAGOGASTRODUODENOSCOPY (EGD)  09-21-2002  .  INCISION AND DRAINAGE OF WOUND Right 01/10/2014   Procedure: IRRIGATION AND DEBRIDEMENT RIGHT ANKLE WOUND WITH PLACEMENT OF ACELL;  Surgeon: Theodoro Kos, DO;  Location: [facility];  Service: Plastics;  Laterality: Right;  . INGUINAL HERNIA REPAIR Left 11-30-2001  . LAPAROSCOPIC CHOLECYSTECTOMY    . TOE AMPUTATION    . TRANSTHORACIC ECHOCARDIOGRAM  12-20-2009   severe LVH/  ef 62%/  grade I diastolic dysfunction/  AV sclerosis without stenosis/  mild LAE and RAE/      Patient Care Team: Gildardo Cranker, DO as PCP - General (Internal Medicine) Gerlene Fee, NP as Nurse Practitioner (Nurse Practitioner) Tarri Abernethy ([place])  Social History   Social History  . Marital status: Married    Spouse name: N/A  . Number of children: N/A  . Years of education: N/A   Occupational History  .  Retired    Retired   Social History Main Topics  . Smoking status: Former Smoker    Types: Cigarettes  . Smokeless tobacco: Never Used  . Alcohol use No     Comment: hx alcohol abuse  in remission  . Drug use: No  . Sexual activity: No   Other Topics Concern  . Not on file   Social History Narrative   Lives at [facility]     reports that he has quit smoking. His smoking use included Cigarettes. He has never used smokeless tobacco. He reports that he does not drink alcohol or use drugs.  Family History  Problem Relation Age of Onset  . Diabetes Neg Hx     No DM in immediate family   Family Status  Relation Status  . Mother Deceased  . Father Deceased  . Sister Alive  . Neg Hx     Immunization History  Administered Date(s) Administered  . Influenza Split 12/06/2011  . Influenza Whole 12/24/2012  . Influenza-Unspecified 12/15/2013, 11/28/2014  . PPD Test 03/29/2009  . Pneumococcal Conjugate-13 08/21/2005    No Known Allergies  Medications: Patient's Medications  New Prescriptions   No medications on file  Previous  Medications   ACETAMINOPHEN (TYLENOL) 325 MG TABLET    Take 2 tablets (650 mg total) by mouth every 6 (six) hours as needed for mild pain.   AMLODIPINE (NORVASC) 5 MG TABLET    Take 10 mg by mouth every morning.    ASCORBIC ACID (VITAMIN C) 500 MG TABLET    Take 500 mg by mouth 2 (two) times daily.   ASPIRIN 81 MG TABLET    Take 81 mg by mouth every morning.    ATORVASTATIN (LIPITOR) 10 MG TABLET    Take 1 tablet (10 mg total) by mouth  daily.   BUPROPION (WELLBUTRIN) 75 MG TABLET    Take 75 mg by mouth 2 (two) times daily.    CLONIDINE (CATAPRES) 0.1 MG TABLET    Give 0.2 mg by mouth every morning (0800) and 0.1 mg by mouth every evening (1800)   COAL TAR (NEUTROGENA T-GEL) 0.5 % SHAMPOO    Apply topically. Apply topically every 96 hours for dandruff weekly as needed   DICLOFENAC SODIUM (VOLTAREN) 1 % GEL    Apply 2 g topically 3 (three) times daily. To right wrist   DOCUSATE SODIUM (COLACE) 100 MG CAPSULE    Take 1 capsule (100 mg total) by mouth 2 (two) times daily.   DORZOLAMIDE-TIMOLOL (COSOPT) 22.3-6.8 MG/ML OPHTHALMIC SOLUTION    Place 1 drop into both eyes 2 (two) times daily.   FENOFIBRATE (TRICOR) 145 MG TABLET    Take 145 mg by mouth every evening.    GABAPENTIN (NEURONTIN) 100 MG CAPSULE    Take 100 mg by mouth 2 (two) times daily.   INSULIN ASPART (NOVOLOG) 100 UNIT/ML INJECTION    Inject 5 Units into the skin 3 (three) times daily before meals. Inject as per sliding scale: if  0-150 =0 units; 151-450 = 8 units, subcutaneously before meals related to DM. Call MD for BS> 450   INSULIN GLARGINE (LANTUS) 100 UNIT/ML INJECTION    Inject 32 Units into the skin at bedtime.   ISOSORBIDE MONONITRATE (IMDUR) 60 MG 24 HR TABLET    Take 60 mg by mouth every morning.    LATANOPROST (XALATAN) 0.005 % OPHTHALMIC SOLUTION    Place 1 drop into both eyes at bedtime.     LIDOCAINE (LIDODERM) 5 %    Place 1 patch onto the skin every 12 (twelve) hours. Remove & Discard patch within 12 hours to right neck   And to left hip   LISINOPRIL (PRINIVIL,ZESTRIL) 10 MG TABLET    Take 10 mg by mouth daily.    LORAZEPAM (ATIVAN) 0.5 MG TABLET    Take one tablet by mouth every morning   MEMANTINE HCL ER (NAMENDA XR) 14 MG CP24    Take 1 tablet by mouth every morning.   METOPROLOL (LOPRESSOR) 50 MG TABLET    Take 50 mg by mouth 2 (two) times daily.   NUTRITIONAL SUPPLEMENTS (NUTRITIONAL SUPPLEMENT PO)    Take by mouth. HSG Mech Soft Texture, for diet   NYSTATIN (MYCOSTATIN/NYSTOP) POWDER    Apply topically 2 (two) times daily.   OMEPRAZOLE (PRILOSEC) 20 MG CAPSULE    Take 40 mg by mouth every morning.    ONDANSETRON (ZOFRAN) 4 MG TABLET    Take 4 mg by mouth every 8 (eight) hours as needed for nausea or vomiting.   OXYCODONE (OXYCONTIN) 30 MG 12 HR TABLET    Take one tablet by mouth every 12 hours for pain NO NOT CUT/CRUSH/CHEW   OXYCODONE HCL, ABUSE DETER, (OXAYDO) 5 MG TABA    Take 1 tablet by mouth every 8 (eight) hours as needed.   POLYETHYLENE GLYCOL POWDER (GLYCOLAX/MIRALAX) POWDER    Take 17 g by mouth 2 (two) times daily.    POTASSIUM CHLORIDE (KLOR-CON) 20 MEQ PACKET    Take 20 mEq by mouth 2 (two) times daily.   PROTEIN SUPPLEMENT (PROMOD) POWD    Take 1 scoop by mouth 2 (two) times daily.   SENNA (SENOKOT) 8.6 MG TABLET    Take 2 tablets by mouth 2 (two) times daily.    TORSEMIDE (DEMADEX) 20 MG  TABLET    Take 20 mg by mouth 2 (two) times daily.  Modified Medications   No medications on file  Discontinued Medications   INSULIN GLARGINE (LANTUS SOLOSTAR) 100 UNIT/ML SOLOSTAR PEN    Inject 35 Units into the skin daily at 10 pm.    Review of Systems  Unable to perform ROS: Dementia    Vitals:   10/24/15 1109  BP: (!) 148/78  Pulse: 73  Resp: 20  Temp: 98.1 F (36.7 C)  TempSrc: Oral  SpO2: 96%  Weight: 230 lb 3.2 oz (104.4 kg)  Height: 5' 10"  (1.778 m)   Body mass index is 33.03 kg/m.  Physical Exam  Constitutional: He appears well-developed.  Frail appearing in NAD, sitting in w/c   HENT:  Mouth/Throat: Oropharynx is clear and moist.  Eyes: Pupils are equal, round, and reactive to light. No scleral icterus.  Neck: Neck supple. Carotid bruit is not present. No thyromegaly present.  Cardiovascular: Normal rate, regular rhythm and intact distal pulses.  Exam reveals no gallop and no friction rub.   Murmur (1/6 SEM) heard. +1 pitting BLE edema. No calf TTP  Pulmonary/Chest: Effort normal and breath sounds normal. He has no wheezes. He has no rales. He exhibits no tenderness.  Abdominal: Soft. Bowel sounds are normal. He exhibits no distension, no abdominal bruit, no pulsatile midline mass and no mass. There is tenderness (LUQ). There is no rebound and no guarding.  Musculoskeletal: He exhibits edema and tenderness.  Right hard wrist cast/splint present with FROM fingers; RLE internally rotated  Lymphadenopathy:    He has no cervical adenopathy.  Neurological: He is alert.  Skin: Skin is warm and dry. Rash (R>L anterior leg; abdominal wall excoriations; no vesicular formation) noted.  B/l foot wound dsg c/d/i  Psychiatric: He has a normal mood and affect. His behavior is normal.     Labs reviewed: Nursing Home on 10/24/2015  Component Date Value Ref Range Status  . Hemoglobin 10/02/2015 10.9* 13.5 - 17.5 g/dL Final  . HCT 10/02/2015 33* 41 - 53 % Final  . Platelets 10/02/2015 170  150 - 399 K/L Final  . WBC 10/02/2015 11.4  10^3/mL Final  Admission on 10/12/2015, Discharged on 10/14/2015  Component Date Value Ref Range Status  . Sodium 10/12/2015 134* 135 - 145 mmol/L Final  . Potassium 10/12/2015 4.2  3.5 - 5.1 mmol/L Final  . Chloride 10/12/2015 102  101 - 111 mmol/L Final  . CO2 10/12/2015 25  22 - 32 mmol/L Final  . Glucose, Bld 10/12/2015 286* 65 - 99 mg/dL Final  . BUN 10/12/2015 35* 6 - 20 mg/dL Final  . Creatinine, Ser 10/12/2015 2.50* 0.61 - 1.24 mg/dL Final  . Calcium 10/12/2015 9.4  8.9 - 10.3 mg/dL Final  . GFR calc non Af Amer 10/12/2015 22* >60  mL/min Final  . GFR calc Af Amer 10/12/2015 26* >60 mL/min Final   Comment: (NOTE) The eGFR has been calculated using the CKD EPI equation. This calculation has not been validated in all clinical situations. eGFR's persistently <60 mL/min signify possible Chronic Kidney Disease.   . Anion gap 10/12/2015 7  5 - 15 Final  . WBC 10/12/2015 27.0* 4.0 - 10.5 K/uL Final  . RBC 10/12/2015 3.77* 4.22 - 5.81 MIL/uL Final  . Hemoglobin 10/12/2015 11.8* 13.0 - 17.0 g/dL Final  . HCT 10/12/2015 34.9* 39.0 - 52.0 % Final  . MCV 10/12/2015 92.6  78.0 - 100.0 fL Final  . MCH 10/12/2015 31.3  26.0 - 34.0 pg Final  . MCHC 10/12/2015 33.8  30.0 - 36.0 g/dL Final  . RDW 10/12/2015 15.3  11.5 - 15.5 % Final  . Platelets 10/12/2015 155  150 - 400 K/uL Final  . Neutrophils Relative % 10/12/2015 70  % Final  . Lymphocytes Relative 10/12/2015 24  % Final  . Monocytes Relative 10/12/2015 5  % Final  . Eosinophils Relative 10/12/2015 1  % Final  . Basophils Relative 10/12/2015 0  % Final  . Neutro Abs 10/12/2015 18.8* 1.7 - 7.7 K/uL Final  . Lymphs Abs 10/12/2015 6.5* 0.7 - 4.0 K/uL Final  . Monocytes Absolute 10/12/2015 1.4* 0.1 - 1.0 K/uL Final  . Eosinophils Absolute 10/12/2015 0.3  0.0 - 0.7 K/uL Final  . Basophils Absolute 10/12/2015 0.0  0.0 - 0.1 K/uL Final  . Smear Review 10/12/2015 MORPHOLOGY UNREMARKABLE   Final  . WBC 10/13/2015 22.6* 4.0 - 10.5 K/uL Final  . RBC 10/13/2015 3.73* 4.22 - 5.81 MIL/uL Final  . Hemoglobin 10/13/2015 11.6* 13.0 - 17.0 g/dL Final  . HCT 10/13/2015 34.7* 39.0 - 52.0 % Final  . MCV 10/13/2015 93.0  78.0 - 100.0 fL Final  . MCH 10/13/2015 31.1  26.0 - 34.0 pg Final  . MCHC 10/13/2015 33.4  30.0 - 36.0 g/dL Final  . RDW 10/13/2015 15.3  11.5 - 15.5 % Final  . Platelets 10/13/2015 130* 150 - 400 K/uL Final  . Sodium 10/13/2015 139  135 - 145 mmol/L Final  . Potassium 10/13/2015 4.1  3.5 - 5.1 mmol/L Final  . Chloride 10/13/2015 103  101 - 111 mmol/L Final  . CO2  10/13/2015 23  22 - 32 mmol/L Final  . Glucose, Bld 10/13/2015 198* 65 - 99 mg/dL Final  . BUN 10/13/2015 34* 6 - 20 mg/dL Final  . Creatinine, Ser 10/13/2015 2.08* 0.61 - 1.24 mg/dL Final  . Calcium 10/13/2015 9.4  8.9 - 10.3 mg/dL Final  . GFR calc non Af Amer 10/13/2015 28* >60 mL/min Final  . GFR calc Af Amer 10/13/2015 32* >60 mL/min Final   Comment: (NOTE) The eGFR has been calculated using the CKD EPI equation. This calculation has not been validated in all clinical situations. eGFR's persistently <60 mL/min signify possible Chronic Kidney Disease.   . Anion gap 10/13/2015 13  5 - 15 Final  . ABO/RH(D) 10/12/2015 O POS   Final  . Antibody Screen 10/12/2015 NEG   Final  . Sample Expiration 10/12/2015 10/15/2015   Final  . Magnesium 10/12/2015 1.9  1.7 - 2.4 mg/dL Final  . ABO/RH(D) 10/12/2015 O POS   Final  . Glucose-Capillary 10/12/2015 213* 65 - 99 mg/dL Final  . Glucose-Capillary 10/13/2015 164* 65 - 99 mg/dL Final  . Glucose-Capillary 10/13/2015 205* 65 - 99 mg/dL Final  . Comment 1 10/13/2015 Repeat Test   Final  . Comment 2 10/13/2015 Document in Chart   Final  . Glucose-Capillary 10/13/2015 236* 65 - 99 mg/dL Final  . Comment 1 10/13/2015 Repeat Test   Final  . Comment 2 10/13/2015 Document in Chart   Final  . Glucose-Capillary 10/13/2015 205* 65 - 99 mg/dL Final  . MRSA by PCR 10/14/2015 POSITIVE* NEGATIVE Final   Comment:        The GeneXpert MRSA Assay (FDA approved for NASAL specimens only), is one component of a comprehensive MRSA colonization surveillance program. It is not intended to diagnose MRSA infection nor to guide or monitor treatment for MRSA infections. RESULT CALLED TO, READ  BACK BY AND VERIFIED WITH: A STEPHENS RN [id number] 10/14/15 A BROWNING   . Glucose-Capillary 10/14/2015 146* 65 - 99 mg/dL Final  . Glucose-Capillary 10/14/2015 209* 65 - 99 mg/dL Final  . Path Review 10/14/2015 Leukocytosis. Lymphocytosis.   Final   Comment: Reviewed by Casimer Lanius, MD 10/13/15.   Marland Kitchen Glucose-Capillary 10/14/2015 225* 65 - 99 mg/dL Final  Nursing Home on 09/28/2015  Component Date Value Ref Range Status  . Hemoglobin A1C 08/30/2015 6.2   Final  Nursing Home on 07/27/2015  Component Date Value Ref Range Status  . Sed Rate 07/27/2015 20   Final  . CRP 07/27/2015 0.5  mg/dL Final    Dg Chest 1 View  Result Date: 10/12/2015 CLINICAL DATA:  Patient with dementia fell earlier today. SOB and congested sounding while laying down for other imaging. Bilateral lower leg/ankle swelling. Hx HTN, CHF, and Diabetes. EXAM: CHEST 1 VIEW COMPARISON:  02/27/2010 FINDINGS: Cardiac silhouette is normal in size. The aorta is mildly uncoiled with calcification throughout the thoracic aorta. No mediastinal or hilar masses or convincing adenopathy. Lungs show bilateral irregular interstitial thickening. There is central mild vascular congestion. No focal consolidation is seen to suggest pneumonia. No convincing pleural effusion. No pneumothorax. Bony thorax is demineralized but grossly intact. IMPRESSION: 1. Mild central vascular congestion with bilateral interstitial thickening. Findings are similar to prior exams. No convincing pulmonary edema/congestive heart failure. No evidence of pneumonia. Electronically Signed   By: Lajean Manes M.D.   On: 10/12/2015 15:42   Dg Wrist Complete Right  Result Date: 10/12/2015 CLINICAL DATA:  Right wrist deformity and bruising after fall today. EXAM: RIGHT WRIST - COMPLETE 3+ VIEW COMPARISON:  None. FINDINGS: Posterior displacement of comminuted distal radial fracture is noted. This appears to be closed and posttraumatic. Severe narrowing of radiocarpal joint is noted suggesting degenerative change. No soft tissue abnormality is noted. IMPRESSION: Moderately displaced and comminuted distal right radial fracture is noted. Electronically Signed   By: Marijo Conception, M.D.   On: 10/12/2015 15:41   Ct Head Wo Contrast  Result Date:  10/12/2015 CLINICAL DATA:  Status post fall yesterday getting into a wheelchair. Initial encounter. EXAM: CT HEAD WITHOUT CONTRAST CT CERVICAL SPINE WITHOUT CONTRAST TECHNIQUE: Multidetector CT imaging of the head and cervical spine was performed following the standard protocol without intravenous contrast. Multiplanar CT image reconstructions of the cervical spine were also generated. COMPARISON:  Head CT scan 03/02/2010. FINDINGS: CT HEAD FINDINGS There is cortical atrophy and chronic microvascular ischemic change. No evidence of acute intracranial abnormality occlusion hemorrhage, infarct, mass lesion, mass effect, midline shift or abnormal extra-axial fluid collection is identified. The calvarium is intact. Imaged paranasal sinuses and mastoid air cells are clear. CT CERVICAL SPINE FINDINGS No fracture is identified. There is severe multilevel spondylosis. The patient is status post posterior decompression C4-5 the C6-7. Lung apices demonstrate partial visualization of small appearing bilateral pleural effusions. Extensive atherosclerosis is noted. IMPRESSION: No acute abnormality head or cervical spine. Advanced multilevel spondylosis with postoperative change of decompression C4-C6 identified. Bilateral pleural effusions appear small but are incompletely seen. Extensive atherosclerosis. Electronically Signed   By: Inge Rise M.D.   On: 10/12/2015 15:00   Ct Cervical Spine Wo Contrast  Result Date: 10/12/2015 CLINICAL DATA:  Status post fall yesterday getting into a wheelchair. Initial encounter. EXAM: CT HEAD WITHOUT CONTRAST CT CERVICAL SPINE WITHOUT CONTRAST TECHNIQUE: Multidetector CT imaging of the head and cervical spine was performed following the standard protocol without intravenous  contrast. Multiplanar CT image reconstructions of the cervical spine were also generated. COMPARISON:  Head CT scan 03/02/2010. FINDINGS: CT HEAD FINDINGS There is cortical atrophy and chronic microvascular  ischemic change. No evidence of acute intracranial abnormality occlusion hemorrhage, infarct, mass lesion, mass effect, midline shift or abnormal extra-axial fluid collection is identified. The calvarium is intact. Imaged paranasal sinuses and mastoid air cells are clear. CT CERVICAL SPINE FINDINGS No fracture is identified. There is severe multilevel spondylosis. The patient is status post posterior decompression C4-5 the C6-7. Lung apices demonstrate partial visualization of small appearing bilateral pleural effusions. Extensive atherosclerosis is noted. IMPRESSION: No acute abnormality head or cervical spine. Advanced multilevel spondylosis with postoperative change of decompression C4-C6 identified. Bilateral pleural effusions appear small but are incompletely seen. Extensive atherosclerosis. Electronically Signed   By: Inge Rise M.D.   On: 10/12/2015 15:00   Dg Knee Right Port  Result Date: 10/12/2015 CLINICAL DATA:  Status post fall yesterday with a right femoral neck fracture. Right knee pain. Initial encounter. EXAM: PORTABLE RIGHT KNEE - 1-2 VIEW COMPARISON:  None. FINDINGS: No acute bony or joint abnormality is seen. Degenerative change is present about the knee. No joint effusion. Atherosclerosis noted. IMPRESSION: No acute abnormality. Electronically Signed   By: Inge Rise M.D.   On: 10/12/2015 17:55   Dg Hip Unilat W Or Wo Pelvis 2-3 Views Right  Result Date: 10/12/2015 CLINICAL DATA:  Dementia, fall today EXAM: DG HIP (WITH OR WITHOUT PELVIS) 2-3V RIGHT COMPARISON:  None. FINDINGS: Four views of the right hip submitted. There is diffuse osteopenia. There is displaced fracture of the right femoral neck. Mild degenerative changes pubic symphysis. IMPRESSION: Displaced fracture of the right femoral neck. Electronically Signed   By: Lahoma Crocker M.D.   On: 10/12/2015 15:40     Assessment/Plan   ICD-9-CM ICD-10-CM   1. Closed right hip fracture, with routine healing, subsequent  encounter V54.13 S72.001D   2. Right wrist fracture, with routine healing, subsequent encounter V54.19 S62.101D   3. Vascular dementia, without behavioral disturbance 290.40 F01.50   4. Chronic pain 338.29 G89.29   5. Chronic diastolic congestive heart failure (HCC) 428.32 I50.32    428.0    6. Type 2 diabetes mellitus with neurological manifestations, controlled ([place]) 250.60 E11.49   7. Depression with anxiety 300.4 F41.8   8. Dyslipidemia associated with type 2 diabetes mellitus (HCC) 250.80 E11.69    272.4 E78.5   9. Primary osteoarthritis involving multiple joints 715.09 M15.0   10. Essential hypertension 401.9 I10    Cont current meds as ordered  Pain control as indicated  F/u with Ortho as scheduled  PT/OT/ST as ordered  Nutritional supplements as indicated  GOAL: short term rehab then continue long term care Communicated with pt and nursing.  Will follow  Senai Kingsley S. Perlie Gold  [facility] [address] [place], [place] [address] [phone] Cell (Monday-Friday 8 AM - 5 PM) [phone] After 5 PM and follow prompts

## 2015-10-27 LAB — BASIC METABOLIC PANEL
BUN: 36 mg/dL — AB (ref 4–21)
Creatinine: 1.8 mg/dL — AB (ref 0.6–1.3)
Glucose: 195 mg/dL
Potassium: 4.2 mmol/L (ref 3.4–5.3)
Sodium: 142 mmol/L (ref 137–147)

## 2015-11-27 ENCOUNTER — Non-Acute Institutional Stay (SKILLED_NURSING_FACILITY): Payer: Medicare Other | Admitting: Adult Health

## 2015-11-27 ENCOUNTER — Encounter: Payer: Self-pay | Admitting: Adult Health

## 2015-11-27 DIAGNOSIS — F418 Other specified anxiety disorders: Secondary | ICD-10-CM

## 2015-11-27 DIAGNOSIS — K219 Gastro-esophageal reflux disease without esophagitis: Secondary | ICD-10-CM

## 2015-11-27 DIAGNOSIS — E785 Hyperlipidemia, unspecified: Secondary | ICD-10-CM | POA: Diagnosis not present

## 2015-11-27 DIAGNOSIS — N184 Chronic kidney disease, stage 4 (severe): Secondary | ICD-10-CM

## 2015-11-27 DIAGNOSIS — E1169 Type 2 diabetes mellitus with other specified complication: Secondary | ICD-10-CM | POA: Diagnosis not present

## 2015-11-27 DIAGNOSIS — S62101D Fracture of unspecified carpal bone, right wrist, subsequent encounter for fracture with routine healing: Secondary | ICD-10-CM

## 2015-11-27 DIAGNOSIS — G8929 Other chronic pain: Secondary | ICD-10-CM | POA: Diagnosis not present

## 2015-11-27 DIAGNOSIS — I5032 Chronic diastolic (congestive) heart failure: Secondary | ICD-10-CM | POA: Diagnosis not present

## 2015-11-27 DIAGNOSIS — S72001D Fracture of unspecified part of neck of right femur, subsequent encounter for closed fracture with routine healing: Secondary | ICD-10-CM | POA: Diagnosis not present

## 2015-11-27 DIAGNOSIS — E1149 Type 2 diabetes mellitus with other diabetic neurological complication: Secondary | ICD-10-CM

## 2015-11-27 DIAGNOSIS — I110 Hypertensive heart disease with heart failure: Secondary | ICD-10-CM

## 2015-11-27 NOTE — Progress Notes (Signed)
[name], male   DOB: [birth year], 82 y.o.   MRN: [id number]   Location:   [facility] Room Number: 115-A Place of Service:  SNF (31)   CODE STATUS: DNR  No Known Allergies  Chief Complaint  Patient presents with  . Medical Management of Chronic Issues    Follow up    HPI:  [name] is a long term resident of this facility being seen for the management of his chronic illnesses. Overall his status is stable. [name] tells me that [name] is feeling good and has no complaints. There are no nursing concerns at this time.   Past Medical History:  Diagnosis Date  . 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 Anxiety   . At high risk for falls   . Chronic diastolic congestive heart failure (HCC) 06/13/2012   Echo in 2011 with grade 1 diastolic dysfunction and normal systolic function with EF 60%.     . DDD (degenerative disc disease), lumbosacral   . Depression   . Depression with anxiety 11/27/2013  . Diabetic peripheral neuropathy (HCC)   . Dyslipidemia   . GERD (gastroesophageal reflux disease)   . History of colon polyps   . History of diabetic ulcer of foot   . History of MRSA infection    Hx chronic diabetic ulcer's secondary MRSA  . Hypertension   . Insulin dependent type 2 diabetes mellitus (HCC)   . Peripheral vascular disease (HCC)   . Portal hypertension (HCC) 01/15/2010   Qualifier: Diagnosis of  By: Everardo All MD, Cleophas Dunker   . Renal insufficiency   . Ulcer of right ankle (HCC)   . UNSPECIFIED PERIPHERAL VASCULAR DISEASE 01/15/2010   Qualifier: Diagnosis of  By: Everardo All MD, Cleophas Dunker   . Wheelchair dependent     Past Surgical History:  Procedure Laterality Date  . COLONOSCOPY WITH ESOPHAGOGASTRODUODENOSCOPY (EGD)  09-21-2002  . INCISION AND DRAINAGE OF WOUND Right 01/10/2014   Procedure: IRRIGATION AND DEBRIDEMENT RIGHT ANKLE WOUND WITH PLACEMENT OF ACELL;  Surgeon: Wayland Denis, DO;  Location: [facility];  Service: Plastics;  Laterality:  Right;  . INGUINAL HERNIA REPAIR Left 11-30-2001  . LAPAROSCOPIC CHOLECYSTECTOMY    . TOE AMPUTATION    . TRANSTHORACIC ECHOCARDIOGRAM  12-20-2009   severe LVH/  ef 60%/  grade I diastolic dysfunction/  AV sclerosis without stenosis/  mild LAE and RAE/      Social History   Social History  . Marital status: Married    Spouse name: N/A  . Number of children: N/A  . Years of education: N/A   Occupational History  .  Retired    Retired   Social History Main Topics  . Smoking status: Former Smoker    Types: Cigarettes  . Smokeless tobacco: Never Used  . Alcohol use No     Comment: hx alcohol abuse  in remission  . Drug use: No  . Sexual activity: No   Other Topics Concern  . Not on file   Social History Narrative   Lives at [facility]   Family History  Problem Relation Age of Onset  . Diabetes Neg Hx     No DM in immediate family      VITAL SIGNS BP 138/86   Pulse 70   Temp 97.1 F (36.2 C) (Oral)   Resp 18   Ht 5\' 10"  (1.778 m)   Wt 235 lb 6 oz (106.8 kg)   SpO2 98%  BMI 33.77 kg/m   Patient's Medications  New Prescriptions   No medications on file  Previous Medications   ACETAMINOPHEN (TYLENOL) 325 MG TABLET    Take 2 tablets (650 mg total) by mouth every 6 (six) hours as needed for mild pain.   AMLODIPINE (NORVASC) 5 MG TABLET    Take 10 mg by mouth every morning.    ASCORBIC ACID (VITAMIN C) 500 MG TABLET    Take 500 mg by mouth 2 (two) times daily.   ASPIRIN 81 MG TABLET    Take 81 mg by mouth every morning.    ATORVASTATIN (LIPITOR) 10 MG TABLET    Take 1 tablet (10 mg total) by mouth daily.   BUPROPION (WELLBUTRIN) 75 MG TABLET    Take 75 mg by mouth 2 (two) times daily.    CLONIDINE (CATAPRES) 0.1 MG TABLET    Give 0.2 mg by mouth every morning (0800) and 0.1 mg by mouth every evening (1800)   COAL TAR (NEUTROGENA T-GEL) 0.5 % SHAMPOO    Apply topically. Apply topically every 96 hours for dandruff weekly as needed   DICLOFENAC SODIUM  (VOLTAREN) 1 % GEL    Apply 2 g topically 3 (three) times daily. To right wrist   DOCUSATE SODIUM (COLACE) 100 MG CAPSULE    Take 1 capsule (100 mg total) by mouth 2 (two) times daily.   DORZOLAMIDE-TIMOLOL (COSOPT) 22.3-6.8 MG/ML OPHTHALMIC SOLUTION    Place 1 drop into both eyes 2 (two) times daily.   FENOFIBRATE (TRICOR) 145 MG TABLET    Take 145 mg by mouth every evening.    GABAPENTIN (NEURONTIN) 100 MG CAPSULE    Take 100 mg by mouth 2 (two) times daily.   INSULIN ASPART (NOVOLOG) 100 UNIT/ML INJECTION    Inject 5 Units into the skin 3 (three) times daily before meals. Inject as per sliding scale: if  0-150 =0 units; 151-450 = 8 units, subcutaneously before meals related to DM. Call MD for BS> 450   INSULIN GLARGINE (LANTUS) 100 UNIT/ML INJECTION    Inject 32 Units into the skin at bedtime.   ISOSORBIDE MONONITRATE (IMDUR) 60 MG 24 HR TABLET    Take 60 mg by mouth every morning.    LATANOPROST (XALATAN) 0.005 % OPHTHALMIC SOLUTION    Place 1 drop into both eyes at bedtime.     LIDOCAINE (LIDODERM) 5 %    Place 1 patch onto the skin every 12 (twelve) hours. Remove & Discard patch within 12 hours to right neck  And to left hip   LISINOPRIL (PRINIVIL,ZESTRIL) 10 MG TABLET    Take 10 mg by mouth daily.    LORAZEPAM (ATIVAN) 0.5 MG TABLET    Take one tablet by mouth every morning   MEMANTINE HCL ER (NAMENDA XR) 14 MG CP24    Take 1 tablet by mouth every morning.   METOPROLOL (LOPRESSOR) 50 MG TABLET    Take 50 mg by mouth 2 (two) times daily.   NUTRITIONAL SUPPLEMENTS (NUTRITIONAL SUPPLEMENT PO)    Take by mouth. HSG Mech Soft Texture, for diet   NYSTATIN (MYCOSTATIN/NYSTOP) POWDER    Apply topically 2 (two) times daily.   OMEPRAZOLE (PRILOSEC) 20 MG CAPSULE    Take 40 mg by mouth every morning.    ONDANSETRON (ZOFRAN) 4 MG TABLET    Take 4 mg by mouth every 8 (eight) hours as needed for nausea or vomiting.   OXYCODONE (OXYCONTIN) 30 MG 12 HR TABLET    Take  one tablet by mouth every 12 hours for  pain NO NOT CUT/CRUSH/CHEW   OXYCODONE HCL, ABUSE DETER, (OXAYDO) 5 MG TABA    Take 1 tablet by mouth every 8 (eight) hours as needed.   POLYETHYLENE GLYCOL POWDER (GLYCOLAX/MIRALAX) POWDER    Take 17 g by mouth 2 (two) times daily.    POTASSIUM CHLORIDE (KLOR-CON) 20 MEQ PACKET    Take 20 mEq by mouth 2 (two) times daily.   PROTEIN SUPPLEMENT (PROMOD) POWD    Take 1 scoop by mouth 2 (two) times daily.   SENNA (SENOKOT) 8.6 MG TABLET    Take 2 tablets by mouth 2 (two) times daily.    TORSEMIDE (DEMADEX) 20 MG TABLET    Take 20 mg by mouth 2 (two) times daily.  Modified Medications   No medications on file  Discontinued Medications   No medications on file     SIGNIFICANT DIAGNOSTIC EXAMS   03-14-14: right ankle tib/fib mri: 1. 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 2. T2 hyperintense probable draining abscess deep to ulceration. 3. Ankle effusion. This is probably reactive. Septic arthritis is less likely. 4. Infectious tenosynovitis of the peroneal tendons. 5. No abscess in the proximal RIGHT leg.  06-08-14: right hand x-ray; mild osteoarthritis  06-08-14: right wrist x-ray; osteoarthritis right wrist  06-09-14: pelvic and left hip x-ray: mild degenerative disease left hip no significant change since 09-28-13.   10-02-15: chest x-ray: no acute cardiopulmonary process    LABS REVIEWED:   12-09-14: glucose 159; bun 19; creat 1.44; k+ 3.4; na++139; hgb a1c 6.2 12-26-14: k+ 3.6  04-26-15: wbc 9.4; hgb 10.8; hct 32.4; mcv 92.0; plt 108; glucose 188; bun 29; creat 1.93; k+ 3.8; na++142; liver normal albumin 3.6; chol 98; ldl 39; trig 179; hdl 23; hgb a1c 6.8 urine micro-albumin: 55.8 05-08-15: hgb a1c 6.9  06-14-15; sed rate 20; CRP <0.5 08-30-15: hgb a1c 6.2  10-02-15: wbc 11.4; hgb 10.9; hct 33.1; mcv 93.2 ;plt 170 10-12-15: wbc 27.0; hgb 11.8; hct 34.9; mcv 92.6; plt 155; glucose 286; bun 35; create 2.50; k+ 4.2; na++ 134; mag 1.9  10-27-15:  glucose 195; bun 36; creat 1.81; k+ 4.2; na++ 142      Review of Systems Constitutional: Negative for appetite change and fatigue.  HENT: Negative for congestion.   Respiratory: Negative for cough, chest tightness and shortness of breath.   Cardiovascular: Negative for chest pain, palpitations and leg swelling.  Gastrointestinal: Negative for nausea, abdominal pain, diarrhea and constipation.  Musculoskeletal: Negative for myalgias and arthralgias.  Skin: no complaints     Neurological: Negative for dizziness.  Psychiatric/Behavioral: The patient is not nervous/anxious.       Physical Exam Constitutional: No distress.  Overweight   Eyes: Conjunctivae are normal.  Neck: Neck supple. No JVD present. No thyromegaly present.  Cardiovascular: Normal rate, regular rhythm and intact distal pulses.   Respiratory: Effort normal and breath sounds normal. No respiratory distress. [name] has no wheezes.  GI: Soft. Bowel sounds are normal. [name] exhibits no distension. There is no tenderness.  Musculoskeletal: [name] exhibits no edema.  Able to move all extremities   Lymphadenopathy:    [name] has no cervical adenopathy.  Neurological: [name] is alert.  Skin: Skin is warm and dry. [name] is not diaphoretic.    Psychiatric: [name] has a normal mood and affect.       ASSESSMENT/ PLAN:  1. Hypertension: will continue lopressor 50 mg twice daily; norvasc 10 mg  daily asa 81 mg daily; will continue  lisinopril  10 mg daily   Will continue clonidine 0.1 mg in the AM and 0.2 in the PM will monitor   2. Chronic diastolic heart failure: will continue demadex 20 mg twice daily lopressor 50 mg twice daily imdur 60 mg daily  2011: EF 60%   3. Constipation: will continue miralax twice daily; senna 2 tabs twice daily colace twice daily   4. Hypokalemia will continue  k+ 20 meq twice daily k+ is 4.2  5. GERD: will continue prilosec 40 mg daily   6. Dyslipidemia: will continue tricor 145 mg daily; will continue  lipitor  10 mg daily; ldl is 39; trig is 179  7. Diabetes:  hgb a1c is 6.2 ; will continue  lantus  32 units nightly and  novolog 5 units with meals with and additional 3 units for cbg >=150  8. Depression with anxiety will continue wellbutrin  75 mg twice daily; ativan 0.5 mg daily prior to AM care   for anxiety and will monitor  9. Vascular dementia: is without change; will continue namenda xr 14 mg daily and will monitor   10. Chronic pain: his pain is presently being managed will continue oxycontin 30 mg twice daily; neurontin 100 mg twice daily; lidoderm patch to his right neck daily and left hip; voltaren gel 2 gm to right wrist three times daily    12. Glaucoma; will continue latanoprost to both eyes nightly and cosopt to both eyes twice daily .   13. Anemia: his hgb is 11.8; will monitor   14. Diabetic peripheral neuropathy: will continue neurontin 100 mg twice daily   14. Right radial fracture and right femoral neck fracture: will follow up with orthopedics as directed; at this time; [name] is not having pain; will not make changes in his pain regimen and will monitor his status.   15: CKD stage IV: bun/creat 36/1.81; will monitor    Will check hgb a1c and lipids   MD is aware of resident's narcotic use and is in agreement with current plan of care. We will attempt to wean resident as apropriate   Synthia Innocent NP [facility]  Contact [phone] Monday through Friday 8am- 5pm  After hours call [phone]

## 2015-11-29 LAB — LIPID PANEL
CHOLESTEROL: 106 mg/dL (ref 0–200)
HDL: 26 mg/dL — AB (ref 35–70)
LDL Cholesterol: 58 mg/dL
Triglycerides: 108 mg/dL (ref 40–160)

## 2015-11-29 LAB — HEMOGLOBIN A1C: Hemoglobin A1C: 5.9

## 2015-12-04 ENCOUNTER — Non-Acute Institutional Stay (SKILLED_NURSING_FACILITY): Payer: Medicare Other | Admitting: Adult Health

## 2015-12-04 ENCOUNTER — Encounter: Payer: Self-pay | Admitting: Adult Health

## 2015-12-04 DIAGNOSIS — B86 Scabies: Secondary | ICD-10-CM | POA: Diagnosis not present

## 2015-12-04 NOTE — Progress Notes (Signed)
[name], [name]   DOB: 11-12-1929, 82 y.o.   MRN: [id number]   [facility] Room Number: 115-A Place of Service:  SNF (31)   CODE STATUS: DNR  No Known Allergies  Chief Complaint  Patient presents with  . Acute Visit    Scabies    HPI:  He has a scabies rash on his arms; legs and abdomen. There are no reports of fever present.  He is complaining of itching present.   Past Medical History:  Diagnosis Date  . 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 Anxiety   . At high risk for falls   . Chronic diastolic congestive heart failure (HCC) 06/13/2012   Echo in 2011 with grade 1 diastolic dysfunction and normal systolic function with EF 60%.     . DDD (degenerative disc disease), lumbosacral   . Depression   . Depression with anxiety 11/27/2013  . Diabetic peripheral neuropathy (HCC)   . Dyslipidemia   . GERD (gastroesophageal reflux disease)   . History of colon polyps   . History of diabetic ulcer of foot   . History of MRSA infection    Hx chronic diabetic ulcer's secondary MRSA  . Hypertension   . Insulin dependent type 2 diabetes mellitus (HCC)   . Peripheral vascular disease (HCC)   . Portal hypertension (HCC) 01/15/2010   Qualifier: Diagnosis of  By: Everardo AllEllison MD, Cleophas DunkerSean A   . Renal insufficiency   . Ulcer of right ankle (HCC)   . UNSPECIFIED PERIPHERAL VASCULAR DISEASE 01/15/2010   Qualifier: Diagnosis of  By: Everardo AllEllison MD, Cleophas DunkerSean A   . Wheelchair dependent     Past Surgical History:  Procedure Laterality Date  . COLONOSCOPY WITH ESOPHAGOGASTRODUODENOSCOPY (EGD)  09-21-2002  . INCISION AND DRAINAGE OF WOUND Right 01/10/2014   Procedure: IRRIGATION AND DEBRIDEMENT RIGHT ANKLE WOUND WITH PLACEMENT OF ACELL;  Surgeon: Wayland Denislaire [name], DO;  Location: [facility];  Service: Plastics;  Laterality: Right;  . INGUINAL HERNIA REPAIR Left 11-30-2001  . LAPAROSCOPIC CHOLECYSTECTOMY    . TOE AMPUTATION    . TRANSTHORACIC  ECHOCARDIOGRAM  12-20-2009   severe LVH/  ef 60%/  grade I diastolic dysfunction/  AV sclerosis without stenosis/  mild LAE and RAE/      Social History   Social History  . Marital status: Married    Spouse name: N/A  . Number of children: N/A  . Years of education: N/A   Occupational History  .  Retired    Retired   Social History Main Topics  . Smoking status: Former Smoker    Types: Cigarettes  . Smokeless tobacco: Never Used  . Alcohol use No     Comment: hx alcohol abuse  in remission  . Drug use: No  . Sexual activity: No   Other Topics Concern  . Not on file   Social History Narrative   L[facility]   Family History  Problem Relation Age of Onset  . Diabetes Neg Hx     No DM in immediate family      VITAL SIGNS BP (!) 149/74   Pulse 77   Temp 97.6 F (36.4 C) (Oral)   Resp 18   Ht 5\' 10"  (1.778 m)   Wt 231 lb (104.8 kg)   SpO2 98%   BMI 33.15 kg/m   Patient's Medications  New Prescriptions   No medications on file  Previous Medications  ACETAMINOPHEN (TYLENOL) 325 MG TABLET    Take 2 tablets (650 mg total) by mouth every 6 (six) hours as needed for mild pain.   AMLODIPINE (NORVASC) 5 MG TABLET    Take 10 mg by mouth every morning.    ASCORBIC ACID (VITAMIN C) 500 MG TABLET    Take 500 mg by mouth 2 (two) times daily.   ASPIRIN 81 MG TABLET    Take 81 mg by mouth every morning.    ATORVASTATIN (LIPITOR) 10 MG TABLET    Take 1 tablet (10 mg total) by mouth daily.   BUPROPION (WELLBUTRIN) 75 MG TABLET    Take 75 mg by mouth 2 (two) times daily.    CLONIDINE (CATAPRES) 0.1 MG TABLET    Give 0.2 mg by mouth every morning (0800) and 0.1 mg by mouth every evening (1800)   COAL TAR (NEUTROGENA T-GEL) 0.5 % SHAMPOO    Apply topically. Apply topically every 96 hours for dandruff weekly as needed   DICLOFENAC SODIUM (VOLTAREN) 1 % GEL    Apply 2 g topically 3 (three) times daily. To right wrist   DOCUSATE SODIUM (COLACE) 100 MG CAPSULE     Take 1 capsule (100 mg total) by mouth 2 (two) times daily.   DORZOLAMIDE-TIMOLOL (COSOPT) 22.3-6.8 MG/ML OPHTHALMIC SOLUTION    Place 1 drop into both eyes 2 (two) times daily.   FENOFIBRATE (TRICOR) 145 MG TABLET    Take 145 mg by mouth every evening.    GABAPENTIN (NEURONTIN) 100 MG CAPSULE    Take 100 mg by mouth 2 (two) times daily.   INSULIN ASPART (NOVOLOG) 100 UNIT/ML INJECTION    Inject 5 Units into the skin 3 (three) times daily before meals. Inject as per sliding scale: if  0-150 =0 units; 151-450 = 8 units, subcutaneously before meals related to DM. Call MD for BS> 450   INSULIN GLARGINE (LANTUS) 100 UNIT/ML INJECTION    Inject 32 Units into the skin at bedtime.   ISOSORBIDE MONONITRATE (IMDUR) 60 MG 24 HR TABLET    Take 60 mg by mouth every morning.    LATANOPROST (XALATAN) 0.005 % OPHTHALMIC SOLUTION    Place 1 drop into both eyes at bedtime.     LIDOCAINE (LIDODERM) 5 %    Place 1 patch onto the skin every 12 (twelve) hours. Remove & Discard patch within 12 hours to right neck  And to left hip   LISINOPRIL (PRINIVIL,ZESTRIL) 10 MG TABLET    Take 10 mg by mouth daily.    LORAZEPAM (ATIVAN) 0.5 MG TABLET    Take one tablet by mouth every morning   MEMANTINE HCL ER (NAMENDA XR) 14 MG CP24    Take 1 tablet by mouth every morning.   METOPROLOL (LOPRESSOR) 50 MG TABLET    Take 50 mg by mouth 2 (two) times daily.   NUTRITIONAL SUPPLEMENTS (NUTRITIONAL SUPPLEMENT PO)    Take by mouth. HSG Mech Soft Texture, for diet   NYSTATIN (MYCOSTATIN/NYSTOP) POWDER    Apply topically 2 (two) times daily.   OMEPRAZOLE (PRILOSEC) 20 MG CAPSULE    Take 40 mg by mouth every morning.    ONDANSETRON (ZOFRAN) 4 MG TABLET    Take 4 mg by mouth every 8 (eight) hours as needed for nausea or vomiting.   OXYCODONE (OXYCONTIN) 30 MG 12 HR TABLET    Take one tablet by mouth every 12 hours for pain NO NOT CUT/CRUSH/CHEW   OXYCODONE HCL, ABUSE DETER, (OXAYDO) 5 MG  TABA    Take 1 tablet by mouth every 8 (eight) hours as  needed.   POLYETHYLENE GLYCOL POWDER (GLYCOLAX/MIRALAX) POWDER    Take 17 g by mouth 2 (two) times daily.    POTASSIUM CHLORIDE (KLOR-CON) 20 MEQ PACKET    Take 20 mEq by mouth 2 (two) times daily.   PROTEIN SUPPLEMENT (PROMOD) POWD    Take 1 scoop by mouth 2 (two) times daily.   SENNA (SENOKOT) 8.6 MG TABLET    Take 2 tablets by mouth 2 (two) times daily.    TORSEMIDE (DEMADEX) 20 MG TABLET    Take 20 mg by mouth 2 (two) times daily.  Modified Medications   No medications on file  Discontinued Medications   PERMETHRIN (ELIMITE) 5 % CREAM         SIGNIFICANT DIAGNOSTIC EXAMS  03-14-14: right ankle tib/fib mri: 1. 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 2. T2 hyperintense probable draining abscess deep to ulceration. 3. Ankle effusion. This is probably reactive. Septic arthritis is less likely. 4. Infectious tenosynovitis of the peroneal tendons. 5. No abscess in the proximal RIGHT leg.  06-08-14: right hand x-ray; mild osteoarthritis  06-08-14: right wrist x-ray; osteoarthritis right wrist  06-09-14: pelvic and left hip x-ray: mild degenerative disease left hip no significant change since 09-28-13.   10-02-15: chest x-ray: no acute cardiopulmonary process    LABS REVIEWED:   12-09-14: glucose 159; bun 19; creat 1.44; k+ 3.4; na++139; hgb a1c 6.2 12-26-14: k+ 3.6  04-26-15: wbc 9.4; hgb 10.8; hct 32.4; mcv 92.0; plt 108; glucose 188; bun 29; creat 1.93; k+ 3.8; na++142; liver normal albumin 3.6; chol 98; ldl 39; trig 179; hdl 23; hgb a1c 6.8 urine micro-albumin: 55.8 05-08-15: hgb a1c 6.9  06-14-15; sed rate 20; CRP <0.5 08-30-15: hgb a1c 6.2  10-02-15: wbc 11.4; hgb 10.9; hct 33.1; mcv 93.2 ;plt 170 10-12-15: wbc 27.0; hgb 11.8; hct 34.9; mcv 92.6; plt 155; glucose 286; bun 35; create 2.50; k+ 4.2; na++ 134; mag 1.9  10-27-15: glucose 195; bun 36; creat 1.81; k+ 4.2; na++ 142      Review of Systems Constitutional: Negative for appetite  change and fatigue.  HENT: Negative for congestion.   Respiratory: Negative for cough, chest tightness and shortness of breath.   Cardiovascular: Negative for chest pain, palpitations and leg swelling.  Gastrointestinal: Negative for nausea, abdominal pain, diarrhea and constipation.  Musculoskeletal: Negative for myalgias and arthralgias.  Skin: has itchy rash  Neurological: Negative for dizziness.  Psychiatric/Behavioral: The patient is not nervous/anxious.       Physical Exam Constitutional: No distress.  Overweight   Eyes: Conjunctivae are normal.  Neck: Neck supple. No JVD present. No thyromegaly present.  Cardiovascular: Normal rate, regular rhythm and intact distal pulses.   Respiratory: Effort normal and breath sounds normal. No respiratory distress. He has no wheezes.  GI: Soft. Bowel sounds are normal. He exhibits no distension. There is no tenderness.  Musculoskeletal: He exhibits no edema.  Able to move all extremities   Lymphadenopathy:    He has no cervical adenopathy.  Neurological: He is alert.  Skin: scabies rash on legs; arms and abdomen  Psychiatric: He has a normal mood and affect.       ASSESSMENT/ PLAN:  1.  Scabies: will begin permethrin cream and will repeat in 14 days; will setup derm consult   MD is aware of resident's narcotic use and is in agreement with current plan of care.  We will attempt to wean resident as appropriate.     Synthia Innocent NP [facility]  Contact [phone] Monday through Friday 8am- 5pm  After hours call [phone]

## 2015-12-28 ENCOUNTER — Encounter: Payer: Self-pay | Admitting: Adult Health

## 2015-12-28 ENCOUNTER — Non-Acute Institutional Stay (SKILLED_NURSING_FACILITY): Payer: Medicare Other | Admitting: Adult Health

## 2015-12-28 DIAGNOSIS — E1142 Type 2 diabetes mellitus with diabetic polyneuropathy: Secondary | ICD-10-CM

## 2015-12-28 DIAGNOSIS — I5032 Chronic diastolic (congestive) heart failure: Secondary | ICD-10-CM

## 2015-12-28 DIAGNOSIS — S62101D Fracture of unspecified carpal bone, right wrist, subsequent encounter for fracture with routine healing: Secondary | ICD-10-CM

## 2015-12-28 DIAGNOSIS — I110 Hypertensive heart disease with heart failure: Secondary | ICD-10-CM

## 2015-12-28 DIAGNOSIS — S72001D Fracture of unspecified part of neck of right femur, subsequent encounter for closed fracture with routine healing: Secondary | ICD-10-CM

## 2015-12-28 DIAGNOSIS — N184 Chronic kidney disease, stage 4 (severe): Secondary | ICD-10-CM | POA: Diagnosis not present

## 2015-12-28 DIAGNOSIS — G894 Chronic pain syndrome: Secondary | ICD-10-CM

## 2015-12-28 DIAGNOSIS — E1169 Type 2 diabetes mellitus with other specified complication: Secondary | ICD-10-CM

## 2015-12-28 DIAGNOSIS — E1149 Type 2 diabetes mellitus with other diabetic neurological complication: Secondary | ICD-10-CM | POA: Diagnosis not present

## 2015-12-28 DIAGNOSIS — E785 Hyperlipidemia, unspecified: Secondary | ICD-10-CM

## 2015-12-28 NOTE — Progress Notes (Signed)
[name], male   DOB: 1929/11/22, 82 y.o.   MRN: [id number]   Location:   [facility] Room Number: 115-A Place of Service:  SNF (31)   CODE STATUS: DNR  No Known Allergies  Chief Complaint  Patient presents with  . Medical Management of Chronic Issues    Follow up    HPI:  He is a long term resident of this facility being seen for the management of his chronic illnesses. He is telling me that he has some sinus congestion present. He does spend most of his time in bed per his choice. There are no nursing concerns at this time.    Past Medical History:  Diagnosis Date  . 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 Anxiety   . At high risk for falls   . Chronic diastolic congestive heart failure (HCC) 06/13/2012   Echo in 2011 with grade 1 diastolic dysfunction and normal systolic function with EF 60%.     . DDD (degenerative disc disease), lumbosacral   . Depression   . Depression with anxiety 11/27/2013  . Diabetic peripheral neuropathy (HCC)   . Dyslipidemia   . GERD (gastroesophageal reflux disease)   . History of colon polyps   . History of diabetic ulcer of foot   . History of MRSA infection    Hx chronic diabetic ulcer's secondary MRSA  . Hypertension   . Insulin dependent type 2 diabetes mellitus (HCC)   . Peripheral vascular disease (HCC)   . Portal hypertension (HCC) 01/15/2010   Qualifier: Diagnosis of  By: Everardo All MD, Cleophas Dunker   . Renal insufficiency   . Ulcer of right ankle (HCC)   . UNSPECIFIED PERIPHERAL VASCULAR DISEASE 01/15/2010   Qualifier: Diagnosis of  By: Everardo All MD, Cleophas Dunker   . Wheelchair dependent     Past Surgical History:  Procedure Laterality Date  . COLONOSCOPY WITH ESOPHAGOGASTRODUODENOSCOPY (EGD)  09-21-2002  . INCISION AND DRAINAGE OF WOUND Right 01/10/2014   Procedure: IRRIGATION AND DEBRIDEMENT RIGHT ANKLE WOUND WITH PLACEMENT OF ACELL;  Surgeon: Wayland Denis, DO;  Location: [facility];  Service:  Plastics;  Laterality: Right;  . INGUINAL HERNIA REPAIR Left 11-30-2001  . LAPAROSCOPIC CHOLECYSTECTOMY    . TOE AMPUTATION    . TRANSTHORACIC ECHOCARDIOGRAM  12-20-2009   severe LVH/  ef 60%/  grade I diastolic dysfunction/  AV sclerosis without stenosis/  mild LAE and RAE/      Social History   Social History  . Marital status: Married    Spouse name: N/A  . Number of children: N/A  . Years of education: N/A   Occupational History  .  Retired    Retired   Social History Main Topics  . Smoking status: Former Smoker    Types: Cigarettes  . Smokeless tobacco: Never Used  . Alcohol use No     Comment: hx alcohol abuse  in remission  . Drug use: No  . Sexual activity: No   Other Topics Concern  . Not on file   Social History Narrative   Lives at [facility]   Family History  Problem Relation Age of Onset  . Diabetes Neg Hx     No DM in immediate family      VITAL SIGNS BP (!) 154/69   Pulse (!) 55   Temp 98.3 F (36.8 C) (Oral)   Resp 18   Ht 5\' 10"  (1.778 m)   Wt 226  lb 6 oz (102.7 kg)   SpO2 98%   BMI 32.48 kg/m   Patient's Medications  New Prescriptions   No medications on file  Previous Medications   ACETAMINOPHEN (TYLENOL) 325 MG TABLET    Take 2 tablets (650 mg total) by mouth every 6 (six) hours as needed for mild pain.   AMLODIPINE (NORVASC) 5 MG TABLET    Take 10 mg by mouth every morning.    ASCORBIC ACID (VITAMIN C) 500 MG TABLET    Take 500 mg by mouth 2 (two) times daily.   ASPIRIN 81 MG TABLET    Take 81 mg by mouth every morning.    ATORVASTATIN (LIPITOR) 10 MG TABLET    Take 1 tablet (10 mg total) by mouth daily.   BUPROPION (WELLBUTRIN) 75 MG TABLET    Take 75 mg by mouth 2 (two) times daily.    CLONIDINE (CATAPRES) 0.1 MG TABLET    Give 0.2 mg by mouth every morning (0800) and 0.1 mg by mouth every evening (1800)   COAL TAR (NEUTROGENA T-GEL) 0.5 % SHAMPOO    Apply topically. Apply topically every 96 hours for dandruff weekly  as needed   DICLOFENAC SODIUM (VOLTAREN) 1 % GEL    Apply 2 g topically 3 (three) times daily. To right wrist   DOCUSATE SODIUM (COLACE) 100 MG CAPSULE    Take 1 capsule (100 mg total) by mouth 2 (two) times daily.   DORZOLAMIDE-TIMOLOL (COSOPT) 22.3-6.8 MG/ML OPHTHALMIC SOLUTION    Place 1 drop into both eyes 2 (two) times daily.   FENOFIBRATE (TRICOR) 145 MG TABLET    Take 145 mg by mouth every evening.    FUROSEMIDE (LASIX) 40 MG TABLET    Take 40 mg by mouth 2 (two) times daily.   GABAPENTIN (NEURONTIN) 100 MG CAPSULE    Take 100 mg by mouth 2 (two) times daily.   INSULIN ASPART (NOVOLOG) 100 UNIT/ML INJECTION    Inject 5 Units into the skin 3 (three) times daily before meals. Inject as per sliding scale: if  0-150 =0 units; 151-450 = 8 units, subcutaneously before meals related to DM. Call MD for BS> 450   INSULIN GLARGINE (LANTUS) 100 UNIT/ML INJECTION    Inject 32 Units into the skin at bedtime.   ISOSORBIDE MONONITRATE (IMDUR) 60 MG 24 HR TABLET    Take 60 mg by mouth every morning.    LATANOPROST (XALATAN) 0.005 % OPHTHALMIC SOLUTION    Place 1 drop into both eyes at bedtime.     LIDOCAINE (LIDODERM) 5 %    Place 1 patch onto the skin every 12 (twelve) hours. Remove & Discard patch within 12 hours to right neck  And to left hip   LISINOPRIL (PRINIVIL,ZESTRIL) 10 MG TABLET    Take 10 mg by mouth daily.    LORAZEPAM (ATIVAN) 0.5 MG TABLET    Take one tablet by mouth every morning   MEMANTINE HCL ER (NAMENDA XR) 14 MG CP24    Take 1 tablet by mouth every morning.   METOPROLOL (LOPRESSOR) 50 MG TABLET    Take 50 mg by mouth 2 (two) times daily.   NUTRITIONAL SUPPLEMENTS (NUTRITIONAL SUPPLEMENT PO)    Take by mouth. HSG Mech Soft Texture, for diet   NYSTATIN (MYCOSTATIN/NYSTOP) POWDER    Apply topically 2 (two) times daily.   OMEPRAZOLE (PRILOSEC) 20 MG CAPSULE    Take 40 mg by mouth every morning.    ONDANSETRON (ZOFRAN) 4 MG TABLET  Take 4 mg by mouth every 8 (eight) hours as needed for  nausea or vomiting.   OXYCODONE (OXYCONTIN) 30 MG 12 HR TABLET    Take one tablet by mouth every 12 hours for pain NO NOT CUT/CRUSH/CHEW   POLYETHYLENE GLYCOL POWDER (GLYCOLAX/MIRALAX) POWDER    Take 17 g by mouth 2 (two) times daily.    POTASSIUM CHLORIDE (KLOR-CON) 20 MEQ PACKET    Take 20 mEq by mouth 2 (two) times daily.   PROTEIN SUPPLEMENT (PROMOD) POWD    Take 1 scoop by mouth 2 (two) times daily.   SENNA (SENOKOT) 8.6 MG TABLET    Take 2 tablets by mouth 2 (two) times daily.   Modified Medications   No medications on file  Discontinued Medications   OXYCODONE HCL, ABUSE DETER, (OXAYDO) 5 MG TABA    Take 1 tablet by mouth every 8 (eight) hours as needed.   TORSEMIDE (DEMADEX) 20 MG TABLET    Take 20 mg by mouth 2 (two) times daily.     SIGNIFICANT DIAGNOSTIC EXAMS  03-14-14: right ankle tib/fib mri: 1. 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 2. T2 hyperintense probable draining abscess deep to ulceration. 3. Ankle effusion. This is probably reactive. Septic arthritis is less likely. 4. Infectious tenosynovitis of the peroneal tendons. 5. No abscess in the proximal RIGHT leg.  06-08-14: right hand x-ray; mild osteoarthritis  06-08-14: right wrist x-ray; osteoarthritis right wrist  06-09-14: pelvic and left hip x-ray: mild degenerative disease left hip no significant change since 09-28-13.   10-02-15: chest x-ray: no acute cardiopulmonary process   10-12-15: ct of head and cervical spine: No acute abnormality head or cervical spine. Advanced multilevel spondylosis with postoperative change of decompression C4-C6 identified. Bilateral pleural effusions appear small but are incompletely seen. Extensive atherosclerosis.  10-12-15: chest x-ray: 1. Mild central vascular congestion with bilateral interstitial thickening. Findings are similar to prior exams. No convincing pulmonary edema/congestive heart failure. No evidence of  pneumonia.  10-12-15: right knee x-ray: No acute bony or joint abnormality is seen. Degenerative change is present about the knee. No joint effusion. Atherosclerosis noted.  10-12-15: right wrist x-ray: Moderately displaced and comminuted distal right radial fracture is noted.  10-12-15: right hip and pelvic x-ray: Displaced fracture of the right femoral neck.     LABS REVIEWED:   04-26-15: wbc 9.4; hgb 10.8; hct 32.4; mcv 92.0; plt 108; glucose 188; bun 29; creat 1.93; k+ 3.8; na++142; liver normal albumin 3.6; chol 98; ldl 39; trig 179; hdl 23; hgb a1c 6.8 urine micro-albumin: 55.8 05-08-15: hgb a1c 6.9  06-14-15; sed rate 20; CRP <0.5 08-30-15: hgb a1c 6.2  10-02-15: wbc 11.4; hgb 10.9; hct 33.1; mcv 93.2 ;plt 170 10-12-15: wbc 27.0; hgb 11.8; hct 34.9; mcv 92.6; plt 155; glucose 286; bun 35; create 2.50; k+ 4.2; na++ 134; mag 1.9  10-27-15: glucose 195; bun 36; creat 1.81; k+ 4.2; na++ 142  11-29-15: chol 106; ldl 58; trig 108; hdl 26; hgb a1c 5.9      Review of Systems Constitutional: Negative for appetite change and fatigue.  HENT: has congestion    Respiratory: Negative for cough, chest tightness and shortness of breath.   Cardiovascular: Negative for chest pain, palpitations and leg swelling.  Gastrointestinal: Negative for nausea, abdominal pain, diarrhea and constipation.  Musculoskeletal: Negative for myalgias and arthralgias.  Skin: no complaints  Neurological: Negative for dizziness.  Psychiatric/Behavioral: The patient is not nervous/anxious.  Physical Exam Constitutional: No distress.  Overweight   Eyes: Conjunctivae are normal.  Neck: Neck supple. No JVD present. No thyromegaly present.  Cardiovascular: Normal rate, regular rhythm and intact distal pulses.   Respiratory: Effort normal and breath sounds normal. No respiratory distress. He has no wheezes.  GI: Soft. Bowel sounds are normal. He exhibits no distension. There is no tenderness.  Musculoskeletal: He  exhibits no edema.  Able to move all extremities   Lymphadenopathy:    He has no cervical adenopathy.  Neurological: He is alert.  Skin: warm and dray  Psychiatric: He has a normal mood and affect.       ASSESSMENT/ PLAN:   1. Hypertension: will continue lopressor 50 mg twice daily; norvasc 10 mg daily asa 81 mg daily; will continue  lisinopril  10 mg daily   Will continue clonidine 0.1 mg in the AM and 0.2 in the PM will monitor   2. Chronic diastolic heart failure: will continue lasix 40  mg twice daily lopressor 50 mg twice daily imdur 60 mg daily  2011: EF 60%   3. Constipation: will continue miralax twice daily; senna 2 tabs twice daily colace twice daily   4. Hypokalemia will continue  k+ 20 meq twice daily k+ is 4.2  5. GERD: will continue prilosec 40 mg daily   6. Dyslipidemia: will continue tricor 145 mg daily; will stop   lipitor 10 mg daily; ldl is 58; trig is 108  7. Diabetes:  hgb a1c is 5.9 ; will continue  novolog 8 units with meals  for cbg >=150  And will lower lantus to 25 units   8. Depression with anxiety will continue wellbutrin  75 mg twice daily; ativan 0.5 mg daily prior to AM care   for anxiety and will monitor  9. Vascular dementia: is without change; will continue namenda xr 14 mg daily and will monitor   10. Chronic pain: his pain is presently being managed will continue oxycontin 30 mg twice daily; neurontin 100 mg twice daily; lidoderm patch to his right neck daily and left hip; voltaren gel 2 gm to right wrist three times daily    12. Glaucoma; will continue latanoprost to both eyes nightly and cosopt to both eyes twice daily .   13. Anemia: his hgb is 11.8; will monitor   14. Diabetic peripheral neuropathy: will continue neurontin 100 mg twice daily   14. Right radial fracture and right femoral neck fracture: will follow up with orthopedics as directed; at this time; he is not having pain; will not make changes in his pain regimen and will  monitor his status.   15: CKD stage IV: bun/creat 36/1.81; will monitor     MD is aware of resident's narcotic use and is in agreement with current plan of care. We will attempt to wean resident as appropriate.    Synthia Innocenteborah Arryn Terrones NP [facility]  Contact [phone] Monday through Friday 8am- 5pm  After hours call [phone]

## 2016-01-29 ENCOUNTER — Non-Acute Institutional Stay (SKILLED_NURSING_FACILITY): Payer: Medicare Other | Admitting: Adult Health

## 2016-01-29 ENCOUNTER — Encounter: Payer: Self-pay | Admitting: Adult Health

## 2016-01-29 DIAGNOSIS — E1142 Type 2 diabetes mellitus with diabetic polyneuropathy: Secondary | ICD-10-CM

## 2016-01-29 DIAGNOSIS — F0150 Vascular dementia without behavioral disturbance: Secondary | ICD-10-CM

## 2016-01-29 DIAGNOSIS — K219 Gastro-esophageal reflux disease without esophagitis: Secondary | ICD-10-CM | POA: Diagnosis not present

## 2016-01-29 DIAGNOSIS — K5903 Drug induced constipation: Secondary | ICD-10-CM | POA: Diagnosis not present

## 2016-01-29 DIAGNOSIS — I110 Hypertensive heart disease with heart failure: Secondary | ICD-10-CM

## 2016-01-29 DIAGNOSIS — N184 Chronic kidney disease, stage 4 (severe): Secondary | ICD-10-CM

## 2016-01-29 DIAGNOSIS — E785 Hyperlipidemia, unspecified: Secondary | ICD-10-CM | POA: Diagnosis not present

## 2016-01-29 DIAGNOSIS — E1149 Type 2 diabetes mellitus with other diabetic neurological complication: Secondary | ICD-10-CM

## 2016-01-29 DIAGNOSIS — E1169 Type 2 diabetes mellitus with other specified complication: Secondary | ICD-10-CM | POA: Diagnosis not present

## 2016-01-29 DIAGNOSIS — I5032 Chronic diastolic (congestive) heart failure: Secondary | ICD-10-CM

## 2016-01-29 NOTE — Progress Notes (Addendum)
[name], male   DOB: Oct 19, 1929, 82 y.o.   MRN: [id number]   [facility] Room Number: 115-A Place of Service:  SNF (31)   CODE STATUS: DNR  No Known Allergies  Chief Complaint  Patient presents with  . Medical Management of Chronic Issues    Follow up    HPI:  He is a long term resident of this facility being seen for the management of his chronic illnesses. Overall his status is stable. He does spend most of his time in room. He is not voicing any complaints. There are no nursing concerns at this time.    Past Medical History:  Diagnosis Date  . 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 Anxiety   . At high risk for falls   . Chronic diastolic congestive heart failure (HCC) 06/13/2012   Echo in 2011 with grade 1 diastolic dysfunction and normal systolic function with EF 60%.     . DDD (degenerative disc disease), lumbosacral   . Depression   . Depression with anxiety 11/27/2013  . Diabetic peripheral neuropathy (HCC)   . Dyslipidemia   . GERD (gastroesophageal reflux disease)   . History of colon polyps   . History of diabetic ulcer of foot   . History of MRSA infection    Hx chronic diabetic ulcer's secondary MRSA  . Hypertension   . Insulin dependent type 2 diabetes mellitus (HCC)   . Peripheral vascular disease (HCC)   . Portal hypertension (HCC) 01/15/2010   Qualifier: Diagnosis of  By: Everardo AllEllison MD, Cleophas DunkerSean A   . Renal insufficiency   . Ulcer of right ankle (HCC)   . UNSPECIFIED PERIPHERAL VASCULAR DISEASE 01/15/2010   Qualifier: Diagnosis of  By: Everardo AllEllison MD, Cleophas DunkerSean A   . Wheelchair dependent     Past Surgical History:  Procedure Laterality Date  . COLONOSCOPY WITH ESOPHAGOGASTRODUODENOSCOPY (EGD)  09-21-2002  . INCISION AND DRAINAGE OF WOUND Right 01/10/2014   Procedure: IRRIGATION AND DEBRIDEMENT RIGHT ANKLE WOUND WITH PLACEMENT OF ACELL;  Surgeon: Wayland Denislaire [name], DO;  Location: [facility];  Service: Plastics;   Laterality: Right;  . INGUINAL HERNIA REPAIR Left 11-30-2001  . LAPAROSCOPIC CHOLECYSTECTOMY    . TOE AMPUTATION    . TRANSTHORACIC ECHOCARDIOGRAM  12-20-2009   severe LVH/  ef 60%/  grade I diastolic dysfunction/  AV sclerosis without stenosis/  mild LAE and RAE/      Social History   Social History  . Marital status: Married    Spouse name: N/A  . Number of children: N/A  . Years of education: N/A   Occupational History  .  Retired    Retired   Social History Main Topics  . Smoking status: Former Smoker    Types: Cigarettes  . Smokeless tobacco: Never Used  . Alcohol use No     Comment: hx alcohol abuse  in remission  . Drug use: No  . Sexual activity: No   Other Topics Concern  . Not on file   Social History Narrative   [facility]   Family History  Problem Relation Age of Onset  . Diabetes Neg Hx     No DM in immediate family      VITAL SIGNS BP 132/60   Pulse 67   Temp 98.2 F (36.8 C) (Oral)   Resp 20   Ht 5\' 10"  (1.778 m)   Wt 220 lb (99.8 kg)  SpO2 99%   BMI 31.57 kg/m   Patient's Medications  New Prescriptions   No medications on file  Previous Medications   ACETAMINOPHEN (TYLENOL) 325 MG TABLET    Take 2 tablets (650 mg total) by mouth every 6 (six) hours as needed for mild pain.   AMLODIPINE (NORVASC) 5 MG TABLET    Take 10 mg by mouth every morning.    ASCORBIC ACID (VITAMIN C) 500 MG TABLET    Take 500 mg by mouth 2 (two) times daily.   ASPIRIN 81 MG TABLET    Take 81 mg by mouth every morning.    BUPROPION (WELLBUTRIN) 75 MG TABLET    Take 75 mg by mouth 2 (two) times daily.    CLONIDINE (CATAPRES) 0.1 MG TABLET    Give 0.2 mg by mouth every morning (0800) and 0.1 mg by mouth every evening (1800)   COAL TAR (NEUTROGENA T-GEL) 0.5 % SHAMPOO    Apply topically. Apply topically every 96 hours for dandruff weekly as needed   DICLOFENAC SODIUM (VOLTAREN) 1 % GEL    Apply 2 g topically 3 (three) times daily. To right wrist     DOCUSATE SODIUM (COLACE) 100 MG CAPSULE    Take 1 capsule (100 mg total) by mouth 2 (two) times daily.   DORZOLAMIDE-TIMOLOL (COSOPT) 22.3-6.8 MG/ML OPHTHALMIC SOLUTION    Place 1 drop into both eyes 2 (two) times daily.   FAMOTIDINE (PEPCID) 20 MG TABLET    Take 20 mg by mouth 2 (two) times daily.   FENOFIBRATE (TRICOR) 145 MG TABLET    Take 145 mg by mouth every evening.    FLUTICASONE (FLONASE) 50 MCG/ACT NASAL SPRAY    Place 2 sprays into both nostrils daily.   FUROSEMIDE (LASIX) 40 MG TABLET    Take 40 mg by mouth 2 (two) times daily.   GABAPENTIN (NEURONTIN) 100 MG CAPSULE    Take 100 mg by mouth 2 (two) times daily.   INSULIN ASPART (NOVOLOG) 100 UNIT/ML INJECTION    Inject 5 Units into the skin 3 (three) times daily before meals. Inject as per sliding scale: if  0-150 =0 units; 151-450 = 8 units, subcutaneously before meals related to DM. Call MD for BS> 450   INSULIN GLARGINE (LANTUS) 100 UNIT/ML INJECTION    Inject 25 Units into the skin at bedtime.    ISOSORBIDE MONONITRATE (IMDUR) 60 MG 24 HR TABLET    Take 60 mg by mouth every morning.    LATANOPROST (XALATAN) 0.005 % OPHTHALMIC SOLUTION    Place 1 drop into both eyes at bedtime.     LIDOCAINE (LIDODERM) 5 %    Place 1 patch onto the skin every 12 (twelve) hours. Remove & Discard patch within 12 hours to right neck  And to left hip   LISINOPRIL (PRINIVIL,ZESTRIL) 10 MG TABLET    Take 10 mg by mouth daily.    LORAZEPAM (ATIVAN) 0.5 MG TABLET    Take one tablet by mouth every morning   MEMANTINE HCL ER (NAMENDA XR) 14 MG CP24    Take 1 tablet by mouth every morning.   METOPROLOL (LOPRESSOR) 50 MG TABLET    Take 50 mg by mouth 2 (two) times daily.   NUTRITIONAL SUPPLEMENTS (NUTRITIONAL SUPPLEMENT PO)    Take by mouth. HSG Mech Soft Texture, for diet   NYSTATIN (MYCOSTATIN/NYSTOP) POWDER    Apply topically 2 (two) times daily.   ONDANSETRON (ZOFRAN) 4 MG TABLET    Take 4 mg  by mouth every 8 (eight) hours as needed for nausea or  vomiting.   OXYCODONE (OXYCONTIN) 30 MG 12 HR TABLET    Take one tablet by mouth every 12 hours for pain NO NOT CUT/CRUSH/CHEW   POLYETHYLENE GLYCOL POWDER (GLYCOLAX/MIRALAX) POWDER    Take 17 g by mouth 2 (two) times daily.    POTASSIUM CHLORIDE (KLOR-CON) 20 MEQ PACKET    Take 20 mEq by mouth 2 (two) times daily.   PROTEIN SUPPLEMENT (PROMOD) POWD    Take 1 scoop by mouth 2 (two) times daily.   SENNA (SENOKOT) 8.6 MG TABLET    Take 2 tablets by mouth 2 (two) times daily.   Modified Medications   No medications on file  Discontinued Medications   ATORVASTATIN (LIPITOR) 10 MG TABLET    Take 1 tablet (10 mg total) by mouth daily.   OMEPRAZOLE (PRILOSEC) 20 MG CAPSULE    Take 40 mg by mouth every morning.      SIGNIFICANT DIAGNOSTIC EXAMS  03-14-14: right ankle tib/fib mri: 1. 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 2. T2 hyperintense probable draining abscess deep to ulceration. 3. Ankle effusion. This is probably reactive. Septic arthritis is less likely. 4. Infectious tenosynovitis of the peroneal tendons. 5. No abscess in the proximal RIGHT leg.  06-09-14: pelvic and left hip x-ray: mild degenerative disease left hip no significant change since 09-28-13.   10-02-15: chest x-ray: no acute cardiopulmonary process   10-12-15: ct of head and cervical spine: No acute abnormality head or cervical spine. Advanced multilevel spondylosis with postoperative change of decompression C4-C6 identified. Bilateral pleural effusions appear small but are incompletely seen. Extensive atherosclerosis.  10-12-15: chest x-ray: 1. Mild central vascular congestion with bilateral interstitial thickening. Findings are similar to prior exams. No convincing pulmonary edema/congestive heart failure. No evidence of pneumonia.  10-12-15: right knee x-ray: No acute bony or joint abnormality is seen. Degenerative change is present about the knee. No joint effusion.  Atherosclerosis noted.  10-12-15: right wrist x-ray: Moderately displaced and comminuted distal right radial fracture is noted.  10-12-15: right hip and pelvic x-ray: Displaced fracture of the right femoral neck.     LABS REVIEWED:   04-26-15: wbc 9.4; hgb 10.8; hct 32.4; mcv 92.0; plt 108; glucose 188; bun 29; creat 1.93; k+ 3.8; na++142; liver normal albumin 3.6; chol 98; ldl 39; trig 179; hdl 23; hgb a1c 6.8 urine micro-albumin: 55.8 05-08-15: hgb a1c 6.9  06-14-15; sed rate 20; CRP <0.5 08-30-15: hgb a1c 6.2  10-02-15: wbc 11.4; hgb 10.9; hct 33.1; mcv 93.2 ;plt 170 10-12-15: wbc 27.0; hgb 11.8; hct 34.9; mcv 92.6; plt 155; glucose 286; bun 35; create 2.50; k+ 4.2; na++ 134; mag 1.9  10-27-15: glucose 195; bun 36; creat 1.81; k+ 4.2; na++ 142  11-29-15: chol 106; ldl 58; trig 108; hdl 26; hgb a1c 5.9  01-01-16: glucose 85; bun 38.8; creat 2.17; k+ 4.3; na++ 147 01-05-16: vit B 12: 358     Review of Systems Constitutional: Negative for appetite change and fatigue.  HENT: no congestion  Respiratory: Negative for cough, chest tightness and shortness of breath.   Cardiovascular: Negative for chest pain, palpitations and leg swelling.  Gastrointestinal: Negative for nausea, abdominal pain, diarrhea and constipation.  Musculoskeletal: Negative for myalgias and arthralgias.  Skin: no complaints  Neurological: Negative for dizziness.  Psychiatric/Behavioral: The patient is not nervous/anxious.       Physical Exam Constitutional: No distress.  Overweight   Eyes:  Conjunctivae are normal.  Neck: Neck supple. No JVD present. No thyromegaly present.  Cardiovascular: Normal rate, regular rhythm and intact distal pulses.   Respiratory: Effort normal and breath sounds normal. No respiratory distress. He has no wheezes.  GI: Soft. Bowel sounds are normal. He exhibits no distension. There is no tenderness.  Musculoskeletal: He exhibits no edema.  Able to move all extremities   Lymphadenopathy:      He has no cervical adenopathy.  Neurological: He is alert.  Skin: warm and dray  Psychiatric: He has a normal mood and affect.       ASSESSMENT/ PLAN:   1. Hypertension: will continue lopressor 50 mg twice daily; norvasc 10 mg daily asa 81 mg daily;  lisinopril  10 mg daily  clonidine 0.1 mg in the AM and 0.2 in the PM will monitor   2. Chronic diastolic heart failure: will continue lasix 40  mg twice daily lopressor 50 mg twice daily imdur 60 mg daily  2011: EF 60%   3. Constipation: will continue miralax twice daily; senna 2 tabs twice daily colace twice daily   4. Hypokalemia will continue  k+ 20 meq twice daily k+ is 4.2  5. GERD: will continue pepcid 20 mg twice daily    6. Dyslipidemia: will continue tricor 145 mg daily; will stop   lipitor 10 mg daily; ldl is 58; trig is 108  7. Diabetes:  hgb a1c is 5.9 ; will continue  novolog 8 units with meals  for cbg >=150  And will lower lantus to 25 units   8. Depression with anxiety will continue wellbutrin  75 mg twice daily; ativan 0.5 mg daily prior to AM care   for anxiety and will monitor  9. Vascular dementia: is without change; will continue namenda xr 14 mg daily and will monitor   10. Chronic pain: his pain is presently being managed will continue oxycontin 30 mg twice daily; neurontin 100 mg twice daily; lidoderm patch to his right neck daily and left hip; voltaren gel 2 gm to right wrist three times daily    12. Glaucoma; will continue latanoprost to both eyes nightly and cosopt to both eyes twice daily .   13. Anemia: his hgb is 11.8; will monitor   14. Diabetic peripheral neuropathy: will continue neurontin 100 mg twice daily   14. Right radial fracture and right femoral neck fracture: will follow up with orthopedics as directed; at this time; he is not having pain; will not make changes in his pain regimen and will monitor his status.   15: CKD stage IV: bun/creat 36/1.81; will monitor    MD is aware of  resident's narcotic use and is in agreement with current plan of care. We will attempt to wean resident as apropriate   Synthia Innocent NP [facility]  Contact [phone] Monday through Friday 8am- 5pm  After hours call [phone]

## 2016-02-05 ENCOUNTER — Other Ambulatory Visit: Payer: Self-pay | Admitting: *Deleted

## 2016-02-05 ENCOUNTER — Non-Acute Institutional Stay (SKILLED_NURSING_FACILITY): Payer: Medicare Other | Admitting: Adult Health

## 2016-02-05 ENCOUNTER — Encounter: Payer: Self-pay | Admitting: Adult Health

## 2016-02-05 DIAGNOSIS — N184 Chronic kidney disease, stage 4 (severe): Secondary | ICD-10-CM

## 2016-02-05 NOTE — Progress Notes (Signed)
[name], male   DOB: 03-10-1929, 82 y.o.   MRN: [id number]   Location:   [facility] Room Number: 115-A Place of Service:  SNF ([id number])   CODE STATUS:DNR  No Known Allergies  Chief Complaint  Patient presents with  . Acute Visit    Follow up lab    HPI:  His renal labs demonstrate a worsening renal function. He does tell me that he is feeling good. His fluid intake has lessened. There are no reports of fever present.    Past Medical History:  Diagnosis Date  . 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 Anxiety   . At high risk for falls   . Chronic diastolic congestive heart failure (HCC) 06/13/2012   Echo in 2011 with grade 1 diastolic dysfunction and normal systolic function with EF 60%.     . DDD (degenerative disc disease), lumbosacral   . Depression   . Depression with anxiety 11/27/2013  . Diabetic peripheral neuropathy (HCC)   . Dyslipidemia   . GERD (gastroesophageal reflux disease)   . History of colon polyps   . History of diabetic ulcer of foot   . History of MRSA infection    Hx chronic diabetic ulcer's secondary MRSA  . Hypertension   . Insulin dependent type 2 diabetes mellitus (HCC)   . Peripheral vascular disease (HCC)   . Portal hypertension (HCC) 01/15/2010   Qualifier: Diagnosis of  By: Everardo All MD, Cleophas Dunker   . Renal insufficiency   . Ulcer of right ankle (HCC)   . UNSPECIFIED PERIPHERAL VASCULAR DISEASE 01/15/2010   Qualifier: Diagnosis of  By: Everardo All MD, Cleophas Dunker   . Wheelchair dependent     Past Surgical History:  Procedure Laterality Date  . COLONOSCOPY WITH ESOPHAGOGASTRODUODENOSCOPY (EGD)  09-21-2002  . INCISION AND DRAINAGE OF WOUND Right 01/10/2014   Procedure: IRRIGATION AND DEBRIDEMENT RIGHT ANKLE WOUND WITH PLACEMENT OF ACELL;  Surgeon: Wayland Denis, DO;  Location: [facility];  Service: Plastics;  Laterality: Right;  . INGUINAL HERNIA REPAIR Left 11-30-2001  . LAPAROSCOPIC CHOLECYSTECTOMY    .  TOE AMPUTATION    . TRANSTHORACIC ECHOCARDIOGRAM  12-20-2009   severe LVH/  ef 60%/  grade I diastolic dysfunction/  AV sclerosis without stenosis/  mild LAE and RAE/      Social History   Social History  . Marital status: Married    Spouse name: N/A  . Number of children: N/A  . Years of education: N/A   Occupational History  .  Retired    Retired   Social History Main Topics  . Smoking status: Former Smoker    Types: Cigarettes  . Smokeless tobacco: Never Used  . Alcohol use No     Comment: hx alcohol abuse  in remission  . Drug use: No  . Sexual activity: No   Other Topics Concern  . Not on file   Social History Narrative   L[facility] at [facility]   Family History  Problem Relation Age of Onset  . Diabetes Neg Hx     No DM in immediate family      VITAL SIGNS BP 109/67   Pulse (!) 59   Temp 98.8 F (37.1 C) (Oral)   Resp 18   Ht 5\' 10"  (1.778 m)   Wt 220 lb (99.8 kg)   SpO2 98%   BMI 31.57 kg/m   Patient's Medications  New Prescriptions   No medications on  file  Previous Medications   ACETAMINOPHEN (TYLENOL) 325 MG TABLET    Take 2 tablets (650 mg total) by mouth every 6 (six) hours as needed for mild pain.   AMLODIPINE (NORVASC) 5 MG TABLET    Take 10 mg by mouth every morning.    ASCORBIC ACID (VITAMIN C) 500 MG TABLET    Take 500 mg by mouth 2 (two) times daily.   ASPIRIN 81 MG TABLET    Take 81 mg by mouth every morning.    BUPROPION (WELLBUTRIN) 75 MG TABLET    Take 75 mg by mouth 2 (two) times daily.    CLONIDINE (CATAPRES) 0.1 MG TABLET    Give 0.2 mg by mouth every morning (0800) and 0.1 mg by mouth every evening (1800)   COAL TAR (NEUTROGENA T-GEL) 0.5 % SHAMPOO    Apply topically. Apply topically every 96 hours for dandruff weekly as needed   DICLOFENAC SODIUM (VOLTAREN) 1 % GEL    Apply 2 g topically 3 (three) times daily. To right wrist   DOCUSATE SODIUM (COLACE) 100 MG CAPSULE    Take 1 capsule (100 mg total) by mouth 2 (two) times  daily.   DORZOLAMIDE-TIMOLOL (COSOPT) 22.3-6.8 MG/ML OPHTHALMIC SOLUTION    Place 1 drop into both eyes 2 (two) times daily.   FAMOTIDINE (PEPCID) 20 MG TABLET    Take 20 mg by mouth 2 (two) times daily.   FENOFIBRATE (TRICOR) 145 MG TABLET    Take 145 mg by mouth every evening.    FLUTICASONE (FLONASE) 50 MCG/ACT NASAL SPRAY    Place 2 sprays into both nostrils daily.   FUROSEMIDE (LASIX) 40 MG TABLET    Take 40 mg by mouth 2 (two) times daily.   GABAPENTIN (NEURONTIN) 100 MG CAPSULE    Take 100 mg by mouth 2 (two) times daily.   INSULIN ASPART (NOVOLOG) 100 UNIT/ML INJECTION    Inject 5 Units into the skin 3 (three) times daily before meals. Inject as per sliding scale: if  0-150 =0 units; 151-450 = 8 units, subcutaneously before meals related to DM. Call MD for BS> 450   INSULIN GLARGINE (LANTUS) 100 UNIT/ML INJECTION    Inject 25 Units into the skin at bedtime.    ISOSORBIDE MONONITRATE (IMDUR) 60 MG 24 HR TABLET    Take 60 mg by mouth every morning.    LATANOPROST (XALATAN) 0.005 % OPHTHALMIC SOLUTION    Place 1 drop into both eyes at bedtime.     LIDOCAINE (LIDODERM) 5 %    Place 1 patch onto the skin every 12 (twelve) hours. Remove & Discard patch within 12 hours to right neck  And to left hip   LISINOPRIL (PRINIVIL,ZESTRIL) 10 MG TABLET    Take 10 mg by mouth daily.    LORAZEPAM (ATIVAN) 0.5 MG TABLET    Take one tablet by mouth every morning   MEMANTINE HCL ER (NAMENDA XR) 14 MG CP24    Take 1 tablet by mouth every morning.   METOPROLOL (LOPRESSOR) 50 MG TABLET    Take 50 mg by mouth 2 (two) times daily.   NUTRITIONAL SUPPLEMENTS (NUTRITIONAL SUPPLEMENT PO)    Take by mouth. HSG Mech Soft Texture, for diet   NYSTATIN (MYCOSTATIN/NYSTOP) POWDER    Apply topically 2 (two) times daily.   ONDANSETRON (ZOFRAN) 4 MG TABLET    Take 4 mg by mouth every 8 (eight) hours as needed for nausea or vomiting.   OXYCODONE (OXYCONTIN) 30 MG 12 HR  TABLET    Take one tablet by mouth every 12 hours for pain  NO NOT CUT/CRUSH/CHEW   POLYETHYLENE GLYCOL POWDER (GLYCOLAX/MIRALAX) POWDER    Take 17 g by mouth 2 (two) times daily.    POTASSIUM CHLORIDE (KLOR-CON) 20 MEQ PACKET    Take 20 mEq by mouth 2 (two) times daily.   PROTEIN SUPPLEMENT (PROMOD) POWD    Take 1 scoop by mouth 2 (two) times daily.   SENNA (SENOKOT) 8.6 MG TABLET    Take 2 tablets by mouth 2 (two) times daily.   Modified Medications   No medications on file  Discontinued Medications   No medications on file     SIGNIFICANT DIAGNOSTIC EXAMS  03-14-14: right ankle tib/fib mri: 1. 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 2. T2 hyperintense probable draining abscess deep to ulceration. 3. Ankle effusion. This is probably reactive. Septic arthritis is less likely. 4. Infectious tenosynovitis of the peroneal tendons. 5. No abscess in the proximal RIGHT leg.  10-02-15: chest x-ray: no acute cardiopulmonary process   10-12-15: ct of head and cervical spine: No acute abnormality head or cervical spine. Advanced multilevel spondylosis with postoperative change of decompression C4-C6 identified. Bilateral pleural effusions appear small but are incompletely seen. Extensive atherosclerosis.  10-12-15: chest x-ray: 1. Mild central vascular congestion with bilateral interstitial thickening. Findings are similar to prior exams. No convincing pulmonary edema/congestive heart failure. No evidence of pneumonia.  10-12-15: right knee x-ray: No acute bony or joint abnormality is seen. Degenerative change is present about the knee. No joint effusion. Atherosclerosis noted.  10-12-15: right wrist x-ray: Moderately displaced and comminuted distal right radial fracture is noted.  10-12-15: right hip and pelvic x-ray: Displaced fracture of the right femoral neck.  02-01-16: chest x-ray; no acute cardiopulmonary disease.    LABS REVIEWED:   04-26-15: wbc 9.4; hgb 10.8; hct 32.4; mcv 92.0; plt 108;  glucose 188; bun 29; creat 1.93; k+ 3.8; na++142; liver normal albumin 3.6; chol 98; ldl 39; trig 179; hdl 23; hgb a1c 6.8 urine micro-albumin: 55.8 05-08-15: hgb a1c 6.9  06-14-15; sed rate 20; CRP <0.5 08-30-15: hgb a1c 6.2  10-02-15: wbc 11.4; hgb 10.9; hct 33.1; mcv 93.2 ;plt 170 10-12-15: wbc 27.0; hgb 11.8; hct 34.9; mcv 92.6; plt 155; glucose 286; bun 35; create 2.50; k+ 4.2; na++ 134; mag 1.9  10-27-15: glucose 195; bun 36; creat 1.81; k+ 4.2; na++ 142  11-29-15: chol 106; ldl 58; trig 108; hdl 26; hgb a1c 5.9  01-05-16: vit B 12: 358 02-02-16: wbc 9.6; hgb 11.9; hct 35.9; mcv 95.3; plt 123; glucose 101; bun 60.2; creat 2.56; k+ 3.9; na++ 152; liver normal albumin 4.1     Review of Systems Constitutional: Negative for appetite change and fatigue.  HENT:no congestion  Respiratory: Negative for cough, chest tightness and shortness of breath.   Cardiovascular: Negative for chest pain, palpitations and leg swelling.  Gastrointestinal: Negative for nausea, abdominal pain, diarrhea and constipation.  Musculoskeletal: Negative for myalgias and arthralgias.  Skin: no complaints  Neurological: Negative for dizziness.  Psychiatric/Behavioral: The patient is not nervous/anxious.       Physical Exam Constitutional: No distress.  Overweight   Eyes: Conjunctivae are normal.  Neck: Neck supple. No JVD present. No thyromegaly present.  Cardiovascular: Normal rate, regular rhythm and intact distal pulses.   Respiratory: Effort normal and breath sounds normal. No respiratory distress. He has no wheezes.  GI: Soft. Bowel sounds are normal. He exhibits no  distension. There is no tenderness.  Musculoskeletal: He exhibits no edema.  Able to move all extremities   Lymphadenopathy:    He has no cervical adenopathy.  Neurological: He is alert.  Skin: warm and dray  Psychiatric: He has a normal mood and affect.       ASSESSMENT/ PLAN:  1. Chronic kidney disease stage IV: will begin 1/2 NS at 100  cc per hour for 2 liters will then repeat bmp and will monitor his status.    MD is aware of resident's narcotic use and is in agreement with current plan of care. We will attempt to wean resident as apropriate   Synthia Innocenteborah Green NP [facility]  Contact [phone] Monday through Friday 8am- 5pm  After hours call [phone]

## 2016-02-07 LAB — BASIC METABOLIC PANEL
BUN: 45 mg/dL — AB (ref 4–21)
Creatinine: 2.1 mg/dL — AB (ref 0.6–1.3)
Glucose: 106 mg/dL
POTASSIUM: 3.6 mmol/L (ref 3.4–5.3)
SODIUM: 143 mmol/L (ref 137–147)

## 2016-02-22 ENCOUNTER — Encounter: Payer: Self-pay | Admitting: Adult Health

## 2016-02-22 ENCOUNTER — Non-Acute Institutional Stay (SKILLED_NURSING_FACILITY): Payer: Medicare Other | Admitting: Adult Health

## 2016-02-22 DIAGNOSIS — N179 Acute kidney failure, unspecified: Secondary | ICD-10-CM

## 2016-02-22 DIAGNOSIS — N183 Chronic kidney disease, stage 3 (moderate): Secondary | ICD-10-CM

## 2016-02-22 DIAGNOSIS — N3000 Acute cystitis without hematuria: Secondary | ICD-10-CM

## 2016-02-22 LAB — BASIC METABOLIC PANEL
BUN: 56 mg/dL — AB (ref 4–21)
CREATININE: 2.7 mg/dL — AB (ref 0.6–1.3)
Glucose: 144 mg/dL
POTASSIUM: 4.9 mmol/L (ref 3.4–5.3)
SODIUM: 146 mmol/L (ref 137–147)

## 2016-02-22 LAB — HEPATIC FUNCTION PANEL
ALK PHOS: 64 U/L (ref 25–125)
ALT: 5 U/L — AB (ref 10–40)
AST: 12 U/L — AB (ref 14–40)
Bilirubin, Total: 0.7 mg/dL

## 2016-02-22 LAB — CBC AND DIFFERENTIAL
HEMATOCRIT: 34 % — AB (ref 41–53)
Hemoglobin: 11.5 g/dL — AB (ref 13.5–17.5)
Platelets: 157 10*3/uL (ref 150–399)
WBC: 11.9 10^3/mL

## 2016-02-22 NOTE — Progress Notes (Signed)
Location:   Database administrator of Service:  SNF (31)   CODE STATUS: dnr  No Known Allergies  Chief Complaint  Patient presents with  . Acute Visit    change in status     HPI:  He is not verbally responsive. He has not eaten today. He does have abdominal tenderness present.  There is concerns that he is developing an infection and the staff is concerned about dehydration. There are no reports of fever present.   Past Medical History:  Diagnosis Date  . 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 Anxiety   . At high risk for falls   . Chronic diastolic congestive heart failure (HCC) 06/13/2012   Echo in 2011 with grade 1 diastolic dysfunction and normal systolic function with EF 60%.     . DDD (degenerative disc disease), lumbosacral   . Depression   . Depression with anxiety 11/27/2013  . Diabetic peripheral neuropathy (HCC)   . Dyslipidemia   . GERD (gastroesophageal reflux disease)   . History of colon polyps   . History of diabetic ulcer of foot   . History of MRSA infection    Hx chronic diabetic ulcer's secondary MRSA  . Hypertension   . Insulin dependent type 2 diabetes mellitus (HCC)   . Peripheral vascular disease (HCC)   . Portal hypertension (HCC) 01/15/2010   Qualifier: Diagnosis of  By: Everardo All MD, Cleophas Dunker   . Renal insufficiency   . Ulcer of right ankle (HCC)   . UNSPECIFIED PERIPHERAL VASCULAR DISEASE 01/15/2010   Qualifier: Diagnosis of  By: Everardo All MD, Cleophas Dunker   . Wheelchair dependent     Past Surgical History:  Procedure Laterality Date  . COLONOSCOPY WITH ESOPHAGOGASTRODUODENOSCOPY (EGD)  09-21-2002  . INCISION AND DRAINAGE OF WOUND Right 01/10/2014   Procedure: IRRIGATION AND DEBRIDEMENT RIGHT ANKLE WOUND WITH PLACEMENT OF ACELL;  Surgeon: Wayland Denis, DO;  Location: [facility];  Service: Plastics;  Laterality: Right;  . INGUINAL HERNIA REPAIR Left 11-30-2001  . LAPAROSCOPIC CHOLECYSTECTOMY    . TOE AMPUTATION    .  TRANSTHORACIC ECHOCARDIOGRAM  12-20-2009   severe LVH/  ef 60%/  grade I diastolic dysfunction/  AV sclerosis without stenosis/  mild LAE and RAE/      Social History   Social History  . Marital status: Married    Spouse name: N/A  . Number of children: N/A  . Years of education: N/A   Occupational History  .  Retired    Retired   Social History Main Topics  . Smoking status: Former Smoker    Types: Cigarettes  . Smokeless tobacco: Never Used  . Alcohol use No     Comment: hx alcohol abuse  in remission  . Drug use: No  . Sexual activity: No   Other Topics Concern  . Not on file   Social History Narrative   Lives at [facility]   Family History  Problem Relation Age of Onset  . Diabetes Neg Hx     No DM in immediate family      VITAL SIGNS BP 136/76   Pulse 64   Temp 97 F (36.1 C)   Resp 18   Ht 5\' 10"  (1.778 m)   Wt 220 lb (99.8 kg)   SpO2 98%   BMI 31.57 kg/m   Patient's Medications  New Prescriptions   No medications on file  Previous Medications   ACETAMINOPHEN (TYLENOL) 325  MG TABLET    Take 2 tablets (650 mg total) by mouth every 6 (six) hours as needed for mild pain.   AMLODIPINE (NORVASC) 5 MG TABLET    Take 10 mg by mouth every morning.    ASCORBIC ACID (VITAMIN C) 500 MG TABLET    Take 500 mg by mouth 2 (two) times daily.   ASPIRIN 81 MG TABLET    Take 81 mg by mouth every morning.    BUPROPION (WELLBUTRIN) 75 MG TABLET    Take 75 mg by mouth 2 (two) times daily.    CLONIDINE (CATAPRES) 0.1 MG TABLET    Give 0.2 mg by mouth every morning (0800) and 0.1 mg by mouth every evening (1800)   COAL TAR (NEUTROGENA T-GEL) 0.5 % SHAMPOO    Apply topically. Apply topically every 96 hours for dandruff weekly as needed   DICLOFENAC SODIUM (VOLTAREN) 1 % GEL    Apply 2 g topically 3 (three) times daily. To right wrist   DOCUSATE SODIUM (COLACE) 100 MG CAPSULE    Take 1 capsule (100 mg total) by mouth 2 (two) times daily.   DORZOLAMIDE-TIMOLOL  (COSOPT) 22.3-6.8 MG/ML OPHTHALMIC SOLUTION    Place 1 drop into both eyes 2 (two) times daily.   FAMOTIDINE (PEPCID) 20 MG TABLET    Take 20 mg by mouth 2 (two) times daily.   FENOFIBRATE (TRICOR) 145 MG TABLET    Take 145 mg by mouth every evening.    FLUTICASONE (FLONASE) 50 MCG/ACT NASAL SPRAY    Place 2 sprays into both nostrils daily.   FUROSEMIDE (LASIX) 40 MG TABLET    Take 40 mg by mouth 2 (two) times daily.   GABAPENTIN (NEURONTIN) 100 MG CAPSULE    Take 100 mg by mouth 2 (two) times daily.   INSULIN ASPART (NOVOLOG) 100 UNIT/ML INJECTION    Inject 5 Units into the skin 3 (three) times daily before meals. Inject as per sliding scale: if  0-150 =0 units; 151-450 = 8 units, subcutaneously before meals related to DM. Call MD for BS> 450   INSULIN GLARGINE (LANTUS) 100 UNIT/ML INJECTION    Inject 25 Units into the skin at bedtime.    ISOSORBIDE MONONITRATE (IMDUR) 60 MG 24 HR TABLET    Take 60 mg by mouth every morning.    LATANOPROST (XALATAN) 0.005 % OPHTHALMIC SOLUTION    Place 1 drop into both eyes at bedtime.     LIDOCAINE (LIDODERM) 5 %    Place 1 patch onto the skin every 12 (twelve) hours. Remove & Discard patch within 12 hours to right neck  And to left hip   LISINOPRIL (PRINIVIL,ZESTRIL) 10 MG TABLET    Take 10 mg by mouth daily.    LORAZEPAM (ATIVAN) 0.5 MG TABLET    Take one tablet by mouth every morning   MEMANTINE HCL ER (NAMENDA XR) 14 MG CP24    Take 1 tablet by mouth every morning.   METOPROLOL (LOPRESSOR) 50 MG TABLET    Take 50 mg by mouth 2 (two) times daily.   NUTRITIONAL SUPPLEMENTS (NUTRITIONAL SUPPLEMENT PO)    Take by mouth. HSG Mech Soft Texture, for diet   NYSTATIN (MYCOSTATIN/NYSTOP) POWDER    Apply topically 2 (two) times daily.   ONDANSETRON (ZOFRAN) 4 MG TABLET    Take 4 mg by mouth every 8 (eight) hours as needed for nausea or vomiting.   OXYCODONE (OXYCONTIN) 30 MG 12 HR TABLET    Take one tablet by mouth  every 12 hours for pain NO NOT CUT/CRUSH/CHEW    POLYETHYLENE GLYCOL POWDER (GLYCOLAX/MIRALAX) POWDER    Take 17 g by mouth 2 (two) times daily.    POTASSIUM CHLORIDE (KLOR-CON) 20 MEQ PACKET    Take 20 mEq by mouth 2 (two) times daily.   PROTEIN SUPPLEMENT (PROMOD) POWD    Take 1 scoop by mouth 2 (two) times daily.   SENNA (SENOKOT) 8.6 MG TABLET    Take 2 tablets by mouth 2 (two) times daily.   Modified Medications   No medications on file  Discontinued Medications   No medications on file     SIGNIFICANT DIAGNOSTIC EXAMS   03-14-14: right ankle tib/fib mri: 1. 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 2. T2 hyperintense probable draining abscess deep to ulceration. 3. Ankle effusion. This is probably reactive. Septic arthritis is less likely. 4. Infectious tenosynovitis of the peroneal tendons. 5. No abscess in the proximal RIGHT leg.  10-02-15: chest x-ray: no acute cardiopulmonary process   10-12-15: ct of head and cervical spine: No acute abnormality head or cervical spine. Advanced multilevel spondylosis with postoperative change of decompression C4-C6 identified. Bilateral pleural effusions appear small but are incompletely seen. Extensive atherosclerosis.  10-12-15: chest x-ray: 1. Mild central vascular congestion with bilateral interstitial thickening. Findings are similar to prior exams. No convincing pulmonary edema/congestive heart failure. No evidence of pneumonia.  10-12-15: right knee x-ray: No acute bony or joint abnormality is seen. Degenerative change is present about the knee. No joint effusion. Atherosclerosis noted.  10-12-15: right wrist x-ray: Moderately displaced and comminuted distal right radial fracture is noted.  10-12-15: right hip and pelvic x-ray: Displaced fracture of the right femoral neck.  02-01-16: chest x-ray; no acute cardiopulmonary disease.    LABS REVIEWED:   04-26-15: wbc 9.4; hgb 10.8; hct 32.4; mcv 92.0; plt 108; glucose 188; bun 29;  creat 1.93; k+ 3.8; na++142; liver normal albumin 3.6; chol 98; ldl 39; trig 179; hdl 23; hgb a1c 6.8 urine micro-albumin: 55.8 05-08-15: hgb a1c 6.9  06-14-15; sed rate 20; CRP <0.5 08-30-15: hgb a1c 6.2  10-02-15: wbc 11.4; hgb 10.9; hct 33.1; mcv 93.2 ;plt 170 10-12-15: wbc 27.0; hgb 11.8; hct 34.9; mcv 92.6; plt 155; glucose 286; bun 35; create 2.50; k+ 4.2; na++ 134; mag 1.9  10-27-15: glucose 195; bun 36; creat 1.81; k+ 4.2; na++ 142  11-29-15: chol 106; ldl 58; trig 108; hdl 26; hgb a1c 5.9  01-05-16: vit B 12: 358 02-02-16: wbc 9.6; hgb 11.9; hct 35.9; mcv 95.3; plt 123; glucose 101; bun 60.2; creat 2.56; k+ 3.9; na++ 152; liver normal albumin 4.1  02-07-16: glucose 106; bun 45.2; creat 2.06; k+ 3.6; na++ 143    Review of Systems  Unable to perform ROS: Medical condition      Physical Exam Constitutional: No distress.  Overweight   Eyes: Conjunctivae are normal.  Neck: Neck supple. No JVD present. No thyromegaly present.  Cardiovascular: Normal rate, regular rhythm and intact distal pulses.   Respiratory: Effort normal and breath sounds normal. No respiratory distress. He has no wheezes.  GI: Soft. Bowel sounds are normal. He exhibits no distension. There is no tenderness.  Musculoskeletal: He exhibits no edema.  Able to move all extremities   Lymphadenopathy:    He has no cervical adenopathy.  Neurological: He is alert.  Skin: warm and dray  Psychiatric: He has a normal mood and affect.   ASSESSMENT/ PLAN:  1. Acute renal failure  2. Possible uti  will begin NS at 75 cc per hour for 2 liters Will get cbc cmp; us/c&s Will continue to monitor his status.   MD is aware of resident's narcotic use and is in agreement with current plan of care. We will attempt to wean resident as apropriate   Synthia Innocenteborah Green NP [facility]  Contact [phone] Monday through Friday 8am- 5pm  After hours call [phone]

## 2016-02-28 LAB — BASIC METABOLIC PANEL
BUN: 35 mg/dL — AB (ref 4–21)
CREATININE: 1.6 mg/dL — AB (ref 0.6–1.3)
Glucose: 89 mg/dL
POTASSIUM: 3.8 mmol/L (ref 3.4–5.3)
Sodium: 142 mmol/L (ref 137–147)

## 2016-02-28 LAB — HEPATIC FUNCTION PANEL
ALK PHOS: 57 U/L (ref 25–125)
AST: 14 U/L (ref 14–40)
Bilirubin, Total: 0.7 mg/dL

## 2016-02-28 LAB — CBC AND DIFFERENTIAL
HCT: 34 % — AB (ref 41–53)
Hemoglobin: 11.7 g/dL — AB (ref 13.5–17.5)
PLATELETS: 143 10*3/uL — AB (ref 150–399)
WBC: 9.5 10^3/mL

## 2016-02-29 ENCOUNTER — Non-Acute Institutional Stay (SKILLED_NURSING_FACILITY): Payer: Medicare Other | Admitting: Adult Health

## 2016-02-29 ENCOUNTER — Encounter: Payer: Self-pay | Admitting: Adult Health

## 2016-02-29 DIAGNOSIS — I5032 Chronic diastolic (congestive) heart failure: Secondary | ICD-10-CM | POA: Diagnosis not present

## 2016-02-29 DIAGNOSIS — E1142 Type 2 diabetes mellitus with diabetic polyneuropathy: Secondary | ICD-10-CM | POA: Diagnosis not present

## 2016-02-29 DIAGNOSIS — N184 Chronic kidney disease, stage 4 (severe): Secondary | ICD-10-CM | POA: Diagnosis not present

## 2016-02-29 DIAGNOSIS — K5903 Drug induced constipation: Secondary | ICD-10-CM

## 2016-02-29 DIAGNOSIS — E785 Hyperlipidemia, unspecified: Secondary | ICD-10-CM

## 2016-02-29 DIAGNOSIS — E1149 Type 2 diabetes mellitus with other diabetic neurological complication: Secondary | ICD-10-CM | POA: Diagnosis not present

## 2016-02-29 DIAGNOSIS — E1169 Type 2 diabetes mellitus with other specified complication: Secondary | ICD-10-CM | POA: Diagnosis not present

## 2016-02-29 DIAGNOSIS — G894 Chronic pain syndrome: Secondary | ICD-10-CM | POA: Diagnosis not present

## 2016-02-29 DIAGNOSIS — F0150 Vascular dementia without behavioral disturbance: Secondary | ICD-10-CM

## 2016-02-29 DIAGNOSIS — I110 Hypertensive heart disease with heart failure: Secondary | ICD-10-CM

## 2016-02-29 NOTE — Progress Notes (Signed)
Location:   [facility] Room Number: 115 A Place of Service:  SNF (31)   CODE STATUS: DNR  No Known Allergies  Chief Complaint  Patient presents with  . Medical Management of Chronic Issues   HPI:  He is a long term resident of this facility being seen for the management of his chronic illnesses. Overall his status is without significant change. He has required IVF over the past month. His renal function has remained. He does have periods of time where he is less responsive. He does spend nearly all of his time in bed per his choice.     Past Medical History:  Diagnosis Date  . 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 Anxiety   . At high risk for falls   . Chronic diastolic congestive heart failure (HCC) 06/13/2012   Echo in 2011 with grade 1 diastolic dysfunction and normal systolic function with EF 60%.     . DDD (degenerative disc disease), lumbosacral   . Depression   . Depression with anxiety 11/27/2013  . Diabetic peripheral neuropathy (HCC)   . Dyslipidemia   . GERD (gastroesophageal reflux disease)   . History of colon polyps   . History of diabetic ulcer of foot   . History of MRSA infection    Hx chronic diabetic ulcer's secondary MRSA  . Hypertension   . Insulin dependent type 2 diabetes mellitus (HCC)   . Peripheral vascular disease (HCC)   . Portal hypertension (HCC) 01/15/2010   Qualifier: Diagnosis of  By: Everardo All MD, Cleophas Dunker   . Renal insufficiency   . Ulcer of right ankle (HCC)   . UNSPECIFIED PERIPHERAL VASCULAR DISEASE 01/15/2010   Qualifier: Diagnosis of  By: Everardo All MD, Cleophas Dunker   . Wheelchair dependent     Past Surgical History:  Procedure Laterality Date  . COLONOSCOPY WITH ESOPHAGOGASTRODUODENOSCOPY (EGD)  09-21-2002  . INCISION AND DRAINAGE OF WOUND Right 01/10/2014   Procedure: IRRIGATION AND DEBRIDEMENT RIGHT ANKLE WOUND WITH PLACEMENT OF ACELL;  Surgeon: Wayland Denis, DO;  Location: [facility];  Service:  Plastics;  Laterality: Right;  . INGUINAL HERNIA REPAIR Left 11-30-2001  . LAPAROSCOPIC CHOLECYSTECTOMY    . TOE AMPUTATION    . TRANSTHORACIC ECHOCARDIOGRAM  12-20-2009   severe LVH/  ef 60%/  grade I diastolic dysfunction/  AV sclerosis without stenosis/  mild LAE and RAE/      Social History   Social History  . Marital status: Married    Spouse name: N/A  . Number of children: N/A  . Years of education: N/A   Occupational History  .  Retired    Retired   Social History Main Topics  . Smoking status: Former Smoker    Types: Cigarettes  . Smokeless tobacco: Never Used  . Alcohol use No     Comment: hx alcohol abuse  in remission  . Drug use: No  . Sexual activity: No   Other Topics Concern  . Not on file   Social History Narrative   Lives at [facility]   Family History  Problem Relation Age of Onset  . Diabetes Neg Hx     No DM in immediate family      VITAL SIGNS BP (!) 142/75   Pulse 63   Temp 99.1 F (37.3 C)   Resp 18   Ht 5\' 10"  (1.778 m)   Wt 220 lb (99.8 kg)   SpO2 98%   BMI 31.57  kg/m   Patient's Medications  New Prescriptions   No medications on file  Previous Medications   ACETAMINOPHEN (TYLENOL) 325 MG TABLET    Take 2 tablets (650 mg total) by mouth every 6 (six) hours as needed for mild pain.   AMLODIPINE (NORVASC) 5 MG TABLET    Take 10 mg by mouth every morning.    ASCORBIC ACID (VITAMIN C) 500 MG TABLET    Take 500 mg by mouth 2 (two) times daily.   ASPIRIN 81 MG TABLET    Take 81 mg by mouth every morning.    BUPROPION (WELLBUTRIN) 75 MG TABLET    Take 75 mg by mouth 2 (two) times daily.    CLONIDINE (CATAPRES) 0.1 MG TABLET    Give 0.2 mg by mouth every morning (0800) and 0.1 mg by mouth every evening (1800)   COAL TAR (NEUTROGENA T-GEL) 0.5 % SHAMPOO    Apply topically. Apply topically every 96 hours for dandruff weekly as needed   DICLOFENAC SODIUM (VOLTAREN) 1 % GEL    Apply 2 g topically 3 (three) times daily. To right  wrist   DOCUSATE SODIUM (COLACE) 100 MG CAPSULE    Take 1 capsule (100 mg total) by mouth 2 (two) times daily.   DORZOLAMIDE-TIMOLOL (COSOPT) 22.3-6.8 MG/ML OPHTHALMIC SOLUTION    Place 1 drop into both eyes 2 (two) times daily.   FAMOTIDINE (PEPCID) 20 MG TABLET    Take 20 mg by mouth 2 (two) times daily.   FENOFIBRATE (TRICOR) 145 MG TABLET    Take 145 mg by mouth every evening.    FLUTICASONE (FLONASE) 50 MCG/ACT NASAL SPRAY    Place 2 sprays into both nostrils daily.   FUROSEMIDE (LASIX) 40 MG TABLET    Take 40 mg by mouth 2 (two) times daily.   GABAPENTIN (NEURONTIN) 100 MG CAPSULE    Take 100 mg by mouth 2 (two) times daily.   INSULIN ASPART (NOVOLOG) 100 UNIT/ML INJECTION    Inject 5 Units into the skin 3 (three) times daily before meals. Inject as per sliding scale: if  0-150 =0 units; 151-450 = 8 units, subcutaneously before meals related to DM. Call MD for BS> 450   INSULIN GLARGINE (LANTUS) 100 UNIT/ML INJECTION    Inject 25 Units into the skin at bedtime.    ISOSORBIDE MONONITRATE (IMDUR) 60 MG 24 HR TABLET    Take 60 mg by mouth every morning.    LATANOPROST (XALATAN) 0.005 % OPHTHALMIC SOLUTION    Place 1 drop into both eyes at bedtime.     LIDOCAINE (LIDODERM) 5 %    Place 1 patch onto the skin every 12 (twelve) hours. Remove & Discard patch within 12 hours to right neck  And to left hip   LISINOPRIL (PRINIVIL,ZESTRIL) 10 MG TABLET    Take 10 mg by mouth daily.    LORAZEPAM (ATIVAN) 0.5 MG TABLET    Take one tablet by mouth every morning   MEMANTINE HCL ER (NAMENDA XR) 14 MG CP24    Take 1 tablet by mouth every morning.   METOPROLOL (LOPRESSOR) 50 MG TABLET    Take 50 mg by mouth 2 (two) times daily.   NUTRITIONAL SUPPLEMENTS (NUTRITIONAL SUPPLEMENT PO)    Take by mouth. HSG Mech Soft Texture, for diet   NYSTATIN (MYCOSTATIN/NYSTOP) POWDER    Apply topically 2 (two) times daily.   ONDANSETRON (ZOFRAN) 4 MG TABLET    Take 4 mg by mouth every 8 (eight) hours as  needed for nausea or  vomiting.   OXYCODONE (OXYCONTIN) 30 MG 12 HR TABLET    Take one tablet by mouth every 12 hours for pain NO NOT CUT/CRUSH/CHEW   POLYETHYLENE GLYCOL POWDER (GLYCOLAX/MIRALAX) POWDER    Take 17 g by mouth 2 (two) times daily.    POTASSIUM CHLORIDE (KLOR-CON) 20 MEQ PACKET    Take 20 mEq by mouth 2 (two) times daily.   PROTEIN SUPPLEMENT (PROMOD) POWD    Take 1 scoop by mouth 2 (two) times daily.   SENNA (SENOKOT) 8.6 MG TABLET    Take 2 tablets by mouth 2 (two) times daily.   Modified Medications   No medications on file  Discontinued Medications   No medications on file     SIGNIFICANT DIAGNOSTIC EXAMS   03-14-14: right ankle tib/fib mri: 1. 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 2. T2 hyperintense probable draining abscess deep to ulceration. 3. Ankle effusion. This is probably reactive. Septic arthritis is less likely. 4. Infectious tenosynovitis of the peroneal tendons. 5. No abscess in the proximal RIGHT leg.  10-12-15: ct of head and cervical spine: No acute abnormality head or cervical spine. Advanced multilevel spondylosis with postoperative change of decompression C4-C6 identified. Bilateral pleural effusions appear small but are incompletely seen. Extensive atherosclerosis.  10-12-15: right knee x-ray: No acute bony or joint abnormality is seen. Degenerative change is present about the knee. No joint effusion. Atherosclerosis noted.  10-12-15: right wrist x-ray: Moderately displaced and comminuted distal right radial fracture is noted.  10-12-15: right hip and pelvic x-ray: Displaced fracture of the right femoral neck.  02-01-16: chest x-ray; no acute cardiopulmonary disease.   02-23-16: chest x-ray: no acute cardiopulmonary changes  02-29-16: kub: findings [place] reflect fecal retention or decreased colonic motility. Ileus or obstruction not excluded definitively     LABS REVIEWED:   04-26-15: wbc 9.4; hgb 10.8; hct  32.4; mcv 92.0; plt 108; glucose 188; bun 29; creat 1.93; k+ 3.8; na++142; liver normal albumin 3.6; chol 98; ldl 39; trig 179; hdl 23; hgb a1c 6.8 urine micro-albumin: 55.8 05-08-15: hgb a1c 6.9  06-14-15; sed rate 20; CRP <0.5 08-30-15: hgb a1c 6.2  10-02-15: wbc 11.4; hgb 10.9; hct 33.1; mcv 93.2 ;plt 170 10-12-15: wbc 27.0; hgb 11.8; hct 34.9; mcv 92.6; plt 155; glucose 286; bun 35; create 2.50; k+ 4.2; na++ 134; mag 1.9  10-27-15: glucose 195; bun 36; creat 1.81; k+ 4.2; na++ 142  11-29-15: chol 106; ldl 58; trig 108; hdl 26; hgb a1c 5.9  01-05-16: vit B 12: 358 02-02-16: wbc 9.6; hgb 11.9; hct 35.9; mcv 95.3; plt 123; glucose 101; bun 60.2; creat 2.56; k+ 3.9; na++ 152; liver normal albumin 4.1  02-07-16: glucose 106; bun 45.2; creat 2.06; k+ 3.6; na++ 143  02-22-16: wbc 11.9; hgb 11.5; hct 34.4; mcv 93.7; plt 157; glucose 144; bun 55.5; creat 2.69; k+ 4.9; na++ 146; liver normal albumin 4.0     Review of Systems Constitutional: Negative for appetite change and fatigue.  HENT: no congestion  Respiratory: Negative for cough, chest tightness and shortness of breath.   Cardiovascular: Negative for chest pain, palpitations and leg swelling.  Gastrointestinal: Negative for nausea,does have abdominal pain  Musculoskeletal: Negative for myalgias and arthralgias.  Skin: no complaints  Neurological: Negative for dizziness.  Psychiatric/Behavioral: The patient is not nervous/anxious.       Physical Exam Constitutional: No distress.  Overweight   Eyes: Conjunctivae are normal.  Neck: Neck supple. No  JVD present. No thyromegaly present.  Cardiovascular: Normal rate, regular rhythm and intact distal pulses.   Respiratory: Effort normal and breath sounds normal. No respiratory distress. He has no wheezes.  GI: Soft. Bowel sounds are normal. Has tenderness in lower quads; has large amount hard stool in rectum  Musculoskeletal: He exhibits no edema.  Able to move all extremities   Lymphadenopathy:      He has no cervical adenopathy.  Neurological: He is alert.  Skin: warm and dray  Psychiatric: He has a normal mood and affect.     ASSESSMENT/ PLAN:   1. Hypertension: will continue lopressor 50 mg twice daily; norvasc 10 mg daily asa 81 mg daily;  lisinopril  10 mg daily  clonidine 0.1 mg in the AM and 0.2 in the PM will monitor   2. Chronic diastolic heart failure: will continue lasix 40  mg twice daily lopressor 50 mg twice daily imdur 60 mg daily  2011: EF 60%   3. Constipation: will continue miralax twice daily; senna 2 tabs twice daily colace twice daily   Will give him a fleets enema now and 30 cc MOM noew  4. Hypokalemia will continue  k+ 20 meq twice daily k+ is 4.2  5. GERD: will continue pepcid 20 mg twice daily    6. Dyslipidemia: will continue tricor 145 mg daily; will stop   lipitor 10 mg daily; ldl is 58; trig is 108  7. Diabetes:  hgb a1c is 5.9 ; will continue  novolog 8 units with meals  for cbg >=150  And will lower lantus to 25 units   8. Depression with anxiety will continue wellbutrin  75 mg twice daily; ativan 0.5 mg daily prior to AM care   for anxiety and will monitor  9. Vascular dementia: is without change; will continue namenda xr 14 mg daily and will monitor   10. Chronic pain: his pain is presently being managed will continue neurontin 100 mg twice daily; lidoderm patch to his right neck daily and left hip; voltaren gel 2 gm to right wrist three times daily  Will reduce oxycontin to 20 mg twice daily due to his severe constipation will monitor his pain closely   12. Glaucoma; will continue latanoprost to both eyes nightly and cosopt to both eyes twice daily .   13. Anemia: his hgb is 11.8; will monitor   14. Diabetic peripheral neuropathy: will continue neurontin 100 mg twice daily   14. Right radial fracture and right femoral neck fracture:will continue to monitor his status   15: CKD stage IV: bun/creat 55.5/2.69; will monitor     MD is  aware of resident's narcotic use and is in agreement with current plan of care. We will attempt to wean resident as apropriate   Synthia Innocenteborah Beyla Loney NP [facility]  Contact [phone] Monday through Friday 8am- 5pm  After hours call [phone]

## 2016-03-19 LAB — HM DIABETES FOOT EXAM

## 2016-03-29 ENCOUNTER — Encounter: Payer: Self-pay | Admitting: Adult Health

## 2016-03-29 ENCOUNTER — Non-Acute Institutional Stay (SKILLED_NURSING_FACILITY): Payer: Medicare Other | Admitting: Adult Health

## 2016-03-29 DIAGNOSIS — G894 Chronic pain syndrome: Secondary | ICD-10-CM

## 2016-03-29 DIAGNOSIS — E1169 Type 2 diabetes mellitus with other specified complication: Secondary | ICD-10-CM

## 2016-03-29 DIAGNOSIS — E785 Hyperlipidemia, unspecified: Secondary | ICD-10-CM

## 2016-03-29 DIAGNOSIS — I110 Hypertensive heart disease with heart failure: Secondary | ICD-10-CM | POA: Diagnosis not present

## 2016-03-29 DIAGNOSIS — E1149 Type 2 diabetes mellitus with other diabetic neurological complication: Secondary | ICD-10-CM

## 2016-03-29 DIAGNOSIS — F0150 Vascular dementia without behavioral disturbance: Secondary | ICD-10-CM

## 2016-03-29 DIAGNOSIS — E1142 Type 2 diabetes mellitus with diabetic polyneuropathy: Secondary | ICD-10-CM

## 2016-03-29 DIAGNOSIS — N184 Chronic kidney disease, stage 4 (severe): Secondary | ICD-10-CM

## 2016-03-29 DIAGNOSIS — I5032 Chronic diastolic (congestive) heart failure: Secondary | ICD-10-CM | POA: Diagnosis not present

## 2016-03-29 NOTE — Progress Notes (Signed)
Location:  [facility] Room Number: 115 A Place of Service:  SNF (31)   CODE STATUS: DNR  No Known Allergies  Chief Complaint  Patient presents with  . Medical Management of Chronic Issues    Routine Visit    HPI:  He is a long term resident of this facility being seen for the management of his chronic illnesses. Overall his status is stable. He tells me that his pain is being adequately managed. There are no nursing concerns at this time.    Past Medical History:  Diagnosis Date  . 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 Anxiety   . At high risk for falls   . Chronic diastolic congestive heart failure (HCC) 06/13/2012   Echo in 2011 with grade 1 diastolic dysfunction and normal systolic function with EF 60%.     . DDD (degenerative disc disease), lumbosacral   . Depression   . Depression with anxiety 11/27/2013  . Diabetic peripheral neuropathy (HCC)   . Dyslipidemia   . GERD (gastroesophageal reflux disease)   . History of colon polyps   . History of diabetic ulcer of foot   . History of MRSA infection    Hx chronic diabetic ulcer's secondary MRSA  . Hypertension   . Insulin dependent type 2 diabetes mellitus (HCC)   . Peripheral vascular disease (HCC)   . Portal hypertension (HCC) 01/15/2010   Qualifier: Diagnosis of  By: Everardo All MD, Cleophas Dunker   . Renal insufficiency   . Ulcer of right ankle (HCC)   . UNSPECIFIED PERIPHERAL VASCULAR DISEASE 01/15/2010   Qualifier: Diagnosis of  By: Everardo All MD, Cleophas Dunker   . Wheelchair dependent     Past Surgical History:  Procedure Laterality Date  . COLONOSCOPY WITH ESOPHAGOGASTRODUODENOSCOPY (EGD)  09-21-2002  . INCISION AND DRAINAGE OF WOUND Right 01/10/2014   Procedure: IRRIGATION AND DEBRIDEMENT RIGHT ANKLE WOUND WITH PLACEMENT OF ACELL;  Surgeon: Wayland Denis, DO;  Location: [facility];  Service: Plastics;  Laterality: Right;  . INGUINAL HERNIA REPAIR Left 11-30-2001  . LAPAROSCOPIC  CHOLECYSTECTOMY    . TOE AMPUTATION    . TRANSTHORACIC ECHOCARDIOGRAM  12-20-2009   severe LVH/  ef 60%/  grade I diastolic dysfunction/  AV sclerosis without stenosis/  mild LAE and RAE/      Social History   Social History  . Marital status: Married    Spouse name: N/A  . Number of children: N/A  . Years of education: N/A   Occupational History  .  Retired    Retired   Social History Main Topics  . Smoking status: Former Smoker    Types: Cigarettes  . Smokeless tobacco: Never Used  . Alcohol use No     Comment: hx alcohol abuse  in remission  . Drug use: No  . Sexual activity: No   Other Topics Concern  . Not on file   Social History Narrative   Lives at [facility]   Family History  Problem Relation Age of Onset  . Diabetes Neg Hx     No DM in immediate family      VITAL SIGNS BP 140/82   Pulse 61   Temp 97.1 F (36.2 C)   Resp 20   Ht 5\' 10"  (1.778 m)   Wt 240 lb (108.9 kg)   SpO2 95%   BMI 34.44 kg/m   Patient's Medications  New Prescriptions   No medications on file  Previous Medications  ACETAMINOPHEN (TYLENOL) 325 MG TABLET    Take 2 tablets (650 mg total) by mouth every 6 (six) hours as needed for mild pain.   AMLODIPINE (NORVASC) 5 MG TABLET    Take 10 mg by mouth every morning.    ASCORBIC ACID (VITAMIN C) 500 MG TABLET    Take 500 mg by mouth 2 (two) times daily.   ASPIRIN 81 MG TABLET    Take 81 mg by mouth every morning.    BUPROPION (WELLBUTRIN) 75 MG TABLET    Take 75 mg by mouth 2 (two) times daily.    CLONIDINE (CATAPRES) 0.1 MG TABLET    Give 0.2 mg by mouth every morning (0800) and 0.1 mg by mouth every evening (1800)   COAL TAR (NEUTROGENA T-GEL) 0.5 % SHAMPOO    Apply topically. Apply topically every 96 hours for dandruff weekly as needed   DICLOFENAC SODIUM (VOLTAREN) 1 % GEL    Apply 2 g topically 3 (three) times daily. To right wrist   DOCUSATE SODIUM (COLACE) 100 MG CAPSULE    Take 1 capsule (100 mg total) by mouth 2  (two) times daily.   DORZOLAMIDE-TIMOLOL (COSOPT) 22.3-6.8 MG/ML OPHTHALMIC SOLUTION    Place 1 drop into both eyes 2 (two) times daily.   FAMOTIDINE (PEPCID) 20 MG TABLET    Take 20 mg by mouth 2 (two) times daily.   FENOFIBRATE (TRICOR) 145 MG TABLET    Take 145 mg by mouth every evening.    FLUTICASONE (FLONASE) 50 MCG/ACT NASAL SPRAY    Place 2 sprays into both nostrils daily.   FUROSEMIDE (LASIX) 40 MG TABLET    Take 40 mg by mouth 2 (two) times daily.   GABAPENTIN (NEURONTIN) 100 MG CAPSULE    Take 100 mg by mouth 2 (two) times daily.   INSULIN ASPART (NOVOLOG) 100 UNIT/ML INJECTION    Inject 5 Units into the skin 3 (three) times daily before meals. Inject as per sliding scale: if  0-150 =0 units; 151-450 = 8 units, subcutaneously before meals related to DM. Call MD for BS> 450   INSULIN GLARGINE (LANTUS) 100 UNIT/ML INJECTION    Inject 25 Units into the skin at bedtime.    ISOSORBIDE MONONITRATE (IMDUR) 60 MG 24 HR TABLET    Take 60 mg by mouth every morning.    LATANOPROST (XALATAN) 0.005 % OPHTHALMIC SOLUTION    Place 1 drop into both eyes at bedtime.     LIDOCAINE (LIDODERM) 5 %    Place 1 patch onto the skin every 12 (twelve) hours. Remove & Discard patch within 12 hours to right neck  And to left hip   LISINOPRIL (PRINIVIL,ZESTRIL) 10 MG TABLET    Take 10 mg by mouth daily.    LORAZEPAM (ATIVAN) 0.5 MG TABLET    Take 0.5 mg by mouth daily as needed for anxiety. Give 1/2 tab (0.25 mg) every morning as needed.   MEMANTINE HCL ER (NAMENDA XR) 14 MG CP24    Take 1 tablet by mouth every morning.   METOPROLOL (LOPRESSOR) 50 MG TABLET    Take 50 mg by mouth 2 (two) times daily.   NUTRITIONAL SUPPLEMENTS (NUTRITIONAL SUPPLEMENT PO)    Take by mouth. HSG Mech Soft Texture, for diet   NYSTATIN (MYCOSTATIN/NYSTOP) POWDER    Apply topically 2 (two) times daily.   ONDANSETRON (ZOFRAN) 4 MG TABLET    Take 4 mg by mouth every 8 (eight) hours as needed for nausea or vomiting.  OXYCODONE (OXYCONTIN)  20 MG 12 HR TABLET    Take 20 mg by mouth every 12 (twelve) hours.   POLYETHYLENE GLYCOL POWDER (GLYCOLAX/MIRALAX) POWDER    Take 17 g by mouth 2 (two) times daily.    POTASSIUM CHLORIDE (KLOR-CON) 20 MEQ PACKET    Take 20 mEq by mouth 2 (two) times daily.   PROTEIN SUPPLEMENT (PROMOD) POWD    Take 1 scoop by mouth 2 (two) times daily.   SENNA (SENOKOT) 8.6 MG TABLET    Take 2 tablets by mouth 2 (two) times daily.   Modified Medications   No medications on file  Discontinued Medications   LORAZEPAM (ATIVAN) 0.5 MG TABLET    Take one tablet by mouth every morning   OXYCODONE (OXYCONTIN) 30 MG 12 HR TABLET    Take one tablet by mouth every 12 hours for pain NO NOT CUT/CRUSH/CHEW     SIGNIFICANT DIAGNOSTIC EXAMS   03-14-14: right ankle tib/fib mri: 1. 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 2. T2 hyperintense probable draining abscess deep to ulceration. 3. Ankle effusion. This is probably reactive. Septic arthritis is less likely. 4. Infectious tenosynovitis of the peroneal tendons. 5. No abscess in the proximal RIGHT leg.  10-12-15: ct of head and cervical spine: No acute abnormality head or cervical spine. Advanced multilevel spondylosis with postoperative change of decompression C4-C6 identified. Bilateral pleural effusions appear small but are incompletely seen. Extensive atherosclerosis.  10-12-15: right knee x-ray: No acute bony or joint abnormality is seen. Degenerative change is present about the knee. No joint effusion. Atherosclerosis noted.  10-12-15: right wrist x-ray: Moderately displaced and comminuted distal right radial fracture is noted.  10-12-15: right hip and pelvic x-ray: Displaced fracture of the right femoral neck.  02-01-16: chest x-ray; no acute cardiopulmonary disease.   02-23-16: chest x-ray: no acute cardiopulmonary changes  02-29-16: kub: findings [place] reflect fecal retention or decreased colonic motility.  Ileus or obstruction not excluded definitively     LABS REVIEWED:   04-26-15: wbc 9.4; hgb 10.8; hct 32.4; mcv 92.0; plt 108; glucose 188; bun 29; creat 1.93; k+ 3.8; na++142; liver normal albumin 3.6; chol 98; ldl 39; trig 179; hdl 23; hgb a1c 6.8 urine micro-albumin: 55.8 05-08-15: hgb a1c 6.9  06-14-15; sed rate 20; CRP <0.5 08-30-15: hgb a1c 6.2  10-02-15: wbc 11.4; hgb 10.9; hct 33.1; mcv 93.2 ;plt 170 10-12-15: wbc 27.0; hgb 11.8; hct 34.9; mcv 92.6; plt 155; glucose 286; bun 35; create 2.50; k+ 4.2; na++ 134; mag 1.9  10-27-15: glucose 195; bun 36; creat 1.81; k+ 4.2; na++ 142  11-29-15: chol 106; ldl 58; trig 108; hdl 26; hgb a1c 5.9  01-05-16: vit B 12: 358 02-02-16: wbc 9.6; hgb 11.9; hct 35.9; mcv 95.3; plt 123; glucose 101; bun 60.2; creat 2.56; k+ 3.9; na++ 152; liver normal albumin 4.1  02-07-16: glucose 106; bun 45.2; creat 2.06; k+ 3.6; na++ 143  02-22-16: wbc 11.9; hgb 11.5; hct 34.4; mcv 93.7; plt 157; glucose 144; bun 55.5; creat 2.69; k+ 4.9; na++ 146; liver normal albumin 4.0  02-28-16: wbc 9.5; hgb 11.7; hct 34.4 ;mcv 92.8; plt 143; glucose 89; bun 35.2; creat 1.60; k+ 3.8; na++ 142; liver normal albumin 3.8    Review of Systems Constitutional: Negative for appetite change and fatigue.  HENT: no congestion  Respiratory: Negative for cough, chest tightness and shortness of breath.   Cardiovascular: Negative for chest pain, palpitations and leg swelling.  Gastrointestinal: Negative for nausea,does  have abdominal pain  Musculoskeletal: Negative for myalgias and arthralgias.  Skin: no complaints  Neurological: Negative for dizziness.  Psychiatric/Behavioral: The patient is not nervous/anxious.       Physical Exam Constitutional: No distress.  Overweight   Eyes: Conjunctivae are normal.  Neck: Neck supple. No JVD present. No thyromegaly present.  Cardiovascular: Normal rate, regular rhythm and intact distal pulses.   Respiratory: Effort normal and breath sounds normal.  No respiratory distress. He has no wheezes.  GI: Soft. Bowel sounds are normal. Has tenderness in lower quads; has large amount hard stool in rectum  Musculoskeletal: He exhibits no edema.  Able to move all extremities   Lymphadenopathy:    He has no cervical adenopathy.  Neurological: He is alert.  Skin: warm and dray  Psychiatric: He has a normal mood and affect.     ASSESSMENT/ PLAN:   1. Hypertension: will continue lopressor 50 mg twice daily; norvasc 10 mg daily asa 81 mg daily;  lisinopril  10 mg daily  clonidine 0.1 mg in the AM and 0.2 in the PM will monitor   2. Chronic diastolic heart failure: will continue lasix 40  mg twice daily lopressor 50 mg twice daily imdur 60 mg daily  2011: EF 60%   3. Constipation: will continue miralax twice daily; senna 2 tabs twice daily colace twice daily   Will give him a fleets enema now and 30 cc MOM noew  4. Hypokalemia will continue  k+ 20 meq twice daily k+ is 4.2  5. GERD: will continue pepcid 20 mg twice daily    6. Dyslipidemia: will continue tricor 145 mg daily; is not on  lipitor; ldl is 58; trig is 108  7. Diabetes:  hgb a1c is 5.9 ; will continue  novolog 8 units with meals  for cbg >=150 lantus  25 units   8. Depression with anxiety will continue wellbutrin  75 mg twice daily; ativan 0.25 mg daily prior to AM care as needed   for anxiety and will monitor  9. Vascular dementia: is without change; will continue namenda xr 14 mg daily and will monitor   10. Chronic pain: his pain is presently being managed will continue neurontin 100 mg twice daily; lidoderm patch to his right neck daily and left hip; voltaren gel 2 gm to right wrist three times daily  oxycontin  20 mg twice daily  12. Glaucoma; will continue latanoprost to both eyes nightly and cosopt to both eyes twice daily .   13. Anemia: his hgb is 11.8; will monitor   14. Diabetic peripheral neuropathy: will continue neurontin 100 mg twice daily   14. Right radial  fracture and right femoral neck fracture:will continue to monitor his status   15: CKD stage IV: bun/creat 35.2/1.60; will monitor     MD is aware of resident's narcotic use and is in agreement with current plan of care. We will attempt to wean resident as apropriate   Synthia Innocent NP [facility]  Contact [phone] Monday through Friday 8am- 5pm  After hours call [phone]

## 2016-05-03 ENCOUNTER — Encounter: Payer: Self-pay | Admitting: Adult Health

## 2016-05-03 ENCOUNTER — Non-Acute Institutional Stay (SKILLED_NURSING_FACILITY): Payer: Medicare Other | Admitting: Adult Health

## 2016-05-03 DIAGNOSIS — E785 Hyperlipidemia, unspecified: Secondary | ICD-10-CM

## 2016-05-03 DIAGNOSIS — E1142 Type 2 diabetes mellitus with diabetic polyneuropathy: Secondary | ICD-10-CM | POA: Diagnosis not present

## 2016-05-03 DIAGNOSIS — I110 Hypertensive heart disease with heart failure: Secondary | ICD-10-CM | POA: Diagnosis not present

## 2016-05-03 DIAGNOSIS — E1149 Type 2 diabetes mellitus with other diabetic neurological complication: Secondary | ICD-10-CM | POA: Diagnosis not present

## 2016-05-03 DIAGNOSIS — I5032 Chronic diastolic (congestive) heart failure: Secondary | ICD-10-CM

## 2016-05-03 DIAGNOSIS — D649 Anemia, unspecified: Secondary | ICD-10-CM

## 2016-05-03 DIAGNOSIS — F0150 Vascular dementia without behavioral disturbance: Secondary | ICD-10-CM

## 2016-05-03 DIAGNOSIS — E1169 Type 2 diabetes mellitus with other specified complication: Secondary | ICD-10-CM

## 2016-05-03 DIAGNOSIS — N184 Chronic kidney disease, stage 4 (severe): Secondary | ICD-10-CM

## 2016-05-03 NOTE — Progress Notes (Signed)
Location:   [facility] Room Number: 115 A Place of Service:  SNF (31)   CODE STATUS: DNR  No Known Allergies  Chief Complaint  Patient presents with  . Medical Management of Chronic Issues    Routine Visit    HPI:  He is a long term resident of this facility being seen for the management of his chronic illnesses. Overall there is little change in his status. He does spend all of his time in bed per his choice. He can be resistant to nursing care. There are no nursing concerns at this time,    Past Medical History:  Diagnosis Date  . 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 Anxiety   . At high risk for falls   . Chronic diastolic congestive heart failure (HCC) 06/13/2012   Echo in 2011 with grade 1 diastolic dysfunction and normal systolic function with EF 60%.     . DDD (degenerative disc disease), lumbosacral   . Depression   . Depression with anxiety 11/27/2013  . Diabetic peripheral neuropathy (HCC)   . Dyslipidemia   . GERD (gastroesophageal reflux disease)   . History of colon polyps   . History of diabetic ulcer of foot   . History of MRSA infection    Hx chronic diabetic ulcer's secondary MRSA  . Hypertension   . Insulin dependent type 2 diabetes mellitus (HCC)   . Peripheral vascular disease (HCC)   . Portal hypertension (HCC) 01/15/2010   Qualifier: Diagnosis of  By: Everardo All MD, Cleophas Dunker   . Renal insufficiency   . Ulcer of right ankle (HCC)   . UNSPECIFIED PERIPHERAL VASCULAR DISEASE 01/15/2010   Qualifier: Diagnosis of  By: Everardo All MD, Cleophas Dunker   . Wheelchair dependent     Past Surgical History:  Procedure Laterality Date  . COLONOSCOPY WITH ESOPHAGOGASTRODUODENOSCOPY (EGD)  09-21-2002  . INCISION AND DRAINAGE OF WOUND Right 01/10/2014   Procedure: IRRIGATION AND DEBRIDEMENT RIGHT ANKLE WOUND WITH PLACEMENT OF ACELL;  Surgeon: Wayland Denis, DO;  Location: [facility];  Service: Plastics;  Laterality: Right;  . INGUINAL HERNIA  REPAIR Left 11-30-2001  . LAPAROSCOPIC CHOLECYSTECTOMY    . TOE AMPUTATION    . TRANSTHORACIC ECHOCARDIOGRAM  12-20-2009   severe LVH/  ef 60%/  grade I diastolic dysfunction/  AV sclerosis without stenosis/  mild LAE and RAE/      Social History   Social History  . Marital status: Married    Spouse name: N/A  . Number of children: N/A  . Years of education: N/A   Occupational History  .  Retired    Retired   Social History Main Topics  . Smoking status: Former Smoker    Types: Cigarettes  . Smokeless tobacco: Never Used  . Alcohol use No     Comment: hx alcohol abuse  in remission  . Drug use: No  . Sexual activity: No   Other Topics Concern  . Not on file   Social History Narrative   Lives at [facility]   Family History  Problem Relation Age of Onset  . Diabetes Neg Hx     No DM in immediate family      VITAL SIGNS BP 127/68   Pulse 72   Temp 97.2 F (36.2 C)   Resp 18   Ht 5\' 10"  (1.778 m)   Wt 211 lb 3.2 oz (95.8 kg)   SpO2 98%   BMI 30.30 kg/m  Patient's Medications  New Prescriptions   No medications on file  Previous Medications   ACETAMINOPHEN (TYLENOL) 325 MG TABLET    Take 2 tablets (650 mg total) by mouth every 6 (six) hours as needed for mild pain.   AMLODIPINE (NORVASC) 10 MG TABLET    Take 10 mg by mouth daily.   ASCORBIC ACID (VITAMIN C) 500 MG TABLET    Take 500 mg by mouth 2 (two) times daily.   ASPIRIN 81 MG TABLET    Take 81 mg by mouth every morning.    BUPROPION (WELLBUTRIN) 75 MG TABLET    Take 75 mg by mouth 2 (two) times daily.    CLONIDINE (CATAPRES) 0.1 MG TABLET    Give 0.2 mg by mouth every morning (0800) and 0.1 mg by mouth every evening (1800)   COAL TAR (NEUTROGENA T-GEL) 0.5 % SHAMPOO    Apply topically. Apply topically every 96 hours for dandruff weekly as needed   DICLOFENAC SODIUM (VOLTAREN) 1 % GEL    Apply 2 g topically 3 (three) times daily. To right wrist   DOCUSATE SODIUM (COLACE) 100 MG CAPSULE     Take 1 capsule (100 mg total) by mouth 2 (two) times daily.   DORZOLAMIDE-TIMOLOL (COSOPT) 22.3-6.8 MG/ML OPHTHALMIC SOLUTION    Place 1 drop into both eyes 2 (two) times daily.   FAMOTIDINE (PEPCID) 20 MG TABLET    Take 20 mg by mouth 2 (two) times daily.   FENOFIBRATE (TRICOR) 145 MG TABLET    Take 145 mg by mouth every evening.    FLUTICASONE (FLONASE) 50 MCG/ACT NASAL SPRAY    Place 2 sprays into both nostrils daily.   FUROSEMIDE (LASIX) 40 MG TABLET    Take 40 mg by mouth 2 (two) times daily.   GABAPENTIN (NEURONTIN) 100 MG CAPSULE    Take 100 mg by mouth 2 (two) times daily.   INSULIN ASPART (NOVOLOG) 100 UNIT/ML INJECTION    Inject 5 Units into the skin 3 (three) times daily before meals. Inject as per sliding scale: if  0-150 =0 units; 151-450 = 8 units, subcutaneously before meals related to DM. Call MD for BS> 450   INSULIN GLARGINE (LANTUS) 100 UNIT/ML INJECTION    Inject 25 Units into the skin at bedtime.    ISOSORBIDE MONONITRATE (IMDUR) 60 MG 24 HR TABLET    Take 60 mg by mouth every morning.    LATANOPROST (XALATAN) 0.005 % OPHTHALMIC SOLUTION    Place 1 drop into both eyes at bedtime.     LIDOCAINE (LIDODERM) 5 %    Place 1 patch onto the skin every 12 (twelve) hours. Remove & Discard patch within 12 hours to right neck   LISINOPRIL (PRINIVIL,ZESTRIL) 10 MG TABLET    Take 10 mg by mouth daily.    LORAZEPAM (ATIVAN) 0.5 MG TABLET    Take 0.5 mg by mouth daily as needed for anxiety. Give 1/2 tab (0.25 mg) every morning as needed.   MEMANTINE HCL ER (NAMENDA XR) 14 MG CP24    Take 1 tablet by mouth every morning.   METOPROLOL (LOPRESSOR) 50 MG TABLET    Take 50 mg by mouth 2 (two) times daily.   NUTRITIONAL SUPPLEMENTS (NUTRITIONAL SUPPLEMENT PO)    Take by mouth. HSG Mech Soft Texture, for diet   NYSTATIN (MYCOSTATIN/NYSTOP) POWDER    Apply topically 2 (two) times daily.   ONDANSETRON (ZOFRAN) 4 MG TABLET    Take 4 mg by mouth every 8 (eight)  hours as needed for nausea or vomiting.    OXYCODONE (OXYCONTIN) 20 MG 12 HR TABLET    Take 20 mg by mouth every 12 (twelve) hours.   POLYETHYLENE GLYCOL POWDER (GLYCOLAX/MIRALAX) POWDER    Take 17 g by mouth daily.    POTASSIUM CHLORIDE (KLOR-CON) 20 MEQ PACKET    Take 20 mEq by mouth 2 (two) times daily.   PROTEIN SUPPLEMENT (PROMOD) POWD    Take 1 scoop by mouth 2 (two) times daily.   SENNA (SENOKOT) 8.6 MG TABLET    Take 2 tablets by mouth 2 (two) times daily.    SILVER HYDROGEL GEL    Apply topically. Apply to left great toe topically every day shift for wound care cleanse area,  pat dry apply silver hydrogel,  cover with dry dressing daily   ZINC SULFATE 220 (50 ZN) MG CAPSULE    Take 220 mg by mouth daily.  Modified Medications   No medications on file  Discontinued Medications   AMLODIPINE (NORVASC) 5 MG TABLET    Take 10 mg by mouth every morning.      SIGNIFICANT DIAGNOSTIC EXAMS  03-14-14: right ankle tib/fib mri: 1. 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 2. T2 hyperintense probable draining abscess deep to ulceration. 3. Ankle effusion. This is probably reactive. Septic arthritis is less likely. 4. Infectious tenosynovitis of the peroneal tendons. 5. No abscess in the proximal RIGHT leg.  10-12-15: ct of head and cervical spine: No acute abnormality head or cervical spine. Advanced multilevel spondylosis with postoperative change of decompression C4-C6 identified. Bilateral pleural effusions appear small but are incompletely seen. Extensive atherosclerosis.  10-12-15: right knee x-ray: No acute bony or joint abnormality is seen. Degenerative change is present about the knee. No joint effusion. Atherosclerosis noted.  10-12-15: right wrist x-ray: Moderately displaced and comminuted distal right radial fracture is noted.  10-12-15: right hip and pelvic x-ray: Displaced fracture of the right femoral neck.  02-01-16: chest x-ray; no acute cardiopulmonary disease.    02-23-16: chest x-ray: no acute cardiopulmonary changes  02-29-16: kub: findings Rossmann reflect fecal retention or decreased colonic motility. Ileus or obstruction not excluded definitively     LABS REVIEWED:   05-08-15: hgb a1c 6.9  06-14-15; sed rate 20; CRP <0.5 08-30-15: hgb a1c 6.2  10-02-15: wbc 11.4; hgb 10.9; hct 33.1; mcv 93.2 ;plt 170 10-12-15: wbc 27.0; hgb 11.8; hct 34.9; mcv 92.6; plt 155; glucose 286; bun 35; create 2.50; k+ 4.2; na++ 134; mag 1.9  10-27-15: glucose 195; bun 36; creat 1.81; k+ 4.2; na++ 142  11-29-15: chol 106; ldl 58; trig 108; hdl 26; hgb a1c 5.9  01-05-16: vit B 12: 358 02-02-16: wbc 9.6; hgb 11.9; hct 35.9; mcv 95.3; plt 123; glucose 101; bun 60.2; creat 2.56; k+ 3.9; na++ 152; liver normal albumin 4.1  02-07-16: glucose 106; bun 45.2; creat 2.06; k+ 3.6; na++ 143  02-22-16: wbc 11.9; hgb 11.5; hct 34.4; mcv 93.7; plt 157; glucose 144; bun 55.5; creat 2.69; k+ 4.9; na++ 146; liver normal albumin 4.0  02-28-16: wbc 9.5; hgb 11.7; hct 34.4 ;mcv 92.8; plt 143; glucose 89; bun 35.2; creat 1.60; k+ 3.8; na++ 142; liver normal albumin 3.8    Review of Systems Constitutional: Negative for appetite change and fatigue.  HENT: no congestion  Respiratory: Negative for cough, chest tightness and shortness of breath.   Cardiovascular: Negative for chest pain, palpitations and leg swelling.  Gastrointestinal: Negative for nausea abdominal pain Musculoskeletal:  Negative for myalgias and arthralgias.  Skin: no complaints  Neurological: Negative for dizziness.  Psychiatric/Behavioral: The patient is not nervous/anxious.       Physical Exam Constitutional: No distress.  Overweight   Eyes: Conjunctivae are normal.  Neck: Neck supple. No JVD present. No thyromegaly present.  Cardiovascular: Normal rate, regular rhythm and intact distal pulses.   Respiratory: Effort normal and breath sounds normal. No respiratory distress. He has no wheezes.  GI: Soft. Bowel sounds are  normal no tenderness present Musculoskeletal: He exhibits no edema.  Able to move all extremities   Lymphadenopathy:    He has no cervical adenopathy.  Neurological: He is alert.  Skin: warm and dray  Psychiatric: He has a normal mood and affect.     ASSESSMENT/ PLAN:   1. Hypertension: will continue lopressor 50 mg twice daily; norvasc 10 mg daily asa 81 mg daily;  lisinopril  10 mg daily  clonidine 0.1 mg in the AM and 0.2 in the PM will monitor   2. Chronic diastolic heart failure: will continue lasix 40  mg twice daily lopressor 50 mg twice daily imdur 60 mg daily  2011: EF 60%   3. Constipation: will continue miralax twice daily; senna 2 tabs twice daily colace twice daily     4. Hypokalemia will continue  k+ 20 meq twice daily k+ is 4.2  5. GERD: will continue pepcid 20 mg twice daily    6. Dyslipidemia: will continue tricor 145 mg daily; is not on  lipitor; ldl is 58; trig is 108  7. Diabetes:  hgb a1c is 5.9 ; will continue  novolog 3 units with an additional  8 units with meals  for cbg >=150 lantus  25 units   8. Depression with anxiety will continue wellbutrin  75 mg twice daily; ativan 0.25 mg daily prior to AM care as needed   for anxiety and will monitor  9. Vascular dementia: is without change; will continue namenda xr 14 mg daily and will monitor   10. Chronic pain: his pain is presently being managed will continue neurontin 100 mg twice daily; lidoderm patch to his right neck daily and left hip; voltaren gel 2 gm to right wrist three times daily  oxycontin  20 mg twice daily  12. Glaucoma; will continue latanoprost to both eyes nightly and cosopt to both eyes twice daily .   13. Anemia: his hgb is 11.8; will monitor   14. Diabetic peripheral neuropathy: will continue neurontin 100 mg twice daily   14. Right radial fracture and right femoral neck fracture:will continue to monitor his status   15: CKD stage IV: bun/creat 35.2/1.60; will monitor   16.  Allergic rhinitis: will continue flonase daily   Will check hgb a1c lipids and urine micor-albumin    MD is aware of resident's narcotic use and is in agreement with current plan of care. We will attempt to wean resident as apropriate     Synthia Innocent NP [facility]  Contact [phone] Monday through Friday 8am- 5pm  After hours call [phone]

## 2016-05-04 LAB — LIPID PANEL
CHOLESTEROL: 131 mg/dL (ref 0–200)
HDL: 19 mg/dL — AB (ref 35–70)
LDL Cholesterol: 72 mg/dL
Triglycerides: 200 mg/dL — AB (ref 40–160)

## 2016-05-04 LAB — MICROALBUMIN, URINE: Microalb, Ur: 16.7

## 2016-05-19 LAB — BASIC METABOLIC PANEL
BUN: 78 mg/dL — AB (ref 4–21)
CREATININE: 3.6 mg/dL — AB (ref 0.6–1.3)
GLUCOSE: 200 mg/dL
POTASSIUM: 4.2 mmol/L (ref 3.4–5.3)
Sodium: 147 mmol/L (ref 137–147)

## 2016-05-19 LAB — HEPATIC FUNCTION PANEL
ALT: 17 U/L (ref 10–40)
AST: 17 U/L (ref 14–40)
Alkaline Phosphatase: 52 U/L (ref 25–125)
Bilirubin, Total: 0.5 mg/dL

## 2016-05-19 LAB — CBC AND DIFFERENTIAL
HEMATOCRIT: 30 % — AB (ref 41–53)
Hemoglobin: 10 g/dL — AB (ref 13.5–17.5)
Neutrophils Absolute: 5 /uL
PLATELETS: 113 10*3/uL — AB (ref 150–399)
WBC: 9.9 10*3/mL

## 2016-05-27 ENCOUNTER — Encounter: Payer: Self-pay | Admitting: Adult Health

## 2016-05-27 ENCOUNTER — Non-Acute Institutional Stay (SKILLED_NURSING_FACILITY): Payer: Medicare Other | Admitting: Adult Health

## 2016-05-27 DIAGNOSIS — L03039 Cellulitis of unspecified toe: Secondary | ICD-10-CM

## 2016-05-27 NOTE — Progress Notes (Signed)
Location:   [facility] Room Number: 115 A Place of Service:  SNF (31)   CODE STATUS: DNR  No Known Allergies  Chief Complaint  Patient presents with  . Acute Visit    Toenail inection    HPI:  He has ulcerations on his bilateral great toes. His toes are hot to touch and inflamed. He is presently on doxycycline. There are no reports of fever present. He is not voicing any complaints.    Past Medical History:  Diagnosis Date  . 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 Anxiety   . At high risk for falls   . Chronic diastolic congestive heart failure (HCC) 06/13/2012   Echo in 2011 with grade 1 diastolic dysfunction and normal systolic function with EF 60%.     . DDD (degenerative disc disease), lumbosacral   . Depression   . Depression with anxiety 11/27/2013  . Diabetic peripheral neuropathy (HCC)   . Dyslipidemia   . GERD (gastroesophageal reflux disease)   . History of colon polyps   . History of diabetic ulcer of foot   . History of MRSA infection    Hx chronic diabetic ulcer's secondary MRSA  . Hypertension   . Insulin dependent type 2 diabetes mellitus (HCC)   . Peripheral vascular disease (HCC)   . Portal hypertension (HCC) 01/15/2010   Qualifier: Diagnosis of  By: Everardo All MD, Cleophas Dunker   . Renal insufficiency   . Ulcer of right ankle (HCC)   . UNSPECIFIED PERIPHERAL VASCULAR DISEASE 01/15/2010   Qualifier: Diagnosis of  By: Everardo All MD, Cleophas Dunker   . Wheelchair dependent     Past Surgical History:  Procedure Laterality Date  . COLONOSCOPY WITH ESOPHAGOGASTRODUODENOSCOPY (EGD)  09-21-2002  . INCISION AND DRAINAGE OF WOUND Right 01/10/2014   Procedure: IRRIGATION AND DEBRIDEMENT RIGHT ANKLE WOUND WITH PLACEMENT OF ACELL;  Surgeon: Wayland Denis, DO;  Location: [facility];  Service: Plastics;  Laterality: Right;  . INGUINAL HERNIA REPAIR Left 11-30-2001  . LAPAROSCOPIC CHOLECYSTECTOMY    . TOE AMPUTATION    . TRANSTHORACIC  ECHOCARDIOGRAM  12-20-2009   severe LVH/  ef 60%/  grade I diastolic dysfunction/  AV sclerosis without stenosis/  mild LAE and RAE/      Social History   Social History  . Marital status: Married    Spouse name: N/A  . Number of children: N/A  . Years of education: N/A   Occupational History  .  Retired    Retired   Social History Main Topics  . Smoking status: Former Smoker    Types: Cigarettes  . Smokeless tobacco: Never Used  . Alcohol use No     Comment: hx alcohol abuse  in remission  . Drug use: No  . Sexual activity: No   Other Topics Concern  . Not on file   Social History Narrative   Lives at [facility]   Family History  Problem Relation Age of Onset  . Diabetes Neg Hx     No DM in immediate family      VITAL SIGNS BP (!) 147/72   Pulse (!) 56   Temp 98.1 F (36.7 C)   Resp 18   Ht 5\' 10"  (1.778 m)   Wt 202 lb 3.2 oz (91.7 kg)   SpO2 98%   BMI 29.01 kg/m   Patient's Medications  New Prescriptions   No medications on file  Previous Medications   ACETAMINOPHEN (TYLENOL) 325 MG  TABLET    Take 2 tablets (650 mg total) by mouth every 6 (six) hours as needed for mild pain.   AMINO ACIDS-PROTEIN HYDROLYS (FEEDING SUPPLEMENT, PRO-STAT SUGAR FREE 64,) LIQD    Take 30 mLs by mouth 2 (two) times daily.   AMLODIPINE (NORVASC) 10 MG TABLET    Take 10 mg by mouth daily.   ASCORBIC ACID (VITAMIN C) 500 MG TABLET    Take 500 mg by mouth 2 (two) times daily.   ASPIRIN 81 MG TABLET    Take 81 mg by mouth every morning.    BISMUTH TRIBROMOPH-PETROLATUM EX    Apply topically. Apply to right lower leg topically every 3 days   BUPROPION (WELLBUTRIN) 75 MG TABLET    Take 75 mg by mouth 2 (two) times daily.    CLONIDINE (CATAPRES) 0.1 MG TABLET    Give 0.2 mg by mouth every morning (0800) and 0.1 mg by mouth every evening (1800)   COAL TAR (NEUTROGENA T-GEL) 0.5 % SHAMPOO    Apply topically. Apply topically every 96 hours for dandruff weekly as needed    DICLOFENAC SODIUM (VOLTAREN) 1 % GEL    Apply 2 g topically 3 (three) times daily. To right wrist   DOCUSATE SODIUM (COLACE) 100 MG CAPSULE    Take 1 capsule (100 mg total) by mouth 2 (two) times daily.   DORZOLAMIDE-TIMOLOL (COSOPT) 22.3-6.8 MG/ML OPHTHALMIC SOLUTION    Place 1 drop into both eyes 2 (two) times daily.   DOXYCYCLINE (DORYX) 100 MG EC TABLET    Take 100 mg by mouth 2 (two) times daily.   FAMOTIDINE (PEPCID) 20 MG TABLET    Take 20 mg by mouth 2 (two) times daily.   FENOFIBRATE (TRICOR) 145 MG TABLET    Take 145 mg by mouth every evening.    FLUTICASONE (FLONASE) 50 MCG/ACT NASAL SPRAY    Place 2 sprays into both nostrils daily.   FUROSEMIDE (LASIX) 40 MG TABLET    Take 40 mg by mouth 2 (two) times daily.   GABAPENTIN (NEURONTIN) 100 MG CAPSULE    Take 100 mg by mouth 2 (two) times daily.   INSULIN ASPART (NOVOLOG) 100 UNIT/ML INJECTION    Inject 5 Units into the skin 3 (three) times daily before meals. Inject as per sliding scale: if  0-150 =0 units; 151-450 = 8 units, subcutaneously before meals related to DM. Call MD for BS> 450   INSULIN GLARGINE (LANTUS) 100 UNIT/ML INJECTION    Inject 25 Units into the skin at bedtime.    ISOSORBIDE MONONITRATE (IMDUR) 60 MG 24 HR TABLET    Take 60 mg by mouth every morning.    LATANOPROST (XALATAN) 0.005 % OPHTHALMIC SOLUTION    Place 1 drop into both eyes at bedtime.     LIDOCAINE (LIDODERM) 5 %    Place 1 patch onto the skin every 12 (twelve) hours. Remove & Discard patch within 12 hours to right neck   LISINOPRIL (PRINIVIL,ZESTRIL) 10 MG TABLET    Take 10 mg by mouth daily.    LORAZEPAM (ATIVAN) 0.5 MG TABLET    Take 0.5 mg by mouth daily as needed for anxiety. Give 1/2 tab (0.25 mg) every morning as needed.   MEMANTINE HCL ER (NAMENDA XR) 14 MG CP24    Take 1 tablet by mouth every morning.   METOPROLOL (LOPRESSOR) 50 MG TABLET    Take 50 mg by mouth 2 (two) times daily.   NUTRITIONAL SUPPLEMENTS (NUTRITIONAL SUPPLEMENT PO)  Take by  mouth. HSG Mech Soft Texture, for diet   NUTRITIONAL SUPPLEMENTS (NUTRITIONAL SUPPLEMENT PO)    Take by mouth. House Shake two times daily at lunch and dinner meals   NYSTATIN (MYCOSTATIN/NYSTOP) POWDER    Apply topically 2 (two) times daily.   ONDANSETRON (ZOFRAN) 4 MG TABLET    Take 4 mg by mouth every 8 (eight) hours as needed for nausea or vomiting.   OXYCODONE (OXYCONTIN) 20 MG 12 HR TABLET    Take 20 mg by mouth every 12 (twelve) hours.   POLYETHYLENE GLYCOL POWDER (GLYCOLAX/MIRALAX) POWDER    Take 17 g by mouth daily.    POTASSIUM CHLORIDE (KLOR-CON) 20 MEQ PACKET    Take 20 mEq by mouth 2 (two) times daily.   SACCHAROMYCES BOULARDII PO    Take 1 capsule by mouth 2 (two) times daily.   SENNA (SENOKOT) 8.6 MG TABLET    Take 2 tablets by mouth 2 (two) times daily.    WOUND DRESSINGS GEL    Apply topically as needed for wound care. TheraHoney Gel -Apply to left 1st toe topically daily   ZINC SULFATE 220 (50 ZN) MG CAPSULE    Take 220 mg by mouth daily.  Modified Medications   No medications on file  Discontinued Medications   PROTEIN SUPPLEMENT (PROMOD) POWD    Take 1 scoop by mouth 2 (two) times daily.   SILVER HYDROGEL GEL    Apply topically. Apply to left great toe topically every day shift for wound care cleanse area,  pat dry apply silver hydrogel,  cover with dry dressing daily     SIGNIFICANT DIAGNOSTIC EXAMS  03-14-14: right ankle tib/fib mri: 1. 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 2. T2 hyperintense probable draining abscess deep to ulceration. 3. Ankle effusion. This is probably reactive. Septic arthritis is less likely. 4. Infectious tenosynovitis of the peroneal tendons. 5. No abscess in the proximal RIGHT leg.  10-12-15: ct of head and cervical spine: No acute abnormality head or cervical spine. Advanced multilevel spondylosis with postoperative change of decompression C4-C6 identified. Bilateral pleural effusions  appear small but are incompletely seen. Extensive atherosclerosis.  10-12-15: right knee x-ray: No acute bony or joint abnormality is seen. Degenerative change is present about the knee. No joint effusion. Atherosclerosis noted.  10-12-15: right wrist x-ray: Moderately displaced and comminuted distal right radial fracture is noted.  10-12-15: right hip and pelvic x-ray: Displaced fracture of the right femoral neck.  02-01-16: chest x-ray; no acute cardiopulmonary disease.   02-23-16: chest x-ray: no acute cardiopulmonary changes  02-29-16: kub: findings Helbig reflect fecal retention or decreased colonic motility. Ileus or obstruction not excluded definitively     LABS REVIEWED:   05-08-15: hgb a1c 6.9  06-14-15; sed rate 20; CRP <0.5 08-30-15: hgb a1c 6.2  10-02-15: wbc 11.4; hgb 10.9; hct 33.1; mcv 93.2 ;plt 170 10-12-15: wbc 27.0; hgb 11.8; hct 34.9; mcv 92.6; plt 155; glucose 286; bun 35; create 2.50; k+ 4.2; na++ 134; mag 1.9  10-27-15: glucose 195; bun 36; creat 1.81; k+ 4.2; na++ 142  11-29-15: chol 106; ldl 58; trig 108; hdl 26; hgb a1c 5.9  01-05-16: vit B 12: 358 02-02-16: wbc 9.6; hgb 11.9; hct 35.9; mcv 95.3; plt 123; glucose 101; bun 60.2; creat 2.56; k+ 3.9; na++ 152; liver normal albumin 4.1  02-07-16: glucose 106; bun 45.2; creat 2.06; k+ 3.6; na++ 143  02-22-16: wbc 11.9; hgb 11.5; hct 34.4; mcv 93.7; plt 157;  glucose 144; bun 55.5; creat 2.69; k+ 4.9; na++ 146; liver normal albumin 4.0  02-28-16: wbc 9.5; hgb 11.7; hct 34.4 ;mcv 92.8; plt 143; glucose 89; bun 35.2; creat 1.60; k+ 3.8; na++ 142; liver normal albumin 3.8      Review of Systems Constitutional: Negative for appetite change and fatigue.  HENT: no congestion  Respiratory: Negative for cough, chest tightness and shortness of breath.   Cardiovascular: Negative for chest pain, palpitations and leg swelling.  Gastrointestinal: Negative for nausea abdominal pain Musculoskeletal: Negative for myalgias and arthralgias.    Skin: no complaints  Neurological: Negative for dizziness.  Psychiatric/Behavioral: The patient is not nervous/anxious.       Physical Exam Constitutional: No distress.  Overweight   Eyes: Conjunctivae are normal.  Neck: Neck supple. No JVD present. No thyromegaly present.  Cardiovascular: Normal rate, regular rhythm and intact distal pulses.   Respiratory: Effort normal and breath sounds normal. No respiratory distress. He has no wheezes.  GI: Soft. Bowel sounds are normal no tenderness present Musculoskeletal: He exhibits no edema.  Able to move all extremities   Lymphadenopathy:    He has no cervical adenopathy.  Neurological: He is alert.  Skin: warm and dray  Left great toe 1.2 x 1.5 cm Right great toe: 1.0 x 1.3 cm Areas are red and inflamed.  Psychiatric: He has a normal mood and affect.     ASSESSMENT/ PLAN:  1. Cellulitis of bilateral great toes: will extend the doxycycline for a total of 2 weeks and will monitor   MD is aware of resident's narcotic use and is in agreement with current plan of care. We will attempt to wean resident as apropriate     Synthia Innocenteborah Collin Rengel NP [facility]  Contact [phone] Monday through Friday 8am- 5pm  After hours call [phone]

## 2016-06-03 ENCOUNTER — Non-Acute Institutional Stay (SKILLED_NURSING_FACILITY): Payer: Medicare Other | Admitting: Adult Health

## 2016-06-03 ENCOUNTER — Encounter: Payer: Self-pay | Admitting: Adult Health

## 2016-06-03 DIAGNOSIS — F418 Other specified anxiety disorders: Secondary | ICD-10-CM

## 2016-06-03 DIAGNOSIS — N184 Chronic kidney disease, stage 4 (severe): Secondary | ICD-10-CM

## 2016-06-03 DIAGNOSIS — E1142 Type 2 diabetes mellitus with diabetic polyneuropathy: Secondary | ICD-10-CM | POA: Diagnosis not present

## 2016-06-03 DIAGNOSIS — I110 Hypertensive heart disease with heart failure: Secondary | ICD-10-CM

## 2016-06-03 DIAGNOSIS — E785 Hyperlipidemia, unspecified: Secondary | ICD-10-CM | POA: Diagnosis not present

## 2016-06-03 DIAGNOSIS — I5032 Chronic diastolic (congestive) heart failure: Secondary | ICD-10-CM | POA: Diagnosis not present

## 2016-06-03 DIAGNOSIS — E1169 Type 2 diabetes mellitus with other specified complication: Secondary | ICD-10-CM

## 2016-06-03 DIAGNOSIS — G894 Chronic pain syndrome: Secondary | ICD-10-CM

## 2016-06-03 DIAGNOSIS — E1149 Type 2 diabetes mellitus with other diabetic neurological complication: Secondary | ICD-10-CM

## 2016-06-03 NOTE — Progress Notes (Signed)
Location:   [facility] Room Number: 115 A Place of Service:  SNF (31)   CODE STATUS: DNR  No Known Allergies  Chief Complaint  Patient presents with  . Medical Management of Chronic Issues    1 month follow up    HPI:    Past Medical History:  Diagnosis Date  . 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 Anxiety   . At high risk for falls   . Chronic diastolic congestive heart failure (HCC) 06/13/2012   Echo in 2011 with grade 1 diastolic dysfunction and normal systolic function with EF 60%.     . DDD (degenerative disc disease), lumbosacral   . Depression   . Depression with anxiety 11/27/2013  . Diabetic peripheral neuropathy (HCC)   . Dyslipidemia   . GERD (gastroesophageal reflux disease)   . History of colon polyps   . History of diabetic ulcer of foot   . History of MRSA infection    Hx chronic diabetic ulcer's secondary MRSA  . Hypertension   . Insulin dependent type 2 diabetes mellitus (HCC)   . Peripheral vascular disease (HCC)   . Portal hypertension (HCC) 01/15/2010   Qualifier: Diagnosis of  By: Everardo All MD, Cleophas Dunker   . Renal insufficiency   . Ulcer of right ankle (HCC)   . UNSPECIFIED PERIPHERAL VASCULAR DISEASE 01/15/2010   Qualifier: Diagnosis of  By: Everardo All MD, Cleophas Dunker   . Wheelchair dependent     Past Surgical History:  Procedure Laterality Date  . COLONOSCOPY WITH ESOPHAGOGASTRODUODENOSCOPY (EGD)  09-21-2002  . INCISION AND DRAINAGE OF WOUND Right 01/10/2014   Procedure: IRRIGATION AND DEBRIDEMENT RIGHT ANKLE WOUND WITH PLACEMENT OF ACELL;  Surgeon: Wayland Denis, DO;  Location: [facility];  Service: Plastics;  Laterality: Right;  . INGUINAL HERNIA REPAIR Left 11-30-2001  . LAPAROSCOPIC CHOLECYSTECTOMY    . TOE AMPUTATION    . TRANSTHORACIC ECHOCARDIOGRAM  12-20-2009   severe LVH/  ef 60%/  grade I diastolic dysfunction/  AV sclerosis without stenosis/  mild LAE and RAE/      Social History   Social History  .  Marital status: Married    Spouse name: N/A  . Number of children: N/A  . Years of education: N/A   Occupational History  .  Retired    Retired   Social History Main Topics  . Smoking status: Former Smoker    Types: Cigarettes  . Smokeless tobacco: Never Used  . Alcohol use No     Comment: hx alcohol abuse  in remission  . Drug use: No  . Sexual activity: No   Other Topics Concern  . Not on file   Social History Narrative   Lives at [facility]   Family History  Problem Relation Age of Onset  . Diabetes Neg Hx     No DM in immediate family      VITAL SIGNS BP 126/60   Pulse 70   Temp 97.9 F (36.6 C)   Resp 18   Ht  (1.778 m)   Wt 202 lb (91.6 kg)   SpO2 96%   BMI 28.98 kg/m   Patient's Medications  New Prescriptions   No medications on file  Previous Medications   ACETAMINOPHEN (TYLENOL) 325 MG TABLET    Take 2 tablets (650 mg total) by mouth every 6 (six) hours as needed for mild pain.   AMINO ACIDS-PROTEIN HYDROLYS (FEEDING SUPPLEMENT, PRO-STAT SUGAR FREE 64,) LIQD  Take 30 mLs by mouth 2 (two) times daily.   AMLODIPINE (NORVASC) 10 MG TABLET    Take 10 mg by mouth daily.   ASCORBIC ACID (VITAMIN C) 500 MG TABLET    Take 500 mg by mouth 2 (two) times daily.   ASPIRIN 81 MG TABLET    Take 81 mg by mouth every morning.    BISMUTH TRIBROMOPH-PETROLATUM EX    Apply topically. Apply to right lower leg topically every 3 days   BUPROPION (WELLBUTRIN) 75 MG TABLET    Take 75 mg by mouth 2 (two) times daily.    CLONIDINE (CATAPRES) 0.1 MG TABLET    Give 0.2 mg by mouth every morning (0800) and 0.1 mg by mouth every evening (1800)   COAL TAR (NEUTROGENA T-GEL) 0.5 % SHAMPOO    Apply topically every Wednesday and Saturday weekly as needed for Dandruff as resident allows   DICLOFENAC SODIUM (VOLTAREN) 1 % GEL    Apply 2 g topically 3 (three) times daily. To right wrist   DOCUSATE SODIUM (COLACE) 100 MG CAPSULE    Take 1 capsule (100 mg total) by mouth  2 (two) times daily.   DORZOLAMIDE-TIMOLOL (COSOPT) 22.3-6.8 MG/ML OPHTHALMIC SOLUTION    Place 1 drop into both eyes 2 (two) times daily.   DOXYCYCLINE (DORYX) 100 MG EC TABLET    Take 100 mg by mouth 2 (two) times daily.   FAMOTIDINE (PEPCID) 20 MG TABLET    Take 20 mg by mouth 2 (two) times daily.   FENOFIBRATE (TRICOR) 145 MG TABLET    Take 145 mg by mouth every evening.    FLUTICASONE (FLONASE) 50 MCG/ACT NASAL SPRAY    Place 2 sprays into both nostrils daily.   FUROSEMIDE (LASIX) 40 MG TABLET    Take 40 mg by mouth 2 (two) times daily.   GABAPENTIN (NEURONTIN) 100 MG CAPSULE    Take 100 mg by mouth 2 (two) times daily.   INSULIN ASPART (NOVOLOG) 100 UNIT/ML INJECTION    Inject 5 Units into the skin 3 (three) times daily before meals. Inject as per sliding scale: if  0-150 =0 units; 151-450 = 8 units, subcutaneously before meals related to DM. Call MD for BS> 450   INSULIN GLARGINE (LANTUS) 100 UNIT/ML INJECTION    Inject 25 Units into the skin at bedtime.    ISOSORBIDE MONONITRATE (IMDUR) 60 MG 24 HR TABLET    Take 60 mg by mouth every morning.    LATANOPROST (XALATAN) 0.005 % OPHTHALMIC SOLUTION    Place 1 drop into both eyes at bedtime.     LIDOCAINE (LIDODERM) 5 %    Place 1 patch onto the skin every 12 (twelve) hours. Apply 1 patch topically to right neck and left hip daily in the PM and remove in the AM . Remove & Discard patch within 12 hours   LISINOPRIL (PRINIVIL,ZESTRIL) 10 MG TABLET    Take 10 mg by mouth daily.    LORAZEPAM (ATIVAN) 0.5 MG TABLET    Take 0.5 mg by mouth daily as needed for anxiety. Give 1/2 tab (0.25 mg) every morning as needed.   MEMANTINE HCL ER (NAMENDA XR) 14 MG CP24    Take 1 tablet by mouth every morning.   METOPROLOL (LOPRESSOR) 50 MG TABLET    Take 50 mg by mouth 2 (two) times daily.   NUTRITIONAL SUPPLEMENTS (NUTRITIONAL SUPPLEMENT PO)    Take by mouth. HSG Mech Soft Texture, for diet   NUTRITIONAL SUPPLEMENTS (NUTRITIONAL SUPPLEMENT  PO)    Take by mouth.  House Shake two times daily at lunch and dinner meals   NYSTATIN (MYCOSTATIN/NYSTOP) POWDER    Apply topically 2 (two) times daily.   ONDANSETRON (ZOFRAN) 4 MG TABLET    Take 4 mg by mouth every 8 (eight) hours as needed for nausea or vomiting.   OXYCODONE (OXYCONTIN) 20 MG 12 HR TABLET    Take 20 mg by mouth every 12 (twelve) hours.   POLYETHYLENE GLYCOL POWDER (GLYCOLAX/MIRALAX) POWDER    Take 17 g by mouth daily.    POTASSIUM CHLORIDE (KLOR-CON) 20 MEQ PACKET    Take 20 mEq by mouth 2 (two) times daily.   SACCHAROMYCES BOULARDII PO    Take 1 capsule by mouth 2 (two) times daily.   SENNA (SENOKOT) 8.6 MG TABLET    Take 2 tablets by mouth 2 (two) times daily.    WOUND DRESSINGS GEL    Apply topically as needed for wound care. TheraHoney Gel -Apply to left 1st toe topically daily   ZINC SULFATE 220 (50 ZN) MG CAPSULE    Take 220 mg by mouth daily.  Modified Medications   No medications on file  Discontinued Medications   No medications on file     SIGNIFICANT DIAGNOSTIC EXAMS   03-14-14: right ankle tib/fib mri: 1. 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 2. T2 hyperintense probable draining abscess deep to ulceration. 3. Ankle effusion. This is probably reactive. Septic arthritis is less likely. 4. Infectious tenosynovitis of the peroneal tendons. 5. No abscess in the proximal RIGHT leg.  10-12-15: ct of head and cervical spine: No acute abnormality head or cervical spine. Advanced multilevel spondylosis with postoperative change of decompression C4-C6 identified. Bilateral pleural effusions appear small but are incompletely seen. Extensive atherosclerosis.  10-12-15: right knee x-ray: No acute bony or joint abnormality is seen. Degenerative change is present about the knee. No joint effusion. Atherosclerosis noted.  10-12-15: right wrist x-ray: Moderately displaced and comminuted distal right radial fracture is noted.  10-12-15:  right hip and pelvic x-ray: Displaced fracture of the right femoral neck.  02-01-16: chest x-ray; no acute cardiopulmonary disease.   02-23-16: chest x-ray: no acute cardiopulmonary changes  02-29-16: kub: findings [place] reflect fecal retention or decreased colonic motility. Ileus or obstruction not excluded definitively   05-17-16: chest x-ray: no acute cardiopulmonary disease seen. The findings are unchanged form 02-23-16.    LABS REVIEWED:   06-14-15; sed rate 20; CRP <0.5 08-30-15: hgb a1c 6.2  10-02-15: wbc 11.4; hgb 10.9; hct 33.1; mcv 93.2 ;plt 170 10-12-15: wbc 27.0; hgb 11.8; hct 34.9; mcv 92.6; plt 155; glucose 286; bun 35; create 2.50; k+ 4.2; na++ 134; mag 1.9  10-27-15: glucose 195; bun 36; creat 1.81; k+ 4.2; na++ 142  11-29-15: chol 106; ldl 58; trig 108; hdl 26; hgb a1c 5.9  01-05-16: vit B 12: 358 02-02-16: wbc 9.6; hgb 11.9; hct 35.9; mcv 95.3; plt 123; glucose 101; bun 60.2; creat 2.56; k+ 3.9; na++ 152; liver normal albumin 4.1  02-07-16: glucose 106; bun 45.2; creat 2.06; k+ 3.6; na++ 143  02-22-16: wbc 11.9; hgb 11.5; hct 34.4; mcv 93.7; plt 157; glucose 144; bun 55.5; creat 2.69; k+ 4.9; na++ 146; liver normal albumin 4.0  02-28-16: wbc 9.5; hgb 11.7; hct 34.4 ;mcv 92.8; plt 143; glucose 89; bun 35.2; creat 1.60; k+ 3.8; na++ 142; liver normal albumin 3.8  05-04-16: urine microalbumin 16.7  05-07-16: chol 131; ldl 72; trig  200; hdl 19; hgb a1c 5.9     Review of Systems Constitutional: Negative for appetite change and fatigue.  HENT: no congestion  Respiratory: Negative for cough, chest tightness and shortness of breath.   Cardiovascular: Negative for chest pain, palpitations and leg swelling.  Gastrointestinal: Negative for nausea abdominal pain Musculoskeletal: Negative for myalgias and arthralgias.  Skin: no complaints  Neurological: Negative for dizziness.  Psychiatric/Behavioral: The patient is not nervous/anxious.       Physical Exam Constitutional: No  distress.  Overweight   Eyes: Conjunctivae are normal.  Neck: Neck supple. No JVD present. No thyromegaly present.  Cardiovascular: Normal rate, regular rhythm and intact distal pulses.   Respiratory: Effort normal and breath sounds normal. No respiratory distress. He has no wheezes.  GI: Soft. Bowel sounds are normal no tenderness present Musculoskeletal: He exhibits no edema.  Able to move all extremities   Lymphadenopathy:    He has no cervical adenopathy.  Neurological: He is alert.  Skin: warm and dray  Psychiatric: He has a normal mood and affect.     ASSESSMENT/ PLAN:   1. Hypertension: will continue lopressor 50 mg twice daily; norvasc 10 mg daily asa 81 mg daily;  lisinopril  10 mg daily  clonidine 0.1 mg in the AM and 0.2 in the PM will monitor   2. Chronic diastolic heart failure: EF 60% (2011)  will continue lasix 40  mg twice daily lopressor 50 mg twice daily imdur 60 mg daily   3. Constipation: will continue miralax twice daily; senna 2 tabs twice daily colace twice daily     4. Hypokalemia will continue  k+ 20 meq twice daily k+ is 4.2  5. GERD: will continue pepcid 20 mg twice daily    6. Dyslipidemia: will continue tricor 145 mg daily; is not on  lipitor; ldl is 72; trig is 200  7. Diabetes:  hgb a1c is 5.9 ; will stop novolog and will reduce lantus to 15 units nightly   8. Depression with anxiety will continue wellbutrin  75 mg twice daily; ativan 0.25 mg daily prior to AM care as needed   for anxiety and will monitor  9. Vascular dementia: is without change; will continue namenda xr 14 mg daily and will monitor   10. Chronic pain: his pain is presently being managed will continue neurontin 100 mg twice daily; lidoderm patch to his right neck daily and left hip; voltaren gel 2 gm to right wrist three times daily  oxycontin  20 mg twice daily  12. Glaucoma; will continue latanoprost to both eyes nightly and cosopt to both eyes twice daily .   13. Anemia: his  hgb is 11.8; will monitor   14. Diabetic peripheral neuropathy: will continue neurontin 100 mg twice daily   15: CKD stage IV: bun/creat 35.2/1.60; will monitor   16. Allergic rhinitis: will continue flonase daily         MD is aware of resident's narcotic use and is in agreement with current plan of care. We will attempt to wean resident as apropriate   Synthia Innocent NP [facility]  Contact [phone] Monday through Friday 8am- 5pm  After hours call [phone]

## 2016-06-10 ENCOUNTER — Non-Acute Institutional Stay (SKILLED_NURSING_FACILITY): Payer: Medicare Other | Admitting: Adult Health

## 2016-06-10 ENCOUNTER — Encounter: Payer: Self-pay | Admitting: Adult Health

## 2016-06-10 DIAGNOSIS — F418 Other specified anxiety disorders: Secondary | ICD-10-CM | POA: Diagnosis not present

## 2016-06-10 DIAGNOSIS — G894 Chronic pain syndrome: Secondary | ICD-10-CM

## 2016-06-10 DIAGNOSIS — F01518 Vascular dementia, unspecified severity, with other behavioral disturbance: Secondary | ICD-10-CM

## 2016-06-10 DIAGNOSIS — F0151 Vascular dementia with behavioral disturbance: Secondary | ICD-10-CM

## 2016-06-10 NOTE — Progress Notes (Signed)
Location:   [facility] Room Number: 115 A Place of Service:  SNF (31)   CODE STATUS: DNR  No Known Allergies  Chief Complaint  Patient presents with  . Acute Visit    Behavior Issues    HPI:  Today he has been very agitated; swinging at staff. He denies pain today. He did not take his medications this AM. He will not willingly participate in the hpi or ros. I did order 0.5 mg IM ativan one time; but he has since calmed enough not to require the injection.    Past Medical History:  Diagnosis Date  . 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 Anxiety   . At high risk for falls   . Chronic diastolic congestive heart failure (HCC) 06/13/2012   Echo in 2011 with grade 1 diastolic dysfunction and normal systolic function with EF 60%.     . DDD (degenerative disc disease), lumbosacral   . Depression   . Depression with anxiety 11/27/2013  . Diabetic peripheral neuropathy (HCC)   . Dyslipidemia   . GERD (gastroesophageal reflux disease)   . History of colon polyps   . History of diabetic ulcer of foot   . History of MRSA infection    Hx chronic diabetic ulcer's secondary MRSA  . Hypertension   . Insulin dependent type 2 diabetes mellitus (HCC)   . Peripheral vascular disease (HCC)   . Portal hypertension (HCC) 01/15/2010   Qualifier: Diagnosis of  By: Everardo All MD, Cleophas Dunker   . Renal insufficiency   . Ulcer of right ankle (HCC)   . UNSPECIFIED PERIPHERAL VASCULAR DISEASE 01/15/2010   Qualifier: Diagnosis of  By: Everardo All MD, Cleophas Dunker   . Wheelchair dependent     Past Surgical History:  Procedure Laterality Date  . COLONOSCOPY WITH ESOPHAGOGASTRODUODENOSCOPY (EGD)  09-21-2002  . INCISION AND DRAINAGE OF WOUND Right 01/10/2014   Procedure: IRRIGATION AND DEBRIDEMENT RIGHT ANKLE WOUND WITH PLACEMENT OF ACELL;  Surgeon: Wayland Denis, DO;  Location: [facility];  Service: Plastics;  Laterality: Right;  . INGUINAL HERNIA REPAIR Left 11-30-2001  .  LAPAROSCOPIC CHOLECYSTECTOMY    . TOE AMPUTATION    . TRANSTHORACIC ECHOCARDIOGRAM  12-20-2009   severe LVH/  ef 60%/  grade I diastolic dysfunction/  AV sclerosis without stenosis/  mild LAE and RAE/      Social History   Social History  . Marital status: Married    Spouse name: N/A  . Number of children: N/A  . Years of education: N/A   Occupational History  .  Retired    Retired   Social History Main Topics  . Smoking status: Former Smoker    Types: Cigarettes  . Smokeless tobacco: Never Used  . Alcohol use No     Comment: hx alcohol abuse  in remission  . Drug use: No  . Sexual activity: No   Other Topics Concern  . Not on file   Social History Narrative   Lives at [facility]   Family History  Problem Relation Age of Onset  . Diabetes Neg Hx     No DM in immediate family      VITAL SIGNS BP (!) 147/71   Pulse 71   Temp 98.3 F (36.8 C)   Resp 18   Ht  (1.778 m)   Wt 202 lb 3.2 oz (91.7 kg)   SpO2 96%   BMI 29.01 kg/m   Patient's Medications  New  Prescriptions   No medications on file  Previous Medications   ACETAMINOPHEN (TYLENOL) 325 MG TABLET    Take 2 tablets (650 mg total) by mouth every 6 (six) hours as needed for mild pain.   AMINO ACIDS-PROTEIN HYDROLYS (FEEDING SUPPLEMENT, PRO-STAT SUGAR FREE 64,) LIQD    Take 30 mLs by mouth 2 (two) times daily.   AMLODIPINE (NORVASC) 10 MG TABLET    Take 10 mg by mouth daily.   ASCORBIC ACID (VITAMIN C) 500 MG TABLET    Take 500 mg by mouth 2 (two) times daily.   ASPIRIN 81 MG TABLET    Take 81 mg by mouth every morning.    BISMUTH TRIBROMOPH-PETROLATUM EX    Apply topically. Apply to right lower leg topically every 3 days   BUPROPION (WELLBUTRIN) 75 MG TABLET    Take 75 mg by mouth 2 (two) times daily.    CARBOMER GEL BASE (HYDROGEL) GEL    by Does not apply route. Apply to Left first toe topically daily for wound care   CLONIDINE (CATAPRES) 0.1 MG TABLET    Give 0.2 mg by mouth every  morning (0800) and 0.1 mg by mouth every evening (1800)   COAL TAR (NEUTROGENA T-GEL) 0.5 % SHAMPOO    Apply topically every Wednesday and Saturday weekly as needed for [place] as resident allows   DICLOFENAC SODIUM (VOLTAREN) 1 % GEL    Apply 2 g topically 3 (three) times daily. To right wrist   DOCUSATE SODIUM (COLACE) 100 MG CAPSULE    Take 1 capsule (100 mg total) by mouth 2 (two) times daily.   DORZOLAMIDE-TIMOLOL (COSOPT) 22.3-6.8 MG/ML OPHTHALMIC SOLUTION    Place 1 drop into both eyes 2 (two) times daily.   FAMOTIDINE (PEPCID) 20 MG TABLET    Take 20 mg by mouth 2 (two) times daily.   FENOFIBRATE (TRICOR) 145 MG TABLET    Take 145 mg by mouth every evening.    FLUTICASONE (FLONASE) 50 MCG/ACT NASAL SPRAY    Place 2 sprays into both nostrils daily.   FUROSEMIDE (LASIX) 40 MG TABLET    Take 40 mg by mouth 2 (two) times daily.   GABAPENTIN (NEURONTIN) 100 MG CAPSULE    Take 100 mg by mouth 2 (two) times daily.   INSULIN GLARGINE (LANTUS) 100 UNIT/ML INJECTION    Inject 15 Units into the skin at bedtime.    ISOSORBIDE MONONITRATE (IMDUR) 60 MG 24 HR TABLET    Take 60 mg by mouth every morning.    LATANOPROST (XALATAN) 0.005 % OPHTHALMIC SOLUTION    Place 1 drop into both eyes at bedtime.     LIDOCAINE (LIDODERM) 5 %    Place 1 patch onto the skin every 12 (twelve) hours. Apply 1 patch topically to right neck and left hip daily in the PM and remove in the AM . Remove & Discard patch within 12 hours   LISINOPRIL (PRINIVIL,ZESTRIL) 10 MG TABLET    Take 10 mg by mouth daily.    LORAZEPAM (ATIVAN) 0.5 MG TABLET    Take 0.5 mg by mouth daily as needed for anxiety. Give 1/2 tab (0.25 mg) every morning as needed.   MEMANTINE HCL ER (NAMENDA XR) 14 MG CP24    Take 1 tablet by mouth every morning.   METOPROLOL (LOPRESSOR) 50 MG TABLET    Take 50 mg by mouth 2 (two) times daily.   NUTRITIONAL SUPPLEMENTS (NUTRITIONAL SUPPLEMENT PO)    Take by mouth. HSG Mech Soft  Texture, for diet   NUTRITIONAL  SUPPLEMENTS (NUTRITIONAL SUPPLEMENT PO)    Take by mouth. House Shake two times daily at lunch and dinner meals   NYSTATIN (MYCOSTATIN/NYSTOP) POWDER    Apply topically as needed.    ONDANSETRON (ZOFRAN) 4 MG TABLET    Take 4 mg by mouth every 8 (eight) hours as needed for nausea or vomiting.   OXYCODONE (OXYCONTIN) 20 MG 12 HR TABLET    Take 20 mg by mouth every 12 (twelve) hours.   POLYETHYLENE GLYCOL POWDER (GLYCOLAX/MIRALAX) POWDER    Take 17 g by mouth daily.    POTASSIUM CHLORIDE (KLOR-CON) 20 MEQ PACKET    Take 20 mEq by mouth 2 (two) times daily.   POVIDONE-IODINE (BETADINE EX)    Apply topically. Apply to right first toe topically daily for wound care   SACCHAROMYCES BOULARDII PO    Take 1 capsule by mouth 2 (two) times daily.   SENNA (SENOKOT) 8.6 MG TABLET    Take 2 tablets by mouth 2 (two) times daily.    ZINC SULFATE 220 (50 ZN) MG CAPSULE    Take 220 mg by mouth daily.  Modified Medications   No medications on file  Discontinued Medications   DOXYCYCLINE (DORYX) 100 MG EC TABLET    Take 100 mg by mouth 2 (two) times daily.   INSULIN ASPART (NOVOLOG) 100 UNIT/ML INJECTION    Inject 5 Units into the skin 3 (three) times daily before meals. Inject as per sliding scale: if  0-150 =0 units; 151-450 = 8 units, subcutaneously before meals related to DM. Call MD for BS> 450   WOUND DRESSINGS GEL    Apply topically as needed for wound care. TheraHoney Gel -Apply to left 1st toe topically daily     SIGNIFICANT DIAGNOSTIC EXAMS  03-14-14: right ankle tib/fib mri: 1. 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 2. T2 hyperintense probable draining abscess deep to ulceration. 3. Ankle effusion. This is probably reactive. Septic arthritis is less likely. 4. Infectious tenosynovitis of the peroneal tendons. 5. No abscess in the proximal RIGHT leg.  10-12-15: ct of head and cervical spine: No acute abnormality head or cervical spine. Advanced  multilevel spondylosis with postoperative change of decompression C4-C6 identified. Bilateral pleural effusions appear small but are incompletely seen. Extensive atherosclerosis.  10-12-15: right knee x-ray: No acute bony or joint abnormality is seen. Degenerative change is present about the knee. No joint effusion. Atherosclerosis noted.  10-12-15: right wrist x-ray: Moderately displaced and comminuted distal right radial fracture is noted.  10-12-15: right hip and pelvic x-ray: Displaced fracture of the right femoral neck.  02-01-16: chest x-ray; no acute cardiopulmonary disease.   02-23-16: chest x-ray: no acute cardiopulmonary changes  02-29-16: kub: findings [place] reflect fecal retention or decreased colonic motility. Ileus or obstruction not excluded definitively   05-17-16: chest x-ray: no acute cardiopulmonary disease seen. The findings are unchanged form 02-23-16.    LABS REVIEWED:   06-14-15; sed rate 20; CRP <0.5 08-30-15: hgb a1c 6.2  10-02-15: wbc 11.4; hgb 10.9; hct 33.1; mcv 93.2 ;plt 170 10-12-15: wbc 27.0; hgb 11.8; hct 34.9; mcv 92.6; plt 155; glucose 286; bun 35; create 2.50; k+ 4.2; na++ 134; mag 1.9  10-27-15: glucose 195; bun 36; creat 1.81; k+ 4.2; na++ 142  11-29-15: chol 106; ldl 58; trig 108; hdl 26; hgb a1c 5.9  01-05-16: vit B 12: 358 02-02-16: wbc 9.6; hgb 11.9; hct 35.9; mcv 95.3; plt 123; glucose  101; bun 60.2; creat 2.56; k+ 3.9; na++ 152; liver normal albumin 4.1  02-07-16: glucose 106; bun 45.2; creat 2.06; k+ 3.6; na++ 143  02-22-16: wbc 11.9; hgb 11.5; hct 34.4; mcv 93.7; plt 157; glucose 144; bun 55.5; creat 2.69; k+ 4.9; na++ 146; liver normal albumin 4.0  02-28-16: wbc 9.5; hgb 11.7; hct 34.4 ;mcv 92.8; plt 143; glucose 89; bun 35.2; creat 1.60; k+ 3.8; na++ 142; liver normal albumin 3.8  05-04-16: urine microalbumin 16.7  05-07-16: chol 131; ldl 72; trig 200; hdl 19; hgb a1c 5.9    Review of Systems  Unable to perform ROS: Other (declined )      Physical  Exam Constitutional: No distress.  Overweight   Eyes: Conjunctivae are normal.  Neck: Neck supple. No JVD present. No thyromegaly present.  Cardiovascular: Normal rate, regular rhythm and intact distal pulses.   Respiratory: Effort normal and breath sounds normal. No respiratory distress. He has no wheezes.  GI: Soft. Bowel sounds are normal no tenderness present Musculoskeletal: He exhibits no edema.  Able to move all extremities   Lymphadenopathy:    He has no cervical adenopathy.  Neurological: He is alert.  Skin: warm and dray  Psychiatric: He has a normal mood and affect.     ASSESSMENT/ PLAN:  1. Depression with anxiety will continue wellbutrin  75 mg twice daily;  Will change the ativan to 0.25 mg daily and will monitor his status.   2. Vascular dementia: is without change; will continue namenda xr 14 mg daily and will monitor   3. Chronic pain: his pain is presently being managed will continue neurontin 100 mg twice daily; lidoderm patch to his right neck daily and left hip; voltaren gel 2 gm to right wrist three times daily  oxycontin  20 mg twice daily   MD is aware of resident's narcotic use and is in agreement with current plan of care. We will attempt to wean resident as apropriate     Synthia Innocent NP [facility] Adult Medicine  Contact [phone] Monday through Friday 8am- 5pm  After hours call [phone]

## 2016-07-04 ENCOUNTER — Non-Acute Institutional Stay (SKILLED_NURSING_FACILITY): Payer: Medicare Other | Admitting: Adult Health

## 2016-07-04 ENCOUNTER — Encounter: Payer: Self-pay | Admitting: Adult Health

## 2016-07-04 DIAGNOSIS — F01518 Vascular dementia, unspecified severity, with other behavioral disturbance: Secondary | ICD-10-CM

## 2016-07-04 DIAGNOSIS — I5032 Chronic diastolic (congestive) heart failure: Secondary | ICD-10-CM | POA: Diagnosis not present

## 2016-07-04 DIAGNOSIS — E1149 Type 2 diabetes mellitus with other diabetic neurological complication: Secondary | ICD-10-CM | POA: Diagnosis not present

## 2016-07-04 DIAGNOSIS — I110 Hypertensive heart disease with heart failure: Secondary | ICD-10-CM

## 2016-07-04 DIAGNOSIS — N184 Chronic kidney disease, stage 4 (severe): Secondary | ICD-10-CM

## 2016-07-04 DIAGNOSIS — E1142 Type 2 diabetes mellitus with diabetic polyneuropathy: Secondary | ICD-10-CM | POA: Diagnosis not present

## 2016-07-04 DIAGNOSIS — E785 Hyperlipidemia, unspecified: Secondary | ICD-10-CM

## 2016-07-04 DIAGNOSIS — F0151 Vascular dementia with behavioral disturbance: Secondary | ICD-10-CM

## 2016-07-04 DIAGNOSIS — E1169 Type 2 diabetes mellitus with other specified complication: Secondary | ICD-10-CM
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
## 2016-07-23 ENCOUNTER — Emergency Department (HOSPITAL_COMMUNITY): Payer: Medicare Other

## 2016-07-23 ENCOUNTER — Encounter (HOSPITAL_COMMUNITY): Payer: Self-pay | Admitting: Emergency Medicine

## 2016-07-23 ENCOUNTER — Emergency Department (HOSPITAL_COMMUNITY)
Admission: EM | Admit: 2016-07-23 | Discharge: 2016-07-24 | Disposition: A | Discharge status: Skilled Nursing Facility | Payer: Medicare Other | Attending: Emergency Medicine | Admitting: Emergency Medicine

## 2016-07-23 DIAGNOSIS — E1140 Type 2 diabetes mellitus with diabetic neuropathy, unspecified: Secondary | ICD-10-CM | POA: Diagnosis not present

## 2016-07-23 DIAGNOSIS — R930 Abnormal findings on diagnostic imaging of skull and head, not elsewhere classified: Secondary | ICD-10-CM | POA: Diagnosis not present

## 2016-07-23 DIAGNOSIS — Z7982 Long term (current) use of aspirin: Secondary | ICD-10-CM | POA: Insufficient documentation

## 2016-07-23 DIAGNOSIS — N184 Chronic kidney disease, stage 4 (severe): Secondary | ICD-10-CM | POA: Diagnosis not present

## 2016-07-23 DIAGNOSIS — I5032 Chronic diastolic (congestive) heart failure: Secondary | ICD-10-CM | POA: Diagnosis not present

## 2016-07-23 DIAGNOSIS — Z79899 Other long term (current) drug therapy: Secondary | ICD-10-CM | POA: Insufficient documentation

## 2016-07-23 DIAGNOSIS — Z794 Long term (current) use of insulin: Secondary | ICD-10-CM | POA: Diagnosis not present

## 2016-07-23 DIAGNOSIS — E1122 Type 2 diabetes mellitus with diabetic chronic kidney disease: Secondary | ICD-10-CM | POA: Insufficient documentation

## 2016-07-23 DIAGNOSIS — Z87891 Personal history of nicotine dependence: Secondary | ICD-10-CM | POA: Diagnosis not present

## 2016-07-23 DIAGNOSIS — I130 Hypertensive heart and chronic kidney disease with heart failure and stage 1 through stage 4 chronic kidney disease, or unspecified chronic kidney disease: Secondary | ICD-10-CM | POA: Insufficient documentation

## 2016-07-23 DIAGNOSIS — R451 Restlessness and agitation: Secondary | ICD-10-CM | POA: Diagnosis not present

## 2016-07-23 LAB — URINALYSIS, ROUTINE W REFLEX MICROSCOPIC
Bacteria, UA: NONE SEEN
Bilirubin Urine: NEGATIVE
Glucose, UA: 150 mg/dL — AB
HGB URINE DIPSTICK: NEGATIVE
Ketones, ur: NEGATIVE mg/dL
LEUKOCYTES UA: NEGATIVE
Nitrite: NEGATIVE
PH: 5 (ref 5.0–8.0)
Protein, ur: 100 mg/dL — AB
RBC / HPF: NONE SEEN RBC/hpf (ref 0–5)
Specific Gravity, Urine: 1.016 (ref 1.005–1.030)

## 2016-07-23 LAB — BASIC METABOLIC PANEL
Anion gap: 6 (ref 5–15)
BUN: 24 mg/dL — AB (ref 6–20)
CO2: 24 mmol/L (ref 22–32)
Calcium: 10.2 mg/dL (ref 8.9–10.3)
Chloride: 109 mmol/L (ref 101–111)
Creatinine, Ser: 1.7 mg/dL — ABNORMAL HIGH (ref 0.61–1.24)
GFR calc non Af Amer: 35 mL/min — ABNORMAL LOW (ref >60)
GFR, EST AFRICAN AMERICAN: 41 mL/min — AB (ref >60)
Glucose, Bld: 181 mg/dL — ABNORMAL HIGH (ref 65–99)
POTASSIUM: 4.1 mmol/L (ref 3.5–5.1)
SODIUM: 139 mmol/L (ref 135–145)

## 2016-07-23 LAB — CBC WITH DIFFERENTIAL/PLATELET
BASOS PCT: 0 %
Basophils Absolute: 0 10*3/uL (ref 0.0–0.1)
Eosinophils Absolute: 0.3 10*3/uL (ref 0.0–0.7)
Eosinophils Relative: 3 %
HEMATOCRIT: 33.3 % — AB (ref 39.0–52.0)
Hemoglobin: 10.9 g/dL — ABNORMAL LOW (ref 13.0–17.0)
Lymphocytes Relative: 35 %
Lymphs Abs: 4 10*3/uL (ref 0.7–4.0)
MCH: 30.9 pg (ref 26.0–34.0)
MCHC: 32.7 g/dL (ref 30.0–36.0)
MCV: 94.3 fL (ref 78.0–100.0)
Monocytes Absolute: 0.7 10*3/uL (ref 0.1–1.0)
Monocytes Relative: 6 %
NEUTROS PCT: 56 %
Neutro Abs: 6.5 10*3/uL (ref 1.7–7.7)
Platelets: 152 10*3/uL (ref 150–400)
RBC: 3.53 MIL/uL — AB (ref 4.22–5.81)
RDW: 15.2 % (ref 11.5–15.5)
WBC: 11.5 10*3/uL — AB (ref 4.0–10.5)

## 2016-07-23 LAB — CBG MONITORING, ED: Glucose-Capillary: 160 mg/dL — ABNORMAL HIGH (ref 65–99)

## 2016-07-23 NOTE — ED Triage Notes (Signed)
Unable to assess fall risk; pt alert to self only

## 2016-07-23 NOTE — ED Notes (Signed)
[phone], Lynden Angathy, Daughter, called because she is unable to come here, and would like an update on the Pt
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
## 2016-07-23 NOTE — ED Notes (Signed)
Pt found sitting on edge of bed. Assisted back into bed and repositioned. Pt removed condom cath and voided in bed. Cleaned and removed remains of condom cath.

## 2016-07-23 NOTE — ED Provider Notes (Signed)
[facility]-EMERGENCY DEPT Provider Note   CSN: [id number] Arrival date & time: 07/23/16  2051     History   Chief Complaint No chief complaint on file.   HPI [name] is a 83 y.o. male who presents from [facility] rehabilitation center with complaints of combative behavior. Per nursing report, patient patient was becoming very frustrated with the nurse and tried to hit her in the face. When the nurse tried to stop patient he grabbed her arm and tried to scratch her. Per nursing rehabilitation center staff this is very abnormal behavior for him. He is usually very friendly and cooperative. Patient states that he was trying to stop the nurse from hitting him. Patient states that he has fallen several times over the last few days.  The history is provided by the patient and the nursing home. The history is limited by the condition of the patient.    Past Medical History:  Diagnosis Date  . 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 Anxiety   . At high risk for falls   . Chronic diastolic congestive heart failure (HCC) 06/13/2012   Echo in 2011 with grade 1 diastolic dysfunction and normal systolic function with EF 60%.     . DDD (degenerative disc disease), lumbosacral   . Depression   . Depression with anxiety 11/27/2013  . Diabetic peripheral neuropathy (HCC)   . Dyslipidemia   . GERD (gastroesophageal reflux disease)   . History of colon polyps   . History of diabetic ulcer of foot   . History of MRSA infection    Hx chronic diabetic ulcer's secondary MRSA  . Hypertension   . Insulin dependent type 2 diabetes mellitus (HCC)   . Peripheral vascular disease (HCC)   . Portal hypertension (HCC) 01/15/2010   Qualifier: Diagnosis of  By: Everardo All MD, Cleophas Dunker   . Renal insufficiency   . Ulcer of right ankle (HCC)   . UNSPECIFIED PERIPHERAL VASCULAR DISEASE 01/15/2010   Qualifier: Diagnosis of  By: Everardo All MD, Cleophas Dunker   . Wheelchair dependent     Patient Active Problem List   Diagnosis Date Noted  . Fall   . Closed right hip fracture (HCC) 10/12/2015  . Chronic kidney disease, stage 4, severely decreased GFR (HCC) 10/12/2015  . Right wrist fracture 10/12/2015  . Leukocytosis 10/12/2015  . Hypertensive heart disease with CHF (congestive heart failure) (HCC) 05/29/2015  . Stage II pressure ulcer of right ankle 03/19/2015  . Dyslipidemia associated with type 2 diabetes mellitus (HCC) 01/31/2015  . Osteoarthritis 08/15/2014  . Depression with anxiety 11/27/2013  . Constipation due to pain medication 07/18/2013  . Glaucoma 12/07/2012  . Type 2 diabetes mellitus with neurological manifestations, controlled (HCC) 11/13/2012  . Vascular dementia 10/29/2012  . Chronic pain 10/29/2012  . Chronic diastolic congestive heart failure (HCC) 06/13/2012  . Anemia 01/15/2010  . Diabetic peripheral neuropathy associated with type 2 diabetes mellitus (HCC) 01/15/2010  . GERD 01/15/2010    Past Surgical History:  Procedure Laterality Date  . COLONOSCOPY WITH ESOPHAGOGASTRODUODENOSCOPY (EGD)  09-21-2002  . INCISION AND DRAINAGE OF WOUND Right 01/10/2014   Procedure: IRRIGATION AND DEBRIDEMENT RIGHT ANKLE WOUND WITH PLACEMENT OF ACELL;  Surgeon: Wayland Denis, DO;  Location: [facility];  Service: Plastics;  Laterality: Right;  . INGUINAL HERNIA REPAIR Left 11-30-2001  . LAPAROSCOPIC CHOLECYSTECTOMY    . TOE AMPUTATION    . TRANSTHORACIC ECHOCARDIOGRAM  12-20-2009   severe LVH/  ef 60%/  grade I  diastolic dysfunction/  AV sclerosis without stenosis/  mild LAE and RAE/         Home Medications    Prior to Admission medications   Medication Sig Start Date End Date Taking? Authorizing Provider  acetaminophen (TYLENOL) 325 MG tablet Take 2 tablets (650 mg total) by mouth every 6 (six) hours as needed for mild pain. 10/14/15   Filbert Schilder, MD  Amino Acids-Protein Hydrolys (FEEDING SUPPLEMENT, PRO-STAT SUGAR FREE 64,) LIQD Take 30 mLs by mouth 2 (two)  times daily.    [provider]  amLODipine (NORVASC) 10 MG tablet Take 10 mg by mouth daily.    [provider]  ascorbic acid (VITAMIN C) 500 MG tablet Take 500 mg by mouth 2 (two) times daily.    [provider]  aspirin 81 MG tablet Take 81 mg by mouth every morning.     [provider]  cloNIDine (CATAPRES) 0.1 MG tablet Give 0.2 mg by mouth every morning (0800) and 0.1 mg by mouth every evening (1800)    [provider]  coal tar (NEUTROGENA T-GEL) 0.5 % shampoo Apply topically every Wednesday and Saturday weekly as needed for Dandruff as resident allows    [provider]  diclofenac sodium (VOLTAREN) 1 % GEL Apply 2 g topically 3 (three) times daily. To right wrist    [provider]  docusate sodium (COLACE) 100 MG capsule Take 1 capsule (100 mg total) by mouth 2 (two) times daily. 10/14/15   Filbert Schilder, MD  dorzolamide-timolol (COSOPT) 22.3-6.8 MG/ML ophthalmic solution Place 1 drop into both eyes 2 (two) times daily.    [provider]  famotidine (PEPCID) 20 MG tablet Take 20 mg by mouth 2 (two) times daily.    [provider]  fenofibrate (TRICOR) 145 MG tablet Take 145 mg by mouth every evening.     [provider]  fluticasone (FLONASE) 50 MCG/ACT nasal spray Place 2 sprays into both nostrils daily.    [provider]  furosemide (LASIX) 40 MG tablet Take 40 mg by mouth 2 (two) times daily.    [provider]  gabapentin (NEURONTIN) 100 MG capsule Take 100 mg by mouth 2 (two) times daily.    [provider]  insulin glargine (LANTUS) 100 UNIT/ML injection Inject 15 Units into the skin at bedtime.     [provider]  isosorbide mononitrate (IMDUR) 60 MG 24 hr tablet Take 60 mg by mouth every morning.     [provider]  latanoprost (XALATAN) 0.005 % ophthalmic solution Place 1 drop into both eyes at bedtime.      [provider]    lidocaine (LIDODERM) 5 % Place 1 patch onto the skin every 12 (twelve) hours. Apply 1 patch topically to right neck and left hip daily in the PM and remove in the AM . Remove & Discard patch within 12 hours    [provider]  lisinopril (PRINIVIL,ZESTRIL) 10 MG tablet Take 10 mg by mouth daily.     [provider]  LORazepam (ATIVAN) 0.5 MG tablet Take 0.5 mg by mouth daily as needed for anxiety. Give 1/2 tab (0.25 mg) every morning as needed.    [provider]  Memantine HCl ER (NAMENDA XR) 14 MG CP24 Take 1 tablet by mouth every morning.    [provider]  metoprolol (LOPRESSOR) 50 MG tablet Take 50 mg by mouth 2 (two) times daily.    [provider]  mirtazapine (REMERON) 7.5 MG tablet Take 7.5 mg by mouth at bedtime.    [provider]  Nutritional Supplements (NUTRITIONAL SUPPLEMENT PO) Take by mouth. HSG Mech Soft Texture, for diet    [provider]  Nutritional Supplements (NUTRITIONAL SUPPLEMENT PO) Take by mouth. House Shake two times daily at lunch and dinner meals    [provider]  nystatin (MYCOSTATIN/NYSTOP) powder Apply topically as needed.     [provider]  ondansetron (ZOFRAN) 4 MG tablet Take 4 mg by mouth every 8 (eight) hours as needed for nausea or vomiting.    [provider]  oxyCODONE (OXYCONTIN) 20 mg 12 hr tablet Take 20 mg by mouth every 12 (twelve) hours.    [provider]  polyethylene glycol powder (GLYCOLAX/MIRALAX) powder Take 17 g by mouth daily.     [provider]  potassium chloride (KLOR-CON) 20 MEQ packet Take 20 mEq by mouth 2 (two) times daily.    [provider]  SACCHAROMYCES BOULARDII PO Take 1 capsule by mouth 2 (two) times daily.    [provider]  senna (SENOKOT) 8.6 MG tablet Take 2 tablets by mouth 2 (two) times daily.     [provider]  zinc sulfate 220 (50 Zn) MG capsule Take 220 mg by mouth daily.     [provider]    Family History Family History  Problem Relation Age of Onset  . Diabetes Neg Hx        No DM in immediate family    Social History Social History  Substance Use Topics  . Smoking status: Former Smoker    Types: Cigarettes  . Smokeless tobacco: Never Used  . Alcohol use No     Comment: hx alcohol abuse  in remission     Allergies   Patient has no known allergies.   Review of Systems Review of Systems  Unable to perform ROS: Dementia     Physical Exam Updated Vital Signs BP (!) 167/83   Pulse (!) 54   Resp 18   SpO2 100%   Physical Exam  Constitutional: He appears well-developed and well-nourished.  Elderly and frail-appearing.  HENT:  Head: Normocephalic and atraumatic.  Mouth/Throat: Oropharynx is clear and moist and mucous membranes are normal.  Eyes: Conjunctivae, EOM and lids are normal. Pupils are equal, round, and reactive to light.  Neck: Full passive range of motion without pain.  Full flexion/extension and lateral movement of neck intact without difficulty. No midline tenderness.   Cardiovascular: Normal rate, regular rhythm, normal heart sounds and normal pulses.  Exam reveals no gallop and no friction rub.   No murmur heard. Pulmonary/Chest: Effort normal and breath sounds normal.  Abdominal: Soft. Normal appearance. There is no tenderness. There is no rigidity and no guarding.  Musculoskeletal: Normal range of motion.       Thoracic back: He exhibits no tenderness.       Lumbar back: He exhibits no tenderness.  Neurological: He is alert.  Moves all extremities spontaneously Follows commands 5/5 strength of left lower extremity. Difficulty moving right lower extremity secondary to pre-existing fracture and bandage in place. 5/5 strength bilateral upper extremity. CN III-XII intact He is alert and oriented to person and place. He is not oriented to time.  Skin: Skin is warm and dry. Capillary refill takes less than 2  seconds.  Multiple scattered areas of chronic ecchymosis to generalized body  Psychiatric: He has a normal mood and affect. His speech  is normal.  Nursing note and vitals reviewed.    ED Treatments / Results  Labs (all labs ordered are listed, but only abnormal results are displayed) Labs Reviewed  URINALYSIS, ROUTINE W REFLEX MICROSCOPIC - Abnormal; Notable for the following:       Result Value   Glucose, UA 150 (*)    Protein, ur 100 (*)    Squamous Epithelial / LPF 0-5 (*)    All other components within normal limits  BASIC METABOLIC PANEL - Abnormal; Notable for the following:    Glucose, Bld 181 (*)    BUN 24 (*)    Creatinine, Ser 1.70 (*)    GFR calc non Af Amer 35 (*)    GFR calc Af Amer 41 (*)    All other components within normal limits  CBC WITH DIFFERENTIAL/PLATELET - Abnormal; Notable for the following:    WBC 11.5 (*)    RBC 3.53 (*)    Hemoglobin 10.9 (*)    HCT 33.3 (*)    All other components within normal limits  CBG MONITORING, ED - Abnormal; Notable for the following:    Glucose-Capillary 160 (*)    All other components within normal limits    EKG  EKG Interpretation None       Radiology Dg Chest 2 View  Result Date: 07/23/2016 CLINICAL DATA:  83 y/o  M; status post fall. EXAM: CHEST  2 VIEW COMPARISON:  10/12/2015 chest radiograph FINDINGS: Normal cardiac silhouette. Aortic atherosclerosis with calcification. Clear lungs. No pleural effusion or pneumothorax. Multilevel degenerative changes of the spine. No acute osseous abnormality is evident. IMPRESSION: No acute pulmonary process.  Aortic atherosclerosis. Electronically Signed   By: Mitzi Hansen M.D.   On: 07/23/2016 22:08   Ct Head Wo Contrast  Result Date: 07/24/2016 CLINICAL DATA:  Acute onset of agitation and altered mental status. Initial encounter. EXAM: CT HEAD WITHOUT CONTRAST CT CERVICAL SPINE WITHOUT CONTRAST TECHNIQUE: Multidetector CT imaging of the head and cervical  spine was performed following the standard protocol without intravenous contrast. Multiplanar CT image reconstructions of the cervical spine were also generated. COMPARISON:  CT of the head and cervical spine performed 10/12/2015 FINDINGS: CT HEAD FINDINGS Brain: No evidence of acute infarction, hemorrhage, hydrocephalus, extra-axial collection or mass lesion/mass effect. Prominence of the ventricles and sulci reflects moderate cortical volume loss. Diffuse periventricular and subcortical white matter change likely reflects small vessel ischemic microangiopathy. Cerebellar atrophy is noted. The brainstem and fourth ventricle are within normal limits. The basal ganglia are unremarkable in appearance. The cerebral hemispheres demonstrate grossly normal gray-white differentiation. No mass effect or midline shift is seen. Vascular: No hyperdense vessel or unexpected calcification. Skull: There is no evidence of fracture; visualized osseous structures are unremarkable in appearance. Sinuses/Orbits: The orbits are within normal limits. The paranasal sinuses and mastoid air cells are well-aerated. Other: No significant soft tissue abnormalities are seen. CT CERVICAL SPINE FINDINGS Alignment: Normal. Skull base and vertebrae: No acute fracture. No primary bone lesion or focal pathologic process. Osseous fusion is noted at C5-C6. The patient is status post decompression at the mid to lower cervical spine. Soft tissues and spinal canal: No prevertebral fluid or swelling. No visible canal hematoma. Disc levels: Multilevel disc space narrowing is noted along the cervical spine, with multilevel vacuum phenomenon. Scattered anterior and posterior disc osteophyte complexes are noted along the cervical spine, with underlying facet disease. Mild degenerative change is noted about the dens. Upper chest: The thyroid gland is unremarkable in  appearance. The visualized lung bases are clear. Calcification is noted at the right carotid  bifurcation. Other: No additional soft tissue abnormalities are seen. IMPRESSION: 1. No evidence of traumatic intracranial injury or fracture. 2. No evidence of fracture or subluxation along the cervical spine. 3. Moderate cortical volume loss and diffuse small vessel ischemic microangiopathy. 4. Degenerative change along the cervical spine, with chronic osseous fusion at C5-C6, and decompression at the mid to lower cervical spine. 5. Calcification at the right carotid bifurcation. Carotid ultrasound would be helpful for further evaluation, when and as deemed clinically appropriate. Electronically Signed   By: Roanna Raider M.D.   On: 07/24/2016 00:15   Ct Cervical Spine Wo Contrast  Result Date: 07/24/2016 CLINICAL DATA:  Acute onset of agitation and altered mental status. Initial encounter. EXAM: CT HEAD WITHOUT CONTRAST CT CERVICAL SPINE WITHOUT CONTRAST TECHNIQUE: Multidetector CT imaging of the head and cervical spine was performed following the standard protocol without intravenous contrast. Multiplanar CT image reconstructions of the cervical spine were also generated. COMPARISON:  CT of the head and cervical spine performed 10/12/2015 FINDINGS: CT HEAD FINDINGS Brain: No evidence of acute infarction, hemorrhage, hydrocephalus, extra-axial collection or mass lesion/mass effect. Prominence of the ventricles and sulci reflects moderate cortical volume loss. Diffuse periventricular and subcortical white matter change likely reflects small vessel ischemic microangiopathy. Cerebellar atrophy is noted. The brainstem and fourth ventricle are within normal limits. The basal ganglia are unremarkable in appearance. The cerebral hemispheres demonstrate grossly normal gray-white differentiation. No mass effect or midline shift is seen. Vascular: No hyperdense vessel or unexpected calcification. Skull: There is no evidence of fracture; visualized osseous structures are unremarkable in appearance. Sinuses/Orbits: The  orbits are within normal limits. The paranasal sinuses and mastoid air cells are well-aerated. Other: No significant soft tissue abnormalities are seen. CT CERVICAL SPINE FINDINGS Alignment: Normal. Skull base and vertebrae: No acute fracture. No primary bone lesion or focal pathologic process. Osseous fusion is noted at C5-C6. The patient is status post decompression at the mid to lower cervical spine. Soft tissues and spinal canal: No prevertebral fluid or swelling. No visible canal hematoma. Disc levels: Multilevel disc space narrowing is noted along the cervical spine, with multilevel vacuum phenomenon. Scattered anterior and posterior disc osteophyte complexes are noted along the cervical spine, with underlying facet disease. Mild degenerative change is noted about the dens. Upper chest: The thyroid gland is unremarkable in appearance. The visualized lung bases are clear. Calcification is noted at the right carotid bifurcation. Other: No additional soft tissue abnormalities are seen. IMPRESSION: 1. No evidence of traumatic intracranial injury or fracture. 2. No evidence of fracture or subluxation along the cervical spine. 3. Moderate cortical volume loss and diffuse small vessel ischemic microangiopathy. 4. Degenerative change along the cervical spine, with chronic osseous fusion at C5-C6, and decompression at the mid to lower cervical spine. 5. Calcification at the right carotid bifurcation. Carotid ultrasound would be helpful for further evaluation, when and as deemed clinically appropriate. Electronically Signed   By: Roanna Raider M.D.   On: 07/24/2016 00:15    Procedures Procedures (including critical care time)  Medications Ordered in ED Medications - No data to display   Initial Impression / Assessment and Plan / ED Course  I have reviewed the triage vital signs and the nursing notes.  Pertinent labs & imaging results that were available during my care of the patient were reviewed by me and  considered in my medical decision making (see chart for  details).     83 year old [facility] with concerns of combative behavior. Consider acute infectious etiology such as pneumonia or UTI versus CVA versus hypoglycemia. Will plan to evaluate CT head and CT C-spine since patient reports recent history of falls. We'll also check basic labs including CBC, BMP, UA, CBG. Will evaluate chest x-ray for pneumonia.  11:37 PM: [facility] with [facility]. Patient does have a baseline history of mental cognitive issues. She reports that earlier this evening patient was refusing his dinner and refusing his pain medications. She also notes that patient was pulling at the wires connected to the TV. When the nurse attempted to try and give him his medications he grabbed her arm and started scratching at her. The nurse then reports that he attempted to sit on a table and move himself around. At one point he is not mad at the staff and barricaded himself in his room. Nurse reports that he sometimes experiences some episodes of agitation with the same symptoms but usually is able to calm down after a while. She reports that tonight episode of agitation was a little bit worse and lasted a little bit longer prompting EMS call.  Other than episode of agitation, patient has been at his normal mental baseline.She denies any recent trauma or falls.  Labs and imaging reviewed. CT head and CT C-spine reviewed. Negative for any acute intracranial abnormality or acute C-spine fracture. Chest x-ray negative for any acute infectious process. CBC with mild elevation of white blood cell count. BMP with slight elevation of BUN/creatinine but compared to previous records this actually improved since last time. POC glucose is 116 in the department. UA is negative for any acute signs of infection. EKG shows normal sinus rhythm rate 68.   Re-evaluation: Patient is  sleeping complained emergency department. He is easily aroused and conversive. Patient has been cooperative and agreeable department. He has not had any episodes of agitation and has not been combative with any of the staff here in the department. Since he has been at his normal baseline and given unremarkable workup will plan to send him home to nursing facility. Patient instructed in paperwork to follow-up with his primary care doctor the next 24-48 hours for further evaluation. Should return precautions discussed.   Final Clinical Impressions(s) / ED Diagnoses   Final diagnoses:  Agitation    New Prescriptions New Prescriptions   No medications on file     Rosana HoesLayden, Tonio Seider A, PA-C 07/24/16 0047    Maxwell CaulLayden, Sequoyah Counterman A, PA-C 07/24/16 0222    Tegeler, Canary Brimhristopher J, MD April 18, 2016 1249

## 2016-07-23 NOTE — ED Notes (Signed)
CBG 160 

## 2016-07-24 ENCOUNTER — Emergency Department (HOSPITAL_COMMUNITY): Payer: Medicare Other

## 2016-07-24 DIAGNOSIS — R451 Restlessness and agitation: Secondary | ICD-10-CM | POA: Diagnosis not present

## 2016-07-24 NOTE — Discharge Instructions (Signed)
Follow-up with his primary care doctor next 24-48 hours for further evaluation.   Return to the emergency department for any worsening symptoms, fever, confusion, pain, difficulty breathing or any other worsening or concerning symptoms.

## 2016-07-29 ENCOUNTER — Inpatient Hospital Stay (HOSPITAL_COMMUNITY)
Admission: EM | Admit: 2016-07-29 | Discharge: 2016-08-02 | DRG: 872 | Disposition: E | Payer: Medicare Other | Attending: Internal Medicine | Admitting: Internal Medicine

## 2016-07-29 ENCOUNTER — Encounter (HOSPITAL_COMMUNITY): Payer: Self-pay | Admitting: Emergency Medicine

## 2016-07-29 ENCOUNTER — Emergency Department (HOSPITAL_COMMUNITY): Payer: Medicare Other

## 2016-07-29 DIAGNOSIS — A419 Sepsis, unspecified organism: Principal | ICD-10-CM | POA: Diagnosis present

## 2016-07-29 DIAGNOSIS — Z993 Dependence on wheelchair: Secondary | ICD-10-CM

## 2016-07-29 DIAGNOSIS — N179 Acute kidney failure, unspecified: Secondary | ICD-10-CM | POA: Diagnosis present

## 2016-07-29 DIAGNOSIS — K460 Unspecified abdominal hernia with obstruction, without gangrene: Secondary | ICD-10-CM

## 2016-07-29 DIAGNOSIS — Z7982 Long term (current) use of aspirin: Secondary | ICD-10-CM

## 2016-07-29 DIAGNOSIS — M5137 Other intervertebral disc degeneration, lumbosacral region: Secondary | ICD-10-CM | POA: Diagnosis present

## 2016-07-29 DIAGNOSIS — K219 Gastro-esophageal reflux disease without esophagitis: Secondary | ICD-10-CM | POA: Diagnosis present

## 2016-07-29 DIAGNOSIS — Z794 Long term (current) use of insulin: Secondary | ICD-10-CM

## 2016-07-29 DIAGNOSIS — K56609 Unspecified intestinal obstruction, unspecified as to partial versus complete obstruction: Secondary | ICD-10-CM

## 2016-07-29 DIAGNOSIS — K4030 Unilateral inguinal hernia, with obstruction, without gangrene, not specified as recurrent: Secondary | ICD-10-CM | POA: Diagnosis present

## 2016-07-29 DIAGNOSIS — Z7189 Other specified counseling: Secondary | ICD-10-CM

## 2016-07-29 DIAGNOSIS — I130 Hypertensive heart and chronic kidney disease with heart failure and stage 1 through stage 4 chronic kidney disease, or unspecified chronic kidney disease: Secondary | ICD-10-CM | POA: Diagnosis present

## 2016-07-29 DIAGNOSIS — N184 Chronic kidney disease, stage 4 (severe): Secondary | ICD-10-CM | POA: Diagnosis present

## 2016-07-29 DIAGNOSIS — F0150 Vascular dementia without behavioral disturbance: Secondary | ICD-10-CM | POA: Diagnosis present

## 2016-07-29 DIAGNOSIS — Z9049 Acquired absence of other specified parts of digestive tract: Secondary | ICD-10-CM

## 2016-07-29 DIAGNOSIS — K766 Portal hypertension: Secondary | ICD-10-CM | POA: Diagnosis present

## 2016-07-29 DIAGNOSIS — R109 Unspecified abdominal pain: Secondary | ICD-10-CM | POA: Diagnosis not present

## 2016-07-29 DIAGNOSIS — E1151 Type 2 diabetes mellitus with diabetic peripheral angiopathy without gangrene: Secondary | ICD-10-CM | POA: Diagnosis present

## 2016-07-29 DIAGNOSIS — E1142 Type 2 diabetes mellitus with diabetic polyneuropathy: Secondary | ICD-10-CM | POA: Diagnosis present

## 2016-07-29 DIAGNOSIS — Z7951 Long term (current) use of inhaled steroids: Secondary | ICD-10-CM

## 2016-07-29 DIAGNOSIS — F1011 Alcohol abuse, in remission: Secondary | ICD-10-CM | POA: Diagnosis present

## 2016-07-29 DIAGNOSIS — K559 Vascular disorder of intestine, unspecified: Secondary | ICD-10-CM | POA: Diagnosis present

## 2016-07-29 DIAGNOSIS — Z515 Encounter for palliative care: Secondary | ICD-10-CM | POA: Diagnosis present

## 2016-07-29 DIAGNOSIS — Z66 Do not resuscitate: Secondary | ICD-10-CM | POA: Diagnosis present

## 2016-07-29 DIAGNOSIS — I5032 Chronic diastolic (congestive) heart failure: Secondary | ICD-10-CM | POA: Diagnosis present

## 2016-07-29 DIAGNOSIS — E785 Hyperlipidemia, unspecified: Secondary | ICD-10-CM | POA: Diagnosis present

## 2016-07-29 DIAGNOSIS — R6520 Severe sepsis without septic shock: Secondary | ICD-10-CM | POA: Diagnosis present

## 2016-07-29 DIAGNOSIS — E1122 Type 2 diabetes mellitus with diabetic chronic kidney disease: Secondary | ICD-10-CM | POA: Diagnosis present

## 2016-07-29 LAB — COMPREHENSIVE METABOLIC PANEL
ALBUMIN: 4 g/dL (ref 3.5–5.0)
ALT: 15 U/L — ABNORMAL LOW (ref 17–63)
ANION GAP: 21 — AB (ref 5–15)
AST: 34 U/L (ref 15–41)
Alkaline Phosphatase: 80 U/L (ref 38–126)
BUN: 44 mg/dL — ABNORMAL HIGH (ref 6–20)
CO2: 21 mmol/L — AB (ref 22–32)
Calcium: 10.4 mg/dL — ABNORMAL HIGH (ref 8.9–10.3)
Chloride: 100 mmol/L — ABNORMAL LOW (ref 101–111)
Creatinine, Ser: 3.67 mg/dL — ABNORMAL HIGH (ref 0.61–1.24)
GFR calc Af Amer: 16 mL/min — ABNORMAL LOW (ref >60)
GFR calc non Af Amer: 14 mL/min — ABNORMAL LOW (ref >60)
GLUCOSE: 306 mg/dL — AB (ref 65–99)
POTASSIUM: 4.7 mmol/L (ref 3.5–5.1)
SODIUM: 142 mmol/L (ref 135–145)
TOTAL PROTEIN: 7.6 g/dL (ref 6.5–8.1)
Total Bilirubin: 1.4 mg/dL — ABNORMAL HIGH (ref 0.3–1.2)

## 2016-07-29 LAB — LIPASE, BLOOD: LIPASE: 18 U/L (ref 11–51)

## 2016-07-29 LAB — I-STAT CG4 LACTIC ACID, ED: Lactic Acid, Venous: 5.23 mmol/L (ref 0.5–1.9)

## 2016-07-29 MED ORDER — PIPERACILLIN-TAZOBACTAM 3.375 G IVPB 30 MIN
3.3750 g | Freq: Once | INTRAVENOUS | Status: AC
  Administered 2016-07-29: 3.375 g via INTRAVENOUS
  Filled 2016-07-29: qty 50

## 2016-07-29 MED ORDER — SODIUM CHLORIDE 0.9 % IV BOLUS (SEPSIS)
2000.0000 mL | Freq: Once | INTRAVENOUS | Status: AC
Start: 2016-07-29 — End: 2016-07-30
  Administered 2016-07-29: 2000 mL via INTRAVENOUS

## 2016-07-29 MED ORDER — VANCOMYCIN HCL IN DEXTROSE 1-5 GM/200ML-% IV SOLN
1000.0000 mg | Freq: Once | INTRAVENOUS | Status: AC
  Administered 2016-07-29: 1000 mg via INTRAVENOUS
  Filled 2016-07-29: qty 200

## 2016-07-29 MED ORDER — SODIUM CHLORIDE 0.9 % IV BOLUS (SEPSIS)
1000.0000 mL | Freq: Once | INTRAVENOUS | Status: AC
  Administered 2016-07-29: 1000 mL via INTRAVENOUS

## 2016-07-29 NOTE — ED Provider Notes (Addendum)
[facility]-EMERGENCY DEPT Provider Note   CSN: [id number] Arrival date & time: 07/05/2016  2158     History   Chief Complaint Chief Complaint  Patient presents with  . Abdominal Pain    HPI [name] is a 83 y.o. male.  Level V Caveat: Dementia  [name] is a 83 y.o. Male with a history of dementia, CHF, hypertension, and diabetes who presented to the emergency department via EMS from his skilled nursing facility at [facility]. See nursing facility reports the patient is a decreased oral intake over the past 3 days as well as weight loss. X-rays obtained during the day today which showed small bowel loop dilation and possible ileus. SNF reports he vomited once today. Patient cannot cooperate with history due to dementia. He denies any complaints when asked.    The history is provided by medical records and the nursing home. No language interpreter was used.  Abdominal Pain      Past Medical History:  Diagnosis Date  . 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 Anxiety   . At high risk for falls   . Chronic diastolic congestive heart failure (HCC) 06/13/2012   Echo in 2011 with grade 1 diastolic dysfunction and normal systolic function with EF 60%.     . DDD (degenerative disc disease), lumbosacral   . Depression   . Depression with anxiety 11/27/2013  . Diabetic peripheral neuropathy (HCC)   . Dyslipidemia   . GERD (gastroesophageal reflux disease)   . History of colon polyps   . History of diabetic ulcer of foot   . History of MRSA infection    Hx chronic diabetic ulcer's secondary MRSA  . Hypertension   . Insulin dependent type 2 diabetes mellitus (HCC)   . Peripheral vascular disease (HCC)   . Portal hypertension (HCC) 01/15/2010   Qualifier: Diagnosis of  By: Everardo All MD, Cleophas Dunker   . Renal insufficiency   . Ulcer of right ankle (HCC)   . UNSPECIFIED PERIPHERAL VASCULAR DISEASE 01/15/2010   Qualifier: Diagnosis of  By: Everardo All MD, Cleophas Dunker   . Wheelchair dependent       Patient Active Problem List   Diagnosis Date Noted  . Admission for end of life care 07/30/2016  . Strangulated inguinal hernia 07/30/2016  . SBO (small bowel obstruction) (HCC) 07/30/2016  . Severe sepsis (HCC) 07/30/2016  . Fall   . Closed right hip fracture (HCC) 10/12/2015  . Chronic kidney disease, stage 4, severely decreased GFR (HCC) 10/12/2015  . Right wrist fracture 10/12/2015  . Leukocytosis 10/12/2015  . Hypertensive heart disease with CHF (congestive heart failure) (HCC) 05/29/2015  . Stage II pressure ulcer of right ankle 03/19/2015  . Dyslipidemia associated with type 2 diabetes mellitus (HCC) 01/31/2015  . Osteoarthritis 08/15/2014  . Depression with anxiety 11/27/2013  . Constipation due to pain medication 07/18/2013  . Glaucoma 12/07/2012  . Type 2 diabetes mellitus with neurological manifestations, controlled (HCC) 11/13/2012  . Vascular dementia 10/29/2012  . Chronic pain 10/29/2012  . Chronic diastolic congestive heart failure (HCC) 06/13/2012  . Anemia 01/15/2010  . Diabetic peripheral neuropathy associated with type 2 diabetes mellitus (HCC) 01/15/2010  . GERD 01/15/2010    Past Surgical History:  Procedure Laterality Date  . COLONOSCOPY WITH ESOPHAGOGASTRODUODENOSCOPY (EGD)  09-21-2002  . INCISION AND DRAINAGE OF WOUND Right 01/10/2014   Procedure: IRRIGATION AND DEBRIDEMENT RIGHT ANKLE WOUND WITH PLACEMENT OF ACELL;  Surgeon: Wayland Denis, DO;  Location: [facility];  Service: Government social research officer;  Laterality: Right;  . INGUINAL HERNIA REPAIR Left 11-30-2001  . LAPAROSCOPIC CHOLECYSTECTOMY    . TOE AMPUTATION    . TRANSTHORACIC ECHOCARDIOGRAM  12-20-2009   severe LVH/  ef 60%/  grade I diastolic dysfunction/  AV sclerosis without stenosis/  mild LAE and RAE/         Home Medications    Prior to Admission medications   Medication Sig Start Date End Date Taking? Authorizing Provider  acetaminophen (TYLENOL) 325 MG tablet Take 2 tablets  (650 mg total) by mouth every 6 (six) hours as needed for mild pain. 10/14/15  Yes Filbert Schilder, MD  Amino Acids-Protein Hydrolys (FEEDING SUPPLEMENT, PRO-STAT SUGAR FREE 64,) LIQD Take 30 mLs by mouth 2 (two) times daily.   Yes [provider]  amLODipine (NORVASC) 10 MG tablet Take 10 mg by mouth daily.   Yes [provider]  ascorbic acid (VITAMIN C) 500 MG tablet Take 500 mg by mouth 2 (two) times daily.   Yes [provider]  aspirin 81 MG tablet Take 81 mg by mouth every morning.    Yes [provider]  coal tar (NEUTROGENA T-GEL) 0.5 % shampoo Apply topically every Wednesday and Saturday weekly as needed for Dandruff as resident allows   Yes [provider]  diclofenac sodium (VOLTAREN) 1 % GEL Apply 2 g topically 3 (three) times daily. To right wrist   Yes [provider]  docusate sodium (COLACE) 100 MG capsule Take 1 capsule (100 mg total) by mouth 2 (two) times daily. 10/14/15  Yes Filbert Schilder, MD  dorzolamide-timolol (COSOPT) 22.3-6.8 MG/ML ophthalmic solution Place 1 drop into both eyes 2 (two) times daily.   Yes [provider]  famotidine (PEPCID) 20 MG tablet Take 20 mg by mouth 2 (two) times daily.   Yes [provider]  fluticasone (FLONASE) 50 MCG/ACT nasal spray Place 2 sprays into both nostrils daily.   Yes [provider]  furosemide (LASIX) 40 MG tablet Take 40 mg by mouth daily.    Yes [provider]  gabapentin (NEURONTIN) 100 MG capsule Take 100 mg by mouth 2 (two) times daily.   Yes [provider]  geriatric multivitamins-minerals (ELDERTONIC/GEVRABON) ELIX Take 15 mLs by mouth 2 (two) times daily.   Yes [provider]  insulin glargine (LANTUS) 100 UNIT/ML injection Inject 15 Units into the skin at bedtime.    Yes [provider]  isosorbide mononitrate (IMDUR) 60 MG 24 hr tablet Take 60 mg by mouth every morning.    Yes [provider]  latanoprost (XALATAN) 0.005 % ophthalmic solution Place 1 drop into both eyes at bedtime.     Yes [provider]  lidocaine (LIDODERM) 5 % Place 1 patch onto the skin every 12 (twelve) hours. Apply 1 patch topically to right neck and left hip daily in the PM and remove in the AM . Remove & Discard patch within 12 hours   Yes [provider]  lisinopril (PRINIVIL,ZESTRIL) 10 MG tablet Take 10 mg by mouth daily.    Yes [provider]  LORazepam (ATIVAN) 0.5 MG tablet Take 0.5 mg by mouth daily.    Yes [provider]  Memantine HCl ER (NAMENDA XR) 14 MG CP24 Take 1 tablet by mouth every morning.   Yes [provider]  metoprolol (LOPRESSOR) 50 MG tablet Take 50 mg by mouth 2 (two) times daily.   Yes [provider]  mirtazapine (REMERON)  7.5 MG tablet Take 7.5 mg by mouth at bedtime.   Yes [provider]  Nutritional Supplements (NUTRITIONAL SUPPLEMENT PO) Take by mouth. HSG Mech Soft Texture, for diet   Yes [provider]  Nutritional Supplements (NUTRITIONAL SUPPLEMENT PO) Take by mouth. House Shake two times daily at lunch and dinner meals   Yes [provider]  nystatin (MYCOSTATIN/NYSTOP) powder Apply 1 g topically daily as needed (rash).    Yes [provider]  ondansetron (ZOFRAN) 4 MG tablet Take 4 mg by mouth every 8 (eight) hours as needed for nausea or vomiting.   Yes [provider]  oxyCODONE (OXYCONTIN) 20 mg 12 hr tablet Take 20 mg by mouth every 12 (twelve) hours.   Yes [provider]  polyethylene glycol powder (GLYCOLAX/MIRALAX) powder Take 17 g by mouth daily.    Yes [provider]  potassium chloride (KLOR-CON) 20 MEQ packet Take 20 mEq by mouth 2 (two) times daily.   Yes [provider]  SACCHAROMYCES BOULARDII PO Take 1 capsule by mouth 2 (two) times daily.   Yes [provider]  senna (SENOKOT) 8.6 MG tablet Take 2 tablets  by mouth 2 (two) times daily.    Yes [provider]  zinc sulfate 220 (50 Zn) MG capsule Take 220 mg by mouth daily.   Yes [provider]  cloNIDine (CATAPRES) 0.1 MG tablet Take 0.1-0.2 mg by mouth 2 (two) times daily. Give 0.2 mg by mouth every morning (0800) and 0.1 mg by mouth every evening (1800)    [provider]    Family History Family History  Problem Relation Age of Onset  . Diabetes Neg Hx        No DM in immediate family    Social History Social History  Substance Use Topics  . Smoking status: Former Smoker    Types: Cigarettes  . Smokeless tobacco: Never Used  . Alcohol use No     Comment: hx alcohol abuse  in remission     Allergies   Patient has no known allergies.   Review of Systems Review of Systems  Unable to perform ROS: Dementia  Gastrointestinal: Positive for abdominal pain.     Physical Exam Updated Vital Signs BP (!) 165/88 (BP Location: Right Arm)   Pulse 88   Temp 98.4 F (36.9 C) (Axillary)   Resp (!) 22   Wt 85.2 kg (187 lb 14.4 oz)   SpO2 100%   BMI 26.96 kg/m   Physical Exam  Constitutional: He appears well-developed and well-nourished. No distress.  Fighting against me during my exam.   HENT:  Head: Normocephalic and atraumatic.  Mouth/Throat: Oropharynx is clear and moist.  Eyes: Pupils are equal, round, and reactive to light. Right eye exhibits discharge. Left eye exhibits discharge.  Matting yellow discharge noted bilaterally.   Neck: Neck supple.  Cardiovascular: Normal rate, regular rhythm, normal heart sounds and intact distal pulses.   HR is 100. Good capillary refill.   Pulmonary/Chest: Effort normal and breath sounds normal. No respiratory distress. He has no wheezes. He has no rales.  Abdominal: Soft. There is no tenderness. There is no guarding.  Abdomen is soft and nontender to palpation.   Genitourinary:  Genitourinary Comments: No GU rashes. Large hernia to his groin into his  scrotum. No crepitus. Not reducible. Unable to retract foreskin to see head of penis. Diaper is dry. No crepitus.   Musculoskeletal: He exhibits no edema.  Lymphadenopathy:  He has no cervical adenopathy.  Neurological: He is alert. He exhibits normal muscle tone. Coordination normal.  Alert and fighting against me during my exam. He repeats similar phrases during exam which do not relate to his visit today.   Skin: Skin is warm and dry. Capillary refill takes less than 2 seconds. No rash noted. He is not diaphoretic. No erythema. No pallor.  Psychiatric: He has a normal mood and affect.  Nursing note and vitals reviewed.    ED Treatments / Results  Labs (all labs ordered are listed, but only abnormal results are displayed) Labs Reviewed  COMPREHENSIVE METABOLIC PANEL - Abnormal; Notable for the following:       Result Value   Chloride 100 (*)    CO2 21 (*)    Glucose, Bld 306 (*)    BUN 44 (*)    Creatinine, Ser 3.67 (*)    Calcium 10.4 (*)    ALT 15 (*)    Total Bilirubin 1.4 (*)    GFR calc non Af Amer 14 (*)    GFR calc Af Amer 16 (*)    Anion gap 21 (*)    All other components within normal limits  CBC WITH DIFFERENTIAL/PLATELET - Abnormal; Notable for the following:    WBC 52.6 (*)    RDW 15.8 (*)    Neutro Abs 33.1 (*)    Lymphs Abs 17.4 (*)    Monocytes Absolute 2.1 (*)    All other components within normal limits  I-STAT CG4 LACTIC ACID, ED - Abnormal; Notable for the following:    Lactic Acid, Venous 5.23 (*)    All other components within normal limits  CULTURE, BLOOD (ROUTINE X 2)  CULTURE, BLOOD (ROUTINE X 2)  MRSA PCR SCREENING  LIPASE, BLOOD    EKG  EKG Interpretation  Date/Time:  Monday 08-02-16 23:05:26 EDT Ventricular Rate:  113 PR Interval:    QRS Duration: 80 QT Interval:  341 QTC Calculation: 468 R Axis:   5 Text Interpretation:  Sinus tachycardia Nonspecific repol abnormality, lateral leads Confirmed by Ross Marcus ([id number]) on  07/30/2016 12:24:02 AM       Radiology Ct Abdomen Pelvis Wo Contrast  Result Date: 07/30/2016 CLINICAL DATA:  83 year old male with abdominal pain. EXAM: CT ABDOMEN AND PELVIS WITHOUT CONTRAST TECHNIQUE: Multidetector CT imaging of the abdomen and pelvis was performed following the standard protocol without IV contrast. COMPARISON:  Abdominal radiograph dated 03/03/2010 and CT dated 02/21/2010 FINDINGS: Evaluation of this exam is limited in the absence of intravenous contrast. Lower chest: There are bibasilar dependent atelectatic changes of the lungs. The visualized lung bases are otherwise clear. Choose top-normal cardiac size. Coronary vascular calcification primarily involving the LAD. There is calcification of the mitral annulus. There is no intra-abdominal free air or free fluid. Hepatobiliary: There is slight irregularity of the hepatic contour. Correlation with clinical exam and liver function tests recommended to evaluate for cirrhosis. No intrahepatic biliary ductal dilatation. Cholecystectomy. Pancreas: Unremarkable. No pancreatic ductal dilatation or surrounding inflammatory changes. Spleen: Mildly enlarged measuring up to 15 cm in craniocaudal length. Adrenals/Urinary Tract: There is a 2.2 cm hypodense right adrenal nodule which is not well characterized on today's CT balloon was present on the CT of 2011 and most likely represents an adenoma. The left adrenal gland appears unremarkable. There is moderate bilateral renal parenchymal atrophy. There is no hydronephrosis or nephrolithiasis on either side. Multiple bilateral renal hypodense lesions measure up to 2.8 cm in the upper  pole of the right kidney. These lesions are not characterized on this CT but Bolanos represent cysts. Ultrasound or MRI [place] provide better characterisation. The visualized ureters appear unremarkable. The bladder is only partially distended. There is partial herniation of the urinary bladder into the right inguinal hernia.  Stomach/Bowel: The stomach is distended and contains air and fluid. There is no evidence of gastric outlet obstruction. Multiple dilated and fluid-filled loops of small bowel noted measuring up to 5 cm. There is herniation of a segment of distal small bowel into the right inguinal canal. There is inflammatory changes and mild dilatation of the herniated loop of bowel. There is dilatation of the loops of bowel proximal to the hernia. The distal small bowel distal to the right inguinal hernia are decompressed. There are small scattered sigmoid diverticula without active inflammatory changes. The appendix is normal. Vascular/Lymphatic: There is advanced aortoiliac atherosclerotic disease. The aorta is tortuous and ectatic measuring up to 2.3 cm distally. No portal venous gas identified. There is no adenopathy. A rim calcified 2.2 x 1.7 cm reniform structure in the left hemipelvis (series 3, image 72) likely represents a calcified lymph node. Reproductive: The prostate and seminal vesicles are grossly unremarkable. Other: None Musculoskeletal: There is advanced osteopenia and degenerative changes of the thoracolumbar spine. Grade 1 L1-L2 retrolisthesis with disc desiccation and vacuum phenomena at this level. Multilevel old-appearing compression deformity. L2 laminectomy as well as multiple posterior metallic interspinous device. There is age indeterminate, probable old displaced right femoral neck fracture with nonunion. Clinical correlation is recommended. No definite acute fracture. IMPRESSION: 1. Small-bowel obstruction secondary to herniation of an incarcerated loop of distal small bowel into the right inguinal canal. Clinical correlation and surgical consult is advised. 2. Partial herniation of the bladder into the right inguinal hernia. 3. Sigmoid diverticulosis without active inflammatory changes. 4. Atherosclerotic disease of the aorta. 5. Osteopenia with extensive degenerative changes of the spine. Age  indeterminate, old-appearing displaced right femoral neck fracture with nonunion. Clinical correlation is recommended. Electronically Signed   By: Elgie Collard M.D.   On: 07/30/2016 01:42   Dg Chest Port 1 View  Result Date: 08-01-2016 CLINICAL DATA:  83 year old male with shortness of breath. EXAM: PORTABLE CHEST 1 VIEW COMPARISON:  Chest radiograph dated 07/23/2016 FINDINGS: There is shallow inspiration. The lungs are clear. There is no pleural effusion or pneumothorax. Stable top-normal cardiac size. There is atherosclerotic calcification of the aorta. No acute osseous pathology. IMPRESSION: No active disease. Electronically Signed   By: Elgie Collard M.D.   On: 31-Lariviere-2018 23:21    Procedures Procedures (including critical care time)  CRITICAL CARE Performed by: Lawana Chambers   Total critical care time: 70 minutes  Critical care time was exclusive of separately billable procedures and treating other patients.  Critical care was necessary to treat or prevent imminent or life-threatening deterioration.  Critical care was time spent personally by me on the following activities: development of treatment plan with patient and/or surrogate as well as nursing, discussions with consultants, evaluation of patient's response to treatment, examination of patient, obtaining history from patient or surrogate, ordering and performing treatments and interventions, ordering and review of laboratory studies, ordering and review of radiographic studies, pulse oximetry and re-evaluation of patient's condition.  Medications Ordered in ED Medications  ondansetron (ZOFRAN-ODT) disintegrating tablet 4 mg (not administered)    Or  ondansetron (ZOFRAN) injection 4 mg (not administered)  LORazepam (ATIVAN) tablet 1 mg ( Oral See Alternative 07/30/16 0535)    Or  LORazepam (ATIVAN) 2 MG/ML concentrated solution 1 mg ( Sublingual See Alternative 07/30/16 0535)    Or  LORazepam (ATIVAN) injection 1  mg (1 mg Intravenous Given 07/30/16 0535)  acetaminophen (TYLENOL) tablet 650 mg (not administered)    Or  acetaminophen (TYLENOL) suppository 650 mg (not administered)  haloperidol (HALDOL) tablet 0.5 mg (not administered)    Or  haloperidol (HALDOL) 2 MG/ML solution 0.5 mg (not administered)    Or  haloperidol lactate (HALDOL) injection 0.5 mg (not administered)  glycopyrrolate (ROBINUL) tablet 1 mg (not administered)    Or  glycopyrrolate (ROBINUL) injection 0.2 mg (not administered)    Or  glycopyrrolate (ROBINUL) injection 0.2 mg (not administered)  antiseptic oral rinse (BIOTENE) solution 15 mL (not administered)  polyvinyl alcohol (LIQUIFILM TEARS) 1.4 % ophthalmic solution 1 drop (not administered)  morphine 250 mg in sodium chloride 0.9 % 250 mL (1 mg/mL) infusion (1 mg/hr Intravenous New Bag/Given 07/30/16 0546)  morphine bolus via infusion 1-4 mg (not administered)  0.9 %  sodium chloride infusion ( Intravenous New Bag/Given 07/30/16 0535)  sodium chloride 0.9 % bolus 1,000 mL (0 mLs Intravenous Stopped 07/30/16 0052)  sodium chloride 0.9 % bolus 2,000 mL (0 mLs Intravenous Stopped 07/30/16 0200)  vancomycin (VANCOCIN) IVPB 1000 mg/200 mL premix (0 mg Intravenous Stopped 07/30/16 0053)  piperacillin-tazobactam (ZOSYN) IVPB 3.375 g (0 g Intravenous Stopped 07/30/16 0025)  ondansetron (ZOFRAN) injection 4 mg (4 mg Intravenous Given 07/30/16 0002)  morphine 4 MG/ML injection 4 mg (4 mg Intravenous Given 07/30/16 0233)     Initial Impression / Assessment and Plan / ED Course  I have reviewed the triage vital signs and the nursing notes.  Pertinent labs & imaging results that were available during my care of the patient were reviewed by me and considered in my medical decision making (see chart for details).  Clinical Course as of Seth 29 0603  Mon Boggus 28, 2018  2333 I spoke with patient's wife. She reports she cannot come to the hospital tonight, but plans to come tomorrow. She  reports he would want fluids and antibiotics, but not CPR or intubation.   [WD]  2348 I spoke with SNF RN who reports he has never seen the patient with an inguinal hernia or hernia to his scrotum. He is not sure the last time the patient urinated.   [WD]    Clinical Course User Index [WD] Everlene Farrieransie, Taro Hidrogo, PA-C   This is a 83 y.o. Male with a history of dementia, CHF, hypertension, and diabetes who presented to the emergency department via EMS [facility]. See nursing facility reports the patient is a decreased oral intake over the past 3 days as well as weight loss. X-rays obtained during the day today which showed small bowel loop dilation and possible ileus. SNF reports he vomited once today. Patient cannot cooperate with history due to dementia. He denies any complaints when asked.  On exam the patient is febrile to 100.8. His abdomen is soft and he has a large hernia noted to his groin. I am unable to visualize the head of his penis. Diaper is dry. No crepitus or ecchymosis noted to his scrotum or his groin. No evidence of Fournier's. Patient is combative during the exam which makes this difficult. Code sepsis activated. Blood work ordered. Fluids ordered.  I called and spoke with patient's wife on the phone about the situation with her husband. She reports she will not come to the  emergency department at this time but plans to come in the morning. She reaffirms his DO NOT RESUSCITATE status and reports that he would not want to be intubated or have CPR performed. She does report he would like to have fluids and antibiotics.  Lactic acid is elevated 5.23. Patient with severe sepsis. 30 mL per Kg fluid bolus was ordered.   CMP reveals an elevated creatinine at 3.67. White count is elevated 52,000. Patient is awaiting CT abdomen and pelvis. He is next to go to CT scan. Chest x-ray is unremarkable. Unable to obtain urine at this time. HR improving with fluid  bolus. Blood pressure is stable. No hypotension.   CT scan without contrast shows a small bowel obstruction secondary to herniation of an incarcerated loop of distal small bowel into the right inguinal canal. Partial herniation of the bladder to the right inguinal canal present.  I called again and spoke with the patient's wife about this finding. I discussed that I would consult with general surgery, but they [facility] or [facility] not offer surgery. If they did offer surgery patient's wife would like [place] to try with surgery, otherwise she requests comfort care.  I called and consulted with general surgeon Dr. Lindie Spruce. He does not feel the patient would benefit from surgery and that he would likely die from this operation. This seems like a reasonable plan and appropriate. I consulted palliative care.  I called again and spoke with the patient's wife who is understanding. I again explained that he will likely succumb to this illness. She understands. She agrees with plan for comfort care.  I did attempt to reduce his inguinal hernia for pain relief after providing him with analgesia. I was unable to reduce the hernia at bedside.   I consulted for admission and with hospitalist Dr. Julian Reil. He accepted the patient for admission and plans for comfort care measures. I discussed findings as well as that I had spoken with the patient's wife and her phone number.   This patient was discussed with and evaluated by Dr. Wilkie Aye who agrees with assessment and plan.   Final Clinical Impressions(s) / ED Diagnoses   Final diagnoses:  Sepsis, due to unspecified organism [facility])  SBO (small bowel obstruction) (HCC)  Incarcerated hernia  AKI (acute kidney injury) [facility])    New Prescriptions Current Discharge Medication List       Lorene Dy 07/30/16 1610    Shon Baton, MD 07/30/16 0637    Everlene Farrier, PA-C 07/30/16 9604    Shon Baton, MD 08/03/16 5409    Shon Baton,  MD 08/03/16 [phone]

## 2016-07-29 NOTE — ED Notes (Signed)
Attempted to In and out cath, patient with large right inguinal hernia.  PA and MD in to see.  No urine sample needed at this time.  Unable to see, palpate penis.  PA and MD notified.

## 2016-07-29 NOTE — ED Triage Notes (Signed)
Patient is a [facility], has had decreased oral intake for the last 3 days and weight loss per staff.  Patient had xray done at some point during the day today, with results that show a modest small bowel loop dilatation.  Patient sent to ED by SNF.  Patient vomited x1 today at SNF, bile colored.  Patient denies any pain at this time.  Patient does have dementia.

## 2016-07-30 ENCOUNTER — Emergency Department (HOSPITAL_COMMUNITY): Payer: Medicare Other

## 2016-07-30 DIAGNOSIS — N179 Acute kidney failure, unspecified: Secondary | ICD-10-CM

## 2016-07-30 DIAGNOSIS — Z7189 Other specified counseling: Secondary | ICD-10-CM | POA: Diagnosis not present

## 2016-07-30 DIAGNOSIS — K4030 Unilateral inguinal hernia, with obstruction, without gangrene, not specified as recurrent: Secondary | ICD-10-CM | POA: Diagnosis present

## 2016-07-30 DIAGNOSIS — M5137 Other intervertebral disc degeneration, lumbosacral region: Secondary | ICD-10-CM | POA: Diagnosis present

## 2016-07-30 DIAGNOSIS — K460 Unspecified abdominal hernia with obstruction, without gangrene: Secondary | ICD-10-CM | POA: Diagnosis not present

## 2016-07-30 DIAGNOSIS — Z7982 Long term (current) use of aspirin: Secondary | ICD-10-CM | POA: Diagnosis not present

## 2016-07-30 DIAGNOSIS — F0151 Vascular dementia with behavioral disturbance: Secondary | ICD-10-CM | POA: Diagnosis not present

## 2016-07-30 DIAGNOSIS — I5032 Chronic diastolic (congestive) heart failure: Secondary | ICD-10-CM | POA: Diagnosis present

## 2016-07-30 DIAGNOSIS — Z515 Encounter for palliative care: Secondary | ICD-10-CM

## 2016-07-30 DIAGNOSIS — N184 Chronic kidney disease, stage 4 (severe): Secondary | ICD-10-CM | POA: Diagnosis present

## 2016-07-30 DIAGNOSIS — K559 Vascular disorder of intestine, unspecified: Secondary | ICD-10-CM | POA: Diagnosis present

## 2016-07-30 DIAGNOSIS — K219 Gastro-esophageal reflux disease without esophagitis: Secondary | ICD-10-CM | POA: Diagnosis present

## 2016-07-30 DIAGNOSIS — R109 Unspecified abdominal pain: Secondary | ICD-10-CM | POA: Diagnosis present

## 2016-07-30 DIAGNOSIS — Z66 Do not resuscitate: Secondary | ICD-10-CM | POA: Diagnosis present

## 2016-07-30 DIAGNOSIS — Z993 Dependence on wheelchair: Secondary | ICD-10-CM | POA: Diagnosis not present

## 2016-07-30 DIAGNOSIS — F0150 Vascular dementia without behavioral disturbance: Secondary | ICD-10-CM | POA: Diagnosis present

## 2016-07-30 DIAGNOSIS — E1151 Type 2 diabetes mellitus with diabetic peripheral angiopathy without gangrene: Secondary | ICD-10-CM | POA: Diagnosis present

## 2016-07-30 DIAGNOSIS — E785 Hyperlipidemia, unspecified: Secondary | ICD-10-CM | POA: Diagnosis present

## 2016-07-30 DIAGNOSIS — K56609 Unspecified intestinal obstruction, unspecified as to partial versus complete obstruction: Secondary | ICD-10-CM | POA: Diagnosis not present

## 2016-07-30 DIAGNOSIS — E1122 Type 2 diabetes mellitus with diabetic chronic kidney disease: Secondary | ICD-10-CM | POA: Diagnosis present

## 2016-07-30 DIAGNOSIS — Z7951 Long term (current) use of inhaled steroids: Secondary | ICD-10-CM | POA: Diagnosis not present

## 2016-07-30 DIAGNOSIS — I130 Hypertensive heart and chronic kidney disease with heart failure and stage 1 through stage 4 chronic kidney disease, or unspecified chronic kidney disease: Secondary | ICD-10-CM | POA: Diagnosis present

## 2016-07-30 DIAGNOSIS — Z794 Long term (current) use of insulin: Secondary | ICD-10-CM | POA: Diagnosis not present

## 2016-07-30 DIAGNOSIS — F1011 Alcohol abuse, in remission: Secondary | ICD-10-CM | POA: Diagnosis present

## 2016-07-30 DIAGNOSIS — R6520 Severe sepsis without septic shock: Secondary | ICD-10-CM | POA: Diagnosis present

## 2016-07-30 DIAGNOSIS — A419 Sepsis, unspecified organism: Secondary | ICD-10-CM | POA: Diagnosis present

## 2016-07-30 DIAGNOSIS — K766 Portal hypertension: Secondary | ICD-10-CM | POA: Diagnosis present

## 2016-07-30 DIAGNOSIS — Z9049 Acquired absence of other specified parts of digestive tract: Secondary | ICD-10-CM | POA: Diagnosis not present

## 2016-07-30 DIAGNOSIS — E1142 Type 2 diabetes mellitus with diabetic polyneuropathy: Secondary | ICD-10-CM | POA: Diagnosis present

## 2016-07-30 LAB — CBC WITH DIFFERENTIAL/PLATELET
BASOS ABS: 0 10*3/uL (ref 0.0–0.1)
BASOS PCT: 0 %
Eosinophils Absolute: 0 10*3/uL (ref 0.0–0.7)
Eosinophils Relative: 0 %
HCT: 41.3 % (ref 39.0–52.0)
HEMOGLOBIN: 14.1 g/dL (ref 13.0–17.0)
LYMPHS PCT: 33 %
Lymphs Abs: 17.4 10*3/uL — ABNORMAL HIGH (ref 0.7–4.0)
MCH: 32.5 pg (ref 26.0–34.0)
MCHC: 34.1 g/dL (ref 30.0–36.0)
MCV: 95.2 fL (ref 78.0–100.0)
MONOS PCT: 4 %
Monocytes Absolute: 2.1 10*3/uL — ABNORMAL HIGH (ref 0.1–1.0)
NEUTROS ABS: 33.1 10*3/uL — AB (ref 1.7–7.7)
Neutrophils Relative %: 63 %
Platelets: 331 10*3/uL (ref 150–400)
RBC: 4.34 MIL/uL (ref 4.22–5.81)
RDW: 15.8 % — ABNORMAL HIGH (ref 11.5–15.5)
WBC: 52.6 10*3/uL (ref 4.0–10.5)

## 2016-07-30 LAB — BLOOD CULTURE ID PANEL (REFLEXED)
ACINETOBACTER BAUMANNII: NOT DETECTED
CANDIDA ALBICANS: NOT DETECTED
CANDIDA GLABRATA: NOT DETECTED
CANDIDA KRUSEI: NOT DETECTED
Candida parapsilosis: NOT DETECTED
Candida tropicalis: NOT DETECTED
ENTEROBACTER CLOACAE COMPLEX: NOT DETECTED
ENTEROCOCCUS SPECIES: NOT DETECTED
ESCHERICHIA COLI: NOT DETECTED
Enterobacteriaceae species: NOT DETECTED
Haemophilus influenzae: NOT DETECTED
Klebsiella oxytoca: NOT DETECTED
Klebsiella pneumoniae: NOT DETECTED
LISTERIA MONOCYTOGENES: NOT DETECTED
Methicillin resistance: NOT DETECTED
Neisseria meningitidis: NOT DETECTED
PSEUDOMONAS AERUGINOSA: NOT DETECTED
Proteus species: NOT DETECTED
STREPTOCOCCUS AGALACTIAE: NOT DETECTED
STREPTOCOCCUS PNEUMONIAE: NOT DETECTED
Serratia marcescens: NOT DETECTED
Staphylococcus aureus (BCID): NOT DETECTED
Staphylococcus species: DETECTED — AB
Streptococcus pyogenes: NOT DETECTED
Streptococcus species: NOT DETECTED

## 2016-07-30 LAB — MRSA PCR SCREENING: MRSA by PCR: POSITIVE — AB

## 2016-07-30 LAB — PATHOLOGIST SMEAR REVIEW

## 2016-07-30 MED ORDER — ONDANSETRON 4 MG PO TBDP: 4.0000 mg | ORAL_TABLET | Freq: Four times a day (QID) | ORAL | Status: DC | PRN

## 2016-07-30 MED ORDER — SODIUM CHLORIDE 0.9 % IV SOLN
INTRAVENOUS | Status: DC
  Administered 2016-07-30: 06:00:00 via INTRAVENOUS

## 2016-07-30 MED ORDER — POLYVINYL ALCOHOL 1.4 % OP SOLN
1.0000 [drp] | Freq: Four times a day (QID) | OPHTHALMIC | Status: DC | PRN
  Filled 2016-07-30: qty 15

## 2016-07-30 MED ORDER — MORPHINE BOLUS VIA INFUSION
2.0000 mg | INTRAVENOUS | Status: DC | PRN
  Administered 2016-07-30 (×2): 2 mg via INTRAVENOUS
  Filled 2016-07-30: qty 4

## 2016-07-30 MED ORDER — ONDANSETRON HCL 4 MG/2ML IJ SOLN
4.0000 mg | Freq: Once | INTRAMUSCULAR | Status: AC
  Administered 2016-07-30: 4 mg via INTRAVENOUS
  Filled 2016-07-30: qty 2

## 2016-07-30 MED ORDER — LORAZEPAM 1 MG PO TABS: 1.0000 mg | ORAL_TABLET | ORAL | Status: DC | PRN

## 2016-07-30 MED ORDER — ACETAMINOPHEN 650 MG RE SUPP: 650.0000 mg | Freq: Four times a day (QID) | RECTAL | Status: DC | PRN

## 2016-07-30 MED ORDER — GLYCOPYRROLATE 0.2 MG/ML IJ SOLN
0.4000 mg | Freq: Three times a day (TID) | INTRAMUSCULAR | Status: DC
  Administered 2016-07-30 (×2): 0.4 mg via INTRAVENOUS
  Filled 2016-07-30 (×2): qty 2

## 2016-07-30 MED ORDER — ONDANSETRON HCL 4 MG/2ML IJ SOLN: 4.0000 mg | Freq: Four times a day (QID) | INTRAMUSCULAR | Status: DC | PRN

## 2016-07-30 MED ORDER — MORPHINE BOLUS VIA INFUSION: 1.0000 mg | INTRAVENOUS | Status: DC | PRN

## 2016-07-30 MED ORDER — LORAZEPAM 2 MG/ML IJ SOLN
1.0000 mg | INTRAMUSCULAR | Status: DC | PRN
  Administered 2016-07-30: 1 mg via INTRAVENOUS
  Filled 2016-07-30: qty 1

## 2016-07-30 MED ORDER — BIOTENE DRY MOUTH MT LIQD: 15.0000 mL | OROMUCOSAL | Status: DC | PRN

## 2016-07-30 MED ORDER — GLYCOPYRROLATE 0.2 MG/ML IJ SOLN: 0.2000 mg | INTRAMUSCULAR | Status: DC | PRN

## 2016-07-30 MED ORDER — GLYCOPYRROLATE 1 MG PO TABS
1.0000 mg | ORAL_TABLET | ORAL | Status: DC | PRN
  Filled 2016-07-30: qty 1

## 2016-07-30 MED ORDER — HALOPERIDOL LACTATE 2 MG/ML PO CONC
0.5000 mg | ORAL | Status: DC | PRN
  Filled 2016-07-30: qty 0.3

## 2016-07-30 MED ORDER — MORPHINE SULFATE (PF) 4 MG/ML IV SOLN
4.0000 mg | Freq: Once | INTRAVENOUS | Status: AC
  Administered 2016-07-30: 4 mg via INTRAVENOUS
  Filled 2016-07-30: qty 1

## 2016-07-30 MED ORDER — SODIUM CHLORIDE 0.9 % IV SOLN
1.0000 mg/h | INTRAVENOUS | Status: DC
  Administered 2016-07-30: 1 mg/h via INTRAVENOUS
  Administered 2016-07-30: 8 mg/h via INTRAVENOUS
  Filled 2016-07-30 (×2): qty 10

## 2016-07-30 MED ORDER — GLYCOPYRROLATE 0.2 MG/ML IJ SOLN
0.2000 mg | INTRAMUSCULAR | Status: DC | PRN
Start: 2016-07-30 — End: 2016-07-31

## 2016-07-30 MED ORDER — HALOPERIDOL 1 MG PO TABS: 0.5000 mg | ORAL_TABLET | ORAL | Status: DC | PRN

## 2016-07-30 MED ORDER — HALOPERIDOL LACTATE 5 MG/ML IJ SOLN: 0.5000 mg | INTRAMUSCULAR | Status: DC | PRN

## 2016-07-30 MED ORDER — MORPHINE BOLUS VIA INFUSION
1.0000 mg | INTRAVENOUS | Status: DC | PRN
  Filled 2016-07-30: qty 4

## 2016-07-30 MED ORDER — ACETAMINOPHEN 325 MG PO TABS: 650.0000 mg | ORAL_TABLET | Freq: Four times a day (QID) | ORAL | Status: DC | PRN

## 2016-07-30 MED ORDER — LORAZEPAM 2 MG/ML PO CONC: 1.0000 mg | ORAL | Status: DC | PRN

## 2016-07-30 NOTE — Consult Note (Signed)
Consultation Note Date: 07/30/2016   Patient Name: [name]  DOB: 1929/08/12  MRN: [id number]  Age / Sex: 83 y.o., male  PCP: Gildardo Cranker, DO Referring Physician: Hosie Poisson, MD  Reason for Consultation: Hospice Evaluation, Non pain symptom management, Pain control, Psychosocial/spiritual support and Terminal Care  HPI/Patient Profile: 83 y.o. male  with past medical history of portal HTN, PVD, DM, D-HF, wheel chair bound who was admitted on 07/03/2016 with multi-organ system failure.  Initial evaluation showed a lactic acid of 5.23, acute on chronic renal failure, a wbc count of 52, and an SBO secondary to an incarcerated right inguinal hernia.  He was admitted for comfort measures.  His family understands that he will not survive and decided to make him as comfortable as possible.  Clinical Assessment and Goals of Care:  I have reviewed medical records including EPIC notes, labs and imaging, received report from the care team, assessed the patient and then met at the bedside along with his wife and daughter  to discuss diagnosis prognosis, GOC, EOL wishes, disposition and options.  I introduced Palliative Medicine as specialized medical care for people living with serious illness. It focuses on providing relief from the symptoms and stress of a serious illness. The goal is to improve quality of life for both the patient and the family.  We discussed a brief life review of the patient.  He has lived in SNF for the past 12 years and had a very poor quality of life according to his wife.  She understands he could not survive surgery at this point.  Her top priority is for him to be comfortable. The family asked about prognosis.  We discussed it could be hours to days.  Hospice and Palliative Care services outpatient were explained and offered.  The family was receptive.  Questions and concerns were  addressed.  Gone from My Site booklet was left for review. The family was encouraged to call with questions or concerns.  PMT will continue to support holistically.   Primary Decision Maker:  NEXT OF KIN wife    SUMMARY OF RECOMMENDATIONS    Code Status/Advance Care Planning:  DNR    Symptom Management:   Morphine gtt and boluses for pain/dyspnea  Haldol for delusions  Additional Recommendations (Limitations, Scope, Preferences):  Full Comfort Care  Palliative Prophylaxis:   Delirium Protocol  Prognosis:   Hours - Days based on lack of oral intake for several days, multi-organ system failure, SBO  Discharge Planning: Anticipated Hospital Death vs Hospice House      Primary Diagnoses: Present on Admission: . Chronic kidney disease, stage 4, severely decreased GFR (HCC) . Strangulated inguinal hernia . SBO (small bowel obstruction) ([place]) . Severe sepsis ([place]) . Vascular dementia   I have reviewed the medical record, interviewed the patient and family, and examined the patient. The following aspects are pertinent.  Past Medical History:  Diagnosis Date  . 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 Anxiety   . At high risk for falls   .  Chronic diastolic congestive heart failure ([place]) 06/13/2012   Echo in 2011 with grade 1 diastolic dysfunction and normal systolic function with EF 60%.     . DDD (degenerative disc disease), lumbosacral   . Depression   . Depression with anxiety 11/27/2013  . Diabetic peripheral neuropathy ([place])   . Dyslipidemia   . GERD (gastroesophageal reflux disease)   . History of colon polyps   . History of diabetic ulcer of foot   . History of MRSA infection    Hx chronic diabetic ulcer's secondary MRSA  . Hypertension   . Insulin dependent type 2 diabetes mellitus ([place])   . Peripheral vascular disease ([place])   . Portal hypertension ([place]) 01/15/2010   Qualifier: Diagnosis of  By: Loanne Drilling MD, Jacelyn Pi   . Renal insufficiency   . Ulcer  of right ankle ([place])   . UNSPECIFIED PERIPHERAL VASCULAR DISEASE 01/15/2010   Qualifier: Diagnosis of  By: Loanne Drilling MD, Jacelyn Pi   . Wheelchair dependent    Social History   Social History  . Marital status: Married    Spouse name: N/A  . Number of children: N/A  . Years of education: N/A   Occupational History  .  Retired    Retired   Social History Main Topics  . Smoking status: Former Smoker    Types: Cigarettes  . Smokeless tobacco: Never Used  . Alcohol use No     Comment: hx alcohol abuse  in remission  . Drug use: No  . Sexual activity: No   Other Topics Concern  . None   Social History Narrative   [place] [facility] [employer] [place]    Family History  Problem Relation Age of Onset  . Diabetes Neg Hx        No DM in immediate family   Scheduled Meds: Continuous Infusions: . morphine 2 mg/hr (07/30/16 0714)   PRN Meds:.acetaminophen **OR** acetaminophen, antiseptic oral rinse, glycopyrrolate **OR** glycopyrrolate **OR** glycopyrrolate, haloperidol **OR** haloperidol **OR** haloperidol lactate, LORazepam **OR** LORazepam **OR** LORazepam, morphine, ondansetron **OR** ondansetron (ZOFRAN) IV, polyvinyl alcohol No Known Allergies Review of Systems patient is unconscious  Physical Exam  Well developed elderly male, obese, snoring.  Furrowed brow. Family at bedside. CV rrr Resp coarse   Vital Signs: BP (!) 165/88 (BP Location: Right Arm)   Pulse 88   Temp 98.4 F (36.9 C) (Axillary)   Resp (!) 22   Wt 85.2 kg (187 lb 14.4 oz)   SpO2 100%   BMI 26.96 kg/m  Pain Assessment: Faces   Pain Score: Asleep   SpO2: SpO2: 100 % O2 Device:SpO2: 100 % O2 Flow Rate: .   IO: Intake/output summary:  Intake/Output Summary (Last 24 hours) at 07/30/16 1307 Last data filed at 07/30/16 1046  Gross per 24 hour  Intake          3136.17 ml  Output                0 ml  Net          3136.17 ml    LBM:   Baseline Weight: Weight: 85.2 kg (187 lb 14.4 oz) Most recent weight:  Weight: 85.2 kg (187 lb 14.4 oz)     Palliative Assessment/Data:   Flowsheet Rows     Most Recent Value  Intake Tab  Referral Department  Hospitalist  Unit at Time of Referral  Med/Surg Unit  Palliative Care Primary Diagnosis  Sepsis/Infectious Disease  Date Notified  07/30/16  Palliative Care Type  Not seen  Reason for referral  Pain, Non-pain Symptom, End of Life Care Assistance  Date of Admission  07/30/16  Date first seen by Palliative Care  07/30/16  # of days Palliative referral response time  0 Day(s)  # of days IP prior to Palliative referral  0  Clinical Assessment  Palliative Performance Scale Score  10%  Pain Max last 24 hours  5  Psychosocial & Spiritual Assessment  Palliative Care Outcomes  Patient/Family meeting held?  Yes  Who was at the meeting?  wife, dtr, gdtr, patient  Palliative Care Outcomes  Improved pain interventions, Counseled regarding hospice      Time In: 9:00 Time Out: 9:50 Time Total: 50 min. Greater than 50%  of this time was spent counseling and coordinating care related to the above assessment and plan.  Signed by: Imogene Burn, PA-C Palliative Medicine Pager: [phone]  Please contact Palliative Medicine Team phone at [phone] for questions and concerns.  For individual provider: See Shea Evans

## 2016-07-30 NOTE — Progress Notes (Signed)
Nutrition Brief Note  Chart reviewed. Pt now transitioning to comfort care.  No further nutrition interventions warranted at this time.  Please re-consult as needed.   Ysabella Babiarz A. Balraj Brayfield, RD, LDN, CDE Pager: [phone] After hours Pager: [phone]  

## 2016-07-30 NOTE — ED Notes (Signed)
Patient returned from CT

## 2016-07-30 NOTE — H&P (Signed)
History and Physical    [name] JXB:[id number] DOB: 19-Nov-1929 DOA: August 16, 2016  PCP: Kirt Boys, DO  Patient coming from: SNF  I have personally briefly reviewed patient's old medical records in [facility]  Chief Complaint: Agitation, abd pain  HPI: [name] is a 83 y.o. male with medical history significant of severe vascular dementia, IDDM, PVD, HTN, EtOH abuse in remission.  At baseline the patient lives in SNF where he has resided for past 12 years per his wife, at baseline he has "no quality of life" per his wife.  Patient became more altered towards the end of the week apparently, and began to complain of abdominal pain.  Wife notes he was agitated and nothing else he said was rational except statements about abdominal pain and dying.  She went on to describe symptoms c/w delirium including seeing people in the room that wernt present.   ED Course: WBC 54k, lactate 5.x, Patient septic with fever 100.8, tachy to 110s, RR initially 38 improves to 23 after IVF.  Creat is 3.67, up from baseline of about 1.7-2.  Patient is agitated and delirious initially.  CT abd pelvis reveals high grade SBO with incarcerated inguinal hernia the bladder is also partially herniated as well.  Patient given zosyn and vanc.  Surgery is consulted but determine that he would not be a candidate for surgical intervention at this point.  Wife understands and wants the patient kept comfortable.   Review of Systems: As per HPI otherwise 10 point review of systems negative.   Past Medical History:  Diagnosis Date  . 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 Anxiety   . At high risk for falls   . Chronic diastolic congestive heart failure (HCC) 06/13/2012   Echo in 2011 with grade 1 diastolic dysfunction and normal systolic function with EF 60%.     . DDD (degenerative disc disease), lumbosacral   . Depression   . Depression with anxiety 11/27/2013  . Diabetic peripheral neuropathy (HCC)     . Dyslipidemia   . GERD (gastroesophageal reflux disease)   . History of colon polyps   . History of diabetic ulcer of foot   . History of MRSA infection    Hx chronic diabetic ulcer's secondary MRSA  . Hypertension   . Insulin dependent type 2 diabetes mellitus (HCC)   . Peripheral vascular disease (HCC)   . Portal hypertension (HCC) 01/15/2010   Qualifier: Diagnosis of  By: Everardo All MD, Cleophas Dunker   . Renal insufficiency   . Ulcer of right ankle (HCC)   . UNSPECIFIED PERIPHERAL VASCULAR DISEASE 01/15/2010   Qualifier: Diagnosis of  By: Everardo All MD, Cleophas Dunker   . Wheelchair dependent     Past Surgical History:  Procedure Laterality Date  . COLONOSCOPY WITH ESOPHAGOGASTRODUODENOSCOPY (EGD)  09-21-2002  . INCISION AND DRAINAGE OF WOUND Right 01/10/2014   Procedure: IRRIGATION AND DEBRIDEMENT RIGHT ANKLE WOUND WITH PLACEMENT OF ACELL;  Surgeon: Wayland Denis, DO;  Location: [facility];  Service: Plastics;  Laterality: Right;  . INGUINAL HERNIA REPAIR Left 11-30-2001  . LAPAROSCOPIC CHOLECYSTECTOMY    . TOE AMPUTATION    . TRANSTHORACIC ECHOCARDIOGRAM  12-20-2009   severe LVH/  ef 60%/  grade I diastolic dysfunction/  AV sclerosis without stenosis/  mild LAE and RAE/       reports that he has quit smoking. His smoking use included Cigarettes. He has never used smokeless tobacco. He reports that he does not  drink alcohol or use drugs.  No Known Allergies  Family History  Problem Relation Age of Onset  . Diabetes Neg Hx        No DM in immediate family     Prior to Admission medications   Medication Sig Start Date End Date Taking? Authorizing Provider  acetaminophen (TYLENOL) 325 MG tablet Take 2 tablets (650 mg total) by mouth every 6 (six) hours as needed for mild pain. 10/14/15   Filbert Schilder, MD  Amino Acids-Protein Hydrolys (FEEDING SUPPLEMENT, PRO-STAT SUGAR FREE 64,) LIQD Take 30 mLs by mouth 2 (two) times daily.    [provider]  amLODipine  (NORVASC) 10 MG tablet Take 10 mg by mouth daily.    [provider]  ascorbic acid (VITAMIN C) 500 MG tablet Take 500 mg by mouth 2 (two) times daily.    [provider]  aspirin 81 MG tablet Take 81 mg by mouth every morning.     [provider]  cloNIDine (CATAPRES) 0.1 MG tablet Give 0.2 mg by mouth every morning (0800) and 0.1 mg by mouth every evening (1800)    [provider]  coal tar (NEUTROGENA T-GEL) 0.5 % shampoo Apply topically every Wednesday and Saturday weekly as needed for Dandruff as resident allows    [provider]  diclofenac sodium (VOLTAREN) 1 % GEL Apply 2 g topically 3 (three) times daily. To right wrist    [provider]  docusate sodium (COLACE) 100 MG capsule Take 1 capsule (100 mg total) by mouth 2 (two) times daily. 10/14/15   Filbert Schilder, MD  dorzolamide-timolol (COSOPT) 22.3-6.8 MG/ML ophthalmic solution Place 1 drop into both eyes 2 (two) times daily.    [provider]  famotidine (PEPCID) 20 MG tablet Take 20 mg by mouth 2 (two) times daily.    [provider]  fenofibrate (TRICOR) 145 MG tablet Take 145 mg by mouth every evening.     [provider]  fluticasone (FLONASE) 50 MCG/ACT nasal spray Place 2 sprays into both nostrils daily.    [provider]  furosemide (LASIX) 40 MG tablet Take 40 mg by mouth 2 (two) times daily.    [provider]  gabapentin (NEURONTIN) 100 MG capsule Take 100 mg by mouth 2 (two) times daily.    [provider]  insulin glargine (LANTUS) 100 UNIT/ML injection Inject 15 Units into the skin at bedtime.     [provider]  isosorbide mononitrate (IMDUR) 60 MG 24 hr tablet Take 60 mg by mouth every morning.     [provider]  latanoprost (XALATAN) 0.005 % ophthalmic solution Place 1 drop into both eyes at bedtime.      [provider]  lidocaine (LIDODERM) 5 % Place 1 patch onto the skin  every 12 (twelve) hours. Apply 1 patch topically to right neck and left hip daily in the PM and remove in the AM . Remove & Discard patch within 12 hours    [provider]  lisinopril (PRINIVIL,ZESTRIL) 10 MG tablet Take 10 mg by mouth daily.     [provider]  LORazepam (ATIVAN) 0.5 MG tablet Take 0.5 mg by mouth daily as needed for anxiety. Give 1/2 tab (0.25 mg) every morning as needed.    [provider]  Memantine HCl ER (NAMENDA XR) 14 MG CP24 Take 1 tablet by mouth every morning.    [provider]  metoprolol (LOPRESSOR) 50 MG tablet Take 50  mg by mouth 2 (two) times daily.    [provider]  mirtazapine (REMERON) 7.5 MG tablet Take 7.5 mg by mouth at bedtime.    [provider]  Nutritional Supplements (NUTRITIONAL SUPPLEMENT PO) Take by mouth. HSG Mech Soft Texture, for diet    [provider]  Nutritional Supplements (NUTRITIONAL SUPPLEMENT PO) Take by mouth. House Shake two times daily at lunch and dinner meals    [provider]  nystatin (MYCOSTATIN/NYSTOP) powder Apply topically as needed.     [provider]  ondansetron (ZOFRAN) 4 MG tablet Take 4 mg by mouth every 8 (eight) hours as needed for nausea or vomiting.    [provider]  oxyCODONE (OXYCONTIN) 20 mg 12 hr tablet Take 20 mg by mouth every 12 (twelve) hours.    [provider]  polyethylene glycol powder (GLYCOLAX/MIRALAX) powder Take 17 g by mouth daily.     [provider]  potassium chloride (KLOR-CON) 20 MEQ packet Take 20 mEq by mouth 2 (two) times daily.    [provider]  SACCHAROMYCES BOULARDII PO Take 1 capsule by mouth 2 (two) times daily.    [provider]  senna (SENOKOT) 8.6 MG tablet Take 2 tablets by mouth 2 (two) times daily.     [provider]  zinc sulfate 220 (50 Zn) MG capsule Take 220 mg by mouth daily.    [provider]    Physical Exam: Vitals:    07/30/16 0200 07/30/16 0230 07/30/16 0300 07/30/16 0330  BP: (!) 168/73 (!) 168/82 (!) 152/104 (!) 161/80  Pulse: (!) 107 (!) 107 68   Resp: 19 16 (!) 21 (!) 23  Temp:      TempSrc:      SpO2: 97% 98% (!) 66%     Constitutional: NAD, calm, comfortable Eyes: PERRL, lids and conjunctivae normal ENMT: Mucous membranes are moist. Posterior pharynx clear of any exudate or lesions.Normal dentition.  Neck: normal, supple, no masses, no thyromegaly Respiratory: clear to auscultation bilaterally, no wheezing, no crackles. Normal respiratory effort. No accessory muscle use.  Cardiovascular: Regular rate and rhythm, no murmurs / rubs / gallops. No extremity edema. 2+ pedal pulses. No carotid bruits.  Abdomen: no tenderness, no masses palpated. No hepatosplenomegaly. Bowel sounds positive.  Musculoskeletal: no clubbing / cyanosis. No joint deformity upper and lower extremities. Good ROM, no contractures. Normal muscle tone.  Skin: no rashes, lesions, ulcers. No induration Neurologic: CN 2-12 grossly intact. Sensation intact, DTR normal. Strength 5/5 in all 4.  Psychiatric: Normal judgment and insight. Alert and oriented x 3. Normal mood.    Labs on Admission: I have personally reviewed following labs and imaging studies  CBC:  Recent Labs Lab 07/23/16 2135 2016/08/01 2253  WBC 11.5* 52.6*  NEUTROABS 6.5 33.1*  HGB 10.9* 14.1  HCT 33.3* 41.3  MCV 94.3 95.2  PLT 152 331   Basic Metabolic Panel:  Recent Labs Lab 07/23/16 2135 31-Deerman-2018 2253  NA 139 142  K 4.1 4.7  CL 109 100*  CO2 24 21*  GLUCOSE 181* 306*  BUN 24* 44*  CREATININE 1.70* 3.67*  CALCIUM 10.2 10.4*   GFR: CrCl cannot be calculated (Unknown ideal weight.). Liver Function Tests:  Recent Labs Lab 08-01-16 2253  AST 34  ALT 15*  ALKPHOS 80  BILITOT 1.4*  PROT 7.6  ALBUMIN 4.0    Recent Labs Lab Ging 31, 2018 2253  LIPASE 18   No results for input(s): AMMONIA in the last 168 hours.  Coagulation Profile: No  results for input(s): INR, PROTIME in the last 168 hours. Cardiac Enzymes: No results for input(s): CKTOTAL, CKMB, CKMBINDEX, TROPONINI in the last 168 hours. BNP (last 3 results) No results for input(s): PROBNP in the last 8760 hours. HbA1C: No results for input(s): HGBA1C in the last 72 hours. CBG:  Recent Labs Lab 07/23/16 2128  GLUCAP 160*   Lipid Profile: No results for input(s): CHOL, HDL, LDLCALC, TRIG, CHOLHDL, LDLDIRECT in the last 72 hours. Thyroid Function Tests: No results for input(s): TSH, T4TOTAL, FREET4, T3FREE, THYROIDAB in the last 72 hours. Anemia Panel: No results for input(s): VITAMINB12, FOLATE, FERRITIN, TIBC, IRON, RETICCTPCT in the last 72 hours. Urine analysis:    Component Value Date/Time   COLORURINE YELLOW 07/23/2016 2243   APPEARANCEUR CLEAR 07/23/2016 2243   LABSPEC 1.016 07/23/2016 2243   PHURINE 5.0 07/23/2016 2243   GLUCOSEU 150 (A) 07/23/2016 2243   HGBUR NEGATIVE 07/23/2016 2243   BILIRUBINUR NEGATIVE 07/23/2016 2243   KETONESUR NEGATIVE 07/23/2016 2243   PROTEINUR 100 (A) 07/23/2016 2243   UROBILINOGEN 0.2 03/14/2009 2303   NITRITE NEGATIVE 07/23/2016 2243   LEUKOCYTESUR NEGATIVE 07/23/2016 2243    Radiological Exams on Admission: Ct Abdomen Pelvis Wo Contrast  Result Date: 07/30/2016 CLINICAL DATA:  83 year old male with abdominal pain. EXAM: CT ABDOMEN AND PELVIS WITHOUT CONTRAST TECHNIQUE: Multidetector CT imaging of the abdomen and pelvis was performed following the standard protocol without IV contrast. COMPARISON:  Abdominal radiograph dated 03/03/2010 and CT dated 02/21/2010 FINDINGS: Evaluation of this exam is limited in the absence of intravenous contrast. Lower chest: There are bibasilar dependent atelectatic changes of the lungs. The visualized lung bases are otherwise clear. Choose top-normal cardiac size. Coronary vascular calcification primarily involving the LAD. There is calcification of the mitral annulus. There is no  intra-abdominal free air or free fluid. Hepatobiliary: There is slight irregularity of the hepatic contour. Correlation with clinical exam and liver function tests recommended to evaluate for cirrhosis. No intrahepatic biliary ductal dilatation. Cholecystectomy. Pancreas: Unremarkable. No pancreatic ductal dilatation or surrounding inflammatory changes. Spleen: Mildly enlarged measuring up to 15 cm in craniocaudal length. Adrenals/Urinary Tract: There is a 2.2 cm hypodense right adrenal nodule which is not well characterized on today's CT balloon was present on the CT of 2011 and most likely represents an adenoma. The left adrenal gland appears unremarkable. There is moderate bilateral renal parenchymal atrophy. There is no hydronephrosis or nephrolithiasis on either side. Multiple bilateral renal hypodense lesions measure up to 2.8 cm in the upper pole of the right kidney. These lesions are not characterized on this CT but Byer represent cysts. Ultrasound or MRI Conigliaro provide better characterisation. The visualized ureters appear unremarkable. The bladder is only partially distended. There is partial herniation of the urinary bladder into the right inguinal hernia. Stomach/Bowel: The stomach is distended and contains air and fluid. There is no evidence of gastric outlet obstruction. Multiple dilated and fluid-filled loops of small bowel noted measuring up to 5 cm. There is herniation of a segment of distal small bowel into the right inguinal canal. There is inflammatory changes and mild dilatation of the herniated loop of bowel. There is dilatation of the loops of bowel proximal to the hernia. The distal small bowel distal to the right inguinal hernia are decompressed. There are small scattered sigmoid diverticula without active inflammatory changes. The appendix is normal. Vascular/Lymphatic: There is advanced aortoiliac atherosclerotic disease. The aorta is tortuous and ectatic measuring up to 2.3 cm distally. No  portal venous gas identified. There is no adenopathy. A rim calcified 2.2 x 1.7 cm reniform structure in the left hemipelvis (series 3, image 72) likely represents a calcified lymph node. Reproductive: The prostate and seminal vesicles are grossly unremarkable. Other: None Musculoskeletal: There is advanced osteopenia and degenerative changes of the thoracolumbar spine. Grade 1 L1-L2 retrolisthesis with disc desiccation and vacuum phenomena at this level. Multilevel old-appearing compression deformity. L2 laminectomy as well as multiple posterior metallic interspinous device. There is age indeterminate, probable old displaced right femoral neck fracture with nonunion. Clinical correlation is recommended. No definite acute fracture. IMPRESSION: 1. Small-bowel obstruction secondary to herniation of an incarcerated loop of distal small bowel into the right inguinal canal. Clinical correlation and surgical consult is advised. 2. Partial herniation of the bladder into the right inguinal hernia. 3. Sigmoid diverticulosis without active inflammatory changes. 4. Atherosclerotic disease of the aorta. 5. Osteopenia with extensive degenerative changes of the spine. Age indeterminate, old-appearing displaced right femoral neck fracture with nonunion. Clinical correlation is recommended. Electronically Signed   By: Elgie Collard M.D.   On: 07/30/2016 01:42   Dg Chest Port 1 View  Result Date: 2016/08/24 CLINICAL DATA:  83 year old male with shortness of breath. EXAM: PORTABLE CHEST 1 VIEW COMPARISON:  Chest radiograph dated 07/23/2016 FINDINGS: There is shallow inspiration. The lungs are clear. There is no pleural effusion or pneumothorax. Stable top-normal cardiac size. There is atherosclerotic calcification of the aorta. No acute osseous pathology. IMPRESSION: No active disease. Electronically Signed   By: Elgie Collard M.D.   On: 2016/08/24 23:21    EKG: Independently reviewed.  Assessment/Plan Principal  Problem:   Admission for end of life care Active Problems:   Vascular dementia   Chronic kidney disease, stage 4, severely decreased GFR (HCC)   Strangulated inguinal hernia   SBO (small bowel obstruction) (HCC)   Severe sepsis (HCC)    1. Admission for end of life care - 83 yo M with severe vascular dementia and "no quality of life" (per wife) at baseline, now with SBO secondary to incarcerated inguinal hernia, likely with strangulation and ischemic bowel too given the sepsis picture.  Not a candidate for surgery per EDP discussion with surgeon. 1. Patient being admitted for end of life care and comfort measures 2. Pain control with morphine gtt and boluses as needed 3. Ativan / haldol as needed for agitation 4. No further lab draws 5. No further ABx as these are quite futile at this stage given the above terminal diagnosis. 6. Will leave him on some IVF for comfort for now 7. Spoke with Wife: she fully understands and accepts that this is a terminal event, patient likely Sandate die in the next couple of days and Buonomo not even survive until morning.  She reiterates desire for comfort of patient, and his lack of any QoL at baseline. 1. His poor QoL is indeed reflected / supported by my review of the numerous SNF notes by Synthia Innocent, NP.  Notes available in Epic. 8. Call Pal care in AM  DVT prophylaxis: None due to Hospice status Code Status: DNR/DNI - comfort measures only Family Communication: Spoke with Wife on phone, she understands that this is a terminal event, patient expected to die from this, she understands and agrees with comfort measures as above. Disposition Plan: Anticipate hospital death vs hospice at SNF vs inpatient hospice Consults called: None, call pal care in AM Admission status: Admit to inpatient - inpatient admission for IV morphine including  continuous morphine drip and end of life care in this dying patient.   Hillary Bow DO [facility] Pager  [phone]  If 7AM-7PM, please contact day team taking care of patient [email or web address] Password [email or web address]  07/30/2016, 3:59 AM

## 2016-07-30 NOTE — Progress Notes (Signed)
[name] is a 83 y.o. male with medical history significant of severe vascular dementia, IDDM, PVD, HTN, EtOH abuse in remission.  At baseline the patient lives in SNF where he has resided for past 12 years per his wife, at baseline he has "no quality of life" per his wife. Admitted for altered mental status.   Plan:  1. End of life care. Palliative consulted and recommendations given  2. On morphine drip.   [name] ModyVijaya Manpreet Strey MD [phone]

## 2016-07-30 NOTE — ED Notes (Signed)
Patient transported to CT 

## 2016-07-30 NOTE — Progress Notes (Signed)
Responded to consult for end of life and visited w/ wife of 61 yrs and stepson in rm. Latter said pt had been his dad since he was 89 yrs old, after his biological dad died when he was 4. He lives in [place], and his sister lives here -- so is able to help her mom w/ much, including her doctor appts.   Pt's wife said they had both been thru "a lot" and have [facility] -- she'd  had 6 knee replacements. They were full of compliments for the quality of care they've always received from staff here. Wife said pt's doctor called her several times b/t pt's coming to ED late last night and 0430 this morning.  Pt has been in facility for almost 13 yrs, and per stepson, has not had much quality of life for about 20 yrs. Wife said pt never knew what he wanted in life. She said now all she wants is peace -- for him and for herself. Her prayer is that the Lord take him easy.  Provided emotional/spiritual support, ministry of presence/touch, and prayer.    07/30/16 1800  Clinical Encounter Type  Visited With Patient and family together  Visit Type Initial;Psychological support;Spiritual support;Social support;Other (Comment) (transitioned to comfort care)  Referral From Nurse  Spiritual Encounters  Spiritual Needs Prayer;Emotional;Grief support  Stress Factors  Patient Stress Factors Health changes;Loss of control  Family Stress Factors Family relationships;Health changes;Loss;Loss of control   Ephraim Hamburgerynthia A Kord Monette, [address]

## 2016-07-30 NOTE — Progress Notes (Signed)
  PHARMACY - PHYSICIAN COMMUNICATION CRITICAL VALUE ALERT - BLOOD CULTURE IDENTIFICATION (BCID)  Results for orders placed or performed during the hospital encounter of Mar 02, 2017  Blood Culture ID Panel (Reflexed) (Collected: 07/16/2016 11:25 PM)  Result Value Ref Range   Enterococcus species NOT DETECTED NOT DETECTED   Listeria monocytogenes NOT DETECTED NOT DETECTED   Staphylococcus species DETECTED (A) NOT DETECTED   Staphylococcus aureus NOT DETECTED NOT DETECTED   Methicillin resistance NOT DETECTED NOT DETECTED   Streptococcus species NOT DETECTED NOT DETECTED   Streptococcus agalactiae NOT DETECTED NOT DETECTED   Streptococcus pneumoniae NOT DETECTED NOT DETECTED   Streptococcus pyogenes NOT DETECTED NOT DETECTED   Acinetobacter baumannii NOT DETECTED NOT DETECTED   Enterobacteriaceae species NOT DETECTED NOT DETECTED   Enterobacter cloacae complex NOT DETECTED NOT DETECTED   Escherichia coli NOT DETECTED NOT DETECTED   Klebsiella oxytoca NOT DETECTED NOT DETECTED   Klebsiella pneumoniae NOT DETECTED NOT DETECTED   Proteus species NOT DETECTED NOT DETECTED   Serratia marcescens NOT DETECTED NOT DETECTED   Haemophilus influenzae NOT DETECTED NOT DETECTED   Neisseria meningitidis NOT DETECTED NOT DETECTED   Pseudomonas aeruginosa NOT DETECTED NOT DETECTED   Candida albicans NOT DETECTED NOT DETECTED   Candida glabrata NOT DETECTED NOT DETECTED   Candida krusei NOT DETECTED NOT DETECTED   Candida parapsilosis NOT DETECTED NOT DETECTED   Candida tropicalis NOT DETECTED NOT DETECTED   Patient is full comfort care and this seems like contaminant with staph species. No change needed to therapy. Continue morphine gtt.  Enzo BiNathan Channell Quattrone, PharmD, BCPS Clinical Pharmacist Pager [phone] 07/30/2016 9:52 PM

## 2016-08-01 LAB — CULTURE, BLOOD (ROUTINE X 2): Special Requests: ADEQUATE

## 2016-08-02 NOTE — Progress Notes (Signed)
Late last night stopped back by to see pt, but no family was present. This morning saw on chart pt had passed and family notified, went to rm, had just missed family, offered silent prayer for them all.    2016-12-16 0700  Clinical Encounter Type  Visited With Patient not available  Visit Type Follow-up  Referral From Chaplain   Ephraim Hamburgerynthia A Anzleigh Slaven, Chaplain

## 2016-08-02 NOTE — Progress Notes (Signed)
Wasted 125 cc of morphine in sink with nurse Desma PaganiniImelda Tulian RN.

## 2016-08-02 NOTE — Progress Notes (Signed)
Called patient wife Verlon AuLeslie Siefker at number [phone] no answer message left on answering machine to call hospital.

## 2016-08-02 NOTE — Progress Notes (Signed)
Patient wife Verlon AuLeslie Franey returned phone call .Informed her that her husband passed away at [id number].Family to come see patient.

## 2016-08-02 NOTE — Progress Notes (Signed)
Patient's body transferred to the morgue. 

## 2016-08-02 NOTE — Progress Notes (Signed)
Patient pronounced dead by Dr.A. Toniann FailKakrakandy at [phone].

## 2016-08-02 NOTE — Progress Notes (Signed)
Patient found without pulse,respirations,and blood pressure,and pupils fixed and dilated.Patient is DNR.Findings confirmed by nurse Desma PaganiniImelda Tulian RN.Call placed to Dr.Kakrakandy.

## 2016-08-02 DEATH — deceased

## 2016-08-04 LAB — CULTURE, BLOOD (ROUTINE X 2)
Culture: NO GROWTH
SPECIAL REQUESTS: ADEQUATE

## 2016-09-01 NOTE — Discharge Summary (Signed)
Death Summary  [name]:[id number] DOB: May 05, 1929 DOA: [name] 30, 2018  PCP: Kirt Boys, DO  Admit date: 07/19/2016 Date of Death: 2016/08/02 Time of Death: 4 :15 am Notification: Kirt Boys, DO notified of death of 08/07/2016   History of present illness:   [name] a 83 y.o.malewith medical history significant of severe vascular dementia, IDDM, PVD, HTN, EtOH abuse in remission. At baseline the patient lives in SNF where he has resided for past 12 years per his wife, at baseline he has "no quality of life" per his wife. Admitted for altered mental status.   Plan:  1. End of life care. Palliative consulted and recommendations given  2.  He was started On morphine drip and passed away on 2022/08/03 ar 4 15 am   The results of significant diagnostics from this hospitalization (including imaging, microbiology, ancillary and laboratory) are listed below for reference.    Significant Diagnostic Studies: Ct Abdomen Pelvis Wo Contrast  Result Date: 07/30/2016 CLINICAL DATA:  83 year old male with abdominal pain. EXAM: CT ABDOMEN AND PELVIS WITHOUT CONTRAST TECHNIQUE: Multidetector CT imaging of the abdomen and pelvis was performed following the standard protocol without IV contrast. COMPARISON:  Abdominal radiograph dated 03/03/2010 and CT dated 02/21/2010 FINDINGS: Evaluation of this exam is limited in the absence of intravenous contrast. Lower chest: There are bibasilar dependent atelectatic changes of the lungs. The visualized lung bases are otherwise clear. Choose top-normal cardiac size. Coronary vascular calcification primarily involving the LAD. There is calcification of the mitral annulus. There is no intra-abdominal free air or free fluid. Hepatobiliary: There is slight irregularity of the hepatic contour. Correlation with clinical exam and liver function tests recommended to evaluate for cirrhosis. No intrahepatic biliary ductal dilatation. Cholecystectomy. Pancreas:  Unremarkable. No pancreatic ductal dilatation or surrounding inflammatory changes. Spleen: Mildly enlarged measuring up to 15 cm in craniocaudal length. Adrenals/Urinary Tract: There is a 2.2 cm hypodense right adrenal nodule which is not well characterized on today's CT balloon was present on the CT of 2011 and most likely represents an adenoma. The left adrenal gland appears unremarkable. There is moderate bilateral renal parenchymal atrophy. There is no hydronephrosis or nephrolithiasis on either side. Multiple bilateral renal hypodense lesions measure up to 2.8 cm in the upper pole of the right kidney. These lesions are not characterized on this CT but [place] represent cysts. Ultrasound or MRI [place] provide better characterisation. The visualized ureters appear unremarkable. The bladder is only partially distended. There is partial herniation of the urinary bladder into the right inguinal hernia. Stomach/Bowel: The stomach is distended and contains air and fluid. There is no evidence of gastric outlet obstruction. Multiple dilated and fluid-filled loops of small bowel noted measuring up to 5 cm. There is herniation of a segment of distal small bowel into the right inguinal canal. There is inflammatory changes and mild dilatation of the herniated loop of bowel. There is dilatation of the loops of bowel proximal to the hernia. The distal small bowel distal to the right inguinal hernia are decompressed. There are small scattered sigmoid diverticula without active inflammatory changes. The appendix is normal. Vascular/Lymphatic: There is advanced aortoiliac atherosclerotic disease. The aorta is tortuous and ectatic measuring up to 2.3 cm distally. No portal venous gas identified. There is no adenopathy. A rim calcified 2.2 x 1.7 cm reniform structure in the left hemipelvis (series 3, image 72) likely represents a calcified lymph node. Reproductive: The prostate and seminal vesicles are grossly unremarkable. Other: None  Musculoskeletal: There is  advanced osteopenia and degenerative changes of the thoracolumbar spine. Grade 1 L1-L2 retrolisthesis with disc desiccation and vacuum phenomena at this level. Multilevel old-appearing compression deformity. L2 laminectomy as well as multiple posterior metallic interspinous device. There is age indeterminate, probable old displaced right femoral neck fracture with nonunion. Clinical correlation is recommended. No definite acute fracture. IMPRESSION: 1. Small-bowel obstruction secondary to herniation of an incarcerated loop of distal small bowel into the right inguinal canal. Clinical correlation and surgical consult is advised. 2. Partial herniation of the bladder into the right inguinal hernia. 3. Sigmoid diverticulosis without active inflammatory changes. 4. Atherosclerotic disease of the aorta. 5. Osteopenia with extensive degenerative changes of the spine. Age indeterminate, old-appearing displaced right femoral neck fracture with nonunion. Clinical correlation is recommended. Electronically Signed   By: Elgie CollardArash  Radparvar M.D.   On: 07/30/2016 01:42   Dg Chest 2 View  Result Date: 07/23/2016 CLINICAL DATA:  83 y/o  M; status post fall. EXAM: CHEST  2 VIEW COMPARISON:  10/12/2015 chest radiograph FINDINGS: Normal cardiac silhouette. Aortic atherosclerosis with calcification. Clear lungs. No pleural effusion or pneumothorax. Multilevel degenerative changes of the spine. No acute osseous abnormality is evident. IMPRESSION: No acute pulmonary process.  Aortic atherosclerosis. Electronically Signed   By: Mitzi HansenLance  Furusawa-Stratton M.D.   On: 07/23/2016 22:08   Ct Head Wo Contrast  Result Date: 07/24/2016 CLINICAL DATA:  Acute onset of agitation and altered mental status. Initial encounter. EXAM: CT HEAD WITHOUT CONTRAST CT CERVICAL SPINE WITHOUT CONTRAST TECHNIQUE: Multidetector CT imaging of the head and cervical spine was performed following the standard protocol without intravenous  contrast. Multiplanar CT image reconstructions of the cervical spine were also generated. COMPARISON:  CT of the head and cervical spine performed 10/12/2015 FINDINGS: CT HEAD FINDINGS Brain: No evidence of acute infarction, hemorrhage, hydrocephalus, extra-axial collection or mass lesion/mass effect. Prominence of the ventricles and sulci reflects moderate cortical volume loss. Diffuse periventricular and subcortical white matter change likely reflects small vessel ischemic microangiopathy. Cerebellar atrophy is noted. The brainstem and fourth ventricle are within normal limits. The basal ganglia are unremarkable in appearance. The cerebral hemispheres demonstrate grossly normal gray-white differentiation. No mass effect or midline shift is seen. Vascular: No hyperdense vessel or unexpected calcification. Skull: There is no evidence of fracture; visualized osseous structures are unremarkable in appearance. Sinuses/Orbits: The orbits are within normal limits. The paranasal sinuses and mastoid air cells are well-aerated. Other: No significant soft tissue abnormalities are seen. CT CERVICAL SPINE FINDINGS Alignment: Normal. Skull base and vertebrae: No acute fracture. No primary bone lesion or focal pathologic process. Osseous fusion is noted at C5-C6. The patient is status post decompression at the mid to lower cervical spine. Soft tissues and spinal canal: No prevertebral fluid or swelling. No visible canal hematoma. Disc levels: Multilevel disc space narrowing is noted along the cervical spine, with multilevel vacuum phenomenon. Scattered anterior and posterior disc osteophyte complexes are noted along the cervical spine, with underlying facet disease. Mild degenerative change is noted about the dens. Upper chest: The thyroid gland is unremarkable in appearance. The visualized lung bases are clear. Calcification is noted at the right carotid bifurcation. Other: No additional soft tissue abnormalities are seen.  IMPRESSION: 1. No evidence of traumatic intracranial injury or fracture. 2. No evidence of fracture or subluxation along the cervical spine. 3. Moderate cortical volume loss and diffuse small vessel ischemic microangiopathy. 4. Degenerative change along the cervical spine, with chronic osseous fusion at C5-C6, and decompression at the mid to  lower cervical spine. 5. Calcification at the right carotid bifurcation. Carotid ultrasound would be helpful for further evaluation, when and as deemed clinically appropriate. Electronically Signed   By: Roanna Raider M.D.   On: 07/24/2016 00:15   Ct Cervical Spine Wo Contrast  Result Date: 07/24/2016 CLINICAL DATA:  Acute onset of agitation and altered mental status. Initial encounter. EXAM: CT HEAD WITHOUT CONTRAST CT CERVICAL SPINE WITHOUT CONTRAST TECHNIQUE: Multidetector CT imaging of the head and cervical spine was performed following the standard protocol without intravenous contrast. Multiplanar CT image reconstructions of the cervical spine were also generated. COMPARISON:  CT of the head and cervical spine performed 10/12/2015 FINDINGS: CT HEAD FINDINGS Brain: No evidence of acute infarction, hemorrhage, hydrocephalus, extra-axial collection or mass lesion/mass effect. Prominence of the ventricles and sulci reflects moderate cortical volume loss. Diffuse periventricular and subcortical white matter change likely reflects small vessel ischemic microangiopathy. Cerebellar atrophy is noted. The brainstem and fourth ventricle are within normal limits. The basal ganglia are unremarkable in appearance. The cerebral hemispheres demonstrate grossly normal gray-white differentiation. No mass effect or midline shift is seen. Vascular: No hyperdense vessel or unexpected calcification. Skull: There is no evidence of fracture; visualized osseous structures are unremarkable in appearance. Sinuses/Orbits: The orbits are within normal limits. The paranasal sinuses and mastoid air  cells are well-aerated. Other: No significant soft tissue abnormalities are seen. CT CERVICAL SPINE FINDINGS Alignment: Normal. Skull base and vertebrae: No acute fracture. No primary bone lesion or focal pathologic process. Osseous fusion is noted at C5-C6. The patient is status post decompression at the mid to lower cervical spine. Soft tissues and spinal canal: No prevertebral fluid or swelling. No visible canal hematoma. Disc levels: Multilevel disc space narrowing is noted along the cervical spine, with multilevel vacuum phenomenon. Scattered anterior and posterior disc osteophyte complexes are noted along the cervical spine, with underlying facet disease. Mild degenerative change is noted about the dens. Upper chest: The thyroid gland is unremarkable in appearance. The visualized lung bases are clear. Calcification is noted at the right carotid bifurcation. Other: No additional soft tissue abnormalities are seen. IMPRESSION: 1. No evidence of traumatic intracranial injury or fracture. 2. No evidence of fracture or subluxation along the cervical spine. 3. Moderate cortical volume loss and diffuse small vessel ischemic microangiopathy. 4. Degenerative change along the cervical spine, with chronic osseous fusion at C5-C6, and decompression at the mid to lower cervical spine. 5. Calcification at the right carotid bifurcation. Carotid ultrasound would be helpful for further evaluation, when and as deemed clinically appropriate. Electronically Signed   By: Roanna Raider M.D.   On: 07/24/2016 00:15   Dg Chest Port 1 View  Result Date: 07/16/2016 CLINICAL DATA:  83 year old male with shortness of breath. EXAM: PORTABLE CHEST 1 VIEW COMPARISON:  Chest radiograph dated 07/23/2016 FINDINGS: There is shallow inspiration. The lungs are clear. There is no pleural effusion or pneumothorax. Stable top-normal cardiac size. There is atherosclerotic calcification of the aorta. No acute osseous pathology. IMPRESSION: No  active disease. Electronically Signed   By: Elgie Collard M.D.   On: 07/24/2016 23:21    Microbiology: Recent Results (from the past 240 hour(s))  Blood Culture (routine x 2)     Status: None   Collection Time: 07/20/2016 11:20 PM  Result Value Ref Range Status   Specimen Description BLOOD RIGHT ARM  Final   Special Requests   Final    BOTTLES DRAWN AEROBIC AND ANAEROBIC Blood Culture adequate volume   Culture  NO GROWTH 5 DAYS  Final   Report Status 08/04/2016 FINAL  Final  Blood Culture (routine x 2)     Status: Abnormal   Collection Time: August 21, 2016 11:25 PM  Result Value Ref Range Status   Specimen Description BLOOD RIGHT FOREARM  Final   Special Requests   Final    BOTTLES DRAWN AEROBIC AND ANAEROBIC Blood Culture adequate volume   Culture  Setup Time   Final    GRAM POSITIVE COCCI IN CLUSTERS ANAEROBIC BOTTLE ONLY CRITICAL RESULT CALLED TO, READ BACK BY AND VERIFIED WITH: Patrick North PHARMD 2148 07/30/16 A BROWNING    Culture (A)  Final    STAPHYLOCOCCUS SPECIES (COAGULASE NEGATIVE) THE SIGNIFICANCE OF ISOLATING THIS ORGANISM FROM A SINGLE SET OF BLOOD CULTURES WHEN MULTIPLE SETS ARE DRAWN IS UNCERTAIN. PLEASE NOTIFY THE MICROBIOLOGY DEPARTMENT WITHIN ONE WEEK IF SPECIATION AND SENSITIVITIES ARE REQUIRED.    Report Status 08/01/2016 FINAL  Final  Blood Culture ID Panel (Reflexed)     Status: Abnormal   Collection Time: Aug 21, 2016 11:25 PM  Result Value Ref Range Status   Enterococcus species NOT DETECTED NOT DETECTED Final   Listeria monocytogenes NOT DETECTED NOT DETECTED Final   Staphylococcus species DETECTED (A) NOT DETECTED Final    Comment: Methicillin (oxacillin) susceptible coagulase negative staphylococcus. Possible blood culture contaminant (unless isolated from more than one blood culture draw or clinical case suggests pathogenicity). No antibiotic treatment is indicated for blood  culture contaminants. CRITICAL RESULT CALLED TO, READ BACK BY AND VERIFIED WITH: Patrick North PHARMD 2148 07/30/16 A BROWNING    Staphylococcus aureus NOT DETECTED NOT DETECTED Final   Methicillin resistance NOT DETECTED NOT DETECTED Final   Streptococcus species NOT DETECTED NOT DETECTED Final   Streptococcus agalactiae NOT DETECTED NOT DETECTED Final   Streptococcus pneumoniae NOT DETECTED NOT DETECTED Final   Streptococcus pyogenes NOT DETECTED NOT DETECTED Final   Acinetobacter baumannii NOT DETECTED NOT DETECTED Final   Enterobacteriaceae species NOT DETECTED NOT DETECTED Final   Enterobacter cloacae complex NOT DETECTED NOT DETECTED Final   Escherichia coli NOT DETECTED NOT DETECTED Final   Klebsiella oxytoca NOT DETECTED NOT DETECTED Final   Klebsiella pneumoniae NOT DETECTED NOT DETECTED Final   Proteus species NOT DETECTED NOT DETECTED Final   Serratia marcescens NOT DETECTED NOT DETECTED Final   Haemophilus influenzae NOT DETECTED NOT DETECTED Final   Neisseria meningitidis NOT DETECTED NOT DETECTED Final   Pseudomonas aeruginosa NOT DETECTED NOT DETECTED Final   Candida albicans NOT DETECTED NOT DETECTED Final   Candida glabrata NOT DETECTED NOT DETECTED Final   Candida krusei NOT DETECTED NOT DETECTED Final   Candida parapsilosis NOT DETECTED NOT DETECTED Final   Candida tropicalis NOT DETECTED NOT DETECTED Final  MRSA PCR Screening     Status: Abnormal   Collection Time: 07/30/16  5:48 AM  Result Value Ref Range Status   MRSA by PCR POSITIVE (A) NEGATIVE Final    Comment:        The GeneXpert MRSA Assay (FDA approved for NASAL specimens only), is one component of a comprehensive MRSA colonization surveillance program. It is not intended to diagnose MRSA infection nor to guide or monitor treatment for MRSA infections. RESULT CALLED TO, READ BACK BY AND VERIFIED WITH: J.[facility] RN AT 1610 07/30/16 BY A.DAVIS      Labs: Basic Metabolic Panel: No results for input(s): NA, K, CL, CO2, GLUCOSE, BUN, CREATININE, CALCIUM, MG, PHOS in the last 168  hours. Liver Function Tests: No results  for input(s): AST, ALT, ALKPHOS, BILITOT, PROT, ALBUMIN in the last 168 hours. No results for input(s): LIPASE, AMYLASE in the last 168 hours. No results for input(s): AMMONIA in the last 168 hours. CBC: No results for input(s): WBC, NEUTROABS, HGB, HCT, MCV, PLT in the last 168 hours. Cardiac Enzymes: No results for input(s): CKTOTAL, CKMB, CKMBINDEX, TROPONINI in the last 168 hours. D-Dimer No results for input(s): DDIMER in the last 72 hours. BNP: Invalid input(s): POCBNP CBG: No results for input(s): GLUCAP in the last 168 hours. Anemia work up No results for input(s): VITAMINB12, FOLATE, FERRITIN, TIBC, IRON, RETICCTPCT in the last 72 hours. Urinalysis    Component Value Date/Time   COLORURINE YELLOW 07/23/2016 2243   APPEARANCEUR CLEAR 07/23/2016 2243   LABSPEC 1.016 07/23/2016 2243   PHURINE 5.0 07/23/2016 2243   GLUCOSEU 150 (A) 07/23/2016 2243   HGBUR NEGATIVE 07/23/2016 2243   BILIRUBINUR NEGATIVE 07/23/2016 2243   KETONESUR NEGATIVE 07/23/2016 2243   PROTEINUR 100 (A) 07/23/2016 2243   UROBILINOGEN 0.2 03/14/2009 2303   NITRITE NEGATIVE 07/23/2016 2243   LEUKOCYTESUR NEGATIVE 07/23/2016 2243   Sepsis Labs Invalid input(s): PROCALCITONIN,  WBC,  LACTICIDVEN     SIGNED:  Kathlen Mody, MD  [facility] 08/05/2016, 11:44 PM Pager   If 7PM-7AM, please contact night-coverage [email or web address] Password [email or web address]

## 2018-05-12 IMAGING — CT CT ABD-PELV W/O CM
2 of 4 series · 14 of 46 positions shown, 16 images · non-contrast
Comparison: Abdominal radiograph dated 03/03/2010 and CT dated
02/21/2010

CLINICAL DATA: 86-year-old male with abdominal pain.

EXAM:
CT ABDOMEN AND PELVIS WITHOUT CONTRAST
TECHNIQUE: Multidetector CT imaging of the abdomen and pelvis was performed
following the standard protocol without IV contrast.

[Series 3: abd/ pelvis 5.0 i30f 2 · axial · 0.88mm/px · z∈[-424,+16]mm · 11 of 105 slices shown, 13 images]
[im 9/105  soft-tissue]
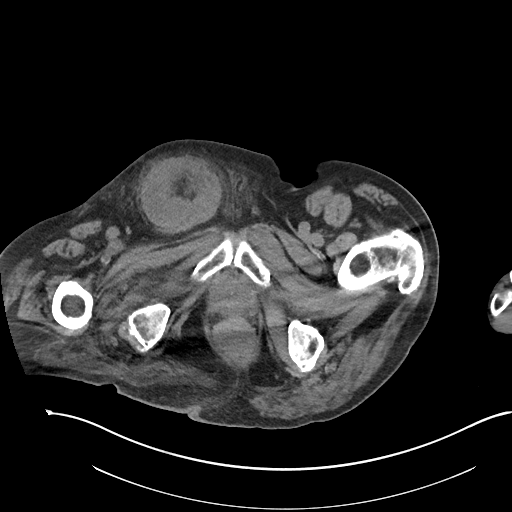
[im 9/105  bone]
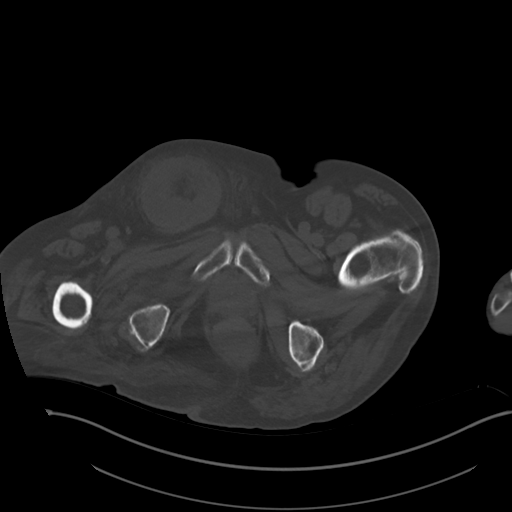
[im 17/105  soft-tissue]
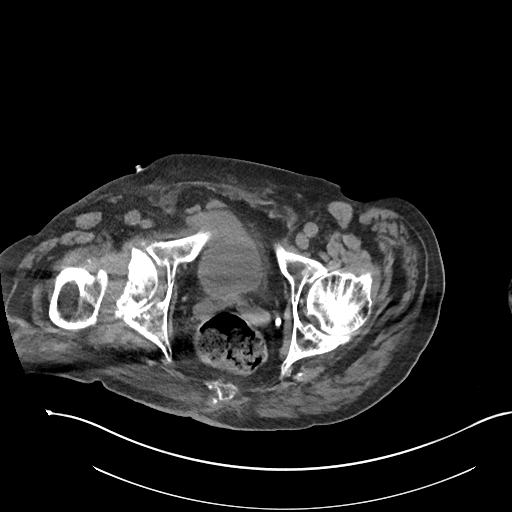
[im 25/105  soft-tissue]
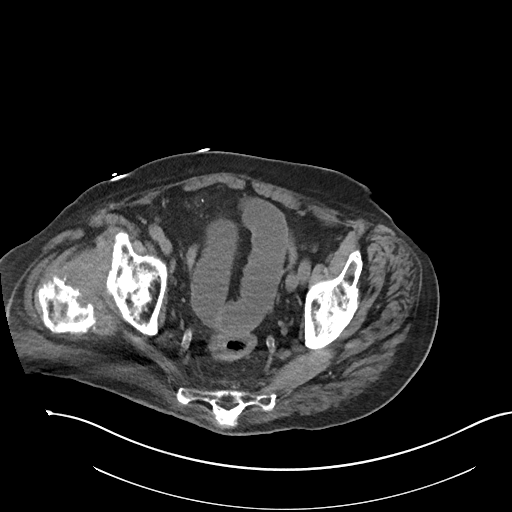
[im 33/105  soft-tissue]
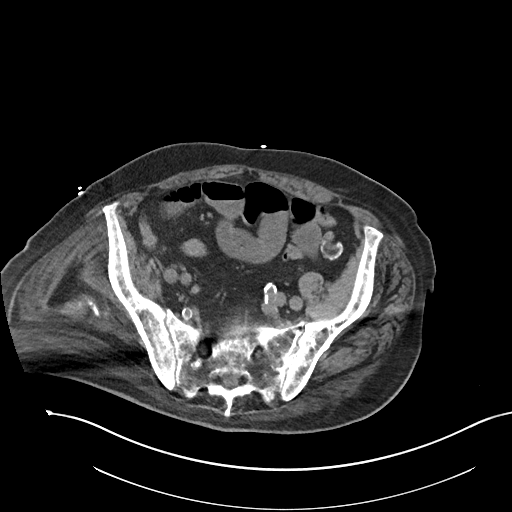
[im 45/105  soft-tissue]
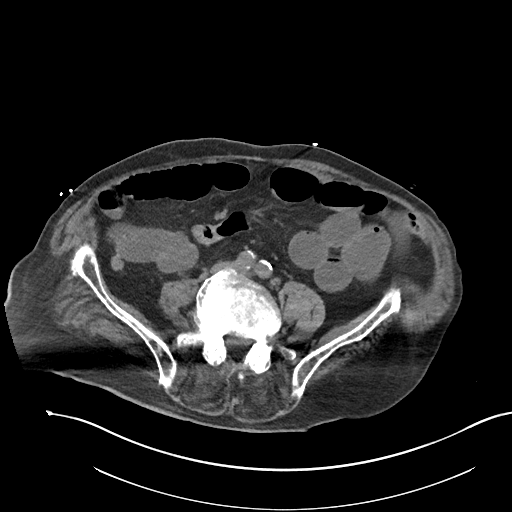
[im 53/105  soft-tissue]
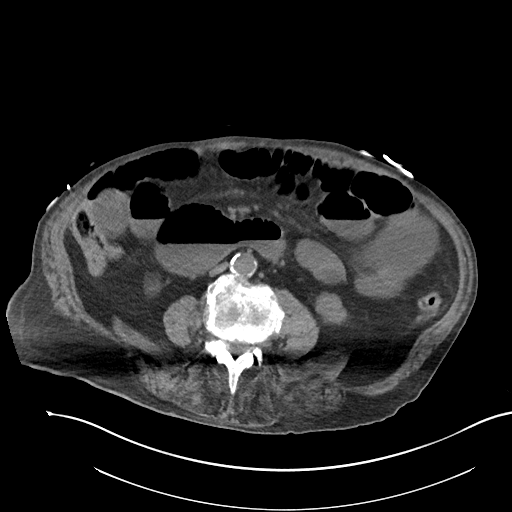
[im 61/105  soft-tissue]
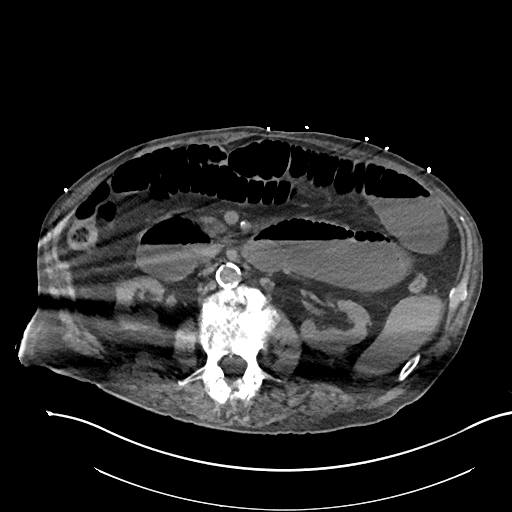
[im 73/105  soft-tissue]
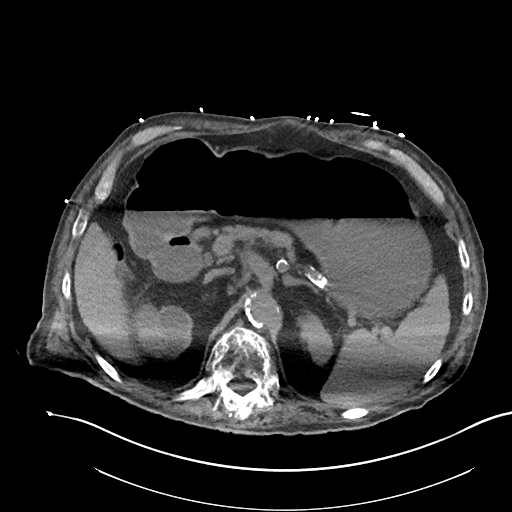
[im 81/105  soft-tissue]
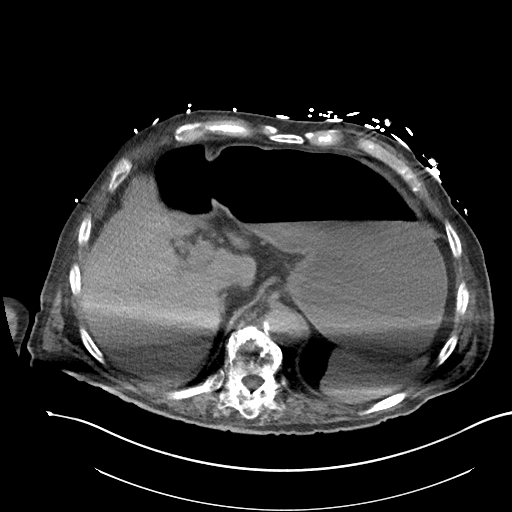
[im 81/105  bone]
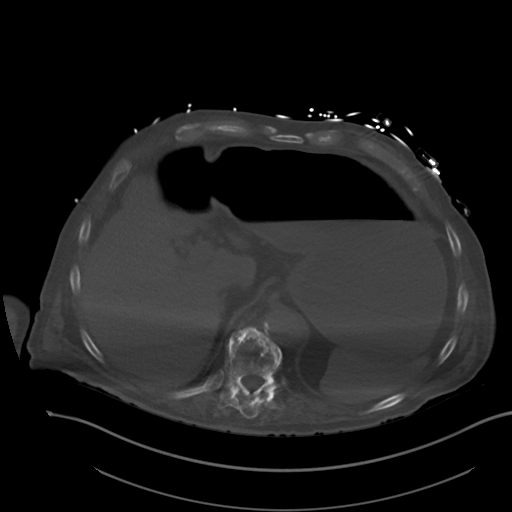
[im 89/105  soft-tissue]
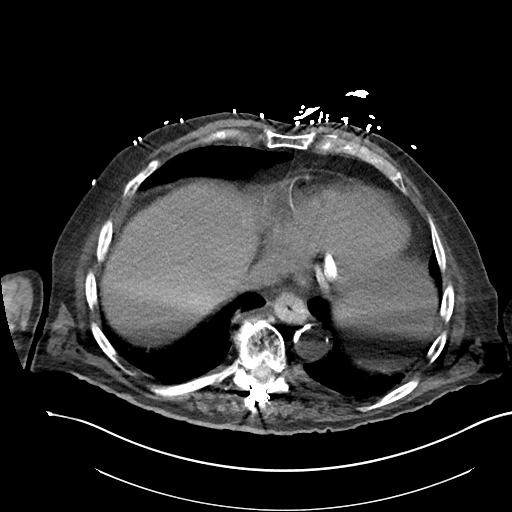
[im 97/105  soft-tissue]
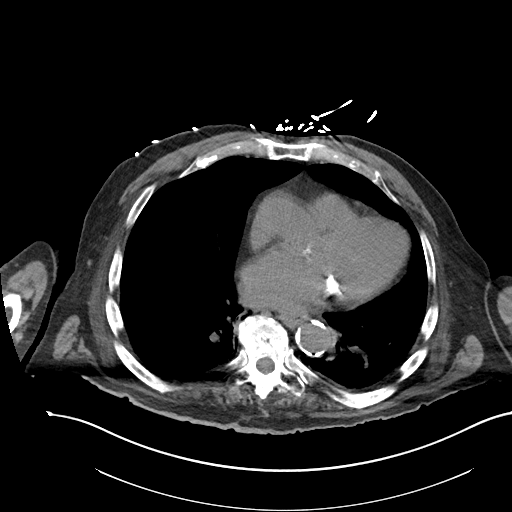

[Series 6: cor st · coronal · 0.88mm/px · 3 of 93 slices shown]
[im 31/93  soft-tissue]
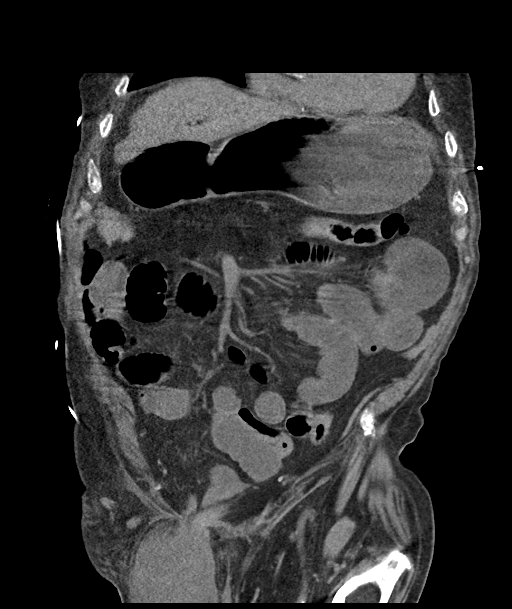
[im 41/93  soft-tissue]
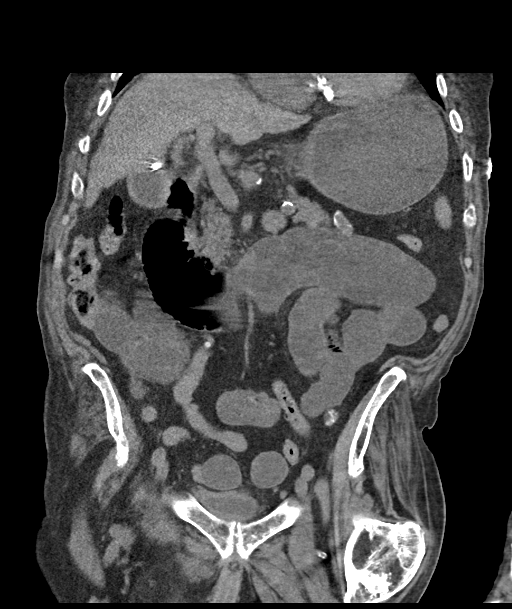
[im 52/93  soft-tissue]
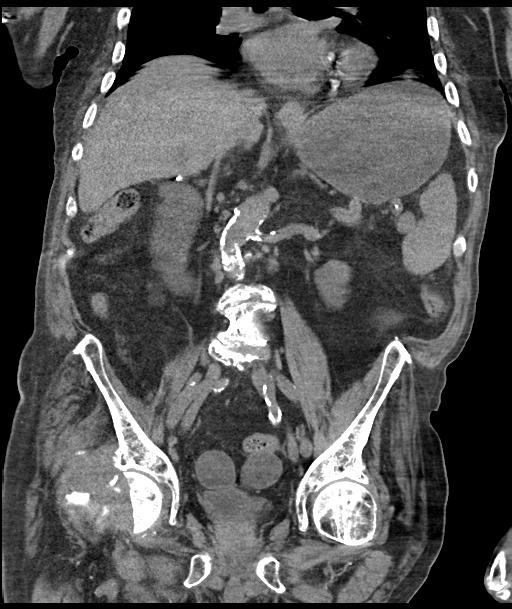

[14 of 46 positions shown; findings below may reference images not displayed]

FINDINGS: Evaluation of this exam is limited in the absence of intravenous
contrast.

Lower chest: There are bibasilar dependent atelectatic changes of
the lungs. The visualized lung bases are otherwise clear. Choose
top-normal cardiac size. Coronary vascular calcification primarily
involving the LAD. There is calcification of the mitral annulus.

There is no intra-abdominal free air or free fluid.

Hepatobiliary: There is slight irregularity of the hepatic contour.
Correlation with clinical exam and liver function tests recommended
to evaluate for cirrhosis. No intrahepatic biliary ductal
dilatation. Cholecystectomy.

Pancreas: Unremarkable. No pancreatic ductal dilatation or
surrounding inflammatory changes.

Spleen: Mildly enlarged measuring up to 15 cm in craniocaudal
length.

Adrenals/Urinary Tract: There is a 2.2 cm hypodense right adrenal
nodule which is not well characterized on today's CT balloon was
present on the CT of 6577 and most likely represents an adenoma. The
left adrenal gland appears unremarkable. There is moderate bilateral
renal parenchymal atrophy. There is no hydronephrosis or
nephrolithiasis on either side. Multiple bilateral renal hypodense
lesions measure up to 2.8 cm in the upper pole of the right kidney.
These lesions are not characterized on this CT but may represent
cysts. Ultrasound or MRI may provide better characterisation. The
visualized ureters appear unremarkable. The bladder is only
partially distended. There is partial herniation of the urinary
bladder into the right inguinal hernia.

Stomach/Bowel: The stomach is distended and contains air and fluid.
There is no evidence of gastric outlet obstruction. Multiple dilated
and fluid-filled loops of small bowel noted measuring up to 5 cm.
There is herniation of a segment of distal small bowel into the
right inguinal canal. There is inflammatory changes and mild
dilatation of the herniated loop of bowel. There is dilatation of
the loops of bowel proximal to the hernia. The distal small bowel
distal to the right inguinal hernia are decompressed. There are
small scattered sigmoid diverticula without active inflammatory
changes. The appendix is normal.

Vascular/Lymphatic: There is advanced aortoiliac atherosclerotic
disease. The aorta is tortuous and ectatic measuring up to 2.3 cm
distally. No portal venous gas identified. There is no adenopathy. A
rim calcified 2.2 x 1.7 cm reniform structure in the left hemipelvis
(series 3, image 72) likely represents a calcified lymph node.

Reproductive: The prostate and seminal vesicles are grossly
unremarkable.

Other: None

Musculoskeletal: There is advanced osteopenia and degenerative
changes of the thoracolumbar spine. Grade 1 L1-L2 retrolisthesis
with disc desiccation and vacuum phenomena at this level. Multilevel
old-appearing compression deformity. L2 laminectomy as well as
multiple posterior metallic interspinous device. There is age
indeterminate, probable old displaced right femoral neck fracture
with nonunion. Clinical correlation is recommended. No definite
acute fracture.
IMPRESSION: 1. Small-bowel obstruction secondary to herniation of an
incarcerated loop of distal small bowel into the right inguinal
canal. Clinical correlation and surgical consult is advised.
2. Partial herniation of the bladder into the right inguinal hernia.
3. Sigmoid diverticulosis without active inflammatory changes.
4. Atherosclerotic disease of the aorta.
5. Osteopenia with extensive degenerative changes of the spine. Age
indeterminate, old-appearing displaced right femoral neck fracture
with nonunion. Clinical correlation is recommended.
